# Patient Record
Sex: Male | Born: 1940 | Race: White | Hispanic: No | Marital: Married | Smoking: Former smoker
Health system: Southern US, Community
[De-identification: ages and names within clinical notes are randomized; demographics above are authoritative.]

## PROBLEM LIST (undated history)

## (undated) DIAGNOSIS — D649 Anemia, unspecified: Secondary | ICD-10-CM

## (undated) DIAGNOSIS — E109 Type 1 diabetes mellitus without complications: Secondary | ICD-10-CM

## (undated) DIAGNOSIS — E11359 Type 2 diabetes mellitus with proliferative diabetic retinopathy without macular edema: Secondary | ICD-10-CM

## (undated) DIAGNOSIS — I739 Peripheral vascular disease, unspecified: Secondary | ICD-10-CM

## (undated) DIAGNOSIS — E785 Hyperlipidemia, unspecified: Secondary | ICD-10-CM

## (undated) DIAGNOSIS — I2510 Atherosclerotic heart disease of native coronary artery without angina pectoris: Secondary | ICD-10-CM

## (undated) DIAGNOSIS — I10 Essential (primary) hypertension: Secondary | ICD-10-CM

## (undated) DIAGNOSIS — N259 Disorder resulting from impaired renal tubular function, unspecified: Secondary | ICD-10-CM

## (undated) HISTORY — DX: Hyperlipidemia, unspecified: E78.5

## (undated) HISTORY — PX: CARDIAC CATHETERIZATION: SHX172

## (undated) HISTORY — DX: Type 2 diabetes mellitus with proliferative diabetic retinopathy without macular edema: E11.359

## (undated) HISTORY — PX: CAROTID ENDARTERECTOMY: SUR193

## (undated) HISTORY — DX: Anemia, unspecified: D64.9

## (undated) HISTORY — DX: Peripheral vascular disease, unspecified: I73.9

## (undated) HISTORY — DX: Essential (primary) hypertension: I10

## (undated) HISTORY — DX: Atherosclerotic heart disease of native coronary artery without angina pectoris: I25.10

## (undated) HISTORY — DX: Type 1 diabetes mellitus without complications: E10.9

## (undated) HISTORY — PX: APPENDECTOMY: SHX54

## (undated) HISTORY — DX: Disorder resulting from impaired renal tubular function, unspecified: N25.9

---

## 1998-03-25 ENCOUNTER — Inpatient Hospital Stay (HOSPITAL_COMMUNITY): Admission: AD | Admit: 1998-03-25 | Discharge: 1998-04-02 | Payer: Self-pay | Admitting: Cardiology

## 1998-05-04 ENCOUNTER — Encounter (HOSPITAL_COMMUNITY): Admission: RE | Admit: 1998-05-04 | Discharge: 1998-08-02 | Payer: Self-pay | Admitting: Cardiovascular Disease

## 1998-05-19 ENCOUNTER — Emergency Department (HOSPITAL_COMMUNITY): Admission: EM | Admit: 1998-05-19 | Discharge: 1998-05-19 | Payer: Self-pay | Admitting: Emergency Medicine

## 1998-05-27 ENCOUNTER — Ambulatory Visit (HOSPITAL_COMMUNITY): Admission: RE | Admit: 1998-05-27 | Discharge: 1998-05-27 | Payer: Self-pay | Admitting: Endocrinology

## 1999-09-05 ENCOUNTER — Encounter: Admission: RE | Admit: 1999-09-05 | Discharge: 1999-12-04 | Payer: Self-pay | Admitting: Endocrinology

## 2000-02-03 ENCOUNTER — Ambulatory Visit (HOSPITAL_COMMUNITY): Admission: RE | Admit: 2000-02-03 | Discharge: 2000-02-03 | Payer: Self-pay | Admitting: Ophthalmology

## 2000-05-23 ENCOUNTER — Emergency Department (HOSPITAL_COMMUNITY): Admission: EM | Admit: 2000-05-23 | Discharge: 2000-05-23 | Payer: Self-pay | Admitting: Emergency Medicine

## 2000-06-01 ENCOUNTER — Encounter: Payer: Self-pay | Admitting: Endocrinology

## 2000-06-01 ENCOUNTER — Ambulatory Visit (HOSPITAL_COMMUNITY): Admission: RE | Admit: 2000-06-01 | Discharge: 2000-06-01 | Payer: Self-pay | Admitting: Endocrinology

## 2001-12-05 ENCOUNTER — Encounter: Payer: Self-pay | Admitting: Ophthalmology

## 2001-12-06 ENCOUNTER — Ambulatory Visit (HOSPITAL_COMMUNITY): Admission: RE | Admit: 2001-12-06 | Discharge: 2001-12-06 | Payer: Self-pay | Admitting: Ophthalmology

## 2003-01-07 ENCOUNTER — Encounter: Payer: Self-pay | Admitting: Nephrology

## 2003-01-07 ENCOUNTER — Inpatient Hospital Stay (HOSPITAL_COMMUNITY): Admission: EM | Admit: 2003-01-07 | Discharge: 2003-01-15 | Payer: Self-pay | Admitting: *Deleted

## 2003-01-08 ENCOUNTER — Encounter: Payer: Self-pay | Admitting: Nephrology

## 2003-01-14 ENCOUNTER — Encounter: Payer: Self-pay | Admitting: Nephrology

## 2003-01-14 ENCOUNTER — Encounter: Payer: Self-pay | Admitting: Endocrinology

## 2003-01-15 ENCOUNTER — Encounter: Payer: Self-pay | Admitting: Endocrinology

## 2004-10-26 ENCOUNTER — Ambulatory Visit: Payer: Self-pay | Admitting: Oncology

## 2004-12-06 ENCOUNTER — Ambulatory Visit: Payer: Self-pay | Admitting: Endocrinology

## 2004-12-13 ENCOUNTER — Ambulatory Visit: Payer: Self-pay | Admitting: Endocrinology

## 2004-12-28 ENCOUNTER — Ambulatory Visit: Payer: Self-pay | Admitting: Oncology

## 2005-02-18 ENCOUNTER — Emergency Department (HOSPITAL_COMMUNITY): Admission: EM | Admit: 2005-02-18 | Discharge: 2005-02-18 | Payer: Self-pay | Admitting: Emergency Medicine

## 2005-02-27 ENCOUNTER — Ambulatory Visit: Payer: Self-pay | Admitting: Oncology

## 2005-06-02 ENCOUNTER — Ambulatory Visit: Payer: Self-pay | Admitting: Oncology

## 2005-07-12 ENCOUNTER — Ambulatory Visit: Payer: Self-pay | Admitting: Endocrinology

## 2005-07-19 ENCOUNTER — Ambulatory Visit: Payer: Self-pay | Admitting: Endocrinology

## 2005-08-17 ENCOUNTER — Ambulatory Visit: Payer: Self-pay | Admitting: Endocrinology

## 2005-09-01 ENCOUNTER — Ambulatory Visit: Payer: Self-pay | Admitting: Oncology

## 2005-11-06 ENCOUNTER — Ambulatory Visit: Payer: Self-pay | Admitting: Endocrinology

## 2005-11-08 ENCOUNTER — Ambulatory Visit: Payer: Self-pay | Admitting: Endocrinology

## 2005-11-29 ENCOUNTER — Ambulatory Visit: Payer: Self-pay | Admitting: Endocrinology

## 2006-01-05 ENCOUNTER — Ambulatory Visit: Payer: Self-pay | Admitting: Endocrinology

## 2006-02-20 ENCOUNTER — Ambulatory Visit: Payer: Self-pay | Admitting: Endocrinology

## 2006-02-21 ENCOUNTER — Encounter (INDEPENDENT_AMBULATORY_CARE_PROVIDER_SITE_OTHER): Payer: Self-pay | Admitting: *Deleted

## 2006-02-21 ENCOUNTER — Ambulatory Visit: Payer: Self-pay | Admitting: Internal Medicine

## 2006-02-21 ENCOUNTER — Inpatient Hospital Stay (HOSPITAL_COMMUNITY): Admission: RE | Admit: 2006-02-21 | Discharge: 2006-02-22 | Payer: Self-pay | Admitting: Otolaryngology

## 2006-03-22 ENCOUNTER — Ambulatory Visit: Payer: Self-pay | Admitting: Endocrinology

## 2006-05-15 ENCOUNTER — Ambulatory Visit: Payer: Self-pay | Admitting: Endocrinology

## 2006-07-18 ENCOUNTER — Ambulatory Visit: Payer: Self-pay | Admitting: Endocrinology

## 2006-07-24 ENCOUNTER — Ambulatory Visit: Payer: Self-pay | Admitting: Endocrinology

## 2006-11-08 ENCOUNTER — Ambulatory Visit: Payer: Self-pay | Admitting: Endocrinology

## 2006-11-13 ENCOUNTER — Ambulatory Visit: Payer: Self-pay | Admitting: Endocrinology

## 2006-12-26 ENCOUNTER — Ambulatory Visit: Payer: Self-pay | Admitting: Endocrinology

## 2007-01-24 ENCOUNTER — Ambulatory Visit (HOSPITAL_COMMUNITY): Admission: RE | Admit: 2007-01-24 | Discharge: 2007-01-24 | Payer: Self-pay | Admitting: Internal Medicine

## 2007-01-30 ENCOUNTER — Ambulatory Visit: Payer: Self-pay | Admitting: Vascular Surgery

## 2007-02-05 ENCOUNTER — Ambulatory Visit: Payer: Self-pay | Admitting: Endocrinology

## 2007-04-08 ENCOUNTER — Ambulatory Visit: Payer: Self-pay | Admitting: Endocrinology

## 2007-07-10 ENCOUNTER — Encounter: Payer: Self-pay | Admitting: Endocrinology

## 2007-07-10 DIAGNOSIS — I10 Essential (primary) hypertension: Secondary | ICD-10-CM

## 2007-07-10 DIAGNOSIS — I2510 Atherosclerotic heart disease of native coronary artery without angina pectoris: Secondary | ICD-10-CM

## 2007-07-10 DIAGNOSIS — E109 Type 1 diabetes mellitus without complications: Secondary | ICD-10-CM

## 2007-07-10 DIAGNOSIS — E785 Hyperlipidemia, unspecified: Secondary | ICD-10-CM

## 2007-07-10 DIAGNOSIS — D649 Anemia, unspecified: Secondary | ICD-10-CM

## 2007-07-10 DIAGNOSIS — N259 Disorder resulting from impaired renal tubular function, unspecified: Secondary | ICD-10-CM

## 2007-07-10 HISTORY — DX: Atherosclerotic heart disease of native coronary artery without angina pectoris: I25.10

## 2007-07-10 HISTORY — DX: Hyperlipidemia, unspecified: E78.5

## 2007-07-10 HISTORY — DX: Anemia, unspecified: D64.9

## 2007-07-10 HISTORY — DX: Disorder resulting from impaired renal tubular function, unspecified: N25.9

## 2007-07-10 HISTORY — DX: Essential (primary) hypertension: I10

## 2007-07-10 HISTORY — DX: Type 1 diabetes mellitus without complications: E10.9

## 2007-08-08 ENCOUNTER — Ambulatory Visit: Payer: Self-pay | Admitting: Endocrinology

## 2007-08-08 LAB — CONVERTED CEMR LAB
ALT: 37 units/L (ref 0–53)
AST: 34 units/L (ref 0–37)
Albumin: 3.9 g/dL (ref 3.5–5.2)
Alkaline Phosphatase: 73 units/L (ref 39–117)
Bilirubin, Direct: 0.2 mg/dL (ref 0.0–0.3)
LDL Cholesterol: 53 mg/dL (ref 0–99)
Total Bilirubin: 1 mg/dL (ref 0.3–1.2)
Total Protein: 7.3 g/dL (ref 6.0–8.3)
Triglycerides: 60 mg/dL (ref 0–149)
VLDL: 12 mg/dL (ref 0–40)

## 2007-11-07 ENCOUNTER — Ambulatory Visit: Payer: Self-pay | Admitting: Endocrinology

## 2007-11-07 LAB — CONVERTED CEMR LAB
Basophils Relative: 0.8 % (ref 0.0–1.0)
CO2: 29 meq/L (ref 19–32)
Creatinine, Ser: 1.1 mg/dL (ref 0.4–1.5)
GFR calc non Af Amer: 71 mL/min
Glucose, Bld: 253 mg/dL — ABNORMAL HIGH (ref 70–99)
Hgb A1c MFr Bld: 9.3 % — ABNORMAL HIGH (ref 4.6–6.0)
Lymphocytes Relative: 30.7 % (ref 12.0–46.0)
MCHC: 34.9 g/dL (ref 30.0–36.0)
Monocytes Absolute: 0.6 10*3/uL (ref 0.2–0.7)
Monocytes Relative: 9.7 % (ref 3.0–11.0)
Neutro Abs: 3.6 10*3/uL (ref 1.4–7.7)
Platelets: 238 10*3/uL (ref 150–400)
RDW: 12.4 % (ref 11.5–14.6)
Sodium: 140 meq/L (ref 135–145)
Uric Acid, Serum: 5 mg/dL (ref 2.4–7.0)

## 2007-11-27 ENCOUNTER — Encounter: Payer: Self-pay | Admitting: Endocrinology

## 2008-01-15 ENCOUNTER — Encounter: Payer: Self-pay | Admitting: Endocrinology

## 2008-02-06 ENCOUNTER — Ambulatory Visit: Payer: Self-pay | Admitting: Endocrinology

## 2008-02-07 ENCOUNTER — Encounter: Payer: Self-pay | Admitting: Endocrinology

## 2008-02-12 ENCOUNTER — Encounter (INDEPENDENT_AMBULATORY_CARE_PROVIDER_SITE_OTHER): Payer: Self-pay | Admitting: Otolaryngology

## 2008-02-12 ENCOUNTER — Ambulatory Visit (HOSPITAL_COMMUNITY): Admission: RE | Admit: 2008-02-12 | Discharge: 2008-02-14 | Payer: Self-pay | Admitting: Otolaryngology

## 2008-02-18 ENCOUNTER — Encounter: Payer: Self-pay | Admitting: Endocrinology

## 2008-03-17 ENCOUNTER — Encounter: Payer: Self-pay | Admitting: Endocrinology

## 2008-04-28 ENCOUNTER — Encounter: Payer: Self-pay | Admitting: Endocrinology

## 2008-06-04 ENCOUNTER — Encounter: Payer: Self-pay | Admitting: Endocrinology

## 2008-06-09 ENCOUNTER — Encounter: Payer: Self-pay | Admitting: Endocrinology

## 2008-06-11 ENCOUNTER — Ambulatory Visit: Payer: Self-pay | Admitting: Endocrinology

## 2008-06-11 LAB — CONVERTED CEMR LAB: Hgb A1c MFr Bld: 9.5 % — ABNORMAL HIGH (ref 4.6–6.0)

## 2008-06-12 ENCOUNTER — Encounter: Payer: Self-pay | Admitting: Endocrinology

## 2008-06-23 ENCOUNTER — Encounter: Payer: Self-pay | Admitting: Endocrinology

## 2008-07-01 ENCOUNTER — Telehealth: Payer: Self-pay | Admitting: Endocrinology

## 2008-07-06 ENCOUNTER — Encounter: Payer: Self-pay | Admitting: Endocrinology

## 2008-07-17 ENCOUNTER — Encounter: Payer: Self-pay | Admitting: Endocrinology

## 2008-07-20 ENCOUNTER — Encounter: Payer: Self-pay | Admitting: Endocrinology

## 2008-07-22 ENCOUNTER — Encounter: Payer: Self-pay | Admitting: Internal Medicine

## 2008-07-27 ENCOUNTER — Encounter: Payer: Self-pay | Admitting: Endocrinology

## 2008-07-31 ENCOUNTER — Encounter: Payer: Self-pay | Admitting: Endocrinology

## 2008-09-09 ENCOUNTER — Ambulatory Visit: Payer: Self-pay | Admitting: Endocrinology

## 2008-09-16 ENCOUNTER — Encounter: Payer: Self-pay | Admitting: Endocrinology

## 2008-11-12 ENCOUNTER — Ambulatory Visit: Payer: Self-pay | Admitting: Endocrinology

## 2008-11-12 LAB — CONVERTED CEMR LAB
Hgb A1c MFr Bld: 8.6 % — ABNORMAL HIGH (ref 4.6–6.0)
TSH: 2.5 microintl units/mL (ref 0.35–5.50)

## 2008-12-02 ENCOUNTER — Encounter: Payer: Self-pay | Admitting: Endocrinology

## 2008-12-29 ENCOUNTER — Encounter: Payer: Self-pay | Admitting: Endocrinology

## 2009-01-04 ENCOUNTER — Encounter: Payer: Self-pay | Admitting: Endocrinology

## 2009-01-07 ENCOUNTER — Encounter: Payer: Self-pay | Admitting: Endocrinology

## 2009-01-07 ENCOUNTER — Telehealth: Payer: Self-pay | Admitting: Endocrinology

## 2009-02-04 ENCOUNTER — Encounter: Payer: Self-pay | Admitting: Endocrinology

## 2009-02-16 ENCOUNTER — Ambulatory Visit: Payer: Self-pay | Admitting: Endocrinology

## 2009-02-16 LAB — CONVERTED CEMR LAB: Hgb A1c MFr Bld: 9 % — ABNORMAL HIGH (ref 4.6–6.0)

## 2009-02-17 ENCOUNTER — Telehealth (INDEPENDENT_AMBULATORY_CARE_PROVIDER_SITE_OTHER): Payer: Self-pay | Admitting: *Deleted

## 2009-03-15 ENCOUNTER — Encounter: Payer: Self-pay | Admitting: Endocrinology

## 2009-03-17 ENCOUNTER — Telehealth (INDEPENDENT_AMBULATORY_CARE_PROVIDER_SITE_OTHER): Payer: Self-pay | Admitting: *Deleted

## 2009-04-07 ENCOUNTER — Encounter: Payer: Self-pay | Admitting: Endocrinology

## 2009-04-13 ENCOUNTER — Encounter: Payer: Self-pay | Admitting: Endocrinology

## 2009-04-14 ENCOUNTER — Encounter: Payer: Self-pay | Admitting: Endocrinology

## 2009-04-23 ENCOUNTER — Encounter: Payer: Self-pay | Admitting: Endocrinology

## 2009-04-23 ENCOUNTER — Telehealth (INDEPENDENT_AMBULATORY_CARE_PROVIDER_SITE_OTHER): Payer: Self-pay | Admitting: *Deleted

## 2009-06-02 ENCOUNTER — Ambulatory Visit: Payer: Self-pay | Admitting: Endocrinology

## 2009-08-04 ENCOUNTER — Encounter: Payer: Self-pay | Admitting: Endocrinology

## 2009-08-31 ENCOUNTER — Ambulatory Visit: Payer: Self-pay | Admitting: Endocrinology

## 2009-08-31 LAB — CONVERTED CEMR LAB
Cholesterol: 141 mg/dL (ref 0–200)
LDL Cholesterol: 96 mg/dL (ref 0–99)
Total CHOL/HDL Ratio: 4
Triglycerides: 59 mg/dL (ref 0.0–149.0)

## 2009-10-11 ENCOUNTER — Encounter: Payer: Self-pay | Admitting: Endocrinology

## 2009-11-08 ENCOUNTER — Encounter: Payer: Self-pay | Admitting: Endocrinology

## 2009-11-15 ENCOUNTER — Encounter: Payer: Self-pay | Admitting: Endocrinology

## 2009-11-24 ENCOUNTER — Telehealth (INDEPENDENT_AMBULATORY_CARE_PROVIDER_SITE_OTHER): Payer: Self-pay | Admitting: *Deleted

## 2009-11-30 ENCOUNTER — Ambulatory Visit: Payer: Self-pay | Admitting: Endocrinology

## 2009-11-30 LAB — CONVERTED CEMR LAB
AST: 40 units/L — ABNORMAL HIGH (ref 0–37)
Alkaline Phosphatase: 73 units/L (ref 39–117)
Basophils Absolute: 0.1 10*3/uL (ref 0.0–0.1)
Basophils Relative: 0.9 % (ref 0.0–3.0)
HDL: 40 mg/dL (ref >39.00)
Hemoglobin: 11.6 g/dL — ABNORMAL LOW (ref 13.0–17.0)
Hgb A1c MFr Bld: 8.7 % — ABNORMAL HIGH (ref 4.6–6.5)
Iron: 116 ug/dL (ref 42–165)
LDL Cholesterol: 91 mg/dL (ref 0–99)
Lymphs Abs: 1.9 10*3/uL (ref 0.7–4.0)
MCHC: 33.7 g/dL (ref 30.0–36.0)
Monocytes Relative: 10.1 % (ref 3.0–12.0)
Neutro Abs: 3.3 10*3/uL (ref 1.4–7.7)
Neutrophils Relative %: 54.7 % (ref 43.0–77.0)
RBC: 3.67 M/uL — ABNORMAL LOW (ref 4.22–5.81)
TSH: 2.75 microintl units/mL (ref 0.35–5.50)
Total Bilirubin: 0.9 mg/dL (ref 0.3–1.2)
Transferrin: 243.6 mg/dL (ref 212.0–360.0)
VLDL: 24.2 mg/dL (ref 0.0–40.0)

## 2010-01-03 ENCOUNTER — Encounter: Payer: Self-pay | Admitting: Endocrinology

## 2010-01-05 ENCOUNTER — Telehealth (INDEPENDENT_AMBULATORY_CARE_PROVIDER_SITE_OTHER): Payer: Self-pay | Admitting: *Deleted

## 2010-02-22 HISTORY — PX: CORONARY ARTERY BYPASS GRAFT: SHX141

## 2010-02-27 ENCOUNTER — Inpatient Hospital Stay (HOSPITAL_COMMUNITY): Admission: EM | Admit: 2010-02-27 | Discharge: 2010-03-02 | Payer: Self-pay | Admitting: Cardiovascular Disease

## 2010-02-27 ENCOUNTER — Encounter: Payer: Self-pay | Admitting: Emergency Medicine

## 2010-02-27 ENCOUNTER — Ambulatory Visit: Payer: Self-pay | Admitting: Diagnostic Radiology

## 2010-03-01 ENCOUNTER — Encounter (INDEPENDENT_AMBULATORY_CARE_PROVIDER_SITE_OTHER): Payer: Self-pay | Admitting: Cardiovascular Disease

## 2010-03-07 ENCOUNTER — Ambulatory Visit: Payer: Self-pay | Admitting: Endocrinology

## 2010-03-17 ENCOUNTER — Inpatient Hospital Stay (HOSPITAL_COMMUNITY): Admission: RE | Admit: 2010-03-17 | Discharge: 2010-03-18 | Payer: Self-pay | Admitting: Cardiovascular Disease

## 2010-03-30 ENCOUNTER — Encounter: Payer: Self-pay | Admitting: Endocrinology

## 2010-04-06 ENCOUNTER — Encounter (INDEPENDENT_AMBULATORY_CARE_PROVIDER_SITE_OTHER): Payer: Self-pay | Admitting: *Deleted

## 2010-04-06 LAB — CONVERTED CEMR LAB
BUN: 29 mg/dL
Calcium: 9 mg/dL
Creatinine, Ser: 1.13 mg/dL
GFR calc non Af Amer: >60 mL/min
HCT: 32.9 %
Hemoglobin: 10.8 g/dL
WBC: 5.6 10*3/uL

## 2010-04-11 ENCOUNTER — Encounter: Payer: Self-pay | Admitting: Endocrinology

## 2010-04-21 ENCOUNTER — Encounter (INDEPENDENT_AMBULATORY_CARE_PROVIDER_SITE_OTHER): Payer: Self-pay | Admitting: *Deleted

## 2010-05-05 ENCOUNTER — Ambulatory Visit: Payer: Self-pay | Admitting: Endocrinology

## 2010-05-05 DIAGNOSIS — E113599 Type 2 diabetes mellitus with proliferative diabetic retinopathy without macular edema, unspecified eye: Secondary | ICD-10-CM | POA: Insufficient documentation

## 2010-05-05 DIAGNOSIS — E11359 Type 2 diabetes mellitus with proliferative diabetic retinopathy without macular edema: Secondary | ICD-10-CM

## 2010-05-05 HISTORY — DX: Type 2 diabetes mellitus with proliferative diabetic retinopathy without macular edema: E11.359

## 2010-06-03 ENCOUNTER — Telehealth: Payer: Self-pay | Admitting: Endocrinology

## 2010-07-07 ENCOUNTER — Ambulatory Visit: Payer: Self-pay | Admitting: Endocrinology

## 2010-08-08 ENCOUNTER — Encounter: Payer: Self-pay | Admitting: Endocrinology

## 2010-08-31 ENCOUNTER — Encounter: Payer: Self-pay | Admitting: Endocrinology

## 2010-09-02 ENCOUNTER — Telehealth: Payer: Self-pay | Admitting: Endocrinology

## 2010-10-06 ENCOUNTER — Ambulatory Visit: Payer: Self-pay | Admitting: Endocrinology

## 2010-10-13 ENCOUNTER — Encounter: Payer: Self-pay | Admitting: Endocrinology

## 2010-10-28 ENCOUNTER — Encounter: Payer: Self-pay | Admitting: Endocrinology

## 2010-10-31 ENCOUNTER — Encounter: Payer: Self-pay | Admitting: Endocrinology

## 2010-11-28 ENCOUNTER — Encounter: Payer: Self-pay | Admitting: Endocrinology

## 2010-11-30 ENCOUNTER — Ambulatory Visit: Payer: Self-pay | Admitting: Endocrinology

## 2011-01-24 NOTE — Letter (Signed)
Summary: [facility]  [facility]   Imported By: Lennie Odor 11/03/2010 11:29:55  _____________________________________________________________________  External Attachment:    Type:   Image     Comment:   External Document

## 2011-01-24 NOTE — Letter (Signed)
Summary: [facility]  [facility]   Imported By: Sherian Rein 09/26/2010 14:46:14  _____________________________________________________________________  External Attachment:    Type:   Image     Comment:   External Document

## 2011-01-24 NOTE — Progress Notes (Signed)
Summary: Refill--Insulin  Phone Note Call from Patient Call back at Home Phone [phone]   Summary of Call: Patient left message on triage requesting a call back. I spoke with patient and he is out of insulin. I made patient aware that prescription was sent, but we will send again. Per patient this prescription should go to [facility]. Initial call taken by: Lucious Groves,  June 03, 2010 2:22 PM    Prescriptions: HUMALOG 100 UNIT/ML  SOLN (INSULIN LISPRO (HUMAN)) inject tid (qac) 7 units breakfast, 6 lunch, 12 dinner  #10 [place] x 11   Entered by:   Lucious Groves   Authorized by:   Minus Breeding MD   Signed by:   Lucious Groves on 06/03/2010   Method used:   Faxed to ...       [facility] [address] [phone]* (retail)       [address]       [place], [place]  [address]       Ph: [phone]       Fax: [phone]   RxID:   [id number]

## 2011-01-24 NOTE — Assessment & Plan Note (Signed)
Summary: 2 mth fu   stc   Vital Signs:  Patient profile:   70 year old male Height:      70 inches (177.80 cm) Weight:      162.38 pounds (73.81 kg) BMI:     23.38 O2 Sat:      98 % on Room air Temp:     97.9 degrees F (36.61 degrees C) oral Pulse rate:   59 / minute Pulse rhythm:   regular BP sitting:   128 / 68  (left arm) Cuff size:   regular  Vitals Entered By: Brenton Grills MA (July 07, 2010 8:14 AM)  O2 Flow:  Room air CC: 2 month f/u/Pt is no longer taking Pantoprazole Sodium/aj   CC:  2 month f/u/Pt is no longer taking Pantoprazole Sodium/aj.  History of Present Illness: pt states he feels well in general.  he brings a record of his cbg's which i have reviewed today.  almost all are in the 100's.  he has some cbg's below at all times of day, except for hs.  he takes only 6 units of humalog with breakfast if he is going to be active.    Current Medications (verified): 1)  Lantus 100 Unit/ml  Soln (Insulin Glargine) .... 21 Units At Night 2)  Folbee Plus   Tabs (B Complex-C-Folic Acid) .... Take 1 By Mouth Qd 3)  Prilosec 20 Mg  Cpdr (Omeprazole) .... Take 1 By Mouth Qd 4)  A1c 250.53 5)  Humalog 100 Unit/ml  Soln (Insulin Lispro (Human)) .... Inject Tid (Qac) 7 Units Breakfast, 6 Lunch, 12 Dinner 6)  Coreg 25 Mg Tabs (Carvedilol) .... Take 1 Two Times A Day 7)  Hydralazine Hcl 25 Mg Tabs (Hydralazine Hcl) .... Take 3 By Mouth Qd 8)  Clonidine Hcl 0.1 Mg Tabs (Clonidine Hcl) .... Take 4 By Mouth Two Times A Day 9)  Vitamin D 1000 Unit  Caps (Ergocalciferol) .... Take 2 Daily 10)  Isosorbide Mononitrate Cr 60 Mg Xr24h-Tab .Marland Kitchen.. 1 Tablet Daily 11)  Pantoprazole Sodium 40 Mg Tbec (Pantoprazole Sodium) .Marland Kitchen.. 1 Tab Daily 12)  Aspirin 81 Mg Tbec (Aspirin) .Marland Kitchen.. 1 Tab Daily  Allergies (verified): 1)  ! Penicillin 2)  ! Codeine 3)  ! Diovan 4)  ! Norvasc  Past History:  Past Medical History: Last updated: 07/10/2007 Anemia-NOS Coronary artery disease Diabetes  mellitus, type I Hyperlipidemia Hypertension Renal insufficiency DM Retinopathy  Review of Systems  The patient denies hypoglycemia.    Physical Exam  General:  normal appearance.   Skin:  insulin injection sites at anterior abdomen are normal    Impression & Recommendations:  Problem # 1:  DIABETES MELLITUS, TYPE I (ICD-250.01) considering duration of disease, age, and comorbidity, he is doing very well.  Medications Added to Medication List This Visit: 1)  Humalog 100 Unit/ml Soln (Insulin lispro (human)) .... Inject tid (qac) 7 units breakfast, 6 lunch, 13 dinner 2)  Aspirin 81 Mg Tbec (Aspirin) .Marland Kitchen.. 1 tab daily  Other Orders: TLB-A1C / Hgb A1C (Glycohemoglobin) (83036-A1C) Est. Patient Level III ([address])  Patient Instructions: 1)  blood tests are being ordered for you today.  please call [phone] to hear your test results. 2)  pending the test results, please continue lantus 21 units at night, and increase humalog (just before each meal) 06-29-12 3)  if exercise is anticipated, subtract 2 units humalog 4)  if 200-249, add 1 unit humalog.  if 250 or over, take 2 extra units.  5)  Please schedule a medicare "wellness" appointment in 1 month. 6)  check your blood sugar 6 times a day--before the 3 meals, at bedtime, and 2 other random times per day.  also check if you have symptoms of your blood sugar being too high or too low.  please keep a record of the readings and bring it to your next appointment here.  please call us sooner if you are having low blood sugar episodes. 7)  Please schedule a follow-up appointment in 3 months.  Appended Document: 2 mth fu   stc should read "below 100 at all times"

## 2011-01-24 NOTE — Assessment & Plan Note (Signed)
Summary: 3 MTH FU  STC   Vital Signs:  Patient profile:   70 year old male Height:      70 inches (177.80 cm) Weight:      158.50 pounds (72.05 kg) BMI:     22.82 O2 Sat:      99 % on Room air Temp:     98.0 degrees F (36.67 degrees C) oral Pulse rate:   58 / minute BP sitting:   118 / 56  (left arm) Cuff size:   regular  Vitals Entered By: Brenton Grills MA (October 06, 2010 8:07 AM)  O2 Flow:  Room air CC: 3 month F/U/pt is no longer taking Hydralazine/aj Is Patient Diabetic? Yes   CC:  3 month F/U/pt is no longer taking Hydralazine/aj.  History of Present Illness: pt states he feels well in general.  he brings an extensive record of his cbg's which i have reviewed today.  it varies from approx 80-240.  it is highest at hs, then supper, then am, then lowest before lunch.    Current Medications (verified): 1)  Lantus 100 Unit/ml  Soln (Insulin Glargine) .... 21 Units At Night 2)  Folbee Plus   Tabs (B Complex-C-Folic Acid) .... Take 1 By Mouth Qd 3)  Prilosec 20 Mg  Cpdr (Omeprazole) .... Take 1 By Mouth Qd 4)  A1c 250.53 5)  Humalog 100 Unit/ml  Soln (Insulin Lispro (Human)) .... Inject Tid (Qac) 7 Units Breakfast, 6 Lunch, 13 Dinner 6)  Coreg 25 Mg Tabs (Carvedilol) .... Take 1 Two Times A Day 7)  Hydralazine Hcl 25 Mg Tabs (Hydralazine Hcl) .... Take 3 By Mouth Qd 8)  Clonidine Hcl 0.1 Mg Tabs (Clonidine Hcl) .... Take 1 By Mouth Once Daily 9)  Vitamin D 2000 Unit Caps (Cholecalciferol) .... Take 2 By Mouth Daily 10)  Isosorbide Mononitrate Cr 60 Mg Xr24h-Tab .Marland Kitchen.. 1 Tablet Daily 11)  Aspirin 81 Mg Tbec (Aspirin) .Marland Kitchen.. 1 Tab Daily 12)  Hydrochlorothiazide 25 Mg Tabs (Hydrochlorothiazide) .Marland Kitchen.. 1 By Mouth Once Daily  Allergies (verified): 1)  ! Penicillin 2)  ! Codeine 3)  ! Diovan 4)  ! Norvasc  Past History:  Past Medical History: Last updated: 07/10/2007 Anemia-NOS Coronary artery disease Diabetes mellitus, type I Hyperlipidemia Hypertension Renal  insufficiency DM Retinopathy  Social History: Reviewed history from 05/05/2010 and no changes required. retired married non-smoker no alcohol no illicit drugs says his diet and exercise are very good  Review of Systems  The patient denies hypoglycemia.    Physical Exam  General:  normal appearance.   Pulses:  dorsalis pedis intact bilat. Extremities:  no deformity.  no ulcer on the feet.  feet are of normal color and temp.   trace right pedal edema and trace left pedal edema.  there is a healed vein harvest scar at the left leg and thigh  Neurologic:  cn 2-12 grossly intact.   readily moves all 4's.   sensation is intact to touch on the feet  Additional Exam:  Hemoglobin A1C       [H]  9.7 %    Impression & Recommendations:  Problem # 1:  DIABETES MELLITUS, TYPE I (ICD-250.01) needs increased rx  Medications Added to Medication List This Visit: 1)  Lantus 100 Unit/ml Soln (Insulin glargine) .... 20 units at night 2)  Humalog 100 Unit/ml Soln (Insulin lispro (human)) .... Inject three times a day (just before each meal), 7 units breakfast, 7 lunch, 14 dinner 3)  Clonidine Hcl  0.1 Mg Tabs (Clonidine hcl) .... Take 1 by mouth once daily 4)  Vitamin D 2000 Unit Caps (Cholecalciferol) .... Take 2 by mouth daily 5)  Hydrochlorothiazide 25 Mg Tabs (Hydrochlorothiazide) .Marland Kitchen.. 1 by mouth once daily  Other Orders: TLB-A1C / Hgb A1C (Glycohemoglobin) (83036-A1C) Est. Patient Level III (24401)  Patient Instructions: 1)  blood tests are being ordered for you today.  please call [phone] to hear your test results. 2)  pending the test results, please reduce lantus to 20 units at night, and increase humalog (just before each meal) 06-30-14 3)  if exercise is anticipated, subtract 2 units humalog 4)  if 200-249, add 1 unit humalog.  if 250 or over, take 2 extra units.   5)  Please schedule a medicare "wellness" appointment in 1 month. 6)  check your blood sugar 6 times a day--before  the 3 meals, at bedtime, and 2 other random times per day.  also check if you have symptoms of your blood sugar being too high or too low.  please keep a record of the readings and bring it to your next appointment here.  please call us sooner if you are having low blood sugar episodes. 7)  Please schedule a follow-up appointment in 3 months. 8)  (update: i left message on phone-tree:  rx as we discussed, except ret 6 weeks).

## 2011-01-24 NOTE — Progress Notes (Signed)
Summary: lantus  Phone Note Refill Request Message from:  Pharmacy on September 02, 2010 10:32 AM  Refills Requested: Medication #1:  LANTUS 100 UNIT/ML  SOLN 21 units at night   Dosage confirmed as above?Dosage Confirmed  Method Requested: Electronic Initial call taken by: Brenton Grills MA,  September 02, 2010 10:32 AM    Prescriptions: LANTUS 100 UNIT/ML  SOLN (INSULIN GLARGINE) 21 units at night  #1 month x 3   Entered by:   Brenton Grills MA   Authorized by:   Minus Breeding MD   Signed by:   Brenton Grills MA on 09/02/2010   Method used:   Electronically to        [facility] [phone]* (retail)       [address]       [place], [place]  [address]       Ph: [phone]       Fax: [phone]   RxID:   [id number]

## 2011-01-24 NOTE — Letter (Signed)
Summary: Ocular eval/[facility]  Ocular eval/[facility]   Imported By: Lester [place] 11/07/2010 09:17:04  _____________________________________________________________________  External Attachment:    Type:   Image     Comment:   External Document

## 2011-01-24 NOTE — Assessment & Plan Note (Signed)
Summary: YEARLY FU/MEDICARE/ LABS AFTER/NWS  #   Vital Signs:  Patient profile:   70 year old male Height:      70 inches (177.80 cm) Weight:      159.25 pounds (72.39 kg) O2 Sat:      97 % on Room air Temp:     97.5 degrees F (36.39 degrees C) oral Pulse rate:   59 / minute BP sitting:   144 / 94  (left arm) Cuff size:   regular  Vitals Entered By: Josph Macho RMA (May 05, 2010 8:02 AM)  O2 Flow:  Room air CC: Yearly follow up/ pt is no longer taking Prilosec/  Is Patient Diabetic? Yes  Vision Screening:Left eye w/o correction: 20 / 70 Right Eye w/o correction: 20 / 50 Left eye with correction: 20 / 30 Right eye with correction: 20 / 25        Vision Entered By: Josph Macho RMA (May 05, 2010 8:35 AM)   CC:  Yearly follow up/ pt is no longer taking Prilosec/ .  History of Present Illness: pt is here for medicare welllness visit.  he denies memory loss and depression (medicare depression screen is neg).  he says she is able to perform activities of daily living without assistance. he brings a record of his cbg's which i have reviewed today.  he has mild hypoglycemia before lunch.  he has hypoglycemia with exertion, even with subtracting 1 unit humalog.    Current Medications (verified): 1)  Lantus 100 Unit/ml  Soln (Insulin Glargine) .... 21 Units At Night 2)  Folbee Plus   Tabs (B Complex-C-Folic Acid) .... Take 1 By Mouth Qd 3)  Prilosec 20 Mg  Cpdr (Omeprazole) .... Take 1 By Mouth Qd 4)  A1c 250.53 5)  Humalog 100 Unit/ml  Soln (Insulin Lispro (Human)) .... Inject Tid (Qac) 8 Units Breakfast, 6 Lunch, 12 Dinner 6)  Coreg 25 Mg Tabs (Carvedilol) .... Take 1 Two Times A Day 7)  Hydralazine Hcl 25 Mg Tabs (Hydralazine Hcl) .... Take 3 By Mouth Qd 8)  Clonidine Hcl 0.1 Mg Tabs (Clonidine Hcl) .... Take 4 By Mouth Two Times A Day 9)  Vitamin D 1000 Unit  Caps (Ergocalciferol) .... Take 2 Daily 10)  Isosorbide Mononitrate Cr 60 Mg Xr24h-Tab .Marland Kitchen.. 1 Tablet  Daily 11)  Pantoprazole Sodium 40 Mg Tbec (Pantoprazole Sodium) .Marland Kitchen.. 1 Tab Daily 12)  Aspir-Trin 325 Mg Tbec (Aspirin) .Marland Kitchen.. 1 Tab Daily  Allergies (verified): 1)  ! Penicillin 2)  ! Codeine 3)  ! Diovan 4)  ! Norvasc  Past History:  Past Medical History: Last updated: 07/10/2007 Anemia-NOS Coronary artery disease Diabetes mellitus, type I Hyperlipidemia Hypertension Renal insufficiency DM Retinopathy  Family History: Reviewed history from 09/09/2008 and no changes required. no cancer  Social History: Reviewed history from 09/09/2008 and no changes required. retired married non-smoker no alcohol no illicit drugs says his diet and exercise are very good  Review of Systems  The patient denies fever, weight loss, weight gain, vision loss, decreased hearing, chest pain, syncope, dyspnea on exertion, prolonged cough, headaches, abdominal pain, melena, hematochezia, severe indigestion/heartburn, and suspicious skin lesions.    Physical Exam  Neck:  Supple without thyroid enlargement or tenderness. there is a healed right carotid endarterectomy scar Chest Wall:  healed median sternotomy scar. Lungs:  Clear to auscultation bilaterally. Normal respiratory effort.  Heart:  Regular rate and rhythm without murmurs or gallops noted. Normal S1,S2.   Abdomen:  abdomen is soft,  nontender.  no hepatosplenomegaly.   not distended.  no hernia  Rectal:  normal external and internal exam.  heme neg  Prostate:  Normal size prostate without masses or tenderness.  Msk:  muscle bulk and strength are grossly normal.  no obvious joint swelling.  gait is normal and steady. easily performs "get up and go" without unsteadiness.  Pulses:  dorsalis pedis intact bilat.  there is a left carotid bruit (pt says doppler was neg) Extremities:  no deformity.  no ulcer on the feet.  feet are of normal color and temp.   trace right pedal edema and trace left pedal edema.  there is a healed vein  harvest scar at the left leg and thigh  Neurologic:  cn 2-12 grossly intact.   readily moves all 4's.   sensation is intact to touch on the feet  Skin:  normal texture and temp.  no rash.  not diaphoretic  Cervical Nodes:  No significant adenopathy.  Psych:  oriented x 3 easily reads and writes sentences remembers 2/3 at 5 minutes.   Impression & Recommendations:  Problem # 1:  ROUTINE GENERAL MEDICAL EXAM@HEALTH  CARE FACL (ICD-V70.0)  Problem # 2:  DIABETES MELLITUS, TYPE I (ICD-250.01) needs less breakfast humalog  Medications Added to Medication List This Visit: 1)  Humalog 100 Unit/ml Soln (Insulin lispro (human)) .... Inject tid (qac) 7 units breakfast, 6 lunch, 12 dinner 2)  Hydralazine Hcl 25 Mg Tabs (Hydralazine hcl) .... Take 3 by mouth qd 3)  Isosorbide Mononitrate Cr 60 Mg Xr24h-tab  .Marland Kitchen.. 1 tablet daily 4)  Pantoprazole Sodium 40 Mg Tbec (Pantoprazole sodium) .Marland Kitchen.. 1 tab daily 5)  Aspir-trin 325 Mg Tbec (Aspirin) .Marland Kitchen.. 1 tab daily  Other Orders: Subsequent annual wellness visit with prevention plan (J8119)  Preventive Care Screening     colonoscopy refused 2011   Patient Instructions: 1)  please consider these measures for your health:  minimize alcohol.  do not use tobacco products.  have a colonoscopy at least every 10 years from age 10.  keep firearms safely stored.  always use seat belts.  have working smoke alarms in your home.  see the dentist regularly.  never drive under the influence of alcohol or drugs (including prescription drugs).  those with fair skin should take precautions against the sun. 2)  please let me know what your wishes would be, if artificial life support measures should become necessary.  it is critically important to prevent falling down (keep floor areas well-lit, dry, and free of loose objects). 3)  controlling your blood pressure and cholesterol drastically reduces the damage diabetes does to your body.  this also applies to quitting  smoking.  please discuss these with your doctor.  you should take an aspirin every day, unless you have been advised by a doctor not to. 4)  good diet and exercise habits significanly improve the control of your diabetes.  please let me know if you wish to be referred to a dietician.  high blood sugar is very risky to your health.  you should see an eye doctor every year. 5)  please consider insulin pump therapy and continuous glucose monitor 6)  continue lantus 21 units at night. 7)  reduce humalog to (just before each meal) 06-30-11 8)  if exercise is anticipated, subtract 2 units humalog 9)  if 200-249, add 1 unit humalog.  if 250 or over, take 2 extra units.   10)  Please schedule a medicare "wellness" appointment in 1  month. 11)  check your blood sugar 6 times a day--before the 3 meals, at bedtime, and 2 other random times per day.  also check if you have symptoms of your blood sugar being too high or too low.  please keep a record of the readings and bring it to your next appointment here.  please call us sooner if you are having low blood sugar episodes. 12)  Please schedule a follow-up appointment in 2 months.   Prevention & Chronic Care Immunizations   Influenza vaccine: Not documented    Tetanus booster: 06/09/2004: Td    Pneumococcal vaccine: Pneumovax  (06/09/2004)    H. zoster vaccine: Not documented  Colorectal Screening   Hemoccult: Not documented    Colonoscopy: Not documented  Other Screening   PSA: 0.74  (11/30/2009)   Smoking status: current  (07/10/2007)  Diabetes Mellitus   HgbA1C: 8.7  (11/30/2009)    Eye exam: Not documented    Foot exam: Not documented   High risk foot: Not documented   Foot care education: Not documented    Urine microalbumin/creatinine ratio: Not documented  Lipids   Total Cholesterol: 155  (11/30/2009)   LDL: 91  (11/30/2009)   LDL Direct: Not documented   HDL: 40.00  (11/30/2009)   Triglycerides: 121.0  (11/30/2009)    SGOT  (AST): 40  (11/30/2009)   SGPT (ALT): 49  (11/30/2009)   Alkaline phosphatase: 73  (11/30/2009)   Total bilirubin: 0.9  (11/30/2009)  Hypertension   Last Blood Pressure: 144 / 94  (05/05/2010)   Serum creatinine: 1.13  (04/06/2010)   Serum potassium 4.3  (11/07/2007)  Self-Management Support :    Diabetes self-management support: Not documented    Hypertension self-management support: Not documented    Lipid self-management support: Not documented     Preventive Care Screening     colonoscopy refused 2011

## 2011-01-24 NOTE — Letter (Signed)
Summary: [facility]   Imported By: Lester [place] 04/26/2010 09:51:46  _____________________________________________________________________  External Attachment:    Type:   Image     Comment:   External Document

## 2011-01-24 NOTE — Letter (Signed)
Summary: [facility]  [facility]   Imported By: Sherian Rein 09/27/2010 13:33:34  _____________________________________________________________________  External Attachment:    Type:   Image     Comment:   External Document

## 2011-01-24 NOTE — Assessment & Plan Note (Signed)
Summary: post hospital /f/u 1 wk per pt/#/cd   Vital Signs:  Patient profile:   69 year old male Height:      70 inches (177.80 cm) Weight:      154.25 pounds (70.11 kg) O2 Sat:      97 % on Room air Temp:     97.2 degrees F (36.22 degrees C) oral Pulse rate:   61 / minute BP sitting:   162 / 50  (left arm) Cuff size:   regular  Vitals Entered By: Josph Macho RMA (March 07, 2010 1:16 PM)  O2 Flow:  Room air CC: Post Hospital follow up/ pt states he is no longer taking HCTZ or Crestor/ CF Is Patient Diabetic? Yes   CC:  Post Hospital follow up/ pt states he is no longer taking HCTZ or Crestor/ CF.  History of Present Illness: pt will have ptca with stent next week. no cbg record, but states cbg's are highest in am (200).  it is higher than at hs, despite no hs-snack.    Current Medications (verified): 1)  Lantus 100 Unit/ml  Soln (Insulin Glargine) .Marland Kitchen.. 10 Units Bid 2)  Hydrochlorothiazide 25 Mg  Tabs (Hydrochlorothiazide) .... Take 1 By Mouth Qd 3)  Folbee Plus   Tabs (B Complex-C-Folic Acid) .... Take 1 By Mouth Qd 4)  Prilosec 20 Mg  Cpdr (Omeprazole) .... Take 1 By Mouth Qd 5)  A1c 250.53 6)  Humalog 100 Unit/ml  Soln (Insulin Lispro (Human)) .... Inject Tid (Qac) 8 Units Breakfast, 6 Lunch, 12 Dinner 7)  Coreg 25 Mg Tabs (Carvedilol) .... Take 1 Two Times A Day 8)  Hydralazine Hcl 25 Mg Tabs (Hydralazine Hcl) .... Take 2 By Mouth Qd 9)  Clonidine Hcl 0.1 Mg Tabs (Clonidine Hcl) .... Take 4 By Mouth Two Times A Day 10)  Vitamin D 1000 Unit  Caps (Ergocalciferol) .... Take 2 Daily 11)  Crestor 10 Mg Tabs (Rosuvastatin Calcium) .Marland Kitchen.. 1 Qd 12)  Isosorbide Mononitrate Cr 30 Mg Xr24h-Tab (Isosorbide Mononitrate) .Marland Kitchen.. 1 Tablet Daily  Allergies (verified): 1)  ! Penicillin 2)  ! Codeine 3)  ! Diovan 4)  ! Norvasc  Past History:  Past Medical History: Last updated: 07/10/2007 Anemia-NOS Coronary artery disease Diabetes mellitus, type  I Hyperlipidemia Hypertension Renal insufficiency DM Retinopathy  Review of Systems  The patient denies hypoglycemia.    Physical Exam  General:  normal appearance.   Skin:  insulin injection sites at anterior abdomen are normal  Additional Exam:  a1c in the hospital was 9.0   Impression & Recommendations:  Problem # 1:  DIABETES MELLITUS, TYPE I (ICD-250.01) i want to avoid hypoglycemia in view of his coronary artery disease, but he needs increased rx  Medications Added to Medication List This Visit: 1)  Lantus 100 Unit/ml Soln (Insulin glargine) .... 21 units at night 2)  Clonidine Hcl 0.1 Mg Tabs (Clonidine hcl) .... Take 4 by mouth two times a day 3)  Vitamin D 1000 Unit Caps (ergocalciferol)  .... Take 2 daily 4)  Isosorbide Mononitrate Cr 30 Mg Xr24h-tab (Isosorbide mononitrate) .Marland Kitchen.. 1 tablet daily  Other Orders: Est. Patient Level III (14782)  Patient Instructions: 1)  please consider insulin pump therapy and continuous glucose monitor 2)  change lantus to 21 units at night. 3)  continue humalog to (just before each meal) 07-31-11 4)  if exercise is anticipated, subtract 1 unit humalog 5)  if 200-249, add 1 unit humalog.  if 250 or over, take  2 extra units.   6)  Please schedule a medicare "wellness" appointment in 1 month. 7)  check your blood sugar 6 times a day--before the 3 meals, at bedtime, and 2 other random times per day.  also check if you have symptoms of your blood sugar being too high or too low.  please keep a record of the readings and bring it to your next appointment here.  please call us sooner if you are having low blood sugar episodes.

## 2011-01-24 NOTE — Progress Notes (Signed)
Summary: Medical Records from [facility]  Phone Note From Other Clinic   Summary of Call: Medical Records received from [facility]. Put on MD's desk. Initial call taken by: Josph Macho CMA,  January 05, 2010 11:07 AM

## 2011-01-24 NOTE — Letter (Signed)
Summary: [facility]    Imported By: Sherian Rein 10/19/2010 07:19:11  _____________________________________________________________________  External Attachment:    Type:   Image     Comment:   External Document

## 2011-01-25 ENCOUNTER — Encounter: Payer: Self-pay | Admitting: Endocrinology

## 2011-01-26 NOTE — Assessment & Plan Note (Signed)
Summary: PER PT 6 WK FU--STC   Vital Signs:  Patient profile:   70 year old male Height:      70 inches (177.80 cm) Weight:      160.13 pounds (72.79 kg) BMI:     23.06 O2 Sat:      97 % on Room air Temp:     97.9 degrees F (36.61 degrees C) oral Pulse rate:   63 / minute Pulse rhythm:   regular BP sitting:   106 / 68  (left arm) Cuff size:   regular  Vitals Entered By: Brenton Grills CMA Duncan Dull) (November 30, 2010 8:07 AM)  O2 Flow:  Room air CC: 6 week F/U/aj Is Patient Diabetic? Yes   CC:  6 week F/U/aj.  History of Present Illness: pt states he feels well in general.  he brings a record of his cbg's which i have reviewed today.  it varies from 90-170 at all times of day.  it is lower with activity.    Current Medications (verified): 1)  Lantus 100 Unit/ml  Soln (Insulin Glargine) .... 20 Units At Night 2)  Folbee Plus   Tabs (B Complex-C-Folic Acid) .... Take 1 By Mouth Qd 3)  Prilosec 20 Mg  Cpdr (Omeprazole) .... Take 1 By Mouth Qd 4)  A1c 250.53 5)  Humalog 100 Unit/ml  Soln (Insulin Lispro (Human)) .... Inject Three Times A Day (Just Before Each Meal), 7 Units Breakfast, 7 Lunch, 14 Dinner 6)  Coreg 25 Mg Tabs (Carvedilol) .... Take 1 Two Times A Day 7)  Clonidine Hcl 0.1 Mg Tabs (Clonidine Hcl) .... Take 1 By Mouth Once Daily 8)  Vitamin D 2000 Unit Caps (Cholecalciferol) .... Take 2 By Mouth Daily 9)  Isosorbide Mononitrate Cr 60 Mg Xr24h-Tab .Marland Kitchen.. 1 Tablet Daily 10)  Aspirin 81 Mg Tbec (Aspirin) .Marland Kitchen.. 1 Tab Daily 11)  Hydrochlorothiazide 25 Mg Tabs (Hydrochlorothiazide) .Marland Kitchen.. 1 By Mouth Once Daily  Allergies (verified): 1)  ! Penicillin 2)  ! Codeine 3)  ! Diovan 4)  ! Norvasc  Past History:  Past Medical History: Last updated: 07/10/2007 Anemia-NOS Coronary artery disease Diabetes mellitus, type I Hyperlipidemia Hypertension Renal insufficiency DM Retinopathy  Review of Systems  The patient denies syncope.    Physical Exam  General:  normal  appearance.   Psych:  Alert and cooperative; normal mood and affect; normal attention span and concentration.     Impression & Recommendations:  Problem # 1:  DIABETES MELLITUS, TYPE I (ICD-250.01)  Medications Added to Medication List This Visit: 1)  Humalog 100 Unit/ml Soln (Insulin lispro (human)) .... Inject three times a day (just before each meal), 8 units breakfast, 8 lunch, 15 dinner  Other Orders: Est. Patient Level III (04540)  Patient Instructions: 1)  please continue lantus to 20 units at night, and increase humalog (just before each meal) 08-02-15.   2)  if exercise is anticipated, subtract 4 units humalog.  if you cannot anticipate the exertion, eat a light snack with the exertion.   3)  if 200-249, add 1 unit humalog.  if 250 or over, take 2 extra units.   4)  Please schedule a medicare "wellness" appointment in 1 month. 5)  check your blood sugar 6 times a day--before the 3 meals, at bedtime, and 2 other random times per day.  also check if you have symptoms of your blood sugar being too high or too low.  please keep a record of the readings and bring it  to your next appointment here.  please call us sooner if you are having low blood sugar episodes.   6)  Please schedule a follow-up appointment in 2 mos. Prescriptions: HUMALOG 100 UNIT/ML  SOLN (INSULIN LISPRO (HUMAN)) inject three times a day (just before each meal), 8 units breakfast, 8 lunch, 15 dinner  #1 vial x 11   Entered and Authorized by:   Minus Breeding MD   Signed by:   Minus Breeding MD on 11/30/2010   Method used:   Print then Give to Patient   RxID:   [id number] HUMALOG 100 UNIT/ML  SOLN (INSULIN LISPRO (HUMAN)) inject three times a day (just before each meal), 8 units breakfast, 8 lunch, 15 dinner  #1 vial x 11   Entered and Authorized by:   Minus Breeding MD   Signed by:   Minus Breeding MD on 11/30/2010   Method used:   Print then Give to Patient   RxID:   [id number]    Orders Added: 1)   Est. Patient Level III [09381]

## 2011-01-26 NOTE — Letter (Signed)
Summary: [facility]  [facility]   Imported By: Lester Meadow Glade 12/13/2010 10:03:44  _____________________________________________________________________  External Attachment:    Type:   Image     Comment:   External Document

## 2011-01-27 NOTE — Letter (Signed)
Summary: [facility]  [facility]   Imported By: Lanelle Bal 04/08/2010 11:22:00  _____________________________________________________________________  External Attachment:    Type:   Image     Comment:   External Document

## 2011-01-27 NOTE — Letter (Signed)
Summary: [facility]   Imported By: Sherian Rein 01/19/2010 10:56:10  _____________________________________________________________________  External Attachment:    Type:   Image     Comment:   External Document

## 2011-02-01 ENCOUNTER — Ambulatory Visit (INDEPENDENT_AMBULATORY_CARE_PROVIDER_SITE_OTHER): Payer: Medicare Other | Admitting: Endocrinology

## 2011-02-01 ENCOUNTER — Other Ambulatory Visit: Payer: Self-pay | Admitting: Endocrinology

## 2011-02-01 ENCOUNTER — Other Ambulatory Visit: Payer: Medicare Other

## 2011-02-01 ENCOUNTER — Encounter (INDEPENDENT_AMBULATORY_CARE_PROVIDER_SITE_OTHER): Payer: Self-pay | Admitting: *Deleted

## 2011-02-01 ENCOUNTER — Encounter: Payer: Self-pay | Admitting: Endocrinology

## 2011-02-01 DIAGNOSIS — M24549 Contracture, unspecified hand: Secondary | ICD-10-CM

## 2011-02-01 DIAGNOSIS — E109 Type 1 diabetes mellitus without complications: Secondary | ICD-10-CM

## 2011-02-01 LAB — HEMOGLOBIN A1C: Hgb A1c MFr Bld: 9.6 % — ABNORMAL HIGH (ref 4.6–6.5)

## 2011-02-09 NOTE — Assessment & Plan Note (Signed)
Summary: 2 mth fu--stc   Vital Signs:  Patient profile:   70 year old male Height:      70 inches (177.80 cm) Weight:      159.75 pounds (72.61 kg) BMI:     23.00 O2 Sat:      97 % on Room air Temp:     97.9 degrees F (36.61 degrees C) oral Pulse rate:   65 / minute Pulse rhythm:   regular BP sitting:   128 / 74  (left arm) Cuff size:   regular  Vitals Entered By: Brenton Grills CMA (AAMA) (February 01, 2011 8:20 AM)  O2 Flow:  Room air CC: 2 month F/U/pt declined flu shot/aj Is Patient Diabetic? Yes Comments Pt has never had a colonoscopy   CC:  2 month F/U/pt declined flu shot/aj.  History of Present Illness: pt states he feels well in general.  he brings a record of his cbg's which i have reviewed today.  it varies from 90-170 at all times of day.  it is lower with activity, which he is seldom able to anticipate.  he says if hs-cbg is below 150, he has to eat an hs-snack to prevent hypoglycemia in the early am.     pt states 1 year of right hand "hanging up," and assoc pain.    Current Medications (verified): 1)  Lantus 100 Unit/ml  Soln (Insulin Glargine) .... 20 Units At Night 2)  Folbee Plus   Tabs (B Complex-C-Folic Acid) .... Take 1 By Mouth Qd 3)  Prilosec 20 Mg  Cpdr (Omeprazole) .... Take 1 By Mouth Qd 4)  A1c 250.53 5)  Humalog 100 Unit/ml  Soln (Insulin Lispro (Human)) .... Inject Three Times A Day (Just Before Each Meal), 8 Units Breakfast, 8 Lunch, 15 Dinner 6)  Coreg 25 Mg Tabs (Carvedilol) .... Take 1 Two Times A Day 7)  Clonidine Hcl 0.1 Mg Tabs (Clonidine Hcl) .... Take 1/2 Tablet By Mouth Two Times A Day 8)  Vitamin D 2000 Unit Caps (Cholecalciferol) .... Take 2 By Mouth Daily 9)  Isosorbide Mononitrate Cr 60 Mg Xr24h-Tab .Marland Kitchen.. 1 Tablet Daily 10)  Aspirin 81 Mg Tbec (Aspirin) .Marland Kitchen.. 1 Tab Daily 11)  Hydrochlorothiazide 25 Mg Tabs (Hydrochlorothiazide) .Marland Kitchen.. 1 By Mouth Once Daily  Allergies (verified): 1)  ! Penicillin 2)  ! Codeine 3)  ! Diovan 4)  !  Norvasc  Past History:  Past Medical History: Last updated: 07/10/2007 Anemia-NOS Coronary artery disease Diabetes mellitus, type I Hyperlipidemia Hypertension Renal insufficiency DM Retinopathy  Review of Systems  The patient denies weight loss and weight gain.    Physical Exam  Msk:  right hand:  multiple flexion contractures   Impression & Recommendations:  Problem # 1:  DIABETES MELLITUS, TYPE I (ICD-250.01) control is complicated by long duration of dm, and comorbidities.  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 19 units at night 2)  Clonidine Hcl 0.1 Mg Tabs (Clonidine hcl) .... Take 1/2 tablet by mouth two times a day  Other Orders: TLB-A1C / Hgb A1C (Glycohemoglobin) (83036-A1C) Orthopedic Surgeon Referral (Ortho Surgeon) Est. Patient Level IV (54098)  Patient Instructions: 1)  please reduce lantus to 19 units at night, and increase humalog (just before each meal) 08-02-15.   2)  the goal of this reducetion of lantus, is to remove the need for a bedtime snack. 3)  if you are able to anticipate exercise, subtract 4 units humalog.  if you cannot anticipate the exertion, eat a light snack with the exertion.   4)  if 200-249, add 1 unit humalog.  if 250 or over, take 2 extra units.   5)  Please schedule a medicare "wellness" appointment in 1 month. 6)  check your blood sugar 6 times a day--before the 3 meals, at bedtime, and 2 other random times per day.  also check if you have symptoms of your blood sugar being too high or too low.  please keep a record of the readings and bring it to your next appointment here.  please call us sooner if you are having low blood sugar episodes.   7)  Please schedule a "medicare wellness" appointment in 3 mos. 8)  you should consider a continuous blood sugar monitor.  call if you want to pursue this.     9)  refer to a hand specialist.  you will be called with a day and time for an appointment   Orders Added: 1)  TLB-A1C / Hgb A1C (Glycohemoglobin) [83036-A1C] 2)  Orthopedic Surgeon Referral [Ortho Surgeon] 3)  Est. Patient Level IV [13086]

## 2011-02-09 NOTE — Letter (Signed)
Summary: [facility]   [facility]   Imported By: Sherian Rein 02/01/2011 14:31:41  _____________________________________________________________________  External Attachment:    Type:   Image     Comment:   External Document

## 2011-02-27 ENCOUNTER — Telehealth: Payer: Self-pay | Admitting: Endocrinology

## 2011-03-07 NOTE — Progress Notes (Signed)
Summary: Call Report  Phone Note Other Incoming   Caller: [facility] Summary of Call: [facility] Triage Call Report Triage Record Num: [id number] Operator: Revonda Humphrey Patient Name: Henry [name] Call Date & Time: 02/25/2011 11:03:12AM Patient Phone: [phone] PCP: Patient Gender: Male PCP Fax : Patient DOB: 10/31/1941 Practice Name: [facility] Reason for Call: Patient calling about sneezing, head congestion started 3/1. Chest and throat congestion with cough started today. Cough is occassional, increases when up walking, decreases if sitting still. Discomfort with crackling sound in chest only with the cough. Up during the night coughing since cough worse lying down. Afebrile. Very slight phlegm whitish. Was exposed to interior paint on Thurs 3/1 before Sx started. Gave care information for cough Nurse Marcelino Duster at office referred patient to [facility], says will go if Sx worse Protocol(s) Used: Cough - Adult Recommended Outcome per Protocol: See Provider within 24 hours Reason for Outcome: Recurrent episodes of uncontrolled coughing interfering with ability to carry out usual activities or with normal sleep patterns Care Advice:  ~ Avoid any activity that produces symptoms until evaluated by provider. Increase fluids to 8-12 eight oz (1.6 to 2.4 liters) glasses per day, half of them to be water. Soups, popsicles, fruit juices, non-caffeinated sodas (unless restricting sodium intake), jello, broths, decaf teas, etc. are all okay. Warm fluids can be soothing.  ~  ~ Call provider immediately to report symptoms, if you have not been previously diagnosed for this problem. 03/ Initial call taken by: Margaret Pyle, CMA,  February 27, 2011 8:24 AM

## 2011-03-15 ENCOUNTER — Other Ambulatory Visit: Payer: Self-pay | Admitting: Endocrinology

## 2011-03-15 DIAGNOSIS — E109 Type 1 diabetes mellitus without complications: Secondary | ICD-10-CM

## 2011-03-20 LAB — MRSA PCR SCREENING: MRSA by PCR: NEGATIVE

## 2011-03-20 LAB — GLUCOSE, CAPILLARY
Glucose-Capillary: 248 mg/dL — ABNORMAL HIGH (ref 70–99)
Glucose-Capillary: 251 mg/dL — ABNORMAL HIGH (ref 70–99)
Glucose-Capillary: 257 mg/dL — ABNORMAL HIGH (ref 70–99)
Glucose-Capillary: 262 mg/dL — ABNORMAL HIGH (ref 70–99)
Glucose-Capillary: 263 mg/dL — ABNORMAL HIGH (ref 70–99)
Glucose-Capillary: 355 mg/dL — ABNORMAL HIGH (ref 70–99)
Glucose-Capillary: 360 mg/dL — ABNORMAL HIGH (ref 70–99)
Glucose-Capillary: 386 mg/dL — ABNORMAL HIGH (ref 70–99)
Glucose-Capillary: 439 mg/dL — ABNORMAL HIGH (ref 70–99)
Glucose-Capillary: 442 mg/dL — ABNORMAL HIGH (ref 70–99)
Glucose-Capillary: 541 mg/dL — ABNORMAL HIGH (ref 70–99)

## 2011-03-20 LAB — BASIC METABOLIC PANEL
BUN: 23 mg/dL (ref 6–23)
BUN: 20 mg/dL (ref 6–23)
BUN: 28 mg/dL — ABNORMAL HIGH (ref 6–23)
BUN: 35 mg/dL — ABNORMAL HIGH (ref 6–23)
BUN: 35 mg/dL — ABNORMAL HIGH (ref 6–23)
CO2: 20 mEq/L (ref 19–32)
CO2: 26 mEq/L (ref 19–32)
Calcium: 7.7 mg/dL — ABNORMAL LOW (ref 8.4–10.5)
Chloride: 105 mEq/L (ref 96–112)
Chloride: 108 mEq/L (ref 96–112)
Chloride: 109 mEq/L (ref 96–112)
Chloride: 98 mEq/L (ref 96–112)
Creatinine, Ser: 1.44 mg/dL (ref 0.4–1.5)
Creatinine, Ser: 1.63 mg/dL — ABNORMAL HIGH (ref 0.4–1.5)
GFR calc Af Amer: >60 mL/min (ref >60)
GFR calc Af Amer: 59 mL/min — ABNORMAL LOW (ref >60)
GFR calc Af Amer: >60 mL/min (ref >60)
GFR calc non Af Amer: >60 mL/min (ref >60)
Glucose, Bld: 277 mg/dL — ABNORMAL HIGH (ref 70–99)
Glucose, Bld: 461 mg/dL — ABNORMAL HIGH (ref 70–99)
Potassium: 3.9 mEq/L (ref 3.5–5.1)
Potassium: 4.2 mEq/L (ref 3.5–5.1)
Sodium: 142 mEq/L (ref 135–145)
Sodium: 133 mEq/L — ABNORMAL LOW (ref 135–145)
Sodium: 136 mEq/L (ref 135–145)

## 2011-03-20 LAB — HEMOGLOBIN A1C: Mean Plasma Glucose: 212 mg/dL

## 2011-03-20 LAB — CARDIAC PANEL(CRET KIN+CKTOT+MB+TROPI)
CK, MB: 2 ng/mL (ref 0.3–4.0)
Relative Index: 1.8 (ref 0.0–2.5)
Total CK: 113 U/L (ref 7–232)
Total CK: 130 U/L (ref 7–232)
Troponin I: 0.01 ng/mL (ref 0.00–0.06)
Troponin I: 0.01 ng/mL (ref 0.00–0.06)
Troponin I: 0.02 ng/mL (ref 0.00–0.06)

## 2011-03-20 LAB — LIPID PANEL
Cholesterol: 137 mg/dL (ref 0–200)
Total CHOL/HDL Ratio: 3.7 RATIO
Triglycerides: 66 mg/dL (ref <150)
VLDL: 13 mg/dL (ref 0–40)

## 2011-03-20 LAB — CBC
HCT: 37.3 % — ABNORMAL LOW (ref 39.0–52.0)
HCT: 28.6 % — ABNORMAL LOW (ref 39.0–52.0)
Hemoglobin: 10.6 g/dL — ABNORMAL LOW (ref 13.0–17.0)
Hemoglobin: 9.7 g/dL — ABNORMAL LOW (ref 13.0–17.0)
MCHC: 33.6 g/dL (ref 30.0–36.0)
MCHC: 33.7 g/dL (ref 30.0–36.0)
MCHC: 34 g/dL (ref 30.0–36.0)
MCV: 91.2 fL (ref 78.0–100.0)
MCV: 92.4 fL (ref 78.0–100.0)
MCV: 92.6 fL (ref 78.0–100.0)
MCV: 92.8 fL (ref 78.0–100.0)
Platelets: 232 10*3/uL (ref 150–400)
Platelets: 159 10*3/uL (ref 150–400)
Platelets: 167 10*3/uL (ref 150–400)
Platelets: 185 10*3/uL (ref 150–400)
Platelets: 194 10*3/uL (ref 150–400)
RBC: 4.09 MIL/uL — ABNORMAL LOW (ref 4.22–5.81)
RBC: 3.42 MIL/uL — ABNORMAL LOW (ref 4.22–5.81)
RDW: 13.7 % (ref 11.5–15.5)
WBC: 13.8 10*3/uL — ABNORMAL HIGH (ref 4.0–10.5)

## 2011-03-20 LAB — COMPREHENSIVE METABOLIC PANEL
ALT: 35 U/L (ref 0–53)
Alkaline Phosphatase: 56 U/L (ref 39–117)
BUN: 23 mg/dL (ref 6–23)
CO2: 23 mEq/L (ref 19–32)
Chloride: 106 mEq/L (ref 96–112)
Potassium: 3.8 mEq/L (ref 3.5–5.1)
Total Protein: 5.9 g/dL — ABNORMAL LOW (ref 6.0–8.3)

## 2011-03-20 LAB — DIFFERENTIAL
Basophils Relative: 1 % (ref 0–1)
Lymphs Abs: 2.8 10*3/uL (ref 0.7–4.0)
Neutro Abs: 4.3 10*3/uL (ref 1.7–7.7)
Neutrophils Relative %: 54 % (ref 43–77)

## 2011-03-20 LAB — PROTIME-INR
INR: 1.02 (ref 0.00–1.49)
INR: 1.16 (ref 0.00–1.49)
Prothrombin Time: 13.3 seconds (ref 11.6–15.2)
Prothrombin Time: 14.7 seconds (ref 11.6–15.2)

## 2011-03-20 LAB — APTT: aPTT: 30 seconds (ref 24–37)

## 2011-03-20 LAB — TSH: TSH: 2.97 u[IU]/mL (ref 0.350–4.500)

## 2011-03-20 LAB — POCT CARDIAC MARKERS
CKMB, poc: 2.5 ng/mL (ref 1.0–8.0)
Myoglobin, poc: 87.2 ng/mL (ref 12–200)

## 2011-05-09 NOTE — H&P (Signed)
Henry [name], SCHEIBEL NO.:  [id number]   MEDICAL RECORD NO.:  [id number]          PATIENT TYPE:  OIB   LOCATION:  5159                         FACILITY:  [facility]   PHYSICIAN:  Carolan Shiver, M.D.    DATE OF BIRTH:  December 10, 1941   DATE OF ADMISSION:  02/12/2008  DATE OF DISCHARGE:                              HISTORY & PHYSICAL   CHIEF COMPLAINT:  Decreased hearing, right ear.   HISTORY OF THE PRESENT ILLNESS:  The patient is a very pleasant 70-year-  old white male with a moderately severe mixed hearing loss, right ear,  due to otosclerosis.  The patient has had a 30+ year history of chronic  hearing loss, which has been progressive.  He underwent an uncomplicated  stapedectomy of his left ear by myself on February 21, 2006 and did  well.  He now has a speech reception threshold of 30 dB, left ear, with  100% discrimination.  The right ear has a moderate-to-moderately severe  mixed hearing loss with an speech reception threshold of 55 dB and 100%  discrimination.  Now that he has done well with the stapedectomy of his  left ear he was recommended for a staged stapedectomy of his right ear.  He is noted to be a Type 1 insulin-dependent diabetic and therefore the  procedure was scheduled at the [facility] main OR.   In preparation for the operation he was thoroughly evaluated by Dr. Gregary Signs  A. Everardo All of [facility].  This included a rest gated stress  myocardial perfusion and wall motion, and left ventricular ejection  fraction study performed on February 07, 2008.  This  study showed no  ischemia and no changes from previous studies.  He was therefore cleared  by cardiology for the procedure.   The risks and complications of the stapedectomy to the right ear were  explained to the patient.  Questions were invited and answered,  and  informed consent was signed and witnessed.   PAST MEDICAL HISTORY:  Serious illnesses include:  1. Coronary artery disease.  2.  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  3. Type 1 diabetes mellitus.  4. Hypertension.   PAST SURGICAL HISTORY:  1. Operations include five-vessel CABG; and,  2. Right carotid endarterectomy.   PROBLEM LIST:  The patient's problem list also includes:  1. Otosclerosis, both ears, with moderate mixed hearing loss.  2. Long-term use of insulin.  3. Coronary atherosclerosis of an unspecified type.  4. HISTORY OF PENICILLIN AND NARCOTIC ALLERGIES, AND ALLERGY TO      RADIOGRAPHIC DYE.  5. Hypertension.  6. Esophageal reflux.  7. Hyperlipidemia.   MEDICATIONS:  The patient's medications include:  1. __________  Plus 1 tablet by mouth daily.  2. Hydrochlorothiazide 25 mg by mouth daily.  3. Prilosec 20 mg by mouth daily.  4. Carvedilol 12.5 mg 1 by mouth twice a day.  5. Hydralazine 25 mg 1 by mouth three times a day.  6. Vytorin 10/80 mg 1 tablet by mouth daily.  7. Tylenol Arthritis 650 mg 1 by mouth twice  a day.  8. The patient is also on Humalog sliding scale 9/310 with 1 unit      added for every 50 units over 100 and Lantus 9 units every morning      and every evening.   ALLERGIES:  THE PATIENT IS ALLERGIC TO CODEINE, X-RAY DYES AND  PENICILLIN.   FAMILY HISTORY:  The family history is positive for diabetes mellitus.   SOCIAL HISTORY:  The patient is married.  He has a college degree and  works as a Holiday representative.  Does not use alcohol or tobacco products.   REVIEW OF SYSTEMS:  Review of systems is positive for cataracts,  hypertension, previous MI, status post CABG, insulin-dependent diabetes  mellitus for over 30 years, and significant peripheral vascular disease.   PHYSICAL EXAMINATION:  VITAL SIGNS:  The vital signs are stable.  GENERAL APPEARANCE:  In general he is awake, alert, coherent,  spontaneous, and logical.  HEENT:  The head, face and eye exams are stable.  He has no nystagmus.  Eyes; PERRLA.  Ears; external ears and canals are stable.   Both TMs are  intact, clear and mobile.  I can see the shadow of his left stapes  prosthesis through the posterosuperior quadrant, left ear.  He has a  negative 2D6 and 512 tuning fork, right ear and lateralized on Weber to  the right ear.  External and internal nasal exams are normal.  Oral  cavity, lips, tongue and palate are normal.  NECK:  The neck is negative with a well-healed right carotid  endarterectomy incision.  CHEST:  The chest is clear.  He has a stable midline sternotomy scar.  HEART:  The heart has a normal sinus rhythm.  ABDOMEN:  The abdomen is benign.  GENITALIA:  The genitalia is not done.  EXTREMITIES:  The extremities are within normal limits.  NEUROLOGIC:  The neurological exam is unremarkable.   LABORATORY DATA:  The laboratory tests show a hemoglobin of 12.0,  hematocrit of 34.4, white blood cell count of 6,400, and platelet count  of 238,000.  BUN 17, calcium 9.4, chloride 107, creatinine 1.1, CO2 29,  glucose 253, sodium 140, and potassium 4.3.  GFR 86 mL per minute for  African-Americans and GFR for non-African-American was 71 mL per minute.  Hemoglobin A-1-C was 9.3%.  Uric acid was 5.0.  Chest x-ray on February 06, 2008 showed no active disease.  EKG showed some sinus bradycardia.  Rest gated stress myocardial perfusion wall motion and left ventricular  ejection fraction study performed at [facility]  on February 07, 2008 showed an ejection fraction of 66%,  ventricular  function was preserved and global left ventricular systolic function was  normal.  Exercise capacity was 10 METS.  The EKG was negative for  ischemia.  There were no EKG changes and no changes in the study  compared to his previous study as read by Dr. Nanetta Batty.   Audiometric testing showed a speech reception threshold of 55 dB, right  ear, with 100% discrimination, with a moderate-to-moderately severe  mixed hearing loss particularly in the low frequencies.   He had a  moderate sensorineural hearing loss, left ear, with a  speech reception  threshold of 30 dB, 100% discrimination, mostly high frequency loss,  left ear, status post previous stapedectomy   IMPRESSION:  1. Moderately severe mixed hearing loss, right ear, secondary to      otosclerosis.  2. Type 1 insulin-dependent diabetes mellitus.  3. Status post five-vessel coronary artery bypass grafting and right      carotid endarterectomy.  4. Long-term use of insulin.  5. History of coronary atherosclerosis.  6. History of ALLERGIES TO PENICILLIN, CODEINE AND RADIOGRAPHIC DYE.  7. Hypertension.  8. Esophageal reflux.  9. Hyperlipidemia.   RECOMMENDATIONS:  The patient was recommended for a stapedectomy of the  right ear with 4.0 mm Robinson titanium prosthesis, large well, narrow  shaft and a vein graft.  The risks and complications of the stapedectomy  are well-known to the patient as he had a previous stapedectomy by  myself on the left ear on February 21, 2006.  The risks and  complications were explained to him.  Questions were invited and  answered; and, informed consent was signed and witnessed.  He was  scheduled for the operation at [facility], room #3, February 12, 2008  at 8:30 A.M.           ______________________________  Carolan Shiver, M.D.     EMK/MEDQ  D:  02/12/2008  T:  02/13/2008  Job:  [id number]   cc:   Gregary Signs A. Everardo All, MD

## 2011-05-09 NOTE — Discharge Summary (Signed)
Henry [name], MOM NO.:  [id number]   MEDICAL RECORD NO.:  [id number]          PATIENT TYPE:  OIB   LOCATION:  5159                         FACILITY:  [facility]   PHYSICIAN:  Carolan Shiver, M.D.    DATE OF BIRTH:  1941-11-27   DATE OF ADMISSION:  02/12/2008  DATE OF DISCHARGE:  02/14/2008                               DISCHARGE SUMMARY   ADMISSION DIAGNOSES:  1. 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  2. Type 1 insulin-dependent diabetes mellitus.  3. History of coronary artery disease.  4. History of right carotid artery disease, status post carotid      endarterectomy.  5. Five-vessel coronary artery bypass graft.  6. History of previous renal compromise.  7. History of hypertension.  8. History of esophageal reflux.  9. History of hyperlipidemia.   DISCHARGE DIAGNOSES:  1. 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  2. Type 1 insulin-dependent diabetes mellitus.  3. History of coronary artery disease.  4. History of right carotid artery disease, status post carotid      endarterectomy.  5. Five-vessel coronary artery bypass graft.  6. History of previous renal compromise.  7. History of hypertension.  8. History of esophageal reflux.  9. History of hyperlipidemia.   OPERATION:  Stapedectomy, right ear with 4.0-mm titanium Roxan Hockey, same  as Roxan Hockey stapedectomy prosthesis with a large well-nourished shaft,  plus a vein graft.   SURGEON:  Carolan Shiver, M.D.   ANESTHESIA:  General endotracheal, Dr. Arta Bruce.   COMPLICATIONS:  None.   DISCHARGE STATUS:  Stable.   SUMMARY OF HOSPITALIZATION:  Henry, [name]. is a 70 year old white  male, type 1 diabetic, on insulin the past 30 years, who was admitted to  [facility] on February 12, 2008, with a diagnosis of  otosclerosis, right ear, with a moderate mixed hearing loss.  Henry [name]  has bilateral otosclerosis and had undergone a on a  successful  stapedectomy, left ear, 1 year ago by myself.  He developed progressive  moderate mixed hearing loss in his right ear and elected to proceed with  a stapedectomy the right ear.  Risks and complications of the procedure  were explained to him.  Questions were invited and answered, and  informed consent was signed and witnessed.  Procedure was scheduled at  [facility] main OR, due to the fact the patient is a type 1 diabetic on  insulin for 30 years and has a known history of coronary artery disease.  In preparation for the operation, he was evaluated by Dr. Romero Belling,  his endocrinologist, and underwent a cardiac stress test which was  unchanged from his previous test.  He was cleared by cardiology for the  procedure.   On the morning of February 12, 2008, he was taken to [facility]  operating room #3 and underwent an uncomplicated stapedectomy, right  ear.  He was found to have a blue footplate and a fixed stapes.  He had  a mobile malleus incus.  A routine stapedectomy was performed.  The  posterior one-third of the plate was removed, and the oval window was  covered with a vein graft of 4.0-mm titanium Robinson prosthesis, with a  large well-nourished shaft was inserted.  His chorda tympani nerve was  dissected free and preserved.  His facial nerve was covered by bone in a  horizontal portion.  There were no other complications.   The patient was recovered in the PACU, required 5 units of NovoLog  insulin.  He was then transferred to room 5159.  During the late evening  of February 12, 2008, he refused his Lantus insulin and NovoLog  coverage.  By the nearly morning hours of February 13, 2008, his blood  glucose level was in the mid-400 range.  On the morning of February 13, 2008, he was changed to a more rigorous q.4 hour sliding scale.  He did  receive this morning Lantus, 9 units.  Throughout the day on February 13, 2008, his blood glucose stabilized.  In the early  morning hours of  February 13, 2008, his glucose was 461 at 5:00 a.m.  He received 5 units  of NovoLog at 5:30 a.m.  At 6:30 a.m., his blood glucose was 444.  He  received 12 units of NovoLog at 6:30 a.m.  At 7:35 a.m., his blood  glucose remained 423.  By the evening of 6:30 p.m., his blood glucose  was 193 on the tighter control of his sliding scale plus Lantus.   The patient complained in the early morning hours of February 13, 2008  of some sudden vertigo.  By the morning, the later morning of  February 13, 2008, the vertigo had improved, and however he still felt unsteady  by the evening of February 13, 2008 and was therefore was maintained in  the hospital.  He reported hearing from the right ear, had a positive  512 tuning fork AD.  He had  intact facial function and no nystagmus.  He was awake, alert and afebrile, otherwise.  He was given Valium 10 mg  p.o. in the evening of February 13, 2008, which helped with his  unsteadiness.   By the second postoperative day, February 14, 2008, he was awake, alert,  afebrile and had stable vital signs, blood sugar was 132.  Seventh nerve  was intact.  He had no nystagmus and continued to have a positive 512  tuning fork on the right side and reported hearing from the right ear.  It was felt that his blood sugar had improved.  He was under better  control.  He was more stable and was recommended for discharge on the  morning of February 14, 2008.  With his wife, he was instructed to  return to my office on February 19, 2008 at 1:50 p.m.  He was placed on  a 2,300-calorie ADA diet, was to avoid heavy lifting greater than 20  pounds x1 week.  He is to avoid water exposure right ear x3 weeks.  He  is to call [phone] for any postoperative problems directly related to  the procedures.  He was given both verbal and written instructions.   DISCHARGE MEDICATIONS:  Include the following:  New medications:  1. Avelox 400 mg p.o. daily x10 days  #10.  2. Darvocet-N 100, two p.o. q.4 h. p.r.n. pain #30.  3. Cipro HC 3 drops right ear t.i.d. x7 days, 10 mL.  4. Phenergan suppositories 12.5 mg, one PR q.6 h. p.r.n. nausea #2.  5. Valium  5 mg p.o. t.i.d. p.r.n. vertigo, unsteadiness, #30.   HOME MEDICATIONS:  He is to continue on his current home medications  which include the following:  1. Folbee Plus one p.o. daily.  2. Hydrochlorothiazide 25 mg p.o. daily.  3. Prilosec 20 mg one p.o. q.a.m.,  #4.  4. Carvedilol 12.5 mg one p.o. b.i.d.  5. Hydralazine 25 mg p.o. t.i.d.  6. Vytorin 10-80, one p.o. q.p.m.  7. Tylenol arthritis 650 mg, 1 to 2 p.o. q.6 h. p.r.n. pain.  8. Humalog sliding scale of 9 units, 9 free and 10 units prior to      breakfast, lunch, dinner meals and Lantus 9 units q.a.m. and q.p.m.   ALLERGIES:  Include penicillin, codeine, and contrast dye.   ADMISSION LABORATORY DATA:  Showed a hemoglobin of 12.8, hematocrit of  37.3, white blood cell count is 7,900, platelet count 248,000.  He had a  PT of 13.2, PTT 27, INR 1.0.  Electrolytes were within normal limits  with a sodium of 142, potassium of 4.0, chloride 108, CO2 27, glucose  81, BUN 22, creatinine 1.07.  Liver function tests were within normal  limits.  Hemoglobin A1c was 9.6.  At that time, his mean plasma glucose  was 264.  Urinalysis was essentially unremarkable, and urine glucose at  that time was negative.  Admission chest x-ray showed previous CABG, no  active disease.  Pre-op EKG showed some sinus bradycardia.  Pre-op  stress test showed ejection fraction of 66 and no ischemic disease.   At the time of hospitalization, the patient was in [facility] operating  room #3 in the PACU and room 5159.           ______________________________  Carolan Shiver, M.D.     EMK/MEDQ  D:  02/14/2008  T:  02/14/2008  Job:  [id number]   cc:   Carolan Shiver, M.D.  Sean A. Everardo All, MD  Endocrinology  Nanetta Batty, M.D.

## 2011-05-09 NOTE — Op Note (Signed)
[name], Henry [name] NO.:  [id number]   MEDICAL RECORD NO.:  [id number]          PATIENT TYPE:  OIB   LOCATION:  2550                         FACILITY:  [facility]   PHYSICIAN:  Carolan Shiver, M.D.    DATE OF BIRTH:  06-Dec-1941   DATE OF PROCEDURE:  02/12/2008  DATE OF DISCHARGE:                               OPERATIVE REPORT   JUSTIFICATION FOR PROCEDURE:  Henry [name] is a 70 year old white male,  type 1 insulin dependent diabetic, here today for a stapedectomy right  ear to treat moderately severe mixed hearing loss of his right ear  secondary to otosclerosis.  Henry [name] had had a long history of  progressive hearing loss and was first seen by me on May 05, 1992, at  age 54.  He had moderate sensorineural hearing losses with SRTs of 30 dB  AU and 100% discrimination AU.  At that time, he did not have air bone  gap. He was eventually fit with binaural BTE hearing aids and he was  followed.  Beginning May 24, 1999, he began opening in air bone gap but  because of significant health problems including insulin dependent  diabetes mellitus and coronary artery disease as well as right carotid  disease requiring a right carotid endarterectomy, the otologic treatment  was postponed.   On February 21, 2006, he underwent an uncomplicated stapedectomy of his  left ear by myself.  He enjoyed a good result and now that he has stable  hearing in the left ear, he was recommended for stapedectomy right ear.  Henry [name] has had insulin dependent diabetes for the past of 32 years.  Therefore, I had him evaluated by Dr. Everardo All preoperatively. He  underwent a complete workup and was found to be very stable from a  cardiovascular standpoint. He underwent a rest gaited stress myocardial  perfusion and wall motion left ventricular ejection fraction study on  February 07, 2008. He was found to have an ejection fraction of 66%,  exercise capacity of 10 mets.  EKG was negative for any  ischemia and he  had no EKG changes. Compared to previous study, he had no significant  changes, no ischemia was demonstrated, it was considered to be a low  risk scan.  This was read by Dr. Nanetta Batty.  He was, therefore,  cleared from a medical standpoint for general anesthetic and a  stapedectomy, right ear.   Preoperative audiometric testing on February 06, 2008, documented an SRT  of 55 dB right ear with 100% discrimination.  He had an SRT of 30 dB  left ear with 100% discrimination.   The risks and complications of stapedectomy were explained to Henry [name].  Questions were invited and answered.  Informed consent was signed and  witnessed.  He was well aware of the risks and complications as he had  undergone a previous stapedectomy of the left ear on February 21, 2006.   JUSTIFICATION FOR INPATIENT SETTING:  The patient's age, need for  general endotracheal anesthesia, and multiple medical problems including  a history of coronary artery disease,  right carotid disease status post  endarterectomy, five vessel CABG, type 1 insulin dependent diabetes  mellitus, and a history of previous renal compromise, history of  hypertension, esophageal reflux and hyperlipidemia.   JUSTIFICATION FOR OVERNIGHT STAY:  1. 23 hours of bedrest status post stapedectomy, right ear.  2. Type 1 insulin dependent diabetes mellitus with a history of      coronary artery disease needing be watched overnight.   PREOPERATIVE DIAGNOSIS:  Moderate mixed hearing loss, right ear,  secondary to otosclerosis.   POSTOPERATIVE DIAGNOSIS:  Moderate mixed hearing loss, right ear,  secondary to otosclerosis.   OPERATION:  Stapedectomy, right ear, with a 4 mm titanium Robinson  stapedectomy prosthesis with a large well narrowed shaft and a vein  graft.   SURGEON:  Carolan Shiver, M.D.   ANESTHESIA:  General endotracheal anesthesia.   ANESTHESIOLOGIST:  Kaylyn Layer. Michelle Piper, M.D.   COMPLICATIONS:  None.    SUMMARY OF REPORT:  After the patient was taken to the operating room,  he was placed in the supine position.  An IV had been begun in the  holding area. General IV induction was then performed under the guidance  of Dr. Arta Bruce and the patient was intubated without difficulty  using a glide scope as he was known to have an anterior larynx.  He was  intubated without difficulty with the glide scope.  He was properly  positioned and monitored.  Elbows and ankles were padded with foam  rubber.  A time out was performed.   A small amount of hair was clipped in the right postauricular area, his  hair was taped, a stockinette cap was applied.  His right ear and hemi-  face were prepped with alcohol as was his distal right forearm.  The  distal right forearm vein was also marked with a marking pen.  His right  postauricular area was infiltrated with 4.5 mL of 1% Xylocaine with  1:100,000 epinephrine.  A small amount of the same local anesthetic was  infiltrated over the distal right forearm vein in preparation for a vein  graft.  Several wire needle electrodes were then inserted into the right  orbicularis oris and oculi muscles and suprasternal notch and connected  to a facial nerve monitor pre amplifier.  The patient's right ear, hemi-  face, and right distal forearm were then prepped with Betadine and  draped in a standard fashion.   A 1 cm incision was made perpendicular to a right distal forearm vein  and a 1 cm segment of vein graft was harvested and placed in saline.  The vein was triply ligated with 3-0 silk ties.  The skin was closed  with running locking 5-0 Ethilon.  Bacitracin and a Band-Aid was  applied.  The patient's arm was padded and tucked by his right side.   Gowns and gloves were changed.  Further draping of the patient's right  ear was then performed.  Examination of the right ear canal revealed a  5.5 mm diameter canal.  The canal was cleaned and debrided and  irrigated  with saline.  Four quadrant ear canal blocks were performed with a 1.4  mL 2% Xylocaine with 1:50,000 epinephrine.  The vein graft was then  opened, pressed in a vein press, dried, and set aside for later use.  An  atticotomy flap was then elevated with Ivan Croft and [employer].  The  middle ear was entered without difficulty using a curved Turner needle.  The  chorda tympani nerve was dissected free and preserved.  Examination  of the middle ear revealed a blue footplate mobile malleus incus and a  fixed stapes.  The middle ear mucosa was healthy.  The facial nerve was  covered by bone in the horizontal portion and the anatomy appeared to be  normal with exception of the otosclerosis.   The posterior superior bony canal wall was then curetted with a stapes  curette to identify the facial canal.  The facial nerve was covered by  bone in a horizontal portion.  Enough bone was removed to see the  pyramidal eminence.  Small adhesions were lysed in the oval window.  A  measurement was taken from the footplate to the medial surface of the  incus, this was measured 4 mm, a standard measurement.  The mucosa  covering the facial canal as well as surrounding the oval window was  then etched with a straight Koss pick.  The incudostapedial joint was  then separated with tab and joint knives and the stapedial tendon was  cut with McGee scissors.   A 4 mm titanium Robinson stapedectomy prosthesis with a large long  narrow shaft was then opened and measured to confirm its 4 mm length.  This was then set aside.  The vein graft was dried and trimmed and set  aside.   A small control hole was then made in the center of the footplate with a  straight Koss pick.  This was done very gently and atraumatically.  The  stapes superstructure was then down fractured and removed and sent as a  separate specimen to pathology.   The posterior 1/3 of the footplate was then removed with a 1/3 mm 45   degrees Kos foot plate hook and inferior rasp.  The vein graft, which  had been previously dried and trimmed, was then placed in the middle ear  from the promontory side and then draped into the oval window and  superiorly over the facial canal.  A measuring rod was used to gently  position the vein graft in the oval window and I could see the outline  of the oval window through the graft.   The 4 mm titanium Robinson prosthesis with a large long narrow shaft was  then placed in the middle ear with the shaft resting in the oval window  on the vein graft and the well of the prosthesis adjacent to the  lenticular process.  Using a two handed technique, the incus was  retracted laterally with a 90 degrees incus hook and the well of the  prosthesis was guided on the lenticular process in one smooth motion  using a strut guide.  The wire keeper was then rotated for 100 degrees  onto the lenticular process of the incus.  Gentle palpation of the  malleus yielded normal mobility of the malleus, incus, and prosthesis  unit and there was a positive round window reflex.  Three 1/8 disks of  Gelfoam soaked in saline were then packed around the vein graft to hold  it in place.  All cotton balls were removed from the ear canal and the  cotton ball count was deemed to be correct.  The atticotomy flap was  then draped up the posterior bony canal wall in the anatomic position  and the medial ear canal was packed with Gelfoam disks soaked in Cipro  HC.  A Bacitracin cotton ball was placed.   The patient was then awakened, extubated, and  transferred to his  hospital bed.  He tolerate the general endotracheal anesthesia and the  procedures well and left the operating room in stable condition.   Total fluids 950 mL.  Total blood loss less than 5-10 mL.  Sponge,  needle and cotton ball counts were correct at the termination of the  procedure.  Right stapes superstructure was sent as a specimen to   pathology.  The patient received clindamycin 600 mg IV and Zofran 4 mg  IV at the beginning of the procedure and Decadron 10 mg IV.   Henry [name] will be admitted to a private room for overnight observation.  If stable overnight, he will be discharged on February 13, 2008, with  his wife.  He will be instructed to return to my office on February 19, 2008, at 1:50 p.m.   Discharge medications will include Avelox 400 mg, #10 tabs, 1 p.o. daily  x10 days, Cipro HC 3 drops AD t.i.d. x1 week, Darvocet N100, # 30  tablets, 1-2 p.o. q.4h. p.r.n. pain, and Phenergan suppositories 12.5  mg, #2, one PR q.6h. p.r.n. nausea.  He is to keep his head elevated.  Avoid aspirin or aspirin products. Avoid nose blowing or sneezing for  two weeks and avoid water exposure AD for two weeks.  He is to call [phone] for any postoperative problems directly related to the procedure.  He will be given both verbal and written instructions.   In summary , the patient was found to have classic otosclerosis, right  ear.  He had a fixed stapes with a blue footplate and a mobile malleus  and incus.  The posterior 1/3 of the footplate was removed.  A 4 mm  titanium Robinson stapedectomy prosthesis with a large long narrow shaft  was placed over a vein graft harvested from the  right distal forearm.  The chorda tympani nerve was dissected free and  preserved.  The facial nerve was covered by bone in the horizontal  portion.  There was a positive round window reflex.  There were no  perforations.  Cotton ball count was correct at the termination of the  procedure.  This was a routine stapedectomy and very atraumatic.           ______________________________  Carolan Shiver, M.D.     EMK/MEDQ  D:  02/12/2008  T:  02/12/2008  Job:  [id number]   cc:   Gregary Signs A. Everardo All, MD

## 2011-05-12 ENCOUNTER — Ambulatory Visit (INDEPENDENT_AMBULATORY_CARE_PROVIDER_SITE_OTHER): Payer: Medicare Other | Admitting: Endocrinology

## 2011-05-12 ENCOUNTER — Other Ambulatory Visit (INDEPENDENT_AMBULATORY_CARE_PROVIDER_SITE_OTHER): Payer: Medicare Other

## 2011-05-12 ENCOUNTER — Encounter: Payer: Self-pay | Admitting: Endocrinology

## 2011-05-12 DIAGNOSIS — E785 Hyperlipidemia, unspecified: Secondary | ICD-10-CM

## 2011-05-12 DIAGNOSIS — N259 Disorder resulting from impaired renal tubular function, unspecified: Secondary | ICD-10-CM

## 2011-05-12 DIAGNOSIS — I2510 Atherosclerotic heart disease of native coronary artery without angina pectoris: Secondary | ICD-10-CM

## 2011-05-12 DIAGNOSIS — D649 Anemia, unspecified: Secondary | ICD-10-CM

## 2011-05-12 DIAGNOSIS — E109 Type 1 diabetes mellitus without complications: Secondary | ICD-10-CM

## 2011-05-12 DIAGNOSIS — Z515 Encounter for palliative care: Secondary | ICD-10-CM | POA: Insufficient documentation

## 2011-05-12 DIAGNOSIS — Z79899 Other long term (current) drug therapy: Secondary | ICD-10-CM

## 2011-05-12 DIAGNOSIS — Z0000 Encounter for general adult medical examination without abnormal findings: Secondary | ICD-10-CM

## 2011-05-12 DIAGNOSIS — E7800 Pure hypercholesterolemia, unspecified: Secondary | ICD-10-CM | POA: Insufficient documentation

## 2011-05-12 DIAGNOSIS — Z125 Encounter for screening for malignant neoplasm of prostate: Secondary | ICD-10-CM

## 2011-05-12 DIAGNOSIS — I10 Essential (primary) hypertension: Secondary | ICD-10-CM

## 2011-05-12 LAB — URINALYSIS, ROUTINE W REFLEX MICROSCOPIC
Bilirubin Urine: NEGATIVE
Ketones, ur: NEGATIVE
Leukocytes, UA: NEGATIVE
Nitrite: NEGATIVE
Specific Gravity, Urine: 1.01 (ref 1.000–1.030)
Urobilinogen, UA: 0.2 (ref 0.0–1.0)
pH: 6 (ref 5.0–8.0)

## 2011-05-12 LAB — LIPID PANEL
Cholesterol: 173 mg/dL (ref 0–200)
LDL Cholesterol: 112 mg/dL — ABNORMAL HIGH (ref 0–99)
Total CHOL/HDL Ratio: 4
Triglycerides: 98 mg/dL (ref 0.0–149.0)

## 2011-05-12 LAB — TSH: TSH: 2.71 u[IU]/mL (ref 0.35–5.50)

## 2011-05-12 LAB — HEMOGLOBIN A1C: Hgb A1c MFr Bld: 10.3 % — ABNORMAL HIGH (ref 4.6–6.5)

## 2011-05-12 NOTE — Op Note (Signed)
NAMECLINE, DRAHEIM NO.:  [id number]   MEDICAL RECORD NO.:  [id number]          PATIENT TYPE:  INP   LOCATION:  2899                         FACILITY:  [facility]   PHYSICIAN:  Carolan Shiver, M.D.    DATE OF BIRTH:  Feb 03, 1941   DATE OF PROCEDURE:  02/21/2006  DATE OF DISCHARGE:                                 OPERATIVE REPORT   JUSTIFICATION FOR PROCEDURE:  Henry [name] is a 70 year old white male  here today for a stapedectomy asked to treat moderately severe mixed hearing  loss, A.S.  Henry [name] has had a long history of progressive hearing loss.  He was seen by me on May 05, 1992, at age 70.  He was noted to be an insulin-  dependent diabetic for 31 years, and his hearing had decreased over a 15 to  18 month period.  At that time, he had moderate sensory neural hearing  losses with an SRT of 30 dBAU and 100% discrimination.  He did not have an  air bone gap at that time.  He was eventually fit with binaural BTE hearing  aids.  I have followed him regularly since that time.  Since May 30th of  2000, he began opening up an air bone gap; however, he had some significant  health problems including the insulin-dependent diabetes mellitus, coronary  artery disease eventually leading to a CABG, and right carotid disease  leading to a right carotid endarterectomy.  Now that his other health  problems seem to have been stabled, he was cleared by Dr. Tresa Endo of  Cardiology for general anesthetic, by Dr. Everardo All of Endocrinology, and he  was recommended for an exploratory tympanotomy, A.S., for probable  stapedectomy, A.S.  He also has an air bone gap in the right ear; however,  his left ear was the poor hearing ear.  The risks and complications of  stapedectomy were explained to him, questions were invited and answered, and  informed consent was signed and witnessed.  Again, he was thoroughly  evaluated from a Cardiology standpoint and an Endocrinology standpoint prior  to undergoing the general anesthetic.   Preoperative audiometric testing on February 19, 2006, documented SRTs of 50  dBAD, 55 dBAS, with 96% discrimination A.D. and 100% discrimination A.S.   JUSTIFICATION FOR INPATIENT SETTING:  Patient's age, need for general  endotracheal anesthesia, and a very brittle insulin-dependent diabetes  mellitus on Humalog sliding scale insulin, 8-5-9 sliding scale, plus 1 unit  for every 50 reading over 100, and Lantus insulin 13 units q.a.m. and q.p.m.  It was felt that he needed to be done in an inpatient setting with overnight  stay and be followed by his endocrinologist because of his significant  diabetes mellitus.   JUSTIFICATION FOR OVERNIGHT STAY:  As above.   PREOPERATIVE DIAGNOSIS:  Moderately severe mixed hearing loss, A.S., rule  out otosclerosis, A.S.   POSTOPERATIVE DIAGNOSES:  Moderately severe mixed hearing loss, A.S., with  otosclerosis, A.S.   OPERATION:  Stapedectomy, A.S., with 4.0 mm titanium Robinson prosthesis  with a large well and narrow shaft  plus a vein graft.   SURGEON:  Carolan Shiver, MD.   ANESTHESIA:  General endotracheal, Dr. Bedelia Person.   COMPLICATIONS:  None.   SUMMARY OF REPORT:  After the patient was taken to the operating room, he  was placed in the supine position.  An IV had been begun in the holding  area, a time-out was performed, a general IV induction was then performed  under the guidance of Dr. Gypsy Balsam.  The patient was difficult to intubate and  required intubation using a glide scope.  This was done on the first attempt  with the glide scope.  He was then properly positioned and monitored, elbows  and ankles were padded with foam rubber.  Hair was clipped in the left  postauricular area, his hair was taped, and a stocking cap was applied.  His  left external ear and hemi-face and left distal forearm were prepped with  70% Isopropyl alcohol and then with Betadine.  His left postauricular area  was  infiltrated with 5 ml of 1% Xylocaine with 1:100,000 epinephrine, and a  small amount of the same local anesthesia was injected over the distal left  forearm vein.  Silver wire needle electrodes were then inserted into the  left orbicularis oris and oculi muscles, suprasternal notch, and left  shoulder and connected to a Nim facial nerve monitor pre-amplifier.   The patient's left distal forearm was then prepped with Betadine and draped  in a standard fashion.   VEIN GRAFT HARVESTING:  A 1-cm incision was made over the distal left  forearm vein and a 1-cm segment of vein graft was harvested and placed in  saline.  The vein was ligated with 4-0 silk stick ties.  The skin was closed  with a running locking 5-0 Ethilon, a Bacitracin band-aid was applied.  His  arm was then padded and tucked by his left side.   STAPEDECTOMY PROCEDURE:  Gowns and gloves were changed.  The patient's left  ear and hemi-face were then prepped with Betadine and draped in a standard  fashion for a stapedectomy.   Examination of the left ear canal revealed a 5.5 mm diameter canal, which  was dilated to a #6, the ear canal was cleaned of cerumen and debris.  Four-  quadrant ear canal blocks were performed with 0.8 ml of 1% Xylocaine with  1:100,000 epinephrine.  The vein graft, which had been previously harvested,  was opened, pressed in a vein press, dried, trimmed, and set aside for later  use.  An atticotomy flap was then elevated with McCabe and [employer], and  the middle ear was entered without difficulty, the chorda tympany nerve was  dissected free and preserved, the posterior-superior bony canal wall was  curetted with a stapes J curette.  Palpation of the ossicles revealed a  mobile malleus and incus and a very thick stapes, there was a blue  footplate.  The facial nerve was covered by bone in the horizontal portion.  A measurement was taken from the footplate to the medial surface of the incus and  this measured 4 mm.  The mucosa surrounding the oval window was  then etched with a straight Koss pick as was the mucosa covering the facial  canal.  The incudis pedial joint was then separated with a joint knife and  the stapedial tendon was cut.  A 4.0 mm titanium Robinson prosthesis with a  large well and narrow shaft was then opened to confirm the 4.0-mm  length.   A control hole was then made in the center of the footplate with a straight  Koss pick and a stapes super structure was down fractured, the anterior crus  was removed, the capitulum and posterior crus were then removed separately.  The control hole was enlarged with a one-third millimeter, 45 degree Kos  footplate hook.  The posterior one-half of the footplate was then removed  with a Ferrier rasp.  The anterior one-half of the footplate was gently  elevated, but I could not remove it totally.  It was pushed anteriorly out  of the way.  The vein graft, which had been previously harvested, was then  placed in the middle ear from the promontory side, used to drape over the  promontory down into the oval window and superiorly over the facial canal.  A small dimple was made in the vein graft in the oval window area to accept  the prosthesis.  A 4.0 mm titanium Robinson prosthesis with a large well and  narrow shaft was then placed in the middle ear with the well against the  lenticular process.  Using a two-handed technique, the incus was gently  retracted laterally with an incus hook, and the well of the prosthesis was  guided onto the lenticular process with a strut guide.  The wire keeper was  rotated for 110 degrees onto the long process of the incus.  Gentle  palpation of the malleus revealed the malleus, incus, and prosthesis to be  mobile, the was a positive round window reflex.  Eight disks of Gelfoam  soaked in saline were then packed around the oval window to pack the vein  graft into place.  The ear was suctioned, all  cotton balls were removed from  the ear canal, there were three cotton balls with 1:1000 adrenaline on them  used as the atticotomy flap was elevated, these cotton balls were removed,  the cotton ball count was deemed to be correct.  The atticotomy flap was  then returned to anatomic position by draping up the posterior bony canal  wall.  The medial ear canal was then packed with Gelfoam disks soaked in  Ciprodex drops and a Bacitracin cotton ball was placed in the external  auditory meatus.  The patient was given 50 mg of IV Lidocaine, he was then  awakened and extubated deeply so as not to cough, he was transferred to his  hospital bed in stable condition.  He tolerated both the general  endotracheal anesthesia and the procedures well and left the operating room  in stable condition.   TOTAL FLUIDS:  350 ml.   ESTIMATED BLOOD LOSS:  Less than 10 ml.   COUNTS:  Sponge, needle, and cotton ball counts were correct at the  termination of the  procedure.  MEDICATIONS GIVEN:  The patient received Clindamycin 600 mg IV as he is  ALLERGIC TO PENICILLIN, 4 mg of Zofran at the beginning, and 4 mg of Zofran  at the termination of the procedure.  The patient's blood glucose was  monitored closely during the procedure.  He fluctuated from a high of 350 to  a low of 58 and was placed on an intraoperative glucose pump.   DISPOSITION:  Henry [name] will be admitted to the inpatient hospital floor  5700 for overnight observation.  Dr. Everardo All of Endocrinology will follow  him from a diabetic standpoint, and he will be placed on a sliding scale of  insulin as well as his  regular home insulin.  If stable overnight, he will  be discharged on February 22, 2006, and he will be instructed to return to my  office on February 27, 2006, at 1:20 p.m. for followup.   DISCHARGE MEDICATIONS:  1.  Avelox 400 mg p.o. daily x10 days.  2.  Cipro-HC 3 drops A.S. t.i.d. x1 week.  3.  Darvocet N-100, #30, 1-2 p.o. q.4h p.r.n.  pain.  4.  Phenergan suppositories 25 mg, #2, one PR q.6h p.r.n. nausea.   He will continue on all of his home medications, which include:  1.  Humalog insulin sliding scale of 8-5-9 with 1 unit for every 50 reading      over 100.  2.  Lantus insulin 13 units q.a.m. and q.p.m.  3.  Folbee 1 p.o. daily.  4.  Torsemide 20 mg p.o. daily.  5.  Nexium 20 mg p.o. daily.  6.  Toprol-XL 10 mg p.o. daily.  7.  Cardia 120 mg p.o. q.h.s.  8.  Vytorin 10/80 1 p.o. q.h.s.   DISCHARGE INSTRUCTIONS:  Henry [name] will be instructed to keep his head  elevated, avoid aspirin or aspirin products, avoid water exposure A.S., and  call [phone] for any postoperative problems related to the procedure.  He  will be given both verbal and written instructions.           ______________________________  Carolan Shiver, M.D.     EMK/MEDQ  D:  02/21/2006  T:  02/21/2006  Job:  [id number]   cc:   Nicki Guadalajara, M.D.  Fax: [phone]   Sean A. Everardo All, M.D. [facility]  [address]  [place]  [place] [address]

## 2011-05-12 NOTE — Discharge Summary (Signed)
NAME:  Henry [name], Henry [name]                           ACCOUNT NO.:  [id number]   MEDICAL RECORD NO.:  [id number]                   PATIENT TYPE:  INP   LOCATION:  5531                                 FACILITY:  [facility]   PHYSICIAN:  Tera Mater. Evlyn Kanner, M.D.              DATE OF BIRTH:  19-Jul-1941   DATE OF ADMISSION:  01/07/2003  DATE OF DISCHARGE:  01/15/2003                                 DISCHARGE SUMMARY   DISCHARGE DIAGNOSES:  1. Extreme hyperglycemia with hyperosmolar state, resolved.  2. Known type 1 diabetes, improved.  3. Fever of unknown origin, source uncertain.  4. 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  5. History of retinopathy, stable.  6. Neuropathy, stable.  7. Coronary disease, stable without evidence of ischemia or congestive heart     failure.  8. Carotid vascular disease, stable.   CONSULTATIONS:  Renal, Dr. Garnetta Buddy, as well as infectious disease Dr.  Rockey Situ. Roxan Hockey.   PROCEDURE:  1. Ventilation perfusion lung scan.  2. Renal ultrasounds.  3. CT scan prior to admission.   HISTORY:  The patient is a 70 year old, white male with longstanding type 1  diabetes since 1962, as well as coronary disease, vascular disease, and  hypertension.  He was seen on 01/05/2003, with a history of chills, shakes,  and malaise and presented this time with a creatinine that risen up to 3.7  spiking fevers to over 102 and extreme hyperglycemia blood sugar in excess  of 600.  He did not have any evidence of acidosis or ketosis.  He was  treated initially with IV Rocephin, which was continued from 01/07/2003  through 01/12/2003.  Blood cultures were done and did not end up growing  anything.  His creatinine crawled into a high of 4.9 and with gentle  hydration and resolution of his hyperglycemia has come back down slowly day  by day to a low of 2.3.  There was one spurious reading of 1.7,  which does  not appear to be real.  He has had two different ultrasounds to rule out  hydronephrosis and none was seen.  There had been some perinephric stranding  on the CT of the abdomen suggesting possible recent infection.  His coronary  disease has been stable here.  We did have infectious disease see him and  really have found no clear and obvious reason for his fevers that were in  excess of 102.  Indeed he had complete evaluation and examination done on  several occasions and there is no evidence of lymphoma, no evidence of  myeloma, no evidence of occult abscess, no evidence of UTI, no evidence of  pneumonia, no evidence of a gastrointestinal infection, no evidence of an  overt viral infection, and this leaves [place] with a diagnosis of FUO with  possible, but unlikely  relationship to his Diovan, which was recently given.  Clearly his renal insufficiency appears to have been worsened by the  combination of the Diovan and Altace and both of those have been stopped and  will not be restarted.  His course has been difficult to manage in that his  hyperglycemia got better, but all the other parts of his care have been more  difficult.  The problem diagnoses as listed above are based on our best  clinical guess, but again evidence specifically of why all of this happened  is still lacking.  The patient clinically is doing much better.  He had a  minor hyperglycemic episode this morning.  He has recovered from this.  His  ventilation perfusion lung scan ruled out, he had a low probability for  pulmonary emboli.  There is no evidence of Cortisol dependency or other  unusual cause of fever.   LABORATORY DATA:  Blood cultures times four were all negative.  Today's  chemistries:  Sodium 136, potassium 3.9, chloride 110, CO2 17, BUN 43,  creatinine 2.3, white count 8700, hemoglobin 8.5, reticulocyte count 36,000  absolute.  Initial white count was 10,900, peak was 12,7000.  Hemoglobin  started  at 11, but after hydration has been 9.2 to 9.4 with a high of 9.7.  His sed rate was elevated at 100.  His initial sodium 130, potassium 4.8,  chloride 99, BUN 64, creatinine 4.9, glucose here 381, before that was 623.  Sugar did rise as high as 416 on the second hospital day and has now been in  the 100s most of the time.  His BUN and creatinine as noted peaked at 4.9  and 64 and came down to the 2.3 range.  An SPEP was only nonspecific.  Troponin was 0.02, TSH 2.268.  The morning Cortisol was 15.6, C3 nine points  low at 79 mg/dl, C4 was normal at 21 mg/dl.  A 24 hour urine revealed  protein of 420 and no calculated creatinine clearance unfortunately.  Urinalysis basically shows 3 to 6 WBCs and 1 time of trace hemoglobin,  otherwise completely normal.  Rheumatoid factor was less than 20.  ANA was  negative.  Chest x-ray:  Small pleural effusions, streaky basilar  atelectasis.  No infiltrates or edema, that was dated 01/14/2003.  Ultrasound of the kidney was unremarkable on 01/14/2003, as well as on  01/07/2003.  On 01/08/2003, as well his right kidney was 12.4 cm and left  was 12.5 cm.  Cortex seems to be well preserved without significant  echogenicity.   SUMMARY:  We have a 70 year old, white male with longstanding diabetes with  vascular disease presenting with extreme hyperglycemia and fever, as well as  markedly worsened renal insufficiency.  He has improved in most areas and  most of this has still been difficult to sort out.  His fever of unknown  origin has improved, but is not totally gone, but he has been off of  antibiotics without any problems.  He has not had significant fevers above  101 and he has no signs of clinical toxicity.  His blood sugars have come  under control and we are going to watch him as an outpatient and perhaps  further testing as indicated at that point.   He will have medications discharged to include Lantus 12 units at breakfast, 28 units at bedtime,  sliding scale Humulin, as well as Zocor, Nexium 20,  Toprol is now 100 mg, Norvasc is 5 mg and he  will stay off the Diovan,  Demadex, and Altace.  Nexium can be resumed at 20 mg daily.  We will see him  in one week.  Diet is as before.  No wound care is necessary.                                               Tera Mater. Evlyn Kanner, M.D.    SAS/MEDQ  D:  01/15/2003  T:  01/16/2003  Job:  [id number]

## 2011-05-12 NOTE — H&P (Signed)
NAMEKEENEN, ROESSNER NO.:  [id number]   MEDICAL RECORD NO.:  [id number]          PATIENT TYPE:  INP   LOCATION:  2899                         FACILITY:  [facility]   PHYSICIAN:  Carolan Shiver, M.D.    DATE OF BIRTH:  Jul 31, 1941   DATE OF ADMISSION:  02/21/2006  DATE OF DISCHARGE:                                HISTORY & PHYSICAL   CHIEF COMPLAINT:  Decreased hearing both ears.   HISTORY OF PRESENT ILLNESS:  Henry [name] is a 70 year old white male here  today with moderately severe mixed hearing losses A.U. thought to be due to  otosclerosis.  Mr. Eugene was first evaluated by me when he was age 23, on  April 05, 1992.  At that time he had a 15 to 70-month history of gradual  hearing loss.  Initially, his hearing loss was sensorineural and by 2000, he  was found to have air bone gaps and was felt to have probable bilateral  otosclerosis.  However, he had some significant health problems and  eventually underwent a CABG procedure for coronary artery disease and a  right carotid endarterectomy.  He is a known insulin-dependent diabetic for  many years.  When I first saw him, he had been a diabetic for at least 31  years.  He has been wearing binaural BTE hearing aids and has not been  hearing well with them.  Now that his other conditions seem to be relatively  stable, he was recommended for staged stapedectomies to begin with his left  ear and if successful, wait a year and then perform a stapedectomy on his  right ear.  Risks and complications of stapedectomy were explained to him.  Questions were invited and answered and informed consent was signed and  witnessed.   In preparation for the procedure, he was thoroughly evaluated by his  cardiologist, Dr. Daphene Jaeger.  He underwent a stress test which he passed,  and he was seen by Dr. Romero Belling of endocrinology in preparation for the  procedure.   PAST MEDICAL HISTORY:  Serious illnesses include:  1.  Coronary  artery disease.  2.  Right carotid artery disease.  Operations include:  1.  Five-vessel CABG.  2.  Right carotid endarterectomy.   MEDICATIONS:  1.  Humalog insulin sliding scale 859 plus one unit for every 50 reading      over 100.  2.  Lantus insulin 13 units q.a.m. and 13 units q.p.m.  3.  Cartia 120 mg p.o. q.p.m.  4.  Vytorin 10/80 mg, one p.o. q.p.m.  5.  Toprol XL 10 mg p.o. q.a.m.  6.  Nexium 20 mg p.o. q.a.m.  7.  Torsemide 20 mg p.o. q.a.m.  8.  ____________ one p.o. q.a.m.   MEDICATION ALLERGIES:  1.  PENICILLIN.  2.  CODEINE.  3.  IVP DYE.   FAMILY HISTORY:  Positive for diabetes mellitus.   SOCIAL HISTORY:  He is married, has a Geographical information systems officer, works as a Holiday representative,  does not use alcohol or tobacco products.   REVIEW OF SYSTEMS:  Positive for cataracts, hypertension, and MI, and the  insulin-dependent diabetes mellitus, as well as significant peripheral  vascular disease.   PHYSICAL EXAMINATION:  VITAL SIGNS:  Stable.  HEAD/FACE/EYES:  Stable.  He had no nystagmus.  Pupils PERRLA.  GENERAL:  He was awake, alert, coherent, spontaneous, and logical.  EARS:  External ears and canals were stable.  Both TMs were intact and  mobile.  He had negative 512 tuning forks bilaterally.  NOSE:  Negative.  ORAL CAVITY:  Lips, tongue, palate normal.  NECK:  Negative with a well healed right carotid endarterectomy incision.  CHEST:  Clear, had a stable midline sternotomy scar.  HEART:  Normal sinus rhythm.  ABDOMEN:  Benign.  GENITALIA:  Not done.  EXTREMITIES:  Within normal limits.  NEUROLOGIC:  Within normal limits.   LABORATORY TESTS:  Hemoglobin hematocrit were stable.  They are not on the  chart but were reviewed.  Coagulation profile was within normal limits.  Urinalysis was unremarkable.  Chest x-ray showed no active cardiopulmonary  disease.  There was evidence of the previous coronary artery bypass  grafting.  EKG showed sinus bradycardia, otherwise within  normal limits.   Exercise technetium-99 Cardiolite myocardial perfusion study, performed on  January 17, 2006, showed lung heart ratio was measured at 0.35.  The images  were reconstructed and the short axis vertical and horizontal long axes.  There was scintigraphic evidence of inferior scar versus diaphragmatic  attenuation.  Dynamic gating revealed normal wall motion and endocardial  thickening in all vascular territories with an EF calculated by QGS of 69%.  Overall impression was negative adequate Bruce protocol exercise stress test  with scintigraphic evidence of diaphragmatic attenuation with normal dynamic  gated images with an EF of 69%, calculated by QGS.  This represented a low  risk study.   Audiometric testing on February 19, 2006, documented bilateral moderately  severe mixed hearing losses with SRTs of 50 dB A.D. 55 dB A.S., 96% discrim  A.D. and 100% discrim A.S.  There was a masking dilemma.   IMPRESSION:  1.  Moderately severe mixed hearing losses, most likely due to otosclerosis.  2.  Insulin-dependent-diabetes mellitus.  3.  Status post five-vessel coronary artery bypass graft and right carotid      endarterectomy.   RECOMMENDATIONS:  The patient was recommended for a stapedectomy A.S. with a  4.0-mm Robinson titanium prosthesis and a vein graft technique.  Risks and  complications of stapedectomy were explained to him.  Questions were invited  and answered and informed consent was signed and witnessed.  He is scheduled  for the main OR room #3, February 21, 2006, at 8:30 a.m.           ______________________________  Carolan Shiver, M.D.     EMK/MEDQ  D:  02/21/2006  T:  02/21/2006  Job:  [id number]   cc:   Gregary Signs A. Everardo All, M.D. [facility]  [address]  [place]  [place] [address]   Nicki Guadalajara, M.D.  Fax: [phone]

## 2011-05-12 NOTE — H&P (Signed)
[place]. [facility]  Patient:    Henry [name], Henry [name] Visit Number: [id number] MRN: [id number]          Service Type: DSU Location: [facility] 2899 15 Attending Physician:  Ivor Messier Dictated by:   Guadelupe Sabin, M.D. Admit Date:  12/06/2001   CC:         Jeannett Senior A. [place], M.D. with labs, EKGs, chest x-ray   History and Physical  CHIEF COMPLAINT: This was a planned outpatient surgical admission of this 70 year old white male, admitted for cataract implant surgery of the right eye.  HISTORY OF PRESENT ILLNESS: This patient has a long history of insulin-dependent diabetes mellitus and secondary complications of diabetic retinopathy and progressive cataract formation in both eyes.  He has undergone previous laser photocoagulation surgery to both eyes, and was previously admitted on January 30, 2000 for cataract implant surgery of the left eye. The patient did well following this surgery and has noted deterioration of vision now in the right eye.  He signed an informed consent and arrangements were made for his outpatient admission at this time.  PAST MEDICAL HISTORY: The patient continues under the care of Dr. Evlyn Kanner, and is said to be in stable condition for the proposed surgery.  MEDICATIONS: The patient takes multiple medications under Dr. Lyndle Herrlich direction, including:  1. Insulin.  2. Vitamins.  3. Vioxx.  4. Prevacid.  5. Diovan.  6. Zocor.  7. Toprol.  8. Altace.  9. Hydrochlorothiazide. 10. Aspirin one q.d.  REVIEW OF SYSTEMS: No current cardiorespiratory complaints.  PHYSICAL EXAMINATION:  VITAL SIGNS: As recorded on admission, blood pressure 160/75, temperature 98.7 degrees, heart rate 71, respirations 16.  GENERAL APPEARANCE: The patient is an alert, well-nourished, well-developed white male in no acute distress.  HEENT: Eyes - visual acuity with best correction 20/100 right eye, 20/30 left eye.  External ocular and  slit-lamp examination - nuclear and posterior subcapsular cataract right eye, posterior chamber intraocular lens implant left eye.  Detailed fundus examination reveals clear vitreous, attached retina, with old laser photocoagulation for background diabetic retinopathy. Retinopathy at the present time appears to be stable.  No hemorrhage, exudation, or neovascularization seen.  CHEST: Lungs clear to percussion and auscultation.  HEART: Normal sinus rhythm.  No cardiomegaly.  No murmurs.  ABDOMEN: Negative.  EXTREMITIES: Negative.  ADMISSION DIAGNOSIS:  1. Nuclear and posterior subcapsular cataract, right eye.  2. Background diabetic retinopathy, right eye.  3. Status post laser photocoagulation, right eye.  4. Pseudophakia, left eye.  SURGICAL PLAN: Cataract implant surgery, right eye. Dictated by:   Guadelupe Sabin, M.D. Attending Physician:  Ivor Messier DD:  12/06/01 TD:  12/06/01 Job: [id number]

## 2011-05-12 NOTE — Patient Instructions (Addendum)
please consider these measures for your health:  minimize alcohol.  do not use tobacco products.  have a colonoscopy at least every 10 years from age 70.  keep firearms safely stored.  always use seat belts.  have working smoke alarms in your home.  see an eye doctor and dentist regularly.  never drive under the influence of alcohol or drugs (including prescription drugs).  those with fair skin should take precautions against the sun. please let me know what your wishes would be, if artificial life support measures should become necessary.  it is critically important to prevent falling down (keep floor areas well-lit, dry, and free of loose objects) good diet and exercise habits significanly improve the control of your diabetes.  please let me know if you wish to be referred to a dietician.  high blood sugar is very risky to your health.  you should see an eye doctor every year. controlling your blood pressure and cholesterol drastically reduces the damage diabetes does to your body.  this also applies to quitting smoking.  please discuss these with your doctor.  you should take an aspirin every day, unless you have been advised by a doctor not to. blood tests are being ordered for you today.  please call [phone] to hear your test results.  You will be prompted to enter the 9-digit "MRN" number that appears at the top left of this page, followed by #.  Then you will hear the message. You should have a vaccine against shingles (a painful rash which results from the  chickenpox infection which most people had many years ago).  This vaccine reduces, but does not totally eliminate the risk of shingles.  Because this is a medicare part d benefit, there are 3 ways you can get it:  You can go to a pharmacy and get the injection (I can give you a prescription), or I can give you a prescription to have filled at a pharmacy, and bring back here for [place] to give.  The other option is that you can pay up-front for it, and  we'll give you a form to make a claim for reimbursement from your medicare part d carrier. You should get a colonoscopy, as it reduces your chances of getting cancer. (update: i left message on phone-tree:  rx as we discussed.  Next time, we'll also check fructosamine.   You should consider resin for chol.)

## 2011-05-12 NOTE — Progress Notes (Signed)
Subjective:    Patient ID: Henry [name], male    DOB: 1941-03-24, 70 y.o.   MRN: [id number]  HPI Subjective:   Patient here for Medicare annual wellness visit and management of other chronic and acute problems.     Risk factors: advanced age    Roster of Physicians Providing Medical Care to Patient: Cardiol: kelly Opthal: grievens Nephrol: igwemizzi Ent: krause  Activities of Daily Living: In your present state of health, do you have any difficulty performing the following activities?:  Preparing food and eating?: No  Bathing yourself: No  Getting dressed: No  Using the toilet:No  Moving around from place to place: No  In the past year have you fallen or had a near fall? :No    Home Safety: Has smoke detector and wears seat belts. No firearms. No excess sun exposure.  Diet and Exercise  Current exercise habits:  Pt says good Dietary issues discussed: pt reports healthy diet   Depression Screen  Q1: Over the past two weeks, have you felt down, depressed or hopeless?no  Q2: Over the past two weeks, have you felt little interest or pleasure in doing things? no   The following portions of the patient's history were reviewed and updated as appropriate: allergies, current medications, past family history, past medical history, past social history, past surgical history and problem list.  Past Medical History  Diagnosis Date  . ANEMIA-NOS 07/10/2007  . CORONARY ARTERY DISEASE 07/10/2007  . DIABETES MELLITUS, TYPE I 07/10/2007  . HYPERLIPIDEMIA 07/10/2007  . HYPERTENSION 07/10/2007  . RENAL INSUFFICIENCY 07/10/2007  . 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 Coronary artery bypass graft   . Appendectomy   . Carotid endarterectomy     right    History   Social History  . Marital Status: Married    Spouse Name: N/A    Number of Children: N/A  . Years of Education: N/A   Occupational History  . Not on file.   Social  History Main Topics  . Smoking status: Never Smoker   . Smokeless tobacco: Not on file  . Alcohol Use: No  . Drug Use: No  . Sexually Active:    Other Topics Concern  . Not on file   Social History Narrative   Says his diet and exercise are very good    Current Outpatient Prescriptions on File Prior to Visit  Medication Sig Dispense Refill  . aspirin 81 MG tablet Take 81 mg by mouth daily.        . B Complex-C-Folic Acid (FOLBEE PLUS) TABS Take 1 tablet by mouth daily.        . carvedilol (COREG) 25 MG tablet Take 25 mg by mouth 2 (two) times daily with a meal.        . Cholecalciferol (VITAMIN D) 2000 UNITS CAPS Take 2 capsules by mouth daily.        . cloNIDine (CATAPRES) 0.1 MG tablet Take 1/2 tablet by mouth two times a day       . hydrochlorothiazide 25 MG tablet Take 25 mg by mouth daily.        . insulin glargine (LANTUS) 100 UNIT/ML injection Inject 19 units at night as directed  10 mL  5  . insulin lispro (HUMALOG) 100 UNIT/ML injection Inject into the skin 3 (three) times daily before meals. 8 units with breakfast, 8 units with lunch, 15 units with dinner       .  isosorbide mononitrate (IMDUR) 60 MG 24 hr tablet Take 60 mg by mouth daily.        Marland Kitchen omeprazole (PRILOSEC) 20 MG capsule Take 20 mg by mouth daily.          Allergies  Allergen Reactions  . Amlodipine Besylate   . Codeine   . Penicillins   . Valsartan     Family History  Problem Relation Age of Onset  . Cancer Neg Hx     BP 142/60  Pulse 60  Temp(Src) 97.9 F (36.6 C) (Oral)  Ht 5\' 10"  (1.778 m)  Wt 158 lb 1.9 oz (71.723 kg)  BMI 22.69 kg/m2  SpO2 99%   Review of Systems  Denies hearing loss, and visual loss Objective:   Vision:  Sees opthalmologist Hearing: grossly normal Body mass index:  See vs page Msk: pt easily and quickly performs "get-up-and-go" from a sitting position Cognitive Impairment Assessment: cognition, memory and judgment appear normal.  remembers 3/3 at 5 minutes.   excellent recall.  can easily read and write a sentence.  alert and oriented x 3   Assessment:   Medicare wellness utd on preventive parameters    Plan:   During the course of the visit the patient was educated and counseled about appropriate screening and preventive services including:       Fall prevention   Screening mammography  Bone densitometry screening  Diabetes screening  Nutrition counseling   Vaccines / LABS Zostavax / Pnemonccoal Vaccine  today  PSA  Patient Instructions (the written plan) was given to the patient.        Review of Systems     Objective:   Physical Exam          Assessment & Plan:  SEPARATE EVALUATION FOLLOWS--EACH PROBLEM HERE IS NEW, NOT RESPONDING TO TREATMENT, OR POSES SIGNIFICANT RISK TO THE PATIENT'S HEALTH: HISTORY OF THE PRESENT ILLNESS: He says statins caused myalgias. he brings a record of his cbg's which i have reviewed today.  It varies from 90-170, with no trend throughout the day.  Denies chest pain and sob and n/v PAST MEDICAL HISTORY reviewed and up to date today REVIEW OF SYSTEMS: denies hypoglycemia PHYSICAL EXAMINATION: Pulses: dorsalis pedis intact bilat.   Legs: there is a healed vein harvest site at the left leg Feet: no deformity.  no ulcer on the feet.  feet are of normal temp.  no edema.  There is spotty rust-color at the legs and feet. Neuro: sensation is intact to touch on the feet LAB/XRAY RESULTS: a1c and lipids are noted IMPRESSION: Dyslipidemia, therapy is limited by perceived drug intolerance Dm.  Therapy limited by variability of cbg's.  It is possible that fructosamine would show better control PLAN: See instruction page

## 2011-05-12 NOTE — Op Note (Signed)
[name]. [facility]  Patient:    Henry [name], Henry [name] Visit Number: [id number] MRN: [id number]          Service Type: DSU Location: [facility] 2899 15 Attending Physician:  Ivor Messier Dictated by:   Guadelupe Sabin, M.D. Proc. Date: 12/06/01 Admit Date:  12/06/2001   CC:         Jeannett Senior A. Evlyn Kanner, M.D.   Operative Report  PREOPERATIVE DIAGNOSIS: Posterior subcapsular cataract, right eye.  POSTOPERATIVE DIAGNOSIS: Posterior subcapsular cataract, right eye.  OPERATION/PROCEDURE: Planned extracapsular cataract extraction with phacoemulsification and primary insertion of posterior chamber intraocular lens implant.  SURGEON: Guadelupe Sabin, M.D.  ASSISTANT: Nurse.  ANESTHESIA: Local 4% xylocaine, 0.75% Marcaine.  Anesthesia standby required. Patient given sodium pentothal intravenously during the period of retrobulbar injection.  DESCRIPTION OF PROCEDURE: After the patient was prepped and draped a lid speculum was inserted in the right eye.  The eye was turned downward and a superior rectus traction suture placed.  A peritomy was performed adjacent to the limbus from the 11 oclock to one oclock position.  The corneoscleral junction was cleaned and a corneoscleral groove made with a 45 degree Superblade.  The anterior chamber was then entered with the 2.5 mm diamond keratome at the 12 oclock position and the 15 degree blade at the 2:30 oclock position.  Using a bent 26 gauge needle on a Healon syringe a circular capsulorrhexis was begun, and then completed with Grabow forceps. Hydrodissection and hydrodelineation were performed using 1% xylocaine.  A 30 degree phacoemulsification tip was then inserted, with slow controlled emulsification of the lens nucleus.  Total ultrasonic time, one minute 13 seconds.  Average power level 22%.  Total amount of fluid used, 80 cc. Following removal of the nucleus the residual cortex was aspirated with  the irrigation-aspiration tip.  The posterior capsule appeared intact with a brilliant red fundus reflex.  It was therefore elected to insert an Aller[employer] Optics model SI40MB three-piece silicone posterior chamber intraocular lens implant with UV absorber, diopter strength +16.00.  This was inserted with the McDonalds forceps into the anterior chamber and then centered into the capsular bag using the Lister lens rotator.  The lens appeared to be well centered.  The Healon which had been used during the procedure was then aspirated and replaced with balanced salt solution and Miochol ophthalmic solution.  The operative incisions appeared to be self-sealing and no sutures were required.  Maxitrol ointment was instilled in the conjunctival cul-de-sac and a light patch and protective shield applied. Duration of procedure and anesthesia administration, 45 minutes.  The patient tolerated the procedure well in general and left the operating room for the recovery room in good condition. Dictated by:   Guadelupe Sabin, M.D. Attending Physician:  Ivor Messier DD:  12/06/01 TD:  12/06/01 Job: [id number]

## 2011-05-12 NOTE — Assessment & Plan Note (Signed)
[facility]                                 ON-CALL NOTE   Henry [name], Henry [name]                   MRN:          [id number]  DATE:03/22/2008                            DOB:          Jul 05, 1941    Parks Ranger, the patient's wife called from [phone] at 10:43 p.m. on  March 22, 2008, stating that her husband has had nausea and vomiting  since 5:30 this evening.  He is a type 1 diabetic and sees Sean A.  Everardo All, M.D.  He has been unable to hold anything down, but all she  really has in the house is Gingerale for him and he is unable to eat  anything solid.  I recommended that she let the Gingerale sit out so the  carbonation is not so strong and to have him try to sip on the Gingerale  very slowly.  His blood sugars are not very high for him, although she  cannot give me a number, and he is now having diarrhea as well.  She  will try to give Gingerale after it has been sitting out a little bit  and let him sip on it and he will take a lukewarm bath or shower to try  to get the temperature down until he is able to hold something down and  take some Tylenol.  I did recommend that he be taken to the emergency  room if she is unable to get any liquids in him tonight at all and the  patient understood.  She said she would try the things that we  previously discussed and then take him to the emergency room if he  needed.  If he does okay overnight, she will call Dr. George Hugh office  in the morning, so he can be evaluated in the office.     Lelon Perla, DO  Electronically Signed    Shawnie Dapper  DD: 03/22/2008  DT: 03/22/2008  Job #: [id number]   cc:   Gregary Signs A. Everardo All, MD

## 2011-05-12 NOTE — H&P (Signed)
[place]. [facility]  Patient:    Henry [name], Henry [name]                        MRN: [id number] Adm. Date:  [id number] Disc. Date: [id number] Attending:  Ivor Messier CC:         Tera Mater. Evlyn Kanner, M.D.                         History and Physical  CHIEF COMPLAINT:  This was a planned outpatient surgical admission of this 70 year old white male, admitted for cataract implant surgery of the left eye.  HISTORY OF PRESENT ILLNESS:  This patient has been followed in my office since uly 20, 1981 when he was referred by Dr. Elder Love, his endocrinologist, for evaluation of diabetic retinopathy.  Initial examination at that time revealed  visual acuity of less than 20/400 in each eye without correction and 20/20 in each eye with correction.  Detailed fundus examination revealed moderate background diabetic retinopathy in both eyes.  Over the ensuing years the patient has been  followed and had been noted to have progression in his diabetic retinopathy. Sessions of focal laser photocoagulation were performed beginning in 1982 to both eyes.  Later additional applications were made in a panretinal laser scatter photocoagulation pattern.  The patient was able to maintain relatively good vision. Early cataract formation was noted in 1998, nuclear type, and this has progressed with additional posterior subcapsular changes.  By January 2001 vision had been  reduced to 20/25 right eye and 20/60 left eye, with best correction in the patient. The patient stated that he was having difficulty with smoky, blurred, or dim vision, difficulty with driving, road signs, difficulty with television, difficulty reading, difficulty seeing at night, and had noted color dimness and dullness. For these reasons and others he elected to proceed with cataract implant surgery at  this time, and signed an informed consent.  Arrangements were made for his outpatient  admission.  PAST MEDICAL HISTORY:  The patient is an insulin-dependent diabetic and is being followed by his regular endocrinologist, Dr. Evlyn Kanner.  Dr. Evlyn Kanner feels the patients general health is good with stable cardiorespiratory system.  The patient has had arteriosclerotic heart disease, coronary artery disease, and coronary artery bypass surgery.  CURRENT MEDICATIONS:  Include insulin, Altace, Toprol, hydrochlorothiazide, and  Bechol.  He is felt to an average risk for the stated operative procedure.  PHYSICAL EXAMINATION:  VITAL SIGNS:  As recorded on admission.  Blood pressure 174/83, heart rate 75, respirations 16, temperature 97.5 degrees.  GENERAL:  The patient is a pleasant, well-developed, well-nourished white male n no acute ocular distress.  HEENT:  Eyes:  Visual acuity is as noted above.  Slit lamp examination reveals he eyes to be white and clear with a clear cornea, deep and clear anterior chamber. The left eye reveals considerable nuclear and posterior subcapsular cataract change.  The right eye reveals nuclear cataract change.  Fundus examination reveals a clear vitreous, attached retina, with panretinal laser photocoagulation scarring.  There is no active diabetic proliferative retinopathy at the present time. There is no hemorrhage or exudate and the macular area appears clear.  CHEST:  Lungs clear to auscultation and percussion.  HEART:  Normal sinus rhythm.  No cardiomegaly.  No murmurs.  ABDOMEN:  Negative.  EXTREMITIES:  Negative.  ADMISSION DIAGNOSIS: 1.  Posterior subcapsular cataract, left eye. 2.  Insulin-dependent diabetes mellitus.  3.  Proliferative diabetic retinopathy, both eyes. 4.  Status post panretinal laser photocoagulation, both eyes.  SURGICAL PLAN:  Planned extracapsular cataract extract - phacoemulsification, primary insertion of posterior chamber intraocular lens implant. DD:  02/11/00 TD:  02/11/00 Job: [id number]

## 2011-05-12 NOTE — Op Note (Signed)
[place]. [facility]  Patient:    Henry [name], Henry [name]                        MRN: [id number] Proc. Date: 02/03/00 Adm. Date:  [id number] Disc. Date: [id number] Attending:  Ivor Messier CC:         Tera Mater. Evlyn Kanner, M.D.                           Operative Report  PREOPERATIVE DIAGNOSIS:  Posterior subcapsular nuclear cataract, left eye; proliferative diabetic retinopathy, left eye; status post panretinal laser photocoagulation, left eye.  POSTOPERATIVE DIAGNOSIS:  Posterior subcapsular nuclear cataract, left eye; proliferative diabetic retinopathy, left eye; status post panretinal laser photocoagulation, left eye.  OPERATION/PROCEDURE:  Planned extracapsular cataract extraction and phacoemulsification, primary insertion of posterior chamber intraocular lens implant.  SURGEON:  Guadelupe Sabin, M.D.  ASSISTANT:  Nurse.  ANESTHESIA:  Local 4% xylocaine, 0.75% Marcaine retrobulbar block, topical tetracaine, intraocular xylocaine.  Anesthesia standby required.  Patient given  sodium pentothal intravenously during the period of retrobulbar injection.  DESCRIPTION OF PROCEDURE:  After the patient was prepped and draped a speculum as inserted in the left eye.  The eye was turned downward and a superior rectus traction suture placed.  Schiotz tenometry was recorded at 7 to 8 scale units with a 5.5 gram weight.  A peritomy was performed adjacent to the limbus from the 11 to 1 oclock position.  The corneoscleral junction was cleaned and a corneoscleral groove made with a 45 degree Superblade.  The anterior chamber was then entered  with the diamond 2.5 mm keratome at the 12 oclock position and the 15 degree blade at the 2:30 oclock position.  Using a bent 26 gauge needle on a Healon syringe  circular capsulorrhexis was begun and then completed with the Grabo forceps. Hydrodissection and hydrodelineation were performed using 1% xylocaine.  A  30 degree phacoemulsification tip was inserted with slow controlled emulsification of the lens nucleus.  There was some excessive leakage of fluid from the side port  incision at the 2:30 oclock position and a single suture of 7-0 Vicryl was used to close the incision.  Following removal of the nucleus the residual cortex was aspirated.  The posterior capsule appeared intact with a brilliant red fundus reflex.  It was therefore elected to insert an Allergan Medical Optics SI40-MB silicone posterior chamber intraocular lens implant with UV absorber.  Diopter strength +18.50.  This was inserted with the [employer] forceps into the anterior chamber, folded, then opened.  The implant was then centered into the capsular ag using the Lister lens rotator.  The lens appeared to be well centered.  The Healon which had been used during the procedure was then aspirated and replaced with balanced salt solution and a small amount of Miochol ophthalmic solution to stimulate pupillary constriction.  The superior larger operative incision appeared to be sealed and did not require suturing.  Eserine ophthalmic ointment was instilled in the conjunctival cul-de-sac and a light patch and protective shield applied to the operated left eye.  Duration of procedure and anesthesia administration was 45 minutes.  The patient tolerated the procedure well in general and left the operating room for the recovery room in good condition.DD: 02/11/00 TD:  02/11/00 Job: [id number]

## 2011-05-12 NOTE — Discharge Summary (Signed)
NAMEGURSHAN, Henry [name] NO.:  [id number]   MEDICAL RECORD NO.:  [id number]          PATIENT TYPE:  INP   LOCATION:  5705                         FACILITY:  [facility]   PHYSICIAN:  Carolan Shiver, M.D.    DATE OF BIRTH:  05-Feb-1941   DATE OF ADMISSION:  02/21/2006  DATE OF DISCHARGE:  02/22/2006                                 DISCHARGE SUMMARY   ADMISSION DIAGNOSES:  1.  Moderately severe mixed hearing loss, left ear, secondary to      otosclerosis, left ear.  2.  Insulin-dependent diabetes mellitus.  3.  Status post 5 vessel coronary artery bypass grafting and right carotid      endarterectomy with history of renal failure.   DISCHARGE DIAGNOSES:  1.  Moderately severe mixed hearing loss, left ear, secondary to      otosclerosis, left ear.  2.  Insulin-dependent diabetes mellitus.  3.  Status post 5 vessel coronary artery bypass grafting and right carotid      endarterectomy with history of renal failure.   OPERATION:  Stapedectomy left ear with 4.0 mm titanium Robinson prosthesis  with a large well narrow shaft and a vein graft.   SURGEON:  Ermalinda Barrios, M.D.   ANESTHESIA:  General, endotracheal. Dr. Bedelia Person.   COMPLICATIONS:  None.   CONDITION ON DISCHARGE:  Stable.   HISTORY OF PRESENT ILLNESS:  Henry Less. is a 70 year old white  male, insulin-dependent diabetic, cared for by Dr. Daphene Jaeger of Cardiology  and Dr. Romero Belling of Endocrinology, who has developed a many year history  of progressive hearing loss. Henry [name] was first seen by me on April 05, 1992 with a history of hearing loss over a 15 to 18 month period. At that  time, he had been an insulin dependent diabetic for 31 years. Initially his  hearing loss was sensorineural and by the year 2000, he developed some air  bone gap and it was looking more like otosclerosis. 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  At that time, his  medical condition was not stable. He had coronary artery  disease and underwent a 5 vessel CABG and a right carotid endarterectomy. He  had some renal failure during one of the hospitalization, and his kidneys  have recovered. He is not on dialysis.   Over the last several years, his hearing has deteriorated. He had been  wearing  binaural BTE hearing aids and he was now recommended for an  exploratory tympanotomy left ear and probable stapedectomy, left ear. Risks  and complications of stapedectomy were explained to him. Questions were  invited and answered and informed consent was signed and witnessed. He was  informed that a titanium Robinson prosthesis plus a vein graft technique  would be used for the stapedectomy. He was scheduled for [facility]  main OR Room #3 on February 21, 2006 at 8:30 a.m.   HOSPITAL COURSE:  On February 21, 2006, Henry [name] was taken to [facility] main OR. His  glucose in the morning was over 350 and he was placed  on Glucomander. He underwent an uncomplicated endotracheal anesthesia. He  did have an anterior larynx and it was difficult to intubate. A glide scope  was used during the intubation by anesthesia.   Intra-operatively, he was found to have a blue foot plate and very thick  stapes with a mobile malleus and incus, a facial nerve covered by bone and  normal middle ear anatomy, otherwise. His chorda tympani  nerve was  dissected free and preserved. He underwent an uncomplicated stapedectomy  with the posterior one-half of his foot plate removed, vein graft placed,  and a 4.0 mm titanium Robinson prosthesis with a large round narrow shaft  was inserted without difficulty.   Postoperatively, he continued to have labile blood glucose levels. He did  require 1/2 an amp of D50 in the PACU, as his glucose had been 58. He  recovered in the PACU without difficulty and was transferred to 5700, Room  5, bed 2. His glucose was monitored and by  early evening, he was in the 125  range. He was eating, awake, and alert and reported that he was hearing from  his left ear.   By the first postoperative morning, February 22, 2006, he was awake, alert, and  stable. He had intact facial function without any nystagmus. Complained of  some dysequilibrium upon arising. He had no bleeding from his left ear and  reported again, hearing from his left ear. He was continuing to wear his  hearing aid in his right ear. He was recommended for discharge in the  morning of February 22, 2006.   DISCHARGE MEDICATIONS:  1.  Avelox 400 mg p.o. daily x10 days.  2.  Darvocet N-100 #30, 1 to 2 p.o. q.4 to 6 hours p.r.n. pain.  3.  Phenergan suppositories 25 mg 1 p.o. q.6 hours p.r.n. nausea.  4.  Ciprodex drops, 3 drops AST ID x1 week.  Home medications of:  1.  Toprol XL 10 mg p.o. each morning.  2.  Nexium 20 mg p.o. each morning.  3.  Torsemide 20 mg p.o. each morning.  4.  Folbee 1 p.o. each morning.  5.  Cardia 120 mg p.o. each evening.  6.  Vytorin 10/80 1 p.o. each evening.  7.  Home insulin regimen of Humalog 8-5-9 plus 1 unit for each 50 units      above 100 glucose and Lantus insulin 13 units SQ each morning and 13      units SQ each evening.   His blood glucose was 254 on the morning of February 22, 2006 and he was given 7  units of SQ insulin.   DIET:  Regular ADA home diet.   ACTIVITY:  Limited activity for the next week. He is not to lift anything  greater than 20 pounds. Avoid nose blowing or sneezing with his mouth  closed. Avoid water exposure, left ear. He is not to fly in an airplane for  3 weeks.   FOLLOW UP:  Return to the office, scheduled for February 27, 2006 at 1:20 p.m.   ADMISSION LABORATORY DATA:  Hemoglobin was 13.3 and hematocrit 39.9. White  blood cell count was 8,000. Platelet count of 272,000. PT was 12.9 seconds,  PTT 28 seconds and INR 1.0. His chem screen showed a sodium of 140, potassium 3.9, chloride 106, CO2 29, glucose  51. His SGOT was slightly  elevated at 45. SGPT was elevated at 47. The  remainder of his liver function  tests  were within normal limits. Calcium was 8.9. His hemoglobin A1C was  8.1, which is high with the normal range of 4.6 to 6.1. Urinalysis was  unremarkable.   Chest x-ray showed no active disease.   EKG showed a sinus bradycardia.   Preoperative audiometric testing on February 19, 2006 documented bilateral,  moderately severe, mixed hearing losses with SRTs of 50 DB right ear, 55 DB,  left ear, 96% discrimination right ear, 100% discrimination left ear.   STATUS:  At the time of hospitalization, he was in OR Room 3, PACU, in 5700,  Room 5705, Bed 2.   CONDITION ON DISCHARGE:  Stable.           ______________________________  Carolan Shiver, M.D.     EMK/MEDQ  D:  02/22/2006  T:  02/22/2006  Job:  [id number]   cc:   Nicki Guadalajara, M.D.  Fax: [phone]   Sean A. Everardo All, M.D. [facility]  [address]  [place]  [place] [address]

## 2011-05-19 ENCOUNTER — Encounter: Payer: Self-pay | Admitting: Endocrinology

## 2011-07-12 ENCOUNTER — Encounter: Payer: Self-pay | Admitting: Podiatry

## 2011-08-18 ENCOUNTER — Encounter: Payer: Self-pay | Admitting: Endocrinology

## 2011-08-18 ENCOUNTER — Ambulatory Visit (INDEPENDENT_AMBULATORY_CARE_PROVIDER_SITE_OTHER): Payer: Medicare Other | Admitting: Endocrinology

## 2011-08-18 ENCOUNTER — Other Ambulatory Visit (INDEPENDENT_AMBULATORY_CARE_PROVIDER_SITE_OTHER): Payer: Medicare Other

## 2011-08-18 VITALS — BP 130/82 | HR 58 | Temp 98.3°F | Ht 70.0 in | Wt 166.4 lb

## 2011-08-18 DIAGNOSIS — E109 Type 1 diabetes mellitus without complications: Secondary | ICD-10-CM

## 2011-08-18 LAB — HEMOGLOBIN A1C: Hgb A1c MFr Bld: 9.2 % — ABNORMAL HIGH (ref 4.6–6.5)

## 2011-08-18 NOTE — Progress Notes (Signed)
Subjective:    Patient ID: Henry [name], male    DOB: [birth year], 70 y.o.   MRN: [id number]  HPI pt states he feels well in general.  he brings a record of his cbg's which i have reviewed today.  It varies from 83-270, but most ar in the 100's.  There is no trend throughout the day.  He says he is having mild but frequent hypoglycemia after evening meal.  He then has to eat something, which causes cbg to be over 200 at hs.  He also has hypoglycemia with exertion.   Past Medical History  Diagnosis Date  . ANEMIA-NOS 07/10/2007  . CORONARY ARTERY DISEASE 07/10/2007  . DIABETES MELLITUS, TYPE I 07/10/2007  . HYPERLIPIDEMIA 07/10/2007  . HYPERTENSION 07/10/2007  . RENAL INSUFFICIENCY 07/10/2007  . 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 Coronary artery bypass graft   . Appendectomy   . Carotid endarterectomy     right    History   Social History  . Marital Status: Married    Spouse Name: N/A    Number of Children: N/A  . Years of Education: N/A   Occupational History  . Not on file.   Social History Main Topics  . Smoking status: Never Smoker   . Smokeless tobacco: Not on file  . Alcohol Use: No  . Drug Use: No  . Sexually Active:    Other Topics Concern  . Not on file   Social History Narrative   Says his diet and exercise are very good    Current Outpatient Prescriptions on File Prior to Visit  Medication Sig Dispense Refill  . aspirin 81 MG tablet Take 81 mg by mouth daily.        . B Complex-C-Folic Acid (FOLBEE PLUS) TABS Take 1 tablet by mouth daily.        . carvedilol (COREG) 25 MG tablet Take 25 mg by mouth 2 (two) times daily with a meal.        . Cholecalciferol (VITAMIN D) 2000 UNITS CAPS Take 1 capsule by mouth daily.       . cloNIDine (CATAPRES) 0.1 MG tablet Take 1/2 tablet by mouth two times a day       . hydrochlorothiazide 25 MG tablet Take 12.5 mg by mouth daily.       . insulin lispro (HUMALOG) 100  UNIT/ML injection Inject into the skin 3 (three) times daily before meals. 8 units with breakfast, 8 units with lunch, 14 units with dinner      . isosorbide mononitrate (IMDUR) 60 MG 24 hr tablet Take 60 mg by mouth daily.        Marland Kitchen omeprazole (PRILOSEC) 20 MG capsule Take 20 mg by mouth daily.          Allergies  Allergen Reactions  . Amlodipine Besylate   . Codeine   . Lipitor (Atorvastatin Calcium)     myalgias  . Penicillins   . Valsartan     Family History  Problem Relation Age of Onset  . Cancer Neg Hx     BP 130/82  Pulse 58  Temp(Src) 98.3 F (36.8 C) (Oral)  Ht 5\' 10"  (1.778 m)  Wt 166 lb 6.4 oz (75.479 kg)  BMI 23.88 kg/m2  SpO2 97%    Review of Systems Denies loc    Objective:   Physical Exam GENERAL: no distress SKIN: Insulin injection sites at the anterior abdomen are normal.  Lab Results  Component Value Date   HGBA1C 9.2* 08/18/2011  Fructosamine= 354 (converts to a1c of 7.8)    Assessment & Plan:  Type 1 dm.  Fructosamine implies better control than a1c does.  Due to advanced age and variable cbg's, he is not a candidate for a1c < 7

## 2011-08-18 NOTE — Patient Instructions (Addendum)
blood tests are being ordered for you today.  please call [phone] to hear your test results.  You will be prompted to enter the 9-digit "MRN" number that appears at the top left of this page, followed by #.  Then you will hear the message. pending the test results, please continue lantus 19 units at night, and reduce humalog to (just before each meal) 08-01-13.   if you are able to anticipate exercise over the next few hours, subtract 4 units humalog from that shot.  if you cannot anticipate the exertion, eat a light snack with the exertion.   if 200-249, add 1 unit humalog.  if 250 or over, take 2 extra units.   (update: i left message on phone-tree:  rx as we discussed)

## 2011-08-19 LAB — FRUCTOSAMINE: Fructosamine: 354 umol/L — ABNORMAL HIGH (ref <285)

## 2011-09-15 LAB — CBC
HCT: 37.3 — ABNORMAL LOW
MCHC: 34.4
MCV: 90.2
Platelets: 248
RDW: 12.7

## 2011-09-15 LAB — URINALYSIS, ROUTINE W REFLEX MICROSCOPIC
Bilirubin Urine: NEGATIVE
Ketones, ur: NEGATIVE
Nitrite: NEGATIVE
Specific Gravity, Urine: 1.018
Urobilinogen, UA: 1
pH: 7

## 2011-09-15 LAB — COMPREHENSIVE METABOLIC PANEL
AST: 35
Albumin: 4.1
BUN: 22
Calcium: 9.5
Chloride: 108
Creatinine, Ser: 1.07
GFR calc Af Amer: >60
Total Protein: 7.5

## 2011-09-15 LAB — DIFFERENTIAL
Basophils Absolute: 0
Eosinophils Relative: 3
Lymphocytes Relative: 27
Lymphs Abs: 2.2
Monocytes Absolute: 0.9
Neutro Abs: 4.7

## 2011-09-15 LAB — PROTIME-INR: INR: 1

## 2011-09-15 LAB — APTT: aPTT: 27

## 2011-11-10 ENCOUNTER — Other Ambulatory Visit (INDEPENDENT_AMBULATORY_CARE_PROVIDER_SITE_OTHER): Payer: Medicare Other

## 2011-11-10 ENCOUNTER — Ambulatory Visit (INDEPENDENT_AMBULATORY_CARE_PROVIDER_SITE_OTHER): Payer: Medicare Other | Admitting: Endocrinology

## 2011-11-10 ENCOUNTER — Encounter: Payer: Self-pay | Admitting: Endocrinology

## 2011-11-10 VITALS — BP 170/68 | HR 72 | Temp 98.6°F | Wt 166.1 lb

## 2011-11-10 DIAGNOSIS — E109 Type 1 diabetes mellitus without complications: Secondary | ICD-10-CM

## 2011-11-10 NOTE — Progress Notes (Signed)
Subjective:    Patient ID: Henry [name], male    DOB: January 19, 1941, 70 y.o.   MRN: [id number]  HPI Pt returns for f/u of type 1 dm (1961).  pt states he feels well in general.  he brings a record of his cbg's which i have reviewed today.  It varies from 80-250, but mot are in the 100's.  There is no trend throughout the day.   Past Medical History  Diagnosis Date  . ANEMIA-NOS 07/10/2007  . CORONARY ARTERY DISEASE 07/10/2007  . DIABETES MELLITUS, TYPE I 07/10/2007  . HYPERLIPIDEMIA 07/10/2007  . HYPERTENSION 07/10/2007  . RENAL INSUFFICIENCY 07/10/2007  . 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 Coronary artery bypass graft   . Appendectomy   . Carotid endarterectomy     right    History   Social History  . Marital Status: Married    Spouse Name: N/A    Number of Children: N/A  . Years of Education: N/A   Occupational History  . Not on file.   Social History Main Topics  . Smoking status: Never Smoker   . Smokeless tobacco: Not on file  . Alcohol Use: No  . Drug Use: No  . Sexually Active:    Other Topics Concern  . Not on file   Social History Narrative   Says his diet and exercise are very good    Current Outpatient Prescriptions on File Prior to Visit  Medication Sig Dispense Refill  . aspirin 81 MG tablet Take 81 mg by mouth daily.        . B Complex-C-Folic Acid (FOLBEE PLUS) TABS Take 1 tablet by mouth daily.        . carvedilol (COREG) 25 MG tablet Take 25 mg by mouth 2 (two) times daily with a meal.        . Cholecalciferol (VITAMIN D) 2000 UNITS CAPS Take 1 capsule by mouth daily.       . cloNIDine (CATAPRES) 0.1 MG tablet Take 1/2 tablet by mouth three times a day      . hydrochlorothiazide 25 MG tablet Take 25 mg by mouth daily.       . insulin glargine (LANTUS) 100 UNIT/ML injection Inject 19 units at bedtime       . insulin lispro (HUMALOG) 100 UNIT/ML injection Inject into the skin 3 (three) times daily  before meals. 8 units with breakfast, 8 units with lunch, 14 units with dinner      . isosorbide mononitrate (IMDUR) 60 MG 24 hr tablet Take 60 mg by mouth daily.        Marland Kitchen omeprazole (PRILOSEC) 20 MG capsule Take 20 mg by mouth daily.          Allergies  Allergen Reactions  . Amlodipine Besylate   . Codeine   . Lipitor (Atorvastatin Calcium)     myalgias  . Penicillins   . Valsartan     Family History  Problem Relation Age of Onset  . Cancer Neg Hx    BP 170/68  Pulse 72  Temp(Src) 98.6 F (37 C) (Oral)  Wt 166 lb 1.9 oz (75.352 kg)  SpO2 98%  Review of Systems denies hypoglycemia    Objective:   Physical Exam VITAL SIGNS:  See vs page GENERAL: no distress Pulses: dorsalis pedis intact bilat.   Feet: no deformity.  no ulcer on the feet.  feet are of normal color and temp.  Trace  bilat leg edema.  There is an old healed vein harvest scar at the left leg.   Neuro: sensation is intact to touch on the feet     Assessment & Plan:  Type 1 dm. Due to advanced age and variable cbg's, he is not a candidate for a1c < 7.  He may benefit from a continuous glucose monitor.

## 2011-11-10 NOTE — Patient Instructions (Addendum)
blood tests are being ordered for you today.  please call [phone] to hear your test results.  You will be prompted to enter the 9-digit "MRN" number that appears at the top left of this page, followed by #.  Then you will hear the message. pending the test results, please continue lantus 19 units at night, and reduce humalog to (just before each meal) 08-01-13.   if you are able to anticipate exercise over the next few hours, subtract 4 units humalog from that shot.  if you cannot anticipate the exertion, eat a light snack with the exertion.   if 200-249, add 1 unit humalog.  if 250 or over, take 2 extra units.   Please come back for a follow-up appointment in 3 months. You should consider having a continuous glucose monitor.  Let me know if you want to see a diabetes educator to learn more.   Please continue to work with drs kell and igwemezzi, on your blood pressure.

## 2011-11-20 ENCOUNTER — Encounter: Payer: Self-pay | Admitting: Endocrinology

## 2012-01-02 DIAGNOSIS — H8000 Otosclerosis involving oval window, nonobliterative, unspecified ear: Secondary | ICD-10-CM | POA: Diagnosis not present

## 2012-01-02 DIAGNOSIS — H903 Sensorineural hearing loss, bilateral: Secondary | ICD-10-CM | POA: Diagnosis not present

## 2012-01-06 ENCOUNTER — Other Ambulatory Visit: Payer: Self-pay | Admitting: Endocrinology

## 2012-02-07 DIAGNOSIS — B351 Tinea unguium: Secondary | ICD-10-CM | POA: Diagnosis not present

## 2012-02-07 DIAGNOSIS — M79609 Pain in unspecified limb: Secondary | ICD-10-CM | POA: Diagnosis not present

## 2012-02-16 ENCOUNTER — Other Ambulatory Visit (INDEPENDENT_AMBULATORY_CARE_PROVIDER_SITE_OTHER): Payer: Medicare Other

## 2012-02-16 ENCOUNTER — Ambulatory Visit (INDEPENDENT_AMBULATORY_CARE_PROVIDER_SITE_OTHER): Payer: Medicare Other | Admitting: Endocrinology

## 2012-02-16 ENCOUNTER — Encounter: Payer: Self-pay | Admitting: Endocrinology

## 2012-02-16 VITALS — BP 118/70 | HR 61 | Temp 98.3°F | Ht 70.0 in | Wt 166.4 lb

## 2012-02-16 DIAGNOSIS — N058 Unspecified nephritic syndrome with other morphologic changes: Secondary | ICD-10-CM

## 2012-02-16 DIAGNOSIS — E1065 Type 1 diabetes mellitus with hyperglycemia: Secondary | ICD-10-CM

## 2012-02-16 DIAGNOSIS — E1029 Type 1 diabetes mellitus with other diabetic kidney complication: Secondary | ICD-10-CM

## 2012-02-16 DIAGNOSIS — IMO0002 Reserved for concepts with insufficient information to code with codable children: Secondary | ICD-10-CM | POA: Insufficient documentation

## 2012-02-16 NOTE — Progress Notes (Signed)
Subjective:    Patient ID: Henry [name], male    DOB: 08/06/1941, 71 y.o.   MRN: [id number]  HPI Pt returns for f/u of type 1 dm (1961).  pt states he feels well in general.  He had to reduce supper humalog to 12 units, due to hypoglycemia at hs.  he brings a record of his cbg's which i have reviewed today.  It varies from 86-298, but most are in the 100's.  There is no trend throughout the day.   Past Medical History  Diagnosis Date  . ANEMIA-NOS 07/10/2007  . CORONARY ARTERY DISEASE 07/10/2007  . DIABETES MELLITUS, TYPE I 07/10/2007  . HYPERLIPIDEMIA 07/10/2007  . HYPERTENSION 07/10/2007  . RENAL INSUFFICIENCY 07/10/2007  . 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 Coronary artery bypass graft   . Appendectomy   . Carotid endarterectomy     right    History   Social History  . Marital Status: Married    Spouse Name: N/A    Number of Children: N/A  . Years of Education: N/A   Occupational History  . Not on file.   Social History Main Topics  . Smoking status: Never Smoker   . Smokeless tobacco: Not on file  . Alcohol Use: No  . Drug Use: No  . Sexually Active:    Other Topics Concern  . Not on file   Social History Narrative   Says his diet and exercise are very good    Current Outpatient Prescriptions on File Prior to Visit  Medication Sig Dispense Refill  . aspirin 81 MG tablet Take 81 mg by mouth daily.        . B Complex-C-Folic Acid (FOLBEE PLUS) TABS Take 1 tablet by mouth daily.        . carvedilol (COREG) 25 MG tablet Take 25 mg by mouth 2 (two) times daily with a meal.        . Cholecalciferol (VITAMIN D) 2000 UNITS CAPS Take 1 capsule by mouth daily.       . cloNIDine (CATAPRES) 0.1 MG tablet Take 1/2 tablet by mouth three times a day      . hydrochlorothiazide 25 MG tablet Take 25 mg by mouth daily.       . insulin glargine (LANTUS) 100 UNIT/ML injection Inject 19 units at bedtime       . isosorbide  mononitrate (IMDUR) 60 MG 24 hr tablet Take 60 mg by mouth daily.        Marland Kitchen LANTUS 100 UNIT/ML injection INJECT 19 UNITS EVERY NIGHT AT BEDTIME AS DIRECTED  10 mL  5  . omeprazole (PRILOSEC) 20 MG capsule Take 20 mg by mouth daily.          Allergies  Allergen Reactions  . Amlodipine Besylate   . Codeine   . Lipitor (Atorvastatin Calcium)     myalgias  . Penicillins   . Valsartan     Family History  Problem Relation Age of Onset  . Cancer Neg Hx     BP 118/70  Pulse 61  Temp(Src) 98.3 F (36.8 C) (Oral)  Ht 5\' 10"  (1.778 m)  Wt 166 lb 6 oz (75.467 kg)  BMI 23.87 kg/m2  SpO2 97%   Review of Systems Denies loc    Objective:   Physical Exam VITAL SIGNS:  See vs page GENERAL: no distress SKIN:  Insulin injection sites at the anterior abdomen are normal.  Lab Results  Component Value Date   HGBA1C 9.2* 02/16/2012      Assessment & Plan:  Type 1 DM.  Therapy limited by long duration of action of insulin (usually assoc with insulin antibodies)

## 2012-02-16 NOTE — Patient Instructions (Addendum)
blood tests are being ordered for you today.  please call [phone] to hear your test results.  You will be prompted to enter the 9-digit "MRN" number that appears at the top left of this page, followed by #.  Then you will hear the message. pending the test results, please continue lantus 19 units at night, and reduce humalog to (just before each meal) 08-02-11.   if you are able to anticipate exercise over the next few hours, subtract 4 units humalog from that shot.  if you cannot anticipate the exertion, eat a light snack with the exertion.   if 200-249, add 1 unit humalog.  if 250 or over, take 2 extra units.   Please come back for a "medicare wellness" appointment in 3 months. You should consider having a continuous glucose monitor.  Let me know if you want to see a diabetes educator to learn more.

## 2012-02-27 DIAGNOSIS — E119 Type 2 diabetes mellitus without complications: Secondary | ICD-10-CM | POA: Diagnosis not present

## 2012-02-27 DIAGNOSIS — I10 Essential (primary) hypertension: Secondary | ICD-10-CM | POA: Diagnosis not present

## 2012-03-04 DIAGNOSIS — N390 Urinary tract infection, site not specified: Secondary | ICD-10-CM | POA: Diagnosis not present

## 2012-03-04 DIAGNOSIS — I2510 Atherosclerotic heart disease of native coronary artery without angina pectoris: Secondary | ICD-10-CM | POA: Diagnosis not present

## 2012-03-04 DIAGNOSIS — I10 Essential (primary) hypertension: Secondary | ICD-10-CM | POA: Diagnosis not present

## 2012-03-06 ENCOUNTER — Encounter: Payer: Self-pay | Admitting: Endocrinology

## 2012-03-06 ENCOUNTER — Ambulatory Visit (INDEPENDENT_AMBULATORY_CARE_PROVIDER_SITE_OTHER): Payer: Medicare Other | Admitting: Endocrinology

## 2012-03-06 VITALS — BP 128/62 | HR 62 | Temp 98.1°F | Ht 70.0 in | Wt 163.8 lb

## 2012-03-06 DIAGNOSIS — J069 Acute upper respiratory infection, unspecified: Secondary | ICD-10-CM | POA: Diagnosis not present

## 2012-03-06 MED ORDER — AZITHROMYCIN 500 MG PO TABS: 500.0000 mg | ORAL_TABLET | Freq: Every day | ORAL | Status: AC

## 2012-03-06 NOTE — Progress Notes (Signed)
Subjective:    Patient ID: Henry [name], male    DOB: 01/20/1941, 71 y.o.   MRN: [id number]  HPI Pt state 10 days of slight pain at the throat, and assoc nasal congestion. Past Medical History  Diagnosis Date  . ANEMIA-NOS 07/10/2007  . CORONARY ARTERY DISEASE 07/10/2007  . DIABETES MELLITUS, TYPE I 07/10/2007  . HYPERLIPIDEMIA 07/10/2007  . HYPERTENSION 07/10/2007  . RENAL INSUFFICIENCY 07/10/2007  . 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 Coronary artery bypass graft   . Appendectomy   . Carotid endarterectomy     right    History   Social History  . Marital Status: Married    Spouse Name: N/A    Number of Children: N/A  . Years of Education: N/A   Occupational History  . Not on file.   Social History Main Topics  . Smoking status: Never Smoker   . Smokeless tobacco: Not on file  . Alcohol Use: No  . Drug Use: No  . Sexually Active:    Other Topics Concern  . Not on file   Social History Narrative   Says his diet and exercise are very good    Current Outpatient Prescriptions on File Prior to Visit  Medication Sig Dispense Refill  . aspirin 81 MG tablet Take 81 mg by mouth daily.        . B Complex-C-Folic Acid (FOLBEE PLUS) TABS Take 1 tablet by mouth daily.        . carvedilol (COREG) 25 MG tablet Take 25 mg by mouth 2 (two) times daily with a meal.        . Cholecalciferol (VITAMIN D) 2000 UNITS CAPS Take 1 capsule by mouth daily.       . cloNIDine (CATAPRES) 0.1 MG tablet Take 1/2 tablet by mouth three times a day      . hydrochlorothiazide 25 MG tablet Take 25 mg by mouth daily.       . insulin lispro (HUMALOG) 100 UNIT/ML injection Inject subcutaneously just before each meal 08-02-11 units. If BS is 200-249 add 1 additional unit. If BS 250 or above add 2 additional units.      . isosorbide mononitrate (IMDUR) 60 MG 24 hr tablet Take 60 mg by mouth daily.        Marland Kitchen LANTUS 100 UNIT/ML injection INJECT 19 UNITS  EVERY NIGHT AT BEDTIME AS DIRECTED  10 mL  5  . omeprazole (PRILOSEC) 20 MG capsule Take 20 mg by mouth daily.          Allergies  Allergen Reactions  . Amlodipine Besylate   . Codeine   . Lipitor (Atorvastatin Calcium)     myalgias  . Penicillins   . Valsartan     Family History  Problem Relation Age of Onset  . Cancer Neg Hx     BP 128/62  Pulse 62  Temp(Src) 98.1 F (36.7 C) (Oral)  Ht 5\' 10"  (1.778 m)  Wt 163 lb 12.8 oz (74.299 kg)  BMI 23.50 kg/m2  SpO2 97%   Review of Systems Fever is resolved.  No earache.  Little if any cough.      Objective:   Physical Exam VITAL SIGNS:  See vs page GENERAL: no distress head: no deformity eyes: no periorbital swelling, no proptosis external nose and ears are normal mouth: no lesion seen Both eac's and tm's are normal LUNGS:  Clear to auscultation     Assessment &  Plan:  URI, new

## 2012-03-06 NOTE — Patient Instructions (Signed)
i have sent a prescription to your pharmacy, for an antibiotic Loratadine-d (non-prescription) will help your congestion. I hope you feel better soon.  If you don't feel better by next week, please call back.   

## 2012-04-10 DIAGNOSIS — M79609 Pain in unspecified limb: Secondary | ICD-10-CM | POA: Diagnosis not present

## 2012-04-10 DIAGNOSIS — B351 Tinea unguium: Secondary | ICD-10-CM | POA: Diagnosis not present

## 2012-04-22 DIAGNOSIS — I10 Essential (primary) hypertension: Secondary | ICD-10-CM | POA: Diagnosis not present

## 2012-04-22 DIAGNOSIS — N390 Urinary tract infection, site not specified: Secondary | ICD-10-CM | POA: Diagnosis not present

## 2012-05-07 DIAGNOSIS — E119 Type 2 diabetes mellitus without complications: Secondary | ICD-10-CM | POA: Diagnosis not present

## 2012-05-07 DIAGNOSIS — E7800 Pure hypercholesterolemia, unspecified: Secondary | ICD-10-CM | POA: Diagnosis not present

## 2012-05-07 DIAGNOSIS — N390 Urinary tract infection, site not specified: Secondary | ICD-10-CM | POA: Diagnosis not present

## 2012-05-16 DIAGNOSIS — E1139 Type 2 diabetes mellitus with other diabetic ophthalmic complication: Secondary | ICD-10-CM | POA: Diagnosis not present

## 2012-05-16 DIAGNOSIS — H579 Unspecified disorder of eye and adnexa: Secondary | ICD-10-CM | POA: Diagnosis not present

## 2012-05-16 DIAGNOSIS — Z961 Presence of intraocular lens: Secondary | ICD-10-CM | POA: Diagnosis not present

## 2012-05-16 DIAGNOSIS — E11359 Type 2 diabetes mellitus with proliferative diabetic retinopathy without macular edema: Secondary | ICD-10-CM | POA: Diagnosis not present

## 2012-05-16 DIAGNOSIS — E11311 Type 2 diabetes mellitus with unspecified diabetic retinopathy with macular edema: Secondary | ICD-10-CM | POA: Diagnosis not present

## 2012-05-17 ENCOUNTER — Encounter: Payer: Self-pay | Admitting: Endocrinology

## 2012-05-17 ENCOUNTER — Ambulatory Visit (INDEPENDENT_AMBULATORY_CARE_PROVIDER_SITE_OTHER): Payer: Medicare Other | Admitting: Endocrinology

## 2012-05-17 ENCOUNTER — Other Ambulatory Visit (INDEPENDENT_AMBULATORY_CARE_PROVIDER_SITE_OTHER): Payer: Medicare Other

## 2012-05-17 VITALS — BP 120/68 | HR 62 | Temp 97.4°F | Ht 70.0 in | Wt 161.0 lb

## 2012-05-17 DIAGNOSIS — E1065 Type 1 diabetes mellitus with hyperglycemia: Secondary | ICD-10-CM | POA: Diagnosis not present

## 2012-05-17 DIAGNOSIS — E7800 Pure hypercholesterolemia, unspecified: Secondary | ICD-10-CM | POA: Diagnosis not present

## 2012-05-17 DIAGNOSIS — I10 Essential (primary) hypertension: Secondary | ICD-10-CM

## 2012-05-17 DIAGNOSIS — N320 Bladder-neck obstruction: Secondary | ICD-10-CM | POA: Diagnosis not present

## 2012-05-17 DIAGNOSIS — Z79899 Other long term (current) drug therapy: Secondary | ICD-10-CM

## 2012-05-17 DIAGNOSIS — Z0000 Encounter for general adult medical examination without abnormal findings: Secondary | ICD-10-CM

## 2012-05-17 DIAGNOSIS — N259 Disorder resulting from impaired renal tubular function, unspecified: Secondary | ICD-10-CM | POA: Diagnosis not present

## 2012-05-17 DIAGNOSIS — D649 Anemia, unspecified: Secondary | ICD-10-CM

## 2012-05-17 DIAGNOSIS — Z125 Encounter for screening for malignant neoplasm of prostate: Secondary | ICD-10-CM

## 2012-05-17 DIAGNOSIS — N058 Unspecified nephritic syndrome with other morphologic changes: Secondary | ICD-10-CM

## 2012-05-17 DIAGNOSIS — E1029 Type 1 diabetes mellitus with other diabetic kidney complication: Secondary | ICD-10-CM

## 2012-05-17 LAB — CBC WITH DIFFERENTIAL/PLATELET
Basophils Absolute: 0 10*3/uL (ref 0.0–0.1)
Eosinophils Absolute: 0.2 10*3/uL (ref 0.0–0.7)
HCT: 35.7 % — ABNORMAL LOW (ref 39.0–52.0)
Lymphs Abs: 1.9 10*3/uL (ref 0.7–4.0)
MCHC: 33.3 g/dL (ref 30.0–36.0)
MCV: 91.4 fl (ref 78.0–100.0)
Monocytes Absolute: 0.6 10*3/uL (ref 0.1–1.0)
Neutrophils Relative %: 56.7 % (ref 43.0–77.0)
Platelets: 236 10*3/uL (ref 150.0–400.0)
RDW: 14.2 % (ref 11.5–14.6)

## 2012-05-17 LAB — URINALYSIS, ROUTINE W REFLEX MICROSCOPIC
Bilirubin Urine: NEGATIVE
Hgb urine dipstick: NEGATIVE
Nitrite: NEGATIVE
Total Protein, Urine: NEGATIVE
Urobilinogen, UA: 0.2 (ref 0.0–1.0)

## 2012-05-17 LAB — LIPID PANEL
Cholesterol: 165 mg/dL (ref 0–200)
LDL Cholesterol: 109 mg/dL — ABNORMAL HIGH (ref 0–99)
Total CHOL/HDL Ratio: 4
VLDL: 16 mg/dL (ref 0.0–40.0)

## 2012-05-17 LAB — HEPATIC FUNCTION PANEL
AST: 34 U/L (ref 0–37)
Albumin: 3.8 g/dL (ref 3.5–5.2)
Alkaline Phosphatase: 77 U/L (ref 39–117)
Total Protein: 6.9 g/dL (ref 6.0–8.3)

## 2012-05-17 LAB — PSA: PSA: 1.02 ng/mL (ref 0.10–4.00)

## 2012-05-17 LAB — MICROALBUMIN / CREATININE URINE RATIO: Creatinine,U: 150.4 mg/dL

## 2012-05-17 LAB — IBC PANEL: Iron: 95 ug/dL (ref 42–165)

## 2012-05-17 NOTE — Progress Notes (Signed)
Subjective:    Patient ID: Henry [name], male    DOB: [birth year], 71 y.o.   MRN: [id number]  HPI The state of at least three ongoing medical problems is addressed today: Pt returns for f/u of type 1 dm (dx'ed 1961; complicated by renal insufficiency, CAD, and retinopathy).  no cbg record, but states cbg's are well-controlled.  denies hypoglycemia Anemia:  No cause has been found Dyslipidemia:  therapy has been limited by multiple perceived drug intolerances Past Medical History  Diagnosis Date  . ANEMIA-NOS 07/10/2007  . CORONARY ARTERY DISEASE 07/10/2007  . DIABETES MELLITUS, TYPE I 07/10/2007  . HYPERLIPIDEMIA 07/10/2007  . HYPERTENSION 07/10/2007  . RENAL INSUFFICIENCY 07/10/2007  . 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 Coronary artery bypass graft   . Appendectomy   . Carotid endarterectomy     right    History   Social History  . Marital Status: Married    Spouse Name: N/A    Number of Children: N/A  . Years of Education: N/A   Occupational History  . Not on file.   Social History Main Topics  . Smoking status: Never Smoker   . Smokeless tobacco: Not on file  . Alcohol Use: No  . Drug Use: No  . Sexually Active:    Other Topics Concern  . Not on file   Social History Narrative   Says his diet and exercise are very good    Current Outpatient Prescriptions on File Prior to Visit  Medication Sig Dispense Refill  . aspirin 81 MG tablet Take 81 mg by mouth daily.        . B Complex-C-Folic Acid (FOLBEE PLUS) TABS Take 1 tablet by mouth daily.        . Cholecalciferol (VITAMIN D) 2000 UNITS CAPS Take 1 capsule by mouth daily.       . cloNIDine (CATAPRES) 0.1 MG tablet Take 1/2 tablet by mouth three times a day      . hydrochlorothiazide 25 MG tablet Take 25 mg by mouth daily.       . insulin lispro (HUMALOG) 100 UNIT/ML injection Inject subcutaneously just before each meal 08-02-11 units. If BS is 200-249 add 1  additional unit. If BS 250 or above add 2 additional units.      . isosorbide mononitrate (IMDUR) 60 MG 24 hr tablet Take 60 mg by mouth daily.        Marland Kitchen labetalol (NORMODYNE) 200 MG tablet Take 200 mg by mouth 2 (two) times daily.      Marland Kitchen LANTUS 100 UNIT/ML injection INJECT 19 UNITS EVERY NIGHT AT BEDTIME AS DIRECTED  10 mL  5  . omeprazole (PRILOSEC) 20 MG capsule Take 20 mg by mouth daily.          Allergies  Allergen Reactions  . Amlodipine Besylate   . Codeine   . Lipitor (Atorvastatin Calcium)     myalgias  . Penicillins   . Valsartan     Family History  Problem Relation Age of Onset  . Cancer Neg Hx     BP 120/68  Pulse 62  Temp(Src) 97.4 F (36.3 C) (Oral)  Ht 5\' 10"  (1.778 m)  Wt 161 lb (73.029 kg)  BMI 23.10 kg/m2  SpO2 97%  Review of Systems  Constitutional: Negative for unexpected weight change.  Respiratory: Negative for shortness of breath.   Genitourinary: Negative for difficulty urinating.      Objective:  Physical Exam VITAL SIGNS: See vs page  GENERAL: no distress  Pulses: dorsalis pedis intact bilat.  Feet: no deformity. no ulcer on the feet. feet are of normal color and temp. Trace bilat leg edema.  There is bilateral onychomycosis.  There is an old healed vein harvest scar at the left leg.  Neuro: sensation is intact to touch on the feet Lab Results  Component Value Date   WBC 6.3 05/17/2012   HGB 11.9* 05/17/2012   HCT 35.7* 05/17/2012   PLT 236.0 05/17/2012   GLUCOSE 108 04/06/2010   CHOL 165 05/17/2012   TRIG 80.0 05/17/2012   HDL 39.90 05/17/2012   LDLCALC 109* 05/17/2012   ALT 25 05/17/2012   AST 34 05/17/2012   NA 128* 03/18/2010   K 3.7 03/18/2010   CL 98 03/18/2010   CREATININE 1.13 04/06/2010   BUN 29 04/06/2010   CO2 20 04/06/2010   TSH 3.00 05/17/2012   PSA 1.02 05/17/2012   INR 1.16 02/28/2010   HGBA1C 9.5* 05/17/2012   MICROALBUR 6.6* 05/17/2012      Assessment & Plan:  DM.  Therapy limited by variable cbg's.  i need cbg info in order  to improve control Anemia, chronic, prob due to renal insuff Dyslipidemia:  therapy is limited by multiple perceived drug intolerances.  Subjective:   Patient here for Medicare annual wellness visit and management of other chronic and acute problems.     Risk factors: advanced age    Roster of Physicians Providing Medical Care to Patient:  See "snapshot"   Activities of Daily Living: In your present state of health, do you have any difficulty performing the following activities?:  Preparing food and eating?: No  Bathing yourself: No  Getting dressed: No  Using the toilet:No  Moving around from place to place: No  In the past year have you fallen or had a near fall?: No    Home Safety: Has smoke detector and wears seat belts. No firearms. No excess sun exposure.  Diet and Exercise  Current exercise habits:  Pt says good Dietary issues discussed: pt reports a healthy diet   Depression Screen  Q1: Over the past two weeks, have you felt down, depressed or hopeless? no  Q2: Over the past two weeks, have you felt little interest or pleasure in doing things? no   The following portions of the patient's history were reviewed and updated as appropriate: allergies, current medications, past family history, past medical history, past social history, past surgical history and problem list.  Past Medical History  Diagnosis Date  . ANEMIA-NOS 07/10/2007  . CORONARY ARTERY DISEASE 07/10/2007  . DIABETES MELLITUS, TYPE I 07/10/2007  . HYPERLIPIDEMIA 07/10/2007  . HYPERTENSION 07/10/2007  . RENAL INSUFFICIENCY 07/10/2007  . 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 Coronary artery bypass graft   . Appendectomy   . Carotid endarterectomy     right    History   Social History  . Marital Status: Married    Spouse Name: N/A    Number of Children: N/A  . Years of Education: N/A   Occupational History  . Not on file.   Social History Main  Topics  . Smoking status: Never Smoker   . Smokeless tobacco: Not on file  . Alcohol Use: No  . Drug Use: No  . Sexually Active:    Other Topics Concern  . Not on file   Social History Narrative  Says his diet and exercise are very good    Current Outpatient Prescriptions on File Prior to Visit  Medication Sig Dispense Refill  . aspirin 81 MG tablet Take 81 mg by mouth daily.        . B Complex-C-Folic Acid (FOLBEE PLUS) TABS Take 1 tablet by mouth daily.        . Cholecalciferol (VITAMIN D) 2000 UNITS CAPS Take 1 capsule by mouth daily.       . cloNIDine (CATAPRES) 0.1 MG tablet Take 1/2 tablet by mouth three times a day      . hydrochlorothiazide 25 MG tablet Take 25 mg by mouth daily.       . insulin lispro (HUMALOG) 100 UNIT/ML injection Inject subcutaneously just before each meal 08-02-11 units. If BS is 200-249 add 1 additional unit. If BS 250 or above add 2 additional units.      . isosorbide mononitrate (IMDUR) 60 MG 24 hr tablet Take 60 mg by mouth daily.        Marland Kitchen labetalol (NORMODYNE) 200 MG tablet Take 200 mg by mouth 2 (two) times daily.      Marland Kitchen LANTUS 100 UNIT/ML injection INJECT 19 UNITS EVERY NIGHT AT BEDTIME AS DIRECTED  10 mL  5  . omeprazole (PRILOSEC) 20 MG capsule Take 20 mg by mouth daily.          Allergies  Allergen Reactions  . Amlodipine Besylate   . Codeine   . Lipitor (Atorvastatin Calcium)     myalgias  . Penicillins   . Valsartan     Family History  Problem Relation Age of Onset  . Cancer Neg Hx     BP 120/68  Pulse 62  Temp(Src) 97.4 F (36.3 C) (Oral)  Ht 5\' 10"  (1.778 m)  Wt 161 lb (73.029 kg)  BMI 23.10 kg/m2  SpO2 97%   Review of Systems  Denies hearing loss, and visual loss Objective:   Vision:  Sees opthalmologist Hearing: grossly normal Body mass index:  See vs page Msk: pt easily and quickly performs "get-up-and-go" from a sitting position Cognitive Impairment Assessment: cognition, memory and judgment appear normal.    remembers 3/3 at 5 minutes.  excellent recall.  can easily read and write a sentence.  alert and oriented x 3   Assessment:   Medicare wellness utd on preventive parameters    Plan:   During the course of the visit the patient was educated and counseled about appropriate screening and preventive services including:        Fall prevention   Diabetes screening  Nutrition counseling   Vaccines / LABS Zostavax / Pnemonccoal Vaccine  today  PSA  Patient Instructions (the written plan) was given to the patient.

## 2012-05-17 NOTE — Patient Instructions (Addendum)
blood tests are being requested for you today.  You will receive a letter with results. please consider these measures for your health:  minimize alcohol.  do not use tobacco products.  have a colonoscopy at least every 10 years from age 71.  keep firearms safely stored.  always use seat belts.  have working smoke alarms in your home.  see an eye doctor and dentist regularly.  never drive under the influence of alcohol or drugs (including prescription drugs).  those with fair skin should take precautions against the sun. Please come back for a follow-up appointment in 3 months.

## 2012-06-03 DIAGNOSIS — I6529 Occlusion and stenosis of unspecified carotid artery: Secondary | ICD-10-CM | POA: Diagnosis not present

## 2012-06-11 DIAGNOSIS — B351 Tinea unguium: Secondary | ICD-10-CM | POA: Diagnosis not present

## 2012-06-11 DIAGNOSIS — M79609 Pain in unspecified limb: Secondary | ICD-10-CM | POA: Diagnosis not present

## 2012-06-13 DIAGNOSIS — I10 Essential (primary) hypertension: Secondary | ICD-10-CM | POA: Diagnosis not present

## 2012-06-13 DIAGNOSIS — E119 Type 2 diabetes mellitus without complications: Secondary | ICD-10-CM | POA: Diagnosis not present

## 2012-06-18 DIAGNOSIS — N390 Urinary tract infection, site not specified: Secondary | ICD-10-CM | POA: Diagnosis not present

## 2012-06-18 DIAGNOSIS — I10 Essential (primary) hypertension: Secondary | ICD-10-CM | POA: Diagnosis not present

## 2012-06-18 DIAGNOSIS — I2510 Atherosclerotic heart disease of native coronary artery without angina pectoris: Secondary | ICD-10-CM | POA: Diagnosis not present

## 2012-07-04 DIAGNOSIS — E119 Type 2 diabetes mellitus without complications: Secondary | ICD-10-CM | POA: Diagnosis not present

## 2012-07-04 DIAGNOSIS — I10 Essential (primary) hypertension: Secondary | ICD-10-CM | POA: Diagnosis not present

## 2012-07-09 DIAGNOSIS — N390 Urinary tract infection, site not specified: Secondary | ICD-10-CM | POA: Diagnosis not present

## 2012-07-09 DIAGNOSIS — I2510 Atherosclerotic heart disease of native coronary artery without angina pectoris: Secondary | ICD-10-CM | POA: Diagnosis not present

## 2012-07-09 DIAGNOSIS — I10 Essential (primary) hypertension: Secondary | ICD-10-CM | POA: Diagnosis not present

## 2012-07-16 DIAGNOSIS — I2510 Atherosclerotic heart disease of native coronary artery without angina pectoris: Secondary | ICD-10-CM | POA: Diagnosis not present

## 2012-07-16 DIAGNOSIS — I119 Hypertensive heart disease without heart failure: Secondary | ICD-10-CM | POA: Diagnosis not present

## 2012-08-13 DIAGNOSIS — M79609 Pain in unspecified limb: Secondary | ICD-10-CM | POA: Diagnosis not present

## 2012-08-13 DIAGNOSIS — B351 Tinea unguium: Secondary | ICD-10-CM | POA: Diagnosis not present

## 2012-08-13 DIAGNOSIS — L84 Corns and callosities: Secondary | ICD-10-CM | POA: Diagnosis not present

## 2012-08-16 ENCOUNTER — Encounter: Payer: Self-pay | Admitting: Endocrinology

## 2012-08-16 ENCOUNTER — Other Ambulatory Visit (INDEPENDENT_AMBULATORY_CARE_PROVIDER_SITE_OTHER): Payer: Medicare Other

## 2012-08-16 ENCOUNTER — Ambulatory Visit (INDEPENDENT_AMBULATORY_CARE_PROVIDER_SITE_OTHER): Payer: Medicare Other | Admitting: Endocrinology

## 2012-08-16 VITALS — BP 122/68 | HR 60 | Temp 97.9°F | Ht 70.0 in | Wt 161.0 lb

## 2012-08-16 DIAGNOSIS — E1065 Type 1 diabetes mellitus with hyperglycemia: Secondary | ICD-10-CM

## 2012-08-16 DIAGNOSIS — E1029 Type 1 diabetes mellitus with other diabetic kidney complication: Secondary | ICD-10-CM

## 2012-08-16 DIAGNOSIS — N058 Unspecified nephritic syndrome with other morphologic changes: Secondary | ICD-10-CM

## 2012-08-16 LAB — HEMOGLOBIN A1C: Hgb A1c MFr Bld: 10.1 % — ABNORMAL HIGH (ref 4.6–6.5)

## 2012-08-16 NOTE — Patient Instructions (Addendum)
blood tests are being requested for you today.  You will receive a letter with results. please consider these measures for your health:  minimize alcohol.  do not use tobacco products.  have a colonoscopy at least every 10 years from age 71.  keep firearms safely stored.  always use seat belts.  have working smoke alarms in your home.  see an eye doctor and dentist regularly.  never drive under the influence of alcohol or drugs (including prescription drugs).  those with fair skin should take precautions against the sun. Please come back for a follow-up appointment in 3 months.   Increase humalog to 3x a day just before each meal 08-01-10 units. Increase lantus to 20 unit at bedtime.

## 2012-08-16 NOTE — Progress Notes (Signed)
Subjective:    Patient ID: Henry [name], male    DOB: [birth year], 71 y.o.   MRN: [id number]  HPI Pt returns for f/u of type 1 dm (dx'ed 1961; complicated by renal insufficiency, CAD, and retinopathy; he has declined pump and continuous glucose monitor).  he brings a record of his cbg's which i have reviewed today.  He is having hypoglycemia after the evening meal.  It is highest in am.   denies hypoglycemia.   Past Medical History  Diagnosis Date  . ANEMIA-NOS 07/10/2007  . CORONARY ARTERY DISEASE 07/10/2007  . DIABETES MELLITUS, TYPE I 07/10/2007  . HYPERLIPIDEMIA 07/10/2007  . HYPERTENSION 07/10/2007  . RENAL INSUFFICIENCY 07/10/2007  . 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 Coronary artery bypass graft   . Appendectomy   . Carotid endarterectomy     right    History   Social History  . Marital Status: Married    Spouse Name: N/A    Number of Children: N/A  . Years of Education: N/A   Occupational History  . Not on file.   Social History Main Topics  . Smoking status: Never Smoker   . Smokeless tobacco: Not on file  . Alcohol Use: No  . Drug Use: No  . Sexually Active:    Other Topics Concern  . Not on file   Social History Narrative   Says his diet and exercise are very good    Current Outpatient Prescriptions on File Prior to Visit  Medication Sig Dispense Refill  . aspirin 81 MG tablet Take 81 mg by mouth daily.        . B Complex-C-Folic Acid (FOLBEE PLUS) TABS Take 1 tablet by mouth daily.        . Cholecalciferol (VITAMIN D) 2000 UNITS CAPS Take 1 capsule by mouth daily.       . cloNIDine (CATAPRES) 0.1 MG tablet Take 1/2 tablet by mouth three times a day      . hydrochlorothiazide 25 MG tablet Take 25 mg by mouth daily.       . insulin glargine (LANTUS) 100 UNIT/ML injection       . insulin lispro (HUMALOG) 100 UNIT/ML injection Inject subcutaneously just before each meal 08-01-10 units. If BS is 200-249  add 1 additional unit. If BS 250 or above add 2 additional units.      . isosorbide mononitrate (IMDUR) 60 MG 24 hr tablet Take 60 mg by mouth daily.        Marland Kitchen labetalol (NORMODYNE) 200 MG tablet Take 200 mg by mouth 2 (two) times daily.      Marland Kitchen omeprazole (PRILOSEC) 20 MG capsule Take 20 mg by mouth daily.          Allergies  Allergen Reactions  . Amlodipine Besylate   . Codeine   . Lipitor (Atorvastatin Calcium)     myalgias  . Penicillins   . Valsartan     Family History  Problem Relation Age of Onset  . Cancer Neg Hx     BP 122/68  Pulse 60  Temp 97.9 F (36.6 C) (Oral)  Ht 5\' 10"  (1.778 m)  Wt 161 lb (73.029 kg)  BMI 23.10 kg/m2  SpO2 97%    Review of Systems Denies LOC    Objective:   Physical Exam Pulses: dorsalis pedis intact bilat.  Feet: no deformity. no ulcer on the feet. feet are of normal color and temp. Trace bilat  leg edema, and patchy hyperpigmentation.  There is bilateral onychomycosis.  There is an old healed vein harvest scar at the left leg.  Neuro: sensation is intact to touch on the feet.    Lab Results  Component Value Date   HGBA1C 10.1* 08/16/2012      Assessment & Plan:  DM: needs increased rx

## 2012-08-20 ENCOUNTER — Telehealth: Payer: Self-pay | Admitting: *Deleted

## 2012-08-20 NOTE — Telephone Encounter (Signed)
Called pt to inform of lab results, line busy/unable to leave message.

## 2012-08-21 NOTE — Telephone Encounter (Signed)
Called home and mobile #'s, both lines busy/unable to leave message.

## 2012-08-22 NOTE — Telephone Encounter (Signed)
Pt's spouse informed of results.

## 2012-09-05 DIAGNOSIS — E11311 Type 2 diabetes mellitus with unspecified diabetic retinopathy with macular edema: Secondary | ICD-10-CM | POA: Diagnosis not present

## 2012-09-05 DIAGNOSIS — E11359 Type 2 diabetes mellitus with proliferative diabetic retinopathy without macular edema: Secondary | ICD-10-CM | POA: Diagnosis not present

## 2012-09-05 DIAGNOSIS — H4920 Sixth [abducent] nerve palsy, unspecified eye: Secondary | ICD-10-CM | POA: Diagnosis not present

## 2012-09-05 DIAGNOSIS — Z961 Presence of intraocular lens: Secondary | ICD-10-CM | POA: Diagnosis not present

## 2012-09-05 DIAGNOSIS — E1139 Type 2 diabetes mellitus with other diabetic ophthalmic complication: Secondary | ICD-10-CM | POA: Diagnosis not present

## 2012-10-09 DIAGNOSIS — I10 Essential (primary) hypertension: Secondary | ICD-10-CM | POA: Diagnosis not present

## 2012-10-09 DIAGNOSIS — E119 Type 2 diabetes mellitus without complications: Secondary | ICD-10-CM | POA: Diagnosis not present

## 2012-10-16 DIAGNOSIS — B351 Tinea unguium: Secondary | ICD-10-CM | POA: Diagnosis not present

## 2012-10-16 DIAGNOSIS — L84 Corns and callosities: Secondary | ICD-10-CM | POA: Diagnosis not present

## 2012-10-23 ENCOUNTER — Other Ambulatory Visit: Payer: Self-pay | Admitting: Endocrinology

## 2012-10-29 DIAGNOSIS — E119 Type 2 diabetes mellitus without complications: Secondary | ICD-10-CM | POA: Diagnosis not present

## 2012-10-29 DIAGNOSIS — N390 Urinary tract infection, site not specified: Secondary | ICD-10-CM | POA: Diagnosis not present

## 2012-10-29 DIAGNOSIS — I10 Essential (primary) hypertension: Secondary | ICD-10-CM | POA: Diagnosis not present

## 2012-11-12 ENCOUNTER — Ambulatory Visit (INDEPENDENT_AMBULATORY_CARE_PROVIDER_SITE_OTHER): Payer: Medicare Other | Admitting: Endocrinology

## 2012-11-12 ENCOUNTER — Encounter: Payer: Self-pay | Admitting: Endocrinology

## 2012-11-12 VITALS — BP 118/68 | HR 77 | Temp 97.9°F | Wt 163.0 lb

## 2012-11-12 DIAGNOSIS — E1065 Type 1 diabetes mellitus with hyperglycemia: Secondary | ICD-10-CM | POA: Diagnosis not present

## 2012-11-12 DIAGNOSIS — E1029 Type 1 diabetes mellitus with other diabetic kidney complication: Secondary | ICD-10-CM

## 2012-11-12 LAB — HEMOGLOBIN A1C: Mean Plasma Glucose: 220 mg/dL — ABNORMAL HIGH (ref <117)

## 2012-11-12 NOTE — Progress Notes (Signed)
  Subjective:    Patient ID: Henry [name], male    DOB: September 25, 1941, 71 y.o.   MRN: [id number]  HPI Pt returns for f/u of type 1 dm (dx'ed 1961; complicated by renal insufficiency, CAD, and retinopathy; he has declined pump and continuous glucose monitor).  he brings a record of his cbg's which i have reviewed today.  He seldom has hypoglycemia.  It is highest at hs.  cbg's vary from 94-200's.  There is otherwise no trend throughout the day. Past Medical History  Diagnosis Date  . ANEMIA-NOS 07/10/2007  . CORONARY ARTERY DISEASE 07/10/2007  . DIABETES MELLITUS, TYPE I 07/10/2007  . HYPERLIPIDEMIA 07/10/2007  . HYPERTENSION 07/10/2007  . RENAL INSUFFICIENCY 07/10/2007  . Proliferative diabetic retinopathy(362.02) 05/05/2010    Past Surgical History  Procedure Date  . Coronary artery bypass graft   . Appendectomy   . Carotid endarterectomy     right    History   Social History  . Marital Status: Married    Spouse Name: N/A    Number of Children: N/A  . Years of Education: N/A   Occupational History  . Not on file.   Social History Main Topics  . Smoking status: Never Smoker   . Smokeless tobacco: Not on file  . Alcohol Use: No  . Drug Use: No  . Sexually Active:    Other Topics Concern  . Not on file   Social History Narrative   Says his diet and exercise are very good    Current Outpatient Prescriptions on File Prior to Visit  Medication Sig Dispense Refill  . aspirin 81 MG tablet Take 81 mg by mouth daily.        . B Complex-C-Folic Acid (FOLBEE PLUS) TABS Take 1 tablet by mouth daily.        . Cholecalciferol (VITAMIN D) 2000 UNITS CAPS Take 1 capsule by mouth daily.       . cloNIDine (CATAPRES) 0.1 MG tablet Take 1/2 tablet by mouth three times a day      . hydrochlorothiazide 25 MG tablet Take 25 mg by mouth daily.       . insulin lispro (HUMALOG) 100 UNIT/ML injection Inject subcutaneously just before each meal 08-02-11 units. If BS is 200 or more, take 1 extra  unit.      . isosorbide mononitrate (IMDUR) 60 MG 24 hr tablet Take 60 mg by mouth daily.        Marland Kitchen labetalol (NORMODYNE) 200 MG tablet Take 200 mg by mouth 2 (two) times daily.      Marland Kitchen omeprazole (PRILOSEC) 20 MG capsule Take 20 mg by mouth daily.          Allergies  Allergen Reactions  . Amlodipine Besylate   . Codeine   . Lipitor (Atorvastatin Calcium)     myalgias  . Penicillins   . Valsartan    Family History  Problem Relation Age of Onset  . Cancer Neg Hx    BP 118/68  Pulse 77  Temp 97.9 F (36.6 C) (Oral)  Wt 163 lb (73.936 kg)  SpO2 97%  Review of Systems Denies weight change    Objective:   Physical Exam VITAL SIGNS:  See vs page GENERAL: no distress SKIN:  Insulin injection sites at the anterior abdomen are normal.    Lab Results  Component Value Date   HGBA1C 9.3* 11/12/2012      Assessment & Plan:  DM, needs increased rx

## 2012-11-12 NOTE — Patient Instructions (Addendum)
blood tests are being requested for you today.  We'll contact you with results.   Please come back for a follow-up appointment in 3 months.   Increase humalog to 3x a day just before each meal 08-02-11 units.  Take 1 extra unit anytime it is over 200.   continue lantus, 20 unit at bedtime.

## 2012-11-14 DIAGNOSIS — Z961 Presence of intraocular lens: Secondary | ICD-10-CM | POA: Diagnosis not present

## 2012-11-14 DIAGNOSIS — H4920 Sixth [abducent] nerve palsy, unspecified eye: Secondary | ICD-10-CM | POA: Diagnosis not present

## 2012-11-14 DIAGNOSIS — E11311 Type 2 diabetes mellitus with unspecified diabetic retinopathy with macular edema: Secondary | ICD-10-CM | POA: Diagnosis not present

## 2012-11-14 DIAGNOSIS — E11359 Type 2 diabetes mellitus with proliferative diabetic retinopathy without macular edema: Secondary | ICD-10-CM | POA: Diagnosis not present

## 2012-12-05 ENCOUNTER — Other Ambulatory Visit: Payer: Self-pay | Admitting: Endocrinology

## 2012-12-20 DIAGNOSIS — M79609 Pain in unspecified limb: Secondary | ICD-10-CM | POA: Diagnosis not present

## 2012-12-20 DIAGNOSIS — B351 Tinea unguium: Secondary | ICD-10-CM | POA: Diagnosis not present

## 2012-12-20 DIAGNOSIS — L84 Corns and callosities: Secondary | ICD-10-CM | POA: Diagnosis not present

## 2012-12-30 DIAGNOSIS — N390 Urinary tract infection, site not specified: Secondary | ICD-10-CM | POA: Diagnosis not present

## 2012-12-30 DIAGNOSIS — I10 Essential (primary) hypertension: Secondary | ICD-10-CM | POA: Diagnosis not present

## 2013-01-07 DIAGNOSIS — I252 Old myocardial infarction: Secondary | ICD-10-CM | POA: Diagnosis not present

## 2013-01-07 DIAGNOSIS — I119 Hypertensive heart disease without heart failure: Secondary | ICD-10-CM | POA: Diagnosis not present

## 2013-01-07 DIAGNOSIS — E109 Type 1 diabetes mellitus without complications: Secondary | ICD-10-CM | POA: Diagnosis not present

## 2013-01-08 ENCOUNTER — Other Ambulatory Visit (HOSPITAL_COMMUNITY): Payer: Self-pay | Admitting: Cardiovascular Disease

## 2013-01-08 DIAGNOSIS — I119 Hypertensive heart disease without heart failure: Secondary | ICD-10-CM

## 2013-01-16 DIAGNOSIS — I10 Essential (primary) hypertension: Secondary | ICD-10-CM | POA: Diagnosis not present

## 2013-01-20 DIAGNOSIS — I2510 Atherosclerotic heart disease of native coronary artery without angina pectoris: Secondary | ICD-10-CM | POA: Diagnosis not present

## 2013-01-20 DIAGNOSIS — I10 Essential (primary) hypertension: Secondary | ICD-10-CM | POA: Diagnosis not present

## 2013-01-20 DIAGNOSIS — N390 Urinary tract infection, site not specified: Secondary | ICD-10-CM | POA: Diagnosis not present

## 2013-02-03 DIAGNOSIS — E119 Type 2 diabetes mellitus without complications: Secondary | ICD-10-CM | POA: Diagnosis not present

## 2013-02-03 DIAGNOSIS — I10 Essential (primary) hypertension: Secondary | ICD-10-CM | POA: Diagnosis not present

## 2013-02-03 DIAGNOSIS — N390 Urinary tract infection, site not specified: Secondary | ICD-10-CM | POA: Diagnosis not present

## 2013-02-05 ENCOUNTER — Ambulatory Visit (HOSPITAL_COMMUNITY)
Admission: RE | Admit: 2013-02-05 | Discharge: 2013-02-05 | Disposition: A | Discharge status: Home / Self Care | Payer: Medicare Other | Source: Ambulatory Visit | Attending: Cardiovascular Disease | Admitting: Cardiovascular Disease

## 2013-02-05 DIAGNOSIS — I369 Nonrheumatic tricuspid valve disorder, unspecified: Secondary | ICD-10-CM | POA: Diagnosis not present

## 2013-02-05 DIAGNOSIS — I119 Hypertensive heart disease without heart failure: Secondary | ICD-10-CM | POA: Diagnosis not present

## 2013-02-05 DIAGNOSIS — I252 Old myocardial infarction: Secondary | ICD-10-CM | POA: Diagnosis not present

## 2013-02-05 DIAGNOSIS — I059 Rheumatic mitral valve disease, unspecified: Secondary | ICD-10-CM | POA: Diagnosis not present

## 2013-02-05 NOTE — Progress Notes (Signed)
[facility]   2D echo completed 02/05/2013.   Cindy Evangelina Delancey, RDCS  

## 2013-02-13 ENCOUNTER — Ambulatory Visit: Payer: Medicare Other | Admitting: Endocrinology

## 2013-02-17 ENCOUNTER — Other Ambulatory Visit: Payer: Self-pay | Admitting: Endocrinology

## 2013-02-21 DIAGNOSIS — Q828 Other specified congenital malformations of skin: Secondary | ICD-10-CM | POA: Diagnosis not present

## 2013-02-21 DIAGNOSIS — B351 Tinea unguium: Secondary | ICD-10-CM | POA: Diagnosis not present

## 2013-02-21 DIAGNOSIS — M79609 Pain in unspecified limb: Secondary | ICD-10-CM | POA: Diagnosis not present

## 2013-03-06 DIAGNOSIS — I119 Hypertensive heart disease without heart failure: Secondary | ICD-10-CM | POA: Diagnosis not present

## 2013-03-06 DIAGNOSIS — I2510 Atherosclerotic heart disease of native coronary artery without angina pectoris: Secondary | ICD-10-CM | POA: Diagnosis not present

## 2013-03-06 DIAGNOSIS — E109 Type 1 diabetes mellitus without complications: Secondary | ICD-10-CM | POA: Diagnosis not present

## 2013-03-06 DIAGNOSIS — N289 Disorder of kidney and ureter, unspecified: Secondary | ICD-10-CM | POA: Diagnosis not present

## 2013-03-13 ENCOUNTER — Encounter: Payer: Self-pay | Admitting: Endocrinology

## 2013-03-13 ENCOUNTER — Ambulatory Visit
Admission: RE | Admit: 2013-03-13 | Discharge: 2013-03-13 | Disposition: A | Discharge status: Home / Self Care | Payer: Medicare Other | Source: Ambulatory Visit | Attending: Endocrinology | Admitting: Endocrinology

## 2013-03-13 ENCOUNTER — Ambulatory Visit (INDEPENDENT_AMBULATORY_CARE_PROVIDER_SITE_OTHER): Payer: Medicare Other | Admitting: Endocrinology

## 2013-03-13 ENCOUNTER — Telehealth: Payer: Self-pay | Admitting: *Deleted

## 2013-03-13 VITALS — BP 134/80 | HR 78 | Wt 155.0 lb

## 2013-03-13 DIAGNOSIS — E1065 Type 1 diabetes mellitus with hyperglycemia: Secondary | ICD-10-CM | POA: Diagnosis not present

## 2013-03-13 DIAGNOSIS — R05 Cough: Secondary | ICD-10-CM | POA: Diagnosis not present

## 2013-03-13 DIAGNOSIS — I10 Essential (primary) hypertension: Secondary | ICD-10-CM | POA: Diagnosis not present

## 2013-03-13 DIAGNOSIS — E1029 Type 1 diabetes mellitus with other diabetic kidney complication: Secondary | ICD-10-CM

## 2013-03-13 LAB — HEMOGLOBIN A1C: Hgb A1c MFr Bld: 10.6 % — ABNORMAL HIGH (ref 4.6–6.5)

## 2013-03-13 MED ORDER — TRAMADOL HCL 50 MG PO TABS: 50.0000 mg | ORAL_TABLET | Freq: Three times a day (TID) | ORAL | Status: DC | PRN

## 2013-03-13 MED ORDER — AZITHROMYCIN 500 MG PO TABS: 500.0000 mg | ORAL_TABLET | Freq: Every day | ORAL | Status: DC

## 2013-03-13 NOTE — Progress Notes (Signed)
Subjective:    Patient ID: Henry [name], male    DOB: Jul 02, 1941, 72 y.o.   MRN: [id number]  HPI Pt returns for f/u of type 1 dm (dx'ed 1961; complicated by renal insufficiency, CAD, and retinopathy; he has declined pump and continuous glucose monitor; last episode of severe hypoglycemia was approx 2008; he has never had DKA).  he brings a record of his cbg's which i have reviewed today.  He denies hypoglycemia.  cbg's vary from 82-200's.  It is ighst at hs and in am, but not necessarily so.  It is lower with activity Pt states 1 week of moderate prod-quality cough in the chest, and assoc nasal congestion.   Past Medical History  Diagnosis Date  . ANEMIA-NOS 07/10/2007  . CORONARY ARTERY DISEASE 07/10/2007  . DIABETES MELLITUS, TYPE I 07/10/2007  . HYPERLIPIDEMIA 07/10/2007  . HYPERTENSION 07/10/2007  . RENAL INSUFFICIENCY 07/10/2007  . 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 Coronary artery bypass graft    . Appendectomy    . Carotid endarterectomy      right    History   Social History  . Marital Status: Married    Spouse Name: N/A    Number of Children: N/A  . Years of Education: N/A   Occupational History  . Not on file.   Social History Main Topics  . Smoking status: Never Smoker   . Smokeless tobacco: Not on file  . Alcohol Use: No  . Drug Use: No  . Sexually Active:    Other Topics Concern  . Not on file   Social History Narrative   Says his diet and exercise are very good    Current Outpatient Prescriptions on File Prior to Visit  Medication Sig Dispense Refill  . aspirin 81 MG tablet Take 81 mg by mouth daily.        . B Complex-C-Folic Acid (FOLBEE PLUS) TABS Take 1 tablet by mouth daily.        . Cholecalciferol (VITAMIN D) 2000 UNITS CAPS Take 1 capsule by mouth daily.       . cloNIDine (CATAPRES) 0.1 MG tablet Take 1/2 tablet by mouth three times a day      . hydrochlorothiazide 25 MG  tablet Take 25 mg by mouth daily.       . insulin glargine (LANTUS) 100 UNIT/ML injection Inject 22 Units into the skin at bedtime.      . insulin lispro (HUMALOG) 100 UNIT/ML injection Inject subcutaneously just before each meal 08-02-11 units. If BS is 200 or more, take 1 extra unit.      . isosorbide mononitrate (IMDUR) 60 MG 24 hr tablet Take 60 mg by mouth daily.        Marland Kitchen labetalol (NORMODYNE) 200 MG tablet Take 200 mg by mouth 2 (two) times daily.      Marland Kitchen LANTUS 100 UNIT/ML injection INJECT 19 UNITS EVERY NIGHT AT BEDTIME AS DIRECTED  10 mL  3  . omeprazole (PRILOSEC) 20 MG capsule Take 20 mg by mouth daily.         No current facility-administered medications on file prior to visit.    Allergies  Allergen Reactions  . Amlodipine Besylate   . Codeine     anxiety  . Lipitor (Atorvastatin Calcium)     myalgias  . Penicillins   . Valsartan     Family History  Problem Relation Age of Onset  . Cancer  Neg Hx     BP 134/80  Pulse 78  Wt 155 lb (70.308 kg)  BMI 22.24 kg/m2  SpO2 98%   Review of Systems Denies fever and sob.      Objective:   Physical Exam VITAL SIGNS:  See vs page.  GENERAL: no distress. head: no deformity. eyes: no periorbital swelling, no proptosis. external nose and ears are normal.  mouth: no lesion seen. Both eac's and tm's are normal. LUNGS:  Clear to auscultation.     Assessment & Plan:  URI, new DM, with improved control

## 2013-03-13 NOTE — Telephone Encounter (Signed)
Called pt and LVM that his blood sugar is high. His HgbA1C was  10.6. Dr Everardo All asks that he please increase humalog to 3x a day just before each meal 09-02-12 units. However, Take 1 extra unit anytime it is over 200. If you are going to be active, subtract 2 units from that injection. Keep a diary of his blood glucose to be sure that he adds or subtracts the correct units. If he has any questions to please give [place] a call.

## 2013-03-13 NOTE — Patient Instructions (Addendum)
A diabetes blood test and x-ray are being requested for you today.  We'll contact you with results.   Please come back for a regular physical appointment in 3 months.   Please continue humalog, 3x a day just before each meal 08-02-11 units.  However, Take 1 extra unit anytime it is over 200.  If you are going to be active, subtract 2 units from that injection. continue lantus, 20 unit at bedtime.   i have sent a prescription to your pharmacy: antibiotic and cough medication. Loratadine-d (non-prescription) will help your congestion.

## 2013-03-17 DIAGNOSIS — I2510 Atherosclerotic heart disease of native coronary artery without angina pectoris: Secondary | ICD-10-CM | POA: Diagnosis not present

## 2013-03-17 DIAGNOSIS — I10 Essential (primary) hypertension: Secondary | ICD-10-CM | POA: Diagnosis not present

## 2013-03-17 DIAGNOSIS — N390 Urinary tract infection, site not specified: Secondary | ICD-10-CM | POA: Diagnosis not present

## 2013-04-10 ENCOUNTER — Other Ambulatory Visit: Payer: Self-pay | Admitting: Endocrinology

## 2013-04-14 ENCOUNTER — Other Ambulatory Visit: Payer: Self-pay | Admitting: *Deleted

## 2013-04-14 MED ORDER — INSULIN LISPRO 100 UNIT/ML ~~LOC~~ SOLN: SUBCUTANEOUS | Status: DC

## 2013-05-12 DIAGNOSIS — Q828 Other specified congenital malformations of skin: Secondary | ICD-10-CM | POA: Diagnosis not present

## 2013-05-12 DIAGNOSIS — B351 Tinea unguium: Secondary | ICD-10-CM | POA: Diagnosis not present

## 2013-05-12 DIAGNOSIS — M79609 Pain in unspecified limb: Secondary | ICD-10-CM | POA: Diagnosis not present

## 2013-05-15 DIAGNOSIS — E1139 Type 2 diabetes mellitus with other diabetic ophthalmic complication: Secondary | ICD-10-CM | POA: Diagnosis not present

## 2013-05-15 DIAGNOSIS — Z961 Presence of intraocular lens: Secondary | ICD-10-CM | POA: Diagnosis not present

## 2013-05-15 DIAGNOSIS — E11311 Type 2 diabetes mellitus with unspecified diabetic retinopathy with macular edema: Secondary | ICD-10-CM | POA: Diagnosis not present

## 2013-05-15 DIAGNOSIS — E11359 Type 2 diabetes mellitus with proliferative diabetic retinopathy without macular edema: Secondary | ICD-10-CM | POA: Diagnosis not present

## 2013-05-24 ENCOUNTER — Other Ambulatory Visit: Payer: Self-pay | Admitting: Endocrinology

## 2013-05-26 ENCOUNTER — Other Ambulatory Visit: Payer: Self-pay | Admitting: *Deleted

## 2013-05-26 MED ORDER — INSULIN GLARGINE 100 UNIT/ML ~~LOC~~ SOLN: 22.0000 [IU] | Freq: Every day | SUBCUTANEOUS | Status: DC

## 2013-06-13 ENCOUNTER — Telehealth: Payer: Self-pay | Admitting: *Deleted

## 2013-06-13 ENCOUNTER — Ambulatory Visit (INDEPENDENT_AMBULATORY_CARE_PROVIDER_SITE_OTHER): Payer: Medicare Other | Admitting: Endocrinology

## 2013-06-13 ENCOUNTER — Encounter: Payer: Self-pay | Admitting: Endocrinology

## 2013-06-13 VITALS — BP 122/70 | HR 78 | Ht 70.0 in | Wt 155.0 lb

## 2013-06-13 DIAGNOSIS — D649 Anemia, unspecified: Secondary | ICD-10-CM

## 2013-06-13 DIAGNOSIS — E1029 Type 1 diabetes mellitus with other diabetic kidney complication: Secondary | ICD-10-CM

## 2013-06-13 DIAGNOSIS — Z125 Encounter for screening for malignant neoplasm of prostate: Secondary | ICD-10-CM | POA: Diagnosis not present

## 2013-06-13 DIAGNOSIS — N259 Disorder resulting from impaired renal tubular function, unspecified: Secondary | ICD-10-CM

## 2013-06-13 DIAGNOSIS — Z79899 Other long term (current) drug therapy: Secondary | ICD-10-CM

## 2013-06-13 DIAGNOSIS — E119 Type 2 diabetes mellitus without complications: Secondary | ICD-10-CM | POA: Diagnosis not present

## 2013-06-13 DIAGNOSIS — E7800 Pure hypercholesterolemia, unspecified: Secondary | ICD-10-CM

## 2013-06-13 DIAGNOSIS — E785 Hyperlipidemia, unspecified: Secondary | ICD-10-CM | POA: Diagnosis not present

## 2013-06-13 DIAGNOSIS — Z0000 Encounter for general adult medical examination without abnormal findings: Secondary | ICD-10-CM | POA: Diagnosis not present

## 2013-06-13 DIAGNOSIS — E1065 Type 1 diabetes mellitus with hyperglycemia: Secondary | ICD-10-CM

## 2013-06-13 DIAGNOSIS — I10 Essential (primary) hypertension: Secondary | ICD-10-CM

## 2013-06-13 LAB — LIPID PANEL: Cholesterol: 166 mg/dL (ref 0–200)

## 2013-06-13 LAB — CBC WITH DIFFERENTIAL/PLATELET
Basophils Absolute: 0 10*3/uL (ref 0.0–0.1)
HCT: 34.9 % — ABNORMAL LOW (ref 39.0–52.0)
Lymphs Abs: 2.1 10*3/uL (ref 0.7–4.0)
MCV: 92.7 fl (ref 78.0–100.0)
Monocytes Absolute: 0.6 10*3/uL (ref 0.1–1.0)
Platelets: 219 10*3/uL (ref 150.0–400.0)
RDW: 13.7 % (ref 11.5–14.6)

## 2013-06-13 LAB — HEMOGLOBIN A1C: Hgb A1c MFr Bld: 10.5 % — ABNORMAL HIGH (ref 4.6–6.5)

## 2013-06-13 LAB — HEPATIC FUNCTION PANEL
ALT: 21 U/L (ref 0–53)
AST: 27 U/L (ref 0–37)
Bilirubin, Direct: 0.1 mg/dL (ref 0.0–0.3)
Total Bilirubin: 0.8 mg/dL (ref 0.3–1.2)

## 2013-06-13 NOTE — Progress Notes (Signed)
Subjective:    Patient ID: Henry [name], male    DOB: [birth year], 72 y.o.   MRN: [id number]  HPI Pt is here for regular wellness examination, and is feeling pretty well in general, and says chronic med probs are stable, except as noted below. Past Medical History  Diagnosis Date  . ANEMIA-NOS 07/10/2007  . CORONARY ARTERY DISEASE 07/10/2007  . DIABETES MELLITUS, TYPE I 07/10/2007  . HYPERLIPIDEMIA 07/10/2007  . HYPERTENSION 07/10/2007  . RENAL INSUFFICIENCY 07/10/2007  . 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 Coronary artery bypass graft    . Appendectomy    . Carotid endarterectomy      right    History   Social History  . Marital Status: Married    Spouse Name: N/A    Number of Children: N/A  . Years of Education: N/A   Occupational History  . Not on file.   Social History Main Topics  . Smoking status: Never Smoker   . Smokeless tobacco: Not on file  . Alcohol Use: No  . Drug Use: No  . Sexually Active:    Other Topics Concern  . Not on file   Social History Narrative   Says his diet and exercise are very good    Current Outpatient Prescriptions on File Prior to Visit  Medication Sig Dispense Refill  . aspirin 81 MG tablet Take 81 mg by mouth daily.        Marland Kitchen azithromycin (ZITHROMAX) 500 MG tablet Take 1 tablet (500 mg total) by mouth daily.  5 tablet  0  . B Complex-C-Folic Acid (FOLBEE PLUS) TABS Take 1 tablet by mouth daily.        . Cholecalciferol (VITAMIN D) 2000 UNITS CAPS Take 1 capsule by mouth daily.       . cloNIDine (CATAPRES) 0.1 MG tablet Take 1/2 tablet by mouth three times a day      . hydrochlorothiazide 25 MG tablet Take 25 mg by mouth daily.       . insulin lispro (HUMALOG) 100 UNIT/ML injection Inject into the skin 3 (three) times daily before meals. 3 times a day (just before each meal) 08-03-13 units      . isosorbide mononitrate (IMDUR) 60 MG 24 hr tablet Take 60 mg by  mouth daily.        Marland Kitchen labetalol (NORMODYNE) 200 MG tablet Take 200 mg by mouth 2 (two) times daily.      Marland Kitchen LANTUS 100 UNIT/ML injection INJECT 19 UNITS UNDER THE SKIN EVERY NIGHT AT BEDTIME AS DIRECTED  10 mL  0  . omeprazole (PRILOSEC) 20 MG capsule Take 20 mg by mouth daily.        . traMADol (ULTRAM) 50 MG tablet Take 1 tablet (50 mg total) by mouth every 8 (eight) hours as needed (cough).  30 tablet  0   No current facility-administered medications on file prior to visit.    Allergies  Allergen Reactions  . Amlodipine Besylate   . Codeine     anxiety  . Lipitor (Atorvastatin Calcium)     myalgias  . Penicillins   . Valsartan     Family History  Problem Relation Age of Onset  . Cancer Neg Hx     BP 122/70  Pulse 78  Ht 5\' 10"  (1.778 m)  Wt 155 lb (70.308 kg)  BMI 22.24 kg/m2  SpO2 98%  Review of Systems  Constitutional: Negative for  fever.  HENT: Negative for hearing loss.   Eyes: Negative for visual disturbance.  Respiratory: Negative for shortness of breath.   Cardiovascular: Negative for chest pain.  Gastrointestinal: Negative for anal bleeding.  Endocrine: Negative for cold intolerance.  Genitourinary: Negative for hematuria and difficulty urinating.  Musculoskeletal: Negative for back pain.  Skin: Negative for rash.  Allergic/Immunologic: Negative for environmental allergies.  Neurological: Negative for numbness.  Hematological: Does not bruise/bleed easily.  Psychiatric/Behavioral: Negative for dysphoric mood.   denies hypoglycemia and weight change.     Objective:   Physical Exam VS: see vs page GEN: no distress HEAD: head: no deformity eyes: no periorbital swelling, no proptosis external nose and ears are normal mouth: no lesion seen NECK: supple, thyroid is not enlarged.  Old healed surgical scar (right CEA) CHEST WALL: no deformity LUNGS: clear to auscultation BREASTS:  No gynecomastia CV: reg rate and rhythm, no murmur ABD: abdomen is soft,  nontender.  no hepatosplenomegaly.  not distended.  no hernia RECTAL: normal external and internal exam.  heme neg. PROSTATE:  Normal size.  No nodule MUSCULOSKELETAL: muscle bulk and strength are grossly normal.  no obvious joint swelling.  gait is normal and steady. PULSES: no carotid bruit NEURO:  cn 2-12 grossly intact.   readily moves all 4's.   SKIN:  Normal texture and temperature.  No rash or suspicious lesion is visible.   NODES:  None palpable at the neck PSYCH: alert, oriented x3.  Does not appear anxious nor depressed.        Assessment & Plan:  He refuses colonoscopy.  i advised him of risk.  we discussed code status.  pt requests full code, but would not want to be started or maintained on artificial life-support measures if there was not a reasonable chance of recovery.       SEPARATE EVALUATION FOLLOWS--EACH PROBLEM HERE IS NEW, NOT RESPONDING TO TREATMENT, OR POSES SIGNIFICANT RISK TO THE PATIENT'S HEALTH: HISTORY OF THE PRESENT ILLNESS: Pt returns for f/u of type 1 dm (dx'ed 1961; He has mild if any neuropathy of the lower extremities; he has associated complications of renal insufficiency, CAD, and retinopathy; he has declined pump and continuous glucose monitor; last episode of severe hypoglycemia was approx 2008; he has never had DKA).  he brings a record of his cbg's which i have reviewed today.  pt states he feels well in general.  cbg's vary from 83-200's.  It is lowest at lunch, and higher at all other times of day.  PAST MEDICAL HISTORY reviewed and up to date today REVIEW OF SYSTEMS: denies recent hypoglycemia and weight change PHYSICAL EXAMINATION: VITAL SIGNS:  See vs page GENERAL: no distress LAB/XRAY RESULTS: Lab Results  Component Value Date   WBC 6.2 06/13/2013   HGB 11.6* 06/13/2013   HCT 34.9* 06/13/2013   PLT 219.0 06/13/2013   GLUCOSE 108 04/06/2010   CHOL 166 06/13/2013   TRIG 87.0 06/13/2013   HDL 44.60 06/13/2013   LDLCALC 104* 06/13/2013   ALT  21 06/13/2013   AST 27 06/13/2013   NA 128* 03/18/2010   K 3.7 03/18/2010   CL 98 03/18/2010   CREATININE 1.13 04/06/2010   BUN 29 04/06/2010   CO2 20 04/06/2010   TSH 2.53 06/13/2013   PSA 1.02 06/13/2013   INR 1.16 02/28/2010   HGBA1C 10.5* 06/13/2013   MICROALBUR 6.6* 05/17/2012  IMPRESSION: DM: he needs increased rx.  This insulin regimen was chosen from multiple options, as it best  matches his insulin to his changing requirements throughout the day.  The benefits of glycemic control must be weighed against the risks of hypoglycemia.   Anemia, persistent Dyslipidemia, therapy is limited by multiple perceived drug intolerances PLAN: See instruction page

## 2013-06-13 NOTE — Patient Instructions (Addendum)
blood tests are being requested for you today.  We'll contact you with results. Please come back for a follow-up appointment in 3 months   Please increase humalog to 3x a day just before each meal 09-03-13 units.  However, Take 1 extra unit anytime it is over 200.  If you are going to be active, subtract 2 units from that injection. continue lantus, 21 units at bedtime.   please consider these measures for your health:  minimize alcohol.  do not use tobacco products.  have a colonoscopy at least every 10 years from age 88.  keep firearms safely stored.  always use seat belts.  have working smoke alarms in your home.  see an eye doctor and dentist regularly.  never drive under the influence of alcohol or drugs (including prescription drugs).  those with fair skin should take precautions against the sun. please let me know what your wishes would be, if artificial life support measures should become necessary.  it is critically important to prevent falling down (keep floor areas well-lit, dry, and free of loose objects.  If you have a cane, walker, or wheelchair, you should use it, even for short trips around the house.  Also, try not to rush).

## 2013-06-13 NOTE — Telephone Encounter (Signed)
Called pt and told him that his HgbA1C was 10.5, still high. He is advising you to please adjust insulin as he discussed with you. He will see you at your next follow up appt.

## 2013-06-16 DIAGNOSIS — I10 Essential (primary) hypertension: Secondary | ICD-10-CM | POA: Diagnosis not present

## 2013-06-18 DIAGNOSIS — I2510 Atherosclerotic heart disease of native coronary artery without angina pectoris: Secondary | ICD-10-CM | POA: Diagnosis not present

## 2013-06-18 DIAGNOSIS — I10 Essential (primary) hypertension: Secondary | ICD-10-CM | POA: Diagnosis not present

## 2013-06-18 DIAGNOSIS — N390 Urinary tract infection, site not specified: Secondary | ICD-10-CM | POA: Diagnosis not present

## 2013-07-10 ENCOUNTER — Ambulatory Visit (INDEPENDENT_AMBULATORY_CARE_PROVIDER_SITE_OTHER): Payer: Medicare Other | Admitting: Cardiovascular Disease

## 2013-07-10 ENCOUNTER — Encounter: Payer: Self-pay | Admitting: Cardiovascular Disease

## 2013-07-10 VITALS — BP 122/56 | HR 64 | Ht 70.0 in | Wt 152.4 lb

## 2013-07-10 DIAGNOSIS — R42 Dizziness and giddiness: Secondary | ICD-10-CM | POA: Insufficient documentation

## 2013-07-10 DIAGNOSIS — E1029 Type 1 diabetes mellitus with other diabetic kidney complication: Secondary | ICD-10-CM | POA: Diagnosis not present

## 2013-07-10 DIAGNOSIS — N259 Disorder resulting from impaired renal tubular function, unspecified: Secondary | ICD-10-CM

## 2013-07-10 DIAGNOSIS — E1065 Type 1 diabetes mellitus with hyperglycemia: Secondary | ICD-10-CM

## 2013-07-10 DIAGNOSIS — I10 Essential (primary) hypertension: Secondary | ICD-10-CM | POA: Diagnosis not present

## 2013-07-10 DIAGNOSIS — I2510 Atherosclerotic heart disease of native coronary artery without angina pectoris: Secondary | ICD-10-CM

## 2013-07-10 DIAGNOSIS — E119 Type 2 diabetes mellitus without complications: Secondary | ICD-10-CM

## 2013-07-10 NOTE — Progress Notes (Signed)
Patient ID: Henry [name], male   DOB: [birth year], 72 y.o.   MRN: [id number]     HPI: Henry [name], is a 72 y.o. male who presents to the office today for four-month cardiology evaluation.  Henry [name] is a long-standing history of type I brittle diabetes for over 50 years. He has established coronary artery disease in April 1999 submitted are wall myocardial infarction which initially was constipated by third-degree heart block in the setting of his MI. He underwent PTCA of his right artery artery. Due to the extensive, and CAD he ultimately underwent elective CABG surgery. He also has peripheral vascular disease and is status post right carotid endarterectomy. At catheterization March 2011 he was found to have 95% stenosis in the graft supplying his right eye artery and underwent successful stenting with a 3.0x26 mm integrity stent. Her mouth stent was used in light of his significant diabetic retinopathy with significant bleeding history to reduce his need for long-term dual antiplatelet therapy. Henry [name] has renal insufficiency and is followed by Dr. Woody Seller apparently, he seems to be tolerating a 3 times a day regimen of labetalol better than a twice a day regimen. Laboratory in 2013 showed a creatinine of 1.32.   Henry [name] has experienced several episodes of dizziness. He felt possibly to have an autonomic neuropathy. He denies frank syncope. Denies recurrent episodes of chest pain. He denies significant shortness of breath.  Past Medical History  Diagnosis Date  . ANEMIA-NOS 07/10/2007  . CORONARY ARTERY DISEASE 07/10/2007  . DIABETES MELLITUS, TYPE I 07/10/2007  . HYPERLIPIDEMIA 07/10/2007  . HYPERTENSION 07/10/2007  . RENAL INSUFFICIENCY 07/10/2007  . 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 Coronary artery bypass graft    . Appendectomy    . Carotid endarterectomy      right    Allergies  Allergen Reactions  .  Amlodipine Besylate   . Codeine     anxiety  . Lipitor (Atorvastatin Calcium)     myalgias  . Penicillins   . Valsartan     Current Outpatient Prescriptions  Medication Sig Dispense Refill  . aspirin 81 MG tablet Take 81 mg by mouth daily.        . B Complex-C-Folic Acid (FOLBEE PLUS) TABS Take 1 tablet by mouth daily.        . Cholecalciferol (VITAMIN D) 2000 UNITS CAPS Take 1 capsule by mouth daily.       . hydrochlorothiazide 25 MG tablet Take 12.5 mg by mouth daily.       . insulin glargine (LANTUS) 100 UNIT/ML injection Inject 21 Units into the skin at bedtime.      . insulin lispro (HUMALOG) 100 UNIT/ML injection Inject into the skin 3 (three) times daily before meals. 3 times a day (just before each meal) 08-03-13 units      . isosorbide mononitrate (IMDUR) 60 MG 24 hr tablet Take 60 mg by mouth daily.        Marland Kitchen labetalol (NORMODYNE) 200 MG tablet Take 200 mg by mouth 3 (three) times daily.       Marland Kitchen omeprazole (PRILOSEC) 20 MG capsule Take 20 mg by mouth daily.         No current facility-administered medications for this visit.    Socially he is married has 2 children and 2 grandchildren. No tobacco history or alcohol use. He is not routinely exercise.  ROS is negative for fevers, chills or  night sweats. He denies rash. He denies visual symptoms. He denies wheezing. He does note some mild dizziness if he stands abruptly. He denies frank syncope. He denies angina. He denies bleeding. He denies paresthesias. Denies claudication.  Other system review is negative.  PE BP 122/56  Pulse 64  Ht 5\' 10"  (1.778 m)  Wt 152 lb 6.4 oz (69.128 kg)  BMI 21.87 kg/m2   Repeat BP by me 106/70 supine and 116/74 standing. General: Alert, oriented, no distress.  Skin: normal turgor, no rashes HEENT: Normocephalic, atraumatic. Pupils round and reactive; sclera anicteric;no lid lag.  Nose without nasal septal hypertrophy Mouth/Parynx benign; Mallinpatti scale 2 Neck: No JVD; he is status post  right carotid endarterectomy. He does have  bilateral bruits left greater than right. Lungs: clear to ausculatation and percussion; no wheezing or rales Heart: RRR, s1 s2 normal 2/6 systolic murmur with a diastolic murmur. Abdomen: soft, nontender; no hepatosplenomehaly, BS+; abdominal aorta nontender and not dilated by palpation. No renal artery bruits. Pulses 2+ Extremities: no clubbing cyanosis or edema, Homan's sign negative  Neurologic: grossly nonfocal  ECG: Sinus rhythm at 64 beats per minute. QTc interval 400 ms. One isolated PAC. Small nondiagnostic Q waves inferiorly with preserved R waves.  LABS:  BMET    Component Value Date/Time   NA 128* 03/18/2010 0328   K 3.7 03/18/2010 0328   CL 98 03/18/2010 0328   CO2 20 04/06/2010   GLUCOSE 108 04/06/2010   BUN 29 04/06/2010   CREATININE 1.13 04/06/2010   CALCIUM 9.5 06/13/2013 0840   GFRNONAA >60 04/06/2010   GFRAA  Value: 59        The eGFR has been calculated using the MDRD equation. This calculation has not been validated in all clinical situations. eGFR's persistently <60 mL/min signify possible Chronic Kidney Disease.* 03/18/2010 0328     Hepatic Function Panel     Component Value Date/Time   PROT 6.8 06/13/2013 0840   ALBUMIN 3.8 06/13/2013 0840   AST 27 06/13/2013 0840   ALT 21 06/13/2013 0840   ALKPHOS 68 06/13/2013 0840   BILITOT 0.8 06/13/2013 0840   BILIDIR 0.1 06/13/2013 0840     CBC    Component Value Date/Time   WBC 6.2 06/13/2013 0840   RBC 3.77* 06/13/2013 0840   HGB 11.6* 06/13/2013 0840   HCT 34.9* 06/13/2013 0840   PLT 219.0 06/13/2013 0840   MCV 92.7 06/13/2013 0840   MCHC 33.2 06/13/2013 0840   RDW 13.7 06/13/2013 0840   LYMPHSABS 2.1 06/13/2013 0840   MONOABS 0.6 06/13/2013 0840   EOSABS 0.2 06/13/2013 0840   BASOSABS 0.0 06/13/2013 0840     BNP No results found for this basename: probnp    Lipid Panel     Component Value Date/Time   CHOL 166 06/13/2013 0840   TRIG 87.0 06/13/2013 0840   HDL 44.60  06/13/2013 0840   CHOLHDL 4 06/13/2013 0840   VLDL 17.4 06/13/2013 0840   LDLCALC 104* 06/13/2013 0840     RADIOLOGY: No results found.    ASSESSMENT AND PLAN: From a cardiac standpoint, Henry [name] continues to be fairly stable. He is now 15 years following his inferior wall myocardial infarction which was treated successfully with PTCA of his right coronary artery with resultant significant myocardial salvage. Due to concomitant CAD he underwent CABG surgery with a LIMA to his LAD, vein to diagonal, sequential vein to OM1 and distal circumflex, and SVG to his right coronary artery. He status  post stenting of the proximal portion of the vein graft to his right coronary artery in 2011. His last nuclear perfusion study was in November 2012 which remained low risk and did show minimal region of inferior scar. His last carotid studies were June 2013 which showed mild plaque without significant stenoses. Presently, he does not have any orthostatic hypotension. He states he does no more dizziness in the extreme heat. Only a rare PAC was noted on ECG. He seems to be tolerating his current medications and  is not having any shortness of breath or anginal symptomatology. He is not on current lipid-lowering therapy and ideally should be on this with a target LDL of less than 70 or less unless he has significant contraindications or intolerance. Apparently, he did develop myalgias in the past on Lipitor. She may be a tolerate low dose livalo with concomitant coenzyme Q10 now defer to Dr. Everardo All potential initiation of this treatment. I will see him back in 6 months for followup cardiology evaluation.    Lennette Bihari, MD, [facility]  07/10/2013 9:36 AM

## 2013-07-10 NOTE — Patient Instructions (Signed)
Your physician recommends that you schedule a follow-up appointment in: 6 MONTHS. No changes has been made in your therapy today. 

## 2013-07-14 DIAGNOSIS — B351 Tinea unguium: Secondary | ICD-10-CM | POA: Diagnosis not present

## 2013-07-14 DIAGNOSIS — M79609 Pain in unspecified limb: Secondary | ICD-10-CM | POA: Diagnosis not present

## 2013-07-14 DIAGNOSIS — L84 Corns and callosities: Secondary | ICD-10-CM | POA: Diagnosis not present

## 2013-07-16 ENCOUNTER — Ambulatory Visit (INDEPENDENT_AMBULATORY_CARE_PROVIDER_SITE_OTHER): Payer: Medicare Other | Admitting: Emergency Medicine

## 2013-07-16 VITALS — BP 120/58 | HR 68 | Temp 98.0°F | Resp 16 | Ht 69.0 in | Wt 149.0 lb

## 2013-07-16 DIAGNOSIS — T148 Other injury of unspecified body region: Secondary | ICD-10-CM | POA: Diagnosis not present

## 2013-07-16 DIAGNOSIS — W57XXXA Bitten or stung by nonvenomous insect and other nonvenomous arthropods, initial encounter: Secondary | ICD-10-CM | POA: Diagnosis not present

## 2013-07-16 NOTE — Progress Notes (Signed)
[facility] [address], [place] [address] [phone]  Date:  07/16/2013   Name:  Henry [name].   DOB:  [birth year]   MRN:  [id number]  PCP:  Romero Belling, MD    Chief Complaint: Insect Bite   History of Present Illness:  Henry [name]. is a 72 y.o. very pleasant male patient who presents with the following:  Working in the yard and felt a sting on his right hand and noted that he has an enlarging erythematous area on his finger at the PIP joint.  No pain or tenderness. No swelling.  No improvement with over the counter medications or other home remedies. Denies other complaint or health concern today.   Current TD   Patient Active Problem List   Diagnosis Date Noted  . Dizziness 07/10/2013  . Diabetes 06/13/2013  . Type I (juvenile type) diabetes mellitus with renal manifestations, uncontrolled 02/16/2012  . Screening for prostate cancer 05/12/2011  . Pure hypercholesterolemia 05/12/2011  . Encounter for long-term (current) use of other medications 05/12/2011  . 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 PROLIFERATIVE DIABETIC RETINOPATHY 05/05/2010  . HYPERLIPIDEMIA 07/10/2007  . ANEMIA-NOS 07/10/2007  . HYPERTENSION 07/10/2007  . CORONARY ARTERY DISEASE 07/10/2007  . RENAL INSUFFICIENCY 07/10/2007    Past Medical History  Diagnosis Date  . ANEMIA-NOS 07/10/2007  . CORONARY ARTERY DISEASE 07/10/2007  . DIABETES MELLITUS, TYPE I 07/10/2007  . HYPERLIPIDEMIA 07/10/2007  . HYPERTENSION 07/10/2007  . RENAL INSUFFICIENCY 07/10/2007  . 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 Coronary artery bypass graft    . Appendectomy    . Carotid endarterectomy      right    History  Substance Use Topics  . Smoking status: Never Smoker   . Smokeless tobacco: Not on file  . Alcohol Use: No    Family History  Problem Relation Age of Onset  . Cancer Neg Hx     Allergies   Allergen Reactions  . Amlodipine Besylate   . Codeine     anxiety  . Lipitor (Atorvastatin Calcium)     myalgias  . Penicillins   . Valsartan     Medication list has been reviewed and updated.  Current Outpatient Prescriptions on File Prior to Visit  Medication Sig Dispense Refill  . aspirin 81 MG tablet Take 81 mg by mouth daily.        . B Complex-C-Folic Acid (FOLBEE PLUS) TABS Take 1 tablet by mouth daily.        . Cholecalciferol (VITAMIN D) 2000 UNITS CAPS Take 1 capsule by mouth daily.       . hydrochlorothiazide 25 MG tablet Take 12.5 mg by mouth daily.       . insulin glargine (LANTUS) 100 UNIT/ML injection Inject 21 Units into the skin at bedtime.      . insulin lispro (HUMALOG) 100 UNIT/ML injection Inject into the skin 3 (three) times daily before meals. 3 times a day (just before each meal) 08-03-13 units      . isosorbide mononitrate (IMDUR) 60 MG 24 hr tablet Take 60 mg by mouth daily.        Marland Kitchen labetalol (NORMODYNE) 200 MG tablet Take 200 mg by mouth 3 (three) times daily.       Marland Kitchen omeprazole (PRILOSEC) 20 MG capsule Take 20 mg by mouth daily.         No current  facility-administered medications on file prior to visit.    Review of Systems:  As per HPI, otherwise negative.    Physical Examination: Filed Vitals:   07/16/13 2004  BP: 120/58  Pulse: 68  Temp: 98 F (36.7 C)  Resp: 16   Filed Vitals:   07/16/13 2004  Height: 5\' 9"  (1.753 m)  Weight: 149 lb (67.586 kg)   Body mass index is 21.99 kg/(m^2). Ideal Body Weight: Weight in (lb) to have BMI = 25: 168.9   GEN: WDWN, NAD, Non-toxic, Alert & Oriented x 3 HEENT: Atraumatic, Normocephalic.  Ears and Nose: No external deformity. EXTR: No clubbing/cyanosis/edema NEURO: Normal gait.  PSYCH: Normally interactive. Conversant. Not depressed or anxious appearing.  Calm demeanor.  RIGHT hand:  PIP fourth finger dorsally has an erythematous area.  NATI.  No FB  Assessment and Plan: Insect  bite Discussed options and lack of treatment for the patient-feared brown recluse.   Will follow up if not improved.   Signed,  Phillips Odor, MD

## 2013-07-16 NOTE — Patient Instructions (Signed)
Insect Bite  Mosquitoes, flies, fleas, bedbugs, and many other insects can bite. Insect bites are different from insect stings. A sting is when venom is injected into the skin. Some insect bites can transmit infectious diseases.  SYMPTOMS   Insect bites usually turn red, swell, and itch for 2 to 4 days. They often go away on their own.  TREATMENT   Your caregiver may prescribe antibiotic medicines if a bacterial infection develops in the bite.  HOME CARE INSTRUCTIONS   Do not scratch the bite area.   Keep the bite area clean and dry. Wash the bite area thoroughly with soap and water.   Put ice or cool compresses on the bite area.   Put ice in a plastic bag.   Place a towel between your skin and the bag.   Leave the ice on for 20 minutes, 4 times a day for the first 2 to 3 days, or as directed.   You may apply a baking soda paste, cortisone cream, or calamine lotion to the bite area as directed by your caregiver. This can help reduce itching and swelling.   Only take over-the-counter or prescription medicines as directed by your caregiver.   If you are given antibiotics, take them as directed. Finish them even if you start to feel better.  You may need a tetanus shot if:   You cannot remember when you had your last tetanus shot.   You have never had a tetanus shot.   The injury broke your skin.  If you get a tetanus shot, your arm may swell, get red, and feel warm to the touch. This is common and not a problem. If you need a tetanus shot and you choose not to have one, there is a rare chance of getting tetanus. Sickness from tetanus can be serious.  SEEK IMMEDIATE MEDICAL CARE IF:    You have increased pain, redness, or swelling in the bite area.   You see a red line on the skin coming from the bite.   You have a fever.   You have joint pain.   You have a headache or neck pain.   You have unusual weakness.   You have a rash.   You have chest pain or shortness of breath.    You have abdominal pain, nausea, or vomiting.   You feel unusually tired or sleepy.  MAKE SURE YOU:    Understand these instructions.   Will watch your condition.   Will get help right away if you are not doing well or get worse.  Document Released: 01/18/2005 Document Revised: 03/04/2012 Document Reviewed: 07/12/2011  [employer] Patient Information 2014 [employer]

## 2013-07-30 DIAGNOSIS — N390 Urinary tract infection, site not specified: Secondary | ICD-10-CM | POA: Diagnosis not present

## 2013-07-30 DIAGNOSIS — I10 Essential (primary) hypertension: Secondary | ICD-10-CM | POA: Diagnosis not present

## 2013-09-15 ENCOUNTER — Encounter: Payer: Self-pay | Admitting: Endocrinology

## 2013-09-15 ENCOUNTER — Ambulatory Visit (INDEPENDENT_AMBULATORY_CARE_PROVIDER_SITE_OTHER): Payer: Medicare Other | Admitting: Endocrinology

## 2013-09-15 ENCOUNTER — Ambulatory Visit: Payer: Medicare Other | Admitting: Endocrinology

## 2013-09-15 VITALS — BP 130/80 | HR 67 | Ht 70.0 in | Wt 152.0 lb

## 2013-09-15 DIAGNOSIS — E1029 Type 1 diabetes mellitus with other diabetic kidney complication: Secondary | ICD-10-CM | POA: Diagnosis not present

## 2013-09-15 DIAGNOSIS — L84 Corns and callosities: Secondary | ICD-10-CM | POA: Diagnosis not present

## 2013-09-15 DIAGNOSIS — B351 Tinea unguium: Secondary | ICD-10-CM | POA: Diagnosis not present

## 2013-09-15 NOTE — Progress Notes (Signed)
Subjective:    Patient ID: Henry Less., male    DOB: 10/01/1941, 72 y.o.   MRN: [id number]  HPI Pt returns for f/u of type 1 dm (dx'ed 1961; he has mild if any neuropathy of the lower extremities; he has associated complications of renal insufficiency, CAD, and retinopathy; he has declined pump and continuous glucose monitor; last episode of severe hypoglycemia was approx 2008; he has never had DKA).  He takes humalog only 3 times a day (just before each meal) 08-01-10 units.  he brings a record of his cbg's which i have reviewed today.  It varies from 70-300, but most are in the 100's.  It is in general lowest before lunch, and highest at hs, but not necessarily so.  pt states he feels well in general. Past Medical History  Diagnosis Date  . ANEMIA-NOS 07/10/2007  . CORONARY ARTERY DISEASE 07/10/2007  . DIABETES MELLITUS, TYPE I 07/10/2007  . HYPERLIPIDEMIA 07/10/2007  . HYPERTENSION 07/10/2007  . RENAL INSUFFICIENCY 07/10/2007  . 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 Coronary artery bypass graft    . Appendectomy    . Carotid endarterectomy      right    History   Social History  . Marital Status: Married    Spouse Name: N/A    Number of Children: N/A  . Years of Education: N/A   Occupational History  . Not on file.   Social History Main Topics  . Smoking status: Never Smoker   . Smokeless tobacco: Not on file  . Alcohol Use: No  . Drug Use: No  . Sexual Activity: Not on file   Other Topics Concern  . Not on file   Social History Narrative   Says his diet and exercise are very good    Current Outpatient Prescriptions on File Prior to Visit  Medication Sig Dispense Refill  . aspirin 81 MG tablet Take 81 mg by mouth daily.        . B Complex-C-Folic Acid (FOLBEE PLUS) TABS Take 1 tablet by mouth daily.        . Cholecalciferol (VITAMIN D) 2000 UNITS CAPS Take 1 capsule by mouth daily.       .  hydrochlorothiazide 25 MG tablet Take 12.5 mg by mouth daily.       . insulin glargine (LANTUS) 100 UNIT/ML injection Inject 21 Units into the skin at bedtime.      . insulin lispro (HUMALOG) 100 UNIT/ML injection Inject into the skin 3 (three) times daily before meals. 3 times a day (just before each meal) 07-13-11 units      . isosorbide mononitrate (IMDUR) 60 MG 24 hr tablet Take 60 mg by mouth daily.        Marland Kitchen labetalol (NORMODYNE) 200 MG tablet Take 200 mg by mouth 3 (three) times daily.       Marland Kitchen omeprazole (PRILOSEC) 20 MG capsule Take 20 mg by mouth daily.         No current facility-administered medications on file prior to visit.    Allergies  Allergen Reactions  . Amlodipine Besylate   . Codeine     anxiety  . Lipitor [Atorvastatin Calcium]     myalgias  . Penicillins   . Valsartan    Family History  Problem Relation Age of Onset  . Cancer Neg Hx    BP 130/80  Pulse 67  Ht 5\' 10"  (1.778 m)  Wt 152 lb (68.947 kg)  BMI 21.81 kg/m2  SpO2 98%  Review of Systems Denies LOC and weight change.      Objective:   Physical Exam VITAL SIGNS:  See vs page GENERAL: no distress  Lab Results  Component Value Date   HGBA1C 10.9* 09/15/2013      Assessment & Plan:  DM: he needs increased rx.  This insulin regimen was chosen from multiple options, as it best matches his insulin to his changing requirements throughout the day.  The benefits of glycemic control must be weighed against the risks of hypoglycemia.   CAD: in this setting, he should avoid hypoglycemia.   Renal insufficiency: this increases the duration of action of DM.

## 2013-09-15 NOTE — Patient Instructions (Addendum)
blood tests are being requested for you today.  We'll contact you with results. Please come back for a follow-up appointment in 4 months.   Please increase humalog to 3x a day just before each meal 07-03-11 units.  However, Take 1 extra unit anytime it is over 200.  If you are going to be active, subtract 2 units from that injection. If you are not able to anticipate the activity, eat a light snack.   continue lantus, 21 units at bedtime.   

## 2013-09-25 DIAGNOSIS — Z0279 Encounter for issue of other medical certificate: Secondary | ICD-10-CM

## 2013-11-17 ENCOUNTER — Ambulatory Visit (INDEPENDENT_AMBULATORY_CARE_PROVIDER_SITE_OTHER): Payer: Medicare Other | Admitting: Podiatry

## 2013-11-17 ENCOUNTER — Encounter: Payer: Self-pay | Admitting: Podiatry

## 2013-11-17 VITALS — BP 149/54 | HR 69 | Resp 20 | Ht 70.0 in | Wt 152.0 lb

## 2013-11-17 DIAGNOSIS — B351 Tinea unguium: Secondary | ICD-10-CM | POA: Diagnosis not present

## 2013-11-17 DIAGNOSIS — M79609 Pain in unspecified limb: Secondary | ICD-10-CM | POA: Diagnosis not present

## 2013-11-17 NOTE — Progress Notes (Signed)
Patient ID: Henry Benko., male   DOB: 06-12-1941, 72 y.o.   MRN: [id number] Subjective: This diabetic patient presents for ongoing debridement of symptomatic mycotic toenails an approximately 2 month intervals. He is complaining of some tenderness in the medial margin of the right hallux nail.  Objective: Incurvated deformed toenails with texture and color changes x10. The medial margin of the first right toenail has a callus nail groove.  Assessment: Symptomatic onychomycoses x10. Callus nail groove medial margin of the right hallux nail.  Plan: All 10 toenails are debrided back without any bleeding. The callus nail groove of the right hallux was debrided back without a bleeding. I packed this margin with antibiotic ointment to soften the margin and will recommend the patient continue to do so on a daily basis until the areas comfortable. Reappoint x61 days.

## 2013-11-17 NOTE — Patient Instructions (Signed)
Apply Vaseline to the edge of the left great toenail daily until the skin softens, and cover with Band-Aid.

## 2013-11-27 DIAGNOSIS — E11311 Type 2 diabetes mellitus with unspecified diabetic retinopathy with macular edema: Secondary | ICD-10-CM | POA: Diagnosis not present

## 2013-11-27 DIAGNOSIS — E1139 Type 2 diabetes mellitus with other diabetic ophthalmic complication: Secondary | ICD-10-CM | POA: Diagnosis not present

## 2013-11-27 DIAGNOSIS — Z961 Presence of intraocular lens: Secondary | ICD-10-CM | POA: Diagnosis not present

## 2013-11-27 DIAGNOSIS — E11359 Type 2 diabetes mellitus with proliferative diabetic retinopathy without macular edema: Secondary | ICD-10-CM | POA: Diagnosis not present

## 2013-12-11 ENCOUNTER — Telehealth: Payer: Self-pay

## 2013-12-11 MED ORDER — INSULIN GLARGINE 100 UNIT/ML ~~LOC~~ SOLN: 21.0000 [IU] | Freq: Every day | SUBCUTANEOUS | Status: DC

## 2013-12-11 MED ORDER — INSULIN LISPRO 100 UNIT/ML ~~LOC~~ SOLN: SUBCUTANEOUS | Status: DC

## 2013-12-11 NOTE — Telephone Encounter (Signed)
ok 

## 2013-12-11 NOTE — Telephone Encounter (Signed)
Patient called stating that he needed a hard copy script for humalog and Lantus. Patient states that he has enough right now but he may need some at the first of the year.  Patient wants the hard copies for his personal records.  Ok to do so?  Thanks!

## 2013-12-11 NOTE — Telephone Encounter (Signed)
Printed

## 2013-12-12 NOTE — Telephone Encounter (Signed)
Per patient request scripts were mailed.

## 2013-12-16 DIAGNOSIS — I10 Essential (primary) hypertension: Secondary | ICD-10-CM | POA: Diagnosis not present

## 2013-12-22 DIAGNOSIS — I10 Essential (primary) hypertension: Secondary | ICD-10-CM | POA: Diagnosis not present

## 2013-12-22 DIAGNOSIS — N390 Urinary tract infection, site not specified: Secondary | ICD-10-CM | POA: Diagnosis not present

## 2014-01-02 ENCOUNTER — Other Ambulatory Visit: Payer: Self-pay | Admitting: Cardiovascular Disease

## 2014-01-02 NOTE — Telephone Encounter (Signed)
Rx was sent to pharmacy electronically. 

## 2014-01-15 ENCOUNTER — Ambulatory Visit: Payer: Medicare Other | Admitting: Endocrinology

## 2014-01-21 ENCOUNTER — Other Ambulatory Visit: Payer: Self-pay | Admitting: Cardiovascular Disease

## 2014-01-21 NOTE — Telephone Encounter (Signed)
Rx was sent to pharmacy electronically. 

## 2014-01-26 ENCOUNTER — Encounter: Payer: Self-pay | Admitting: Endocrinology

## 2014-01-26 ENCOUNTER — Ambulatory Visit (INDEPENDENT_AMBULATORY_CARE_PROVIDER_SITE_OTHER): Payer: Medicare Other | Admitting: Endocrinology

## 2014-01-26 VITALS — BP 128/64 | HR 67 | Temp 98.7°F | Ht 70.0 in | Wt 156.0 lb

## 2014-01-26 DIAGNOSIS — E1029 Type 1 diabetes mellitus with other diabetic kidney complication: Secondary | ICD-10-CM

## 2014-01-26 DIAGNOSIS — E1065 Type 1 diabetes mellitus with hyperglycemia: Secondary | ICD-10-CM | POA: Diagnosis not present

## 2014-01-26 LAB — HEMOGLOBIN A1C: Hgb A1c MFr Bld: 11.4 % — ABNORMAL HIGH (ref 4.6–6.5)

## 2014-01-26 NOTE — Progress Notes (Signed)
Subjective:    Patient ID: Henry LessGlenn T Bang Jr., male    DOB: Jun 03, 1941, 73 y.o.   MRN: [id number]  HPI Pt returns for f/u of type 1 dm (dx'ed 1961; he has mild if any neuropathy of the lower extremities; he has associated complications of renal insufficiency, CAD, and retinopathy; he has declined pump and continuous glucose monitor; last episode of severe hypoglycemia was approx 2008; he has never had DKA; he takes multiple daily injections).  he brings a record of his cbg's which i have reviewed today.  It varies from 75-308, but most are in the 100's.  There is no trend throughout the day.   Past Medical History  Diagnosis Date  . ANEMIA-NOS 07/10/2007  . CORONARY ARTERY DISEASE 07/10/2007  . DIABETES MELLITUS, TYPE I 07/10/2007  . HYPERLIPIDEMIA 07/10/2007  . HYPERTENSION 07/10/2007  . RENAL INSUFFICIENCY 07/10/2007  . 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 Coronary artery bypass graft    . Appendectomy    . Carotid endarterectomy      right    History   Social History  . Marital Status: Married    Spouse Name: N/A    Number of Children: N/A  . Years of Education: N/A   Occupational History  . Not on file.   Social History Main Topics  . Smoking status: Former Games developermoker  . Smokeless tobacco: Never Used  . Alcohol Use: No  . Drug Use: No  . Sexual Activity: Not on file   Other Topics Concern  . Not on file   Social History Narrative   Says his diet and exercise are very good    Current Outpatient Prescriptions on File Prior to Visit  Medication Sig Dispense Refill  . aspirin 81 MG tablet Take 81 mg by mouth daily.        . B Complex-C-Folic Acid (FOLBEE PLUS) TABS Take 1 tablet by mouth daily.        . Cholecalciferol (VITAMIN D) 2000 UNITS CAPS Take 1 capsule by mouth daily.       . hydrochlorothiazide 25 MG tablet Take 12.5 mg by mouth daily.       . insulin glargine (LANTUS) 100 UNIT/ML  injection Inject 0.21 mLs (21 Units total) into the skin at bedtime.  10 mL  5  . insulin lispro (HUMALOG) 100 UNIT/ML injection 3 times a day (just before each meal) 07-13-11 units  10 mL  5  . isosorbide mononitrate (IMDUR) 60 MG 24 hr tablet TAKE 1 TABLET BY MOUTH ONCE DAILY  30 tablet  7  . omeprazole (PRILOSEC) 20 MG capsule Take 20 mg by mouth daily.         No current facility-administered medications on file prior to visit.    Allergies  Allergen Reactions  . Amlodipine Besylate   . Codeine     anxiety  . Lipitor [Atorvastatin Calcium]     myalgias  . Penicillins   . Valsartan     Family History  Problem Relation Age of Onset  . Cancer Neg Hx   . Heart disease Mother    BP 128/64  Pulse 67  Temp(Src) 98.7 F (37.1 C) (Oral)  Ht 5\' 10"  (1.778 m)  Wt 156 lb (70.761 kg)  BMI 22.38 kg/m2  SpO2 96%  Review of Systems Denies LOC and weight change.    Objective:   Physical Exam VITAL SIGNS:  See vs page GENERAL: no  distress SKIN:  Insulin injection sites at the anterior abdomen are normal.    Lab Results  Component Value Date   HGBA1C 11.4* 01/26/2014      Assessment & Plan:  DM: he needs increased rx.  This insulin regimen was chosen from multiple options, as it best matches his insulin to his changing requirements throughout the day.  However, control is poor.  The best option is pump and continuous glucose monitor.  However, he has declined these.  The next best option is a simpler regimen.  The benefits of glycemic control must be weighed against the risks of hypoglycemia.   CAD: in this setting, he should avoid hypoglycemia.   Renal insufficiency: this increases the duration of action of insulin.  This is considered in his insulin dosage.

## 2014-01-26 NOTE — Patient Instructions (Addendum)
blood tests are being requested for you today.  We'll contact you with results. Please come back for a follow-up appointment in 4 months.   Please increase humalog to 3x a day just before each meal 07-03-11 units.  However, Take 1 extra unit anytime it is over 200.  If you are going to be active, subtract 2 units from that injection. If you are not able to anticipate the activity, eat a light snack.   continue lantus, 21 units at bedtime.

## 2014-01-29 DIAGNOSIS — I10 Essential (primary) hypertension: Secondary | ICD-10-CM | POA: Diagnosis not present

## 2014-01-29 DIAGNOSIS — N390 Urinary tract infection, site not specified: Secondary | ICD-10-CM | POA: Diagnosis not present

## 2014-01-29 DIAGNOSIS — N183 Chronic kidney disease, stage 3 unspecified: Secondary | ICD-10-CM | POA: Diagnosis not present

## 2014-02-02 ENCOUNTER — Ambulatory Visit: Payer: Medicare Other | Admitting: Podiatry

## 2014-02-04 ENCOUNTER — Ambulatory Visit (INDEPENDENT_AMBULATORY_CARE_PROVIDER_SITE_OTHER): Payer: Medicare Other | Admitting: Podiatry

## 2014-02-04 ENCOUNTER — Ambulatory Visit (INDEPENDENT_AMBULATORY_CARE_PROVIDER_SITE_OTHER): Payer: Medicare Other | Admitting: Cardiovascular Disease

## 2014-02-04 ENCOUNTER — Encounter: Payer: Self-pay | Admitting: Cardiovascular Disease

## 2014-02-04 ENCOUNTER — Encounter: Payer: Self-pay | Admitting: Podiatry

## 2014-02-04 VITALS — BP 156/70 | HR 68 | Ht 70.0 in | Wt 153.4 lb

## 2014-02-04 VITALS — BP 103/55 | HR 55 | Resp 16 | Ht 70.0 in | Wt 154.0 lb

## 2014-02-04 DIAGNOSIS — N259 Disorder resulting from impaired renal tubular function, unspecified: Secondary | ICD-10-CM

## 2014-02-04 DIAGNOSIS — E1142 Type 2 diabetes mellitus with diabetic polyneuropathy: Secondary | ICD-10-CM | POA: Insufficient documentation

## 2014-02-04 DIAGNOSIS — M79609 Pain in unspecified limb: Secondary | ICD-10-CM | POA: Diagnosis not present

## 2014-02-04 DIAGNOSIS — R002 Palpitations: Secondary | ICD-10-CM | POA: Diagnosis not present

## 2014-02-04 DIAGNOSIS — E1029 Type 1 diabetes mellitus with other diabetic kidney complication: Secondary | ICD-10-CM

## 2014-02-04 DIAGNOSIS — I2510 Atherosclerotic heart disease of native coronary artery without angina pectoris: Secondary | ICD-10-CM | POA: Diagnosis not present

## 2014-02-04 DIAGNOSIS — E1065 Type 1 diabetes mellitus with hyperglycemia: Secondary | ICD-10-CM | POA: Diagnosis not present

## 2014-02-04 DIAGNOSIS — E11359 Type 2 diabetes mellitus with proliferative diabetic retinopathy without macular edema: Secondary | ICD-10-CM

## 2014-02-04 DIAGNOSIS — B351 Tinea unguium: Secondary | ICD-10-CM | POA: Diagnosis not present

## 2014-02-04 DIAGNOSIS — E782 Mixed hyperlipidemia: Secondary | ICD-10-CM

## 2014-02-04 DIAGNOSIS — E785 Hyperlipidemia, unspecified: Secondary | ICD-10-CM

## 2014-02-04 DIAGNOSIS — E1149 Type 2 diabetes mellitus with other diabetic neurological complication: Secondary | ICD-10-CM

## 2014-02-04 DIAGNOSIS — I10 Essential (primary) hypertension: Secondary | ICD-10-CM

## 2014-02-04 MED ORDER — LABETALOL HCL 200 MG PO TABS: ORAL_TABLET | ORAL | Status: DC

## 2014-02-04 NOTE — Patient Instructions (Addendum)
Your physician recommends that you return for lab work fasting.  Your physician has recommended that you wear an event monitor. Event monitors are medical devices that record the heart's electrical activity. Doctors most often us these monitors to diagnose arrhythmias. Arrhythmias are problems with the speed or rhythm of the heartbeat. The monitor is a small, portable device. You can wear one while you do your normal daily activities. This is usually used to diagnose what is causing palpitations/syncope (passing out). This will be scheduled in two weeks. It will be worn for 4 weeks.  Your physician has recommended you make the following change in your medication: increase the labetalol to 300 mg twice daily. This has already been sent to the pharmacy.  Your physician recommends that you schedule a follow-up appointment in: 2 months.

## 2014-02-04 NOTE — Progress Notes (Signed)
Pt presents for debridement and evaluation of diabetic feet. He is complaining today of uncomfortable toenails when walking and wearing shoes.  Objective: Orientated x3 diabetic 73 year old white male presents for ongoing debridement of mycotic toenails. The nails x10 are incurvated, discolored and hypertrophic.  Assessment: Symptomatic onychomycoses x10  Plan: Nails x10 are debrided back without any bleeding. Reappoint at 61 day intervals at patient's request.

## 2014-02-04 NOTE — Progress Notes (Signed)
Patient ID: Henry [name]., male   DOB: [birth year], 73 y.o.   MRN: [id number]     HPI: Henry [name]., is a 73 y.o. male who presents to the office today for sisx-month cardiology evaluation.  Mr. Henry [name] is a 59 year history of brittle type I diabetes. He has established coronary artery disease and in April 1999 suffered an inferior wall myocardial infarction which initially was complicated by third-degree heart block in the setting of his MI. He underwent PTCA of his RCA. Due to the extensive CAD he ultimately underwent elective CABG surgery. He also has peripheral vascular disease and is status post right carotid endarterectomy. At catheterization March 2011 he was found to have 95% stenosis in the graft supplying his RCA and underwent successful stenting with a 3.0x26 mm integrity stent. Her bare metal stent was used in light of his significant diabetic retinopathy with significant bleeding history to reduce his need for long-term dual antiplatelet therapy. Mr. Henry [name] has renal insufficiency and is followed by Dr. Rayna Sexton. Laboratory in 2013 showed a creatinine of 1.32.   Mr. Henry [name] has experienced several episodes of dizziness. He felt possibly to have an autonomic neuropathy. He denies frank syncope. Denies recurrent episodes of chest pain. He denies significant shortness of breath.  He tells me recently, his sugars have been very difficult to control. Recent hemoglobin A1c checked by Dr. Everardo All was elevated at 11.4. Dr. Everardo All is contemplating switching him to some of the older regimens of 70/30 Humulin insulin with NPH and this will be starting in several weeks.  Mr. Henry [name] seems to develop the beginning of right foot paresthesias but most likely her peripheral neuropathy. He denies recent episodes of chest pain. He is unaware of palpitations in the past has been documented to have ectopy which has been asymptomatic. He'll be going out of town for 10 days later this week.   Past Medical  History  Diagnosis Date  . ANEMIA-NOS 07/10/2007  . CORONARY ARTERY DISEASE 07/10/2007  . DIABETES MELLITUS, TYPE I 07/10/2007  . HYPERLIPIDEMIA 07/10/2007  . HYPERTENSION 07/10/2007  . RENAL INSUFFICIENCY 07/10/2007  . 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 Coronary artery bypass graft    . Appendectomy    . Carotid endarterectomy      right    Allergies  Allergen Reactions  . Amlodipine Besylate   . Codeine     anxiety  . Lipitor [Atorvastatin Calcium]     myalgias  . Penicillins   . Valsartan     Current Outpatient Prescriptions  Medication Sig Dispense Refill  . aspirin 81 MG tablet Take 81 mg by mouth daily.        . B Complex-C-Folic Acid (FOLBEE PLUS) TABS Take 1 tablet by mouth daily.        . Cholecalciferol (VITAMIN D) 2000 UNITS CAPS Take 1 capsule by mouth daily.       . hydrochlorothiazide 25 MG tablet Take 12.5 mg by mouth daily.       . insulin glargine (LANTUS) 100 UNIT/ML injection Inject 0.21 mLs (21 Units total) into the skin at bedtime.  10 mL  5  . insulin lispro (HUMALOG) 100 UNIT/ML injection 3 times a day (just before each meal) 07-13-11 units  10 mL  5  . isosorbide mononitrate (IMDUR) 60 MG 24 hr tablet TAKE 1 TABLET BY MOUTH ONCE DAILY  30 tablet  7  . labetalol (NORMODYNE) 200  MG tablet Take 100 mg by mouth 3 (three) times daily. Take 300 units in the am and 200 units in the pm.      . omeprazole (PRILOSEC) 20 MG capsule Take 20 mg by mouth daily.         No current facility-administered medications for this visit.    Socially he is married has 2 children and 2 grandchildren. No tobacco history or alcohol use. He is not routinely exercise.  ROS is negative for fevers, chills or night sweats. He denies rash. He denies recent visual symptoms. He has undergone multiple laser procedures for diabetic retinopathy.  He denies wheezing. He denies recent dizziness. He denies frank syncope.  He denies angina. He denies bleeding. He denies constipation, nausea vomiting or diarrhea. He is unaware of blood in stool or urine. Denies claudication.  He denies myalgias. He recently has noticed some right foot paresthesias. His diabetes has been brittle. There is no thyroid issues. He does have mild renal insufficiency. Other comprehensive 14 system review is negative.  PE BP 156/70  Pulse 68  Ht $R'5\' 10"'Fp$  (1.778 m)  Wt 153 lb 6.4 oz (69.582 kg)  BMI 22.01 kg/m2   Repeat BP by me 147/70 supine and 128/74 standing. General: Alert, oriented, no distress.  Skin: normal turgor, no rashes HEENT: Normocephalic, atraumatic. Pupils round and reactive; sclera anicteric;no lid lag.  Nose without nasal septal hypertrophy no xanthelasmas Mouth/Parynx benign; Mallinpatti scale 2 Neck: No JVD; he is status post right carotid endarterectomy. He does have  bilateral bruits left greater than right. Lungs: clear to ausculatation and percussion; no wheezing or rales Chest wall: Nontender to palpation Heart: Frequent ectopy on auscultation., s1 s2 normal 2/6 systolic murmur with a diastolic murmur. Abdomen: soft, nontender; no hepatosplenomehaly, BS+; abdominal aorta nontender and not dilated by palpation. No renal artery bruits. Pulses 2+ Extremities: no clubbing cyanosis or edema, Homan's sign negative  Neurologic: grossly nonfocal Psychological: Normal affect and mood   ECG  (and apparently read by me): Sinus rhythm at 68 beats per minute with frequent PVCs unifocal.  Prior ECG of July 2014: Sinus rhythm at 64 beats per minute. QTc interval 400 ms. One isolated PAC. Small nondiagnostic Q waves inferiorly with preserved R waves.  LABS:  BMET    Component Value Date/Time   NA 128* 03/18/2010 0328   K 3.7 03/18/2010 0328   CL 98 03/18/2010 0328   CO2 20 04/06/2010   GLUCOSE 108 04/06/2010   BUN 29 04/06/2010   CREATININE 1.13 04/06/2010   CALCIUM 9.5 06/13/2013 0840   GFRNONAA >60 04/06/2010   GFRAA   Value: 59        The eGFR has been calculated using the MDRD equation. This calculation has not been validated in all clinical situations. eGFR's persistently <60 mL/min signify possible Chronic Kidney Disease.* 03/18/2010 0328     Hepatic Function Panel     Component Value Date/Time   PROT 6.8 06/13/2013 0840   ALBUMIN 3.8 06/13/2013 0840   AST 27 06/13/2013 0840   ALT 21 06/13/2013 0840   ALKPHOS 68 06/13/2013 0840   BILITOT 0.8 06/13/2013 0840   BILIDIR 0.1 06/13/2013 0840     CBC    Component Value Date/Time   WBC 6.2 06/13/2013 0840   RBC 3.77* 06/13/2013 0840   HGB 11.6* 06/13/2013 0840   HCT 34.9* 06/13/2013 0840   PLT 219.0 06/13/2013 0840   MCV 92.7 06/13/2013 0840   MCHC 33.2 06/13/2013 0840  RDW 13.7 06/13/2013 0840   LYMPHSABS 2.1 06/13/2013 0840   MONOABS 0.6 06/13/2013 0840   EOSABS 0.2 06/13/2013 0840   BASOSABS 0.0 06/13/2013 0840     BNP No results found for this basename: probnp    Lipid Panel     Component Value Date/Time   CHOL 166 06/13/2013 0840   TRIG 87.0 06/13/2013 0840   HDL 44.60 06/13/2013 0840   CHOLHDL 4 06/13/2013 0840   VLDL 17.4 06/13/2013 0840   LDLCALC 104* 06/13/2013 0840     RADIOLOGY: No results found.    ASSESSMENT AND PLAN: Mr. Enke  He is now 16 years following his inferior wall myocardial infarction which was treated successfully with PTCA of his right coronary artery with resultant significant myocardial salvage. Due to concomitant CAD he underwent CABG surgery with a LIMA to his LAD, vein to diagonal, sequential vein to OM1 and distal circumflex, and SVG to his right coronary artery. He status post stenting of the proximal portion of the vein graft to his right coronary artery in 2011. His last nuclear perfusion study was in November 2012 remained low risk and history minimal region of inferior scar. His last carotid studies were June 2013 which showed mild plaque without significant stenoses. Presently, he does not have any orthostatic  hypotension. His last echo Doppler study was done in February 2014 which showed an ejection fraction of 50-55%. Mitral annular calcification with mild MR. His right ventricle was dilated. He did have mild TR and mild MR. 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 Mr. Radler continues to have fairly asymptomatic ectopy. I am recommending an attempt at slight additional titration of his beta blocker therapy which is labetalol which she currently is taking 300 mg in the morning and 2 mg at night and I will try to titrate this to 300 mg twice a day. Following history of I am recommending he return for a CardioNet monitor so we can monitor his heart rhythm for 2-4 weeks. We will check laboratory in a fasting state. Target LDL is less than 70. Currently he's not on current lipid-lowering therapy. In the past, he did develop intolerance to Lipitor. I will see him in 2 months for cardiology reevaluation or sooner if problems develop.   Troy Sine, MD, [facility]  02/04/2014 9:45 AM

## 2014-02-06 LAB — COMPREHENSIVE METABOLIC PANEL
ALK PHOS: 78 U/L (ref 39–117)
ALT: 17 U/L (ref 0–53)
AST: 20 U/L (ref 0–37)
Albumin: 4.1 g/dL (ref 3.5–5.2)
BILIRUBIN TOTAL: 0.4 mg/dL (ref 0.2–1.2)
BUN: 33 mg/dL — AB (ref 6–23)
CO2: 27 mEq/L (ref 19–32)
Calcium: 9.9 mg/dL (ref 8.4–10.5)
Chloride: 103 mEq/L (ref 96–112)
Creat: 1.35 mg/dL (ref 0.50–1.35)
GLUCOSE: 313 mg/dL — AB (ref 70–99)
POTASSIUM: 4.6 meq/L (ref 3.5–5.3)
SODIUM: 137 meq/L (ref 135–145)
TOTAL PROTEIN: 6.9 g/dL (ref 6.0–8.3)

## 2014-02-06 LAB — LIPID PANEL
CHOL/HDL RATIO: 3.5 ratio
Cholesterol: 156 mg/dL (ref 0–200)
HDL: 44 mg/dL (ref >39)
LDL CALC: 92 mg/dL (ref 0–99)
Triglycerides: 99 mg/dL (ref <150)
VLDL: 20 mg/dL (ref 0–40)

## 2014-02-06 LAB — CBC
HCT: 37.5 % — ABNORMAL LOW (ref 39.0–52.0)
HEMOGLOBIN: 12.4 g/dL — AB (ref 13.0–17.0)
MCH: 30.2 pg (ref 26.0–34.0)
MCHC: 33.1 g/dL (ref 30.0–36.0)
MCV: 91.2 fL (ref 78.0–100.0)
Platelets: 288 10*3/uL (ref 150–400)
RBC: 4.11 MIL/uL — AB (ref 4.22–5.81)
RDW: 13.6 % (ref 11.5–15.5)
WBC: 6.8 10*3/uL (ref 4.0–10.5)

## 2014-02-06 LAB — TSH: TSH: 2.726 u[IU]/mL (ref 0.350–4.500)

## 2014-02-18 ENCOUNTER — Telehealth: Payer: Self-pay | Admitting: *Deleted

## 2014-02-18 ENCOUNTER — Ambulatory Visit (INDEPENDENT_AMBULATORY_CARE_PROVIDER_SITE_OTHER): Payer: Medicare Other | Admitting: *Deleted

## 2014-02-18 DIAGNOSIS — R002 Palpitations: Secondary | ICD-10-CM

## 2014-02-18 DIAGNOSIS — Z79899 Other long term (current) drug therapy: Secondary | ICD-10-CM

## 2014-02-18 NOTE — Patient Instructions (Signed)
Your physician has recommended that you wear an event monitor. Event monitors are medical devices that record the heart's electrical activity. Doctors most often us these monitors to diagnose arrhythmias. Arrhythmias are problems with the speed or rhythm of the heartbeat. The monitor is a small, portable device. You can wear one while you do your normal daily activities. This is usually used to diagnose what is causing palpitations/syncope (passing out).  You will wear this for 30 days.   After you finish the supplies in the first package, call the 800 number on the back of the info booklet to request more electrodes (stickers) and batteries.   Once you finish the 30 days, package everything in the silver box and take to a [employer] store or call them and they will pick it up at your home.

## 2014-03-12 DIAGNOSIS — I10 Essential (primary) hypertension: Secondary | ICD-10-CM | POA: Diagnosis not present

## 2014-03-12 DIAGNOSIS — N183 Chronic kidney disease, stage 3 unspecified: Secondary | ICD-10-CM | POA: Diagnosis not present

## 2014-03-12 DIAGNOSIS — E119 Type 2 diabetes mellitus without complications: Secondary | ICD-10-CM | POA: Diagnosis not present

## 2014-03-12 DIAGNOSIS — N390 Urinary tract infection, site not specified: Secondary | ICD-10-CM | POA: Diagnosis not present

## 2014-03-18 DIAGNOSIS — R002 Palpitations: Secondary | ICD-10-CM | POA: Diagnosis not present

## 2014-04-01 ENCOUNTER — Encounter: Payer: Self-pay | Admitting: *Deleted

## 2014-04-06 ENCOUNTER — Encounter: Payer: Self-pay | Admitting: Cardiovascular Disease

## 2014-04-06 ENCOUNTER — Other Ambulatory Visit: Payer: Self-pay | Admitting: *Deleted

## 2014-04-06 ENCOUNTER — Ambulatory Visit (INDEPENDENT_AMBULATORY_CARE_PROVIDER_SITE_OTHER): Payer: Medicare Other | Admitting: Cardiovascular Disease

## 2014-04-06 VITALS — BP 142/60 | HR 59 | Ht 70.0 in | Wt 154.0 lb

## 2014-04-06 DIAGNOSIS — R002 Palpitations: Secondary | ICD-10-CM

## 2014-04-06 DIAGNOSIS — E11359 Type 2 diabetes mellitus with proliferative diabetic retinopathy without macular edema: Secondary | ICD-10-CM

## 2014-04-06 DIAGNOSIS — I4729 Other ventricular tachycardia: Secondary | ICD-10-CM

## 2014-04-06 DIAGNOSIS — I2510 Atherosclerotic heart disease of native coronary artery without angina pectoris: Secondary | ICD-10-CM

## 2014-04-06 DIAGNOSIS — D649 Anemia, unspecified: Secondary | ICD-10-CM | POA: Diagnosis not present

## 2014-04-06 DIAGNOSIS — E785 Hyperlipidemia, unspecified: Secondary | ICD-10-CM

## 2014-04-06 DIAGNOSIS — I4949 Other premature depolarization: Secondary | ICD-10-CM

## 2014-04-06 DIAGNOSIS — N259 Disorder resulting from impaired renal tubular function, unspecified: Secondary | ICD-10-CM

## 2014-04-06 DIAGNOSIS — E1142 Type 2 diabetes mellitus with diabetic polyneuropathy: Secondary | ICD-10-CM

## 2014-04-06 DIAGNOSIS — E1065 Type 1 diabetes mellitus with hyperglycemia: Secondary | ICD-10-CM

## 2014-04-06 DIAGNOSIS — I493 Ventricular premature depolarization: Secondary | ICD-10-CM

## 2014-04-06 DIAGNOSIS — I472 Ventricular tachycardia: Secondary | ICD-10-CM

## 2014-04-06 DIAGNOSIS — E1149 Type 2 diabetes mellitus with other diabetic neurological complication: Secondary | ICD-10-CM

## 2014-04-06 DIAGNOSIS — E1029 Type 1 diabetes mellitus with other diabetic kidney complication: Secondary | ICD-10-CM

## 2014-04-06 DIAGNOSIS — I10 Essential (primary) hypertension: Secondary | ICD-10-CM | POA: Diagnosis not present

## 2014-04-06 MED ORDER — EZETIMIBE 10 MG PO TABS: 10.0000 mg | ORAL_TABLET | Freq: Every day | ORAL | Status: DC

## 2014-04-06 MED ORDER — EZETIMIBE 10 MG PO TABS
10.0000 mg | ORAL_TABLET | Freq: Every day | ORAL | Status: DC
Start: 2014-04-06 — End: 2014-09-24

## 2014-04-06 NOTE — Progress Notes (Signed)
Patient ID: Henry [name]., male   DOB: 03/31/1941, 73 y.o.   MRN: [id number]      HPI: Henry [name]. is a 73 y.o. male who presents to the office today for 1-month cardiology evaluation.  Mr. Heard is a 52 year WM with a history of brittle type I diabetes. He has established coronary artery disease and in April 1999 suffered an inferior wall myocardial infarction which initially was complicated by third-degree heart block in the setting of his MI. He underwent PTCA of his RCA. Due to the extensive CAD he ultimately underwent elective CABG surgery. He also has peripheral vascular disease and is status post right carotid endarterectomy. At catheterization March 2011 he was found to have 95% stenosis in the graft supplying his RCA and underwent successful stenting with a 3.0x26 mm integrity stent. A bare metal stent was used in light of his significant diabetic retinopathy with significant bleeding history to reduce his need for long-term dual antiplatelet therapy. Mr. Melucci has renal insufficiency and is followed by Dr. Dimas Aguas. Laboratory in 2013 showed a creatinine of 1.32.   Mr. Kerce has experienced several episodes of dizziness. He felt possibly to have an autonomic neuropathy. He denies frank syncope. Denies recurrent episodes of chest pain. He denies significant shortness of breath.  He tells me recently, his sugars have been very difficult to control. Recent hemoglobin A1c checked by Dr. Loanne Drilling was elevated at 11.4.Mr. 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  I last saw Mr. Baumgardner, I recommended a very slight increase in his labetalol dose from 300 mg in the morning and 200 mg at night to 300 mg twice a day.  Apparently, he then saw his renal physician, Dr.Igwemezie in [facility] , who recommended that he reduce his dose back to the previous dose.  Mr. Gildersleeve did wear a CardioNet monitor which was supposed to be done on  the higher dose, but apparently was done on the prior reduce dose from 02/18/2014 through 03/19/2014.  His lowest heart rate was 50 beats per minute and his highest heart rate was 120 beats per minute.  His average heart rate was 66 beats per minute.  He did have occasional PVCs, predominately unifocal.  However, he did have several episodes of 4 beats of ventricular tachycardia at a rate of 114 and then another episode of 7 beats of nonsustained ventricular tachycardia.  He denies recent episodes of chest pain. He is unaware of palpitations in the past has been documented to have ectopy which has been asymptomatic.  He did bring with him today.  A blood pressure log over the past month.  His blood pressure for the most part has been in the 125-140 range, but at times.  He has had episodes where his blood pressure has risen into the 160s 170s and even 923 systolically.  He presents now for evaluation.  Past Medical History  Diagnosis Date  . ANEMIA-NOS 07/10/2007  . CORONARY ARTERY DISEASE 07/10/2007  . DIABETES MELLITUS, TYPE I 07/10/2007  . HYPERLIPIDEMIA 07/10/2007  . HYPERTENSION 07/10/2007  . RENAL INSUFFICIENCY 07/10/2007  . 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   Marland Kitchen Appendectomy    . Carotid endarterectomy Right     right  . Cardiac catheterization  stenting in the vein graft to the right coronary artery and had_DES 3x 26 mm Integrity stent inserted, post dilated to 3.4 mm    Allergies  Allergen Reactions  . Amlodipine Besylate   . Codeine     anxiety  . Lipitor [Atorvastatin Calcium]     myalgias  . Penicillins   . Valsartan     Current Outpatient Prescriptions  Medication Sig Dispense Refill  . aspirin 81 MG tablet Take 81 mg by mouth daily.        . B Complex-C-Folic Acid (FOLBEE PLUS) TABS Take 1 tablet by mouth daily.        .  Cholecalciferol (VITAMIN D) 2000 UNITS CAPS Take 1 capsule by mouth daily.       . hydrochlorothiazide 25 MG tablet Take 12.5 mg by mouth daily.       . insulin glargine (LANTUS) 100 UNIT/ML injection Inject 0.21 mLs (21 Units total) into the skin at bedtime.  10 mL  5  . insulin lispro (HUMALOG) 100 UNIT/ML injection 3 times a day (just before each meal) 07-03-11 units      . isosorbide mononitrate (IMDUR) 60 MG 24 hr tablet TAKE 1 TABLET BY MOUTH ONCE DAILY  30 tablet  7  . labetalol (NORMODYNE) 200 MG tablet Take 1 & 1/2 tablet in the AM and 1 in the PM      . omeprazole (PRILOSEC) 20 MG capsule Take 20 mg by mouth daily.        Marland Kitchen ezetimibe (ZETIA) 10 MG tablet Take 1 tablet (10 mg total) by mouth daily.  42 tablet  0   No current facility-administered medications for this visit.    Socially he is married has 2 children and 2 grandchildren. No tobacco history or alcohol use. He is not routinely exercise.  ROS is negative for fevers, chills or night sweats. He denies rash. He denies recent visual symptoms. He has undergone multiple laser procedures for diabetic retinopathy.  He denies wheezing. He denies recent dizziness. He denies frank syncope.  He is unaware of palpitations.  He denies angina. He denies bleeding. He denies constipation, nausea vomiting or diarrhea. He is unaware of blood in stool or urine. Denies claudication.  He denies myalgias. He recently has noticed some right foot paresthesias. His diabetes has been brittle. There is no thyroid issues. He does have mild renal insufficiency. Other comprehensive 14 system review is negative.  PE BP 142/60  Pulse 59  Ht $R'5\' 10"'Zf$  (1.778 m)  Wt 154 lb (69.854 kg)  BMI 22.10 kg/m2   Repeat BP by me was 158/70. General: Alert, oriented, no distress.  Skin: normal turgor, no rashes HEENT: Normocephalic, atraumatic. Pupils round and reactive; sclera anicteric;no lid lag.  Nose without nasal septal hypertrophy no xanthelasmas Mouth/Parynx  benign; Mallinpatti scale 2 Neck: No JVD; he is status post right carotid endarterectomy. He does have  bilateral bruits left greater than right. Lungs: clear to ausculatation and percussion; no wheezing or rales Chest wall: Nontender to palpation Heart: Frequent ectopy on auscultation., s1 s2 normal 2/6 systolic murmur with a diastolic murmur. Abdomen: soft, nontender; no hepatosplenomehaly, BS+; abdominal aorta nontender and not dilated by palpation. No renal artery bruits. Pulses 2+ Extremities: no clubbing cyanosis or edema, Homan's sign negative  Neurologic: grossly nonfocal Psychological: Normal affect and mood  ECG (independently read by me): Sinus rhythm at 59 beats per minute.  Nonspecific ST changes.   Prior 02/04/2014 ECG  (independently read by me): Sinus  rhythm at 68 beats per minute with frequent PVCs unifocal.  Prior ECG of July 2014: Sinus rhythm at 64 beats per minute. QTc interval 400 ms. One isolated PAC. Small nondiagnostic Q waves inferiorly with preserved R waves.  LABS:  BMET    Component Value Date/Time   NA 137 02/04/2014 0808   K 4.6 02/04/2014 0808   CL 103 02/04/2014 0808   CO2 27 02/04/2014 0808   GLUCOSE 313* 02/04/2014 0808   BUN 33* 02/04/2014 0808   CREATININE 1.35 02/04/2014 0808   CREATININE 1.13 04/06/2010   CALCIUM 9.9 02/04/2014 0808   GFRNONAA >60 04/06/2010   GFRAA  Value: 59        The eGFR has been calculated using the MDRD equation. This calculation has not been validated in all clinical situations. eGFR's persistently <60 mL/min signify possible Chronic Kidney Disease.* 03/18/2010 0328     Hepatic Function Panel     Component Value Date/Time   PROT 6.9 02/04/2014 0808   ALBUMIN 4.1 02/04/2014 0808   AST 20 02/04/2014 0808   ALT 17 02/04/2014 0808   ALKPHOS 78 02/04/2014 0808   BILITOT 0.4 02/04/2014 0808   BILIDIR 0.1 06/13/2013 0840     CBC    Component Value Date/Time   WBC 6.8 02/04/2014 0808   RBC 4.11* 02/04/2014 0808   HGB 12.4*  02/04/2014 0808   HCT 37.5* 02/04/2014 0808   PLT 288 02/04/2014 0808   MCV 91.2 02/04/2014 0808   MCH 30.2 02/04/2014 0808   MCHC 33.1 02/04/2014 0808   RDW 13.6 02/04/2014 0808   LYMPHSABS 2.1 06/13/2013 0840   MONOABS 0.6 06/13/2013 0840   EOSABS 0.2 06/13/2013 0840   BASOSABS 0.0 06/13/2013 0840     BNP No results found for this basename: probnp    Lipid Panel     Component Value Date/Time   CHOL 156 02/04/2014 0808   TRIG 99 02/04/2014 0808   HDL 44 02/04/2014 0808   CHOLHDL 3.5 02/04/2014 0808   VLDL 20 02/04/2014 0808   LDLCALC 92 02/04/2014 0808     RADIOLOGY: No results found.    ASSESSMENT AND PLAN: Mr. Mena  Is 16 years following his inferior wall myocardial infarction which was treated successfully with PTCA of his right coronary artery with resultant significant myocardial salvage. Due to concomitant CAD he underwent CABG surgery with a LIMA to his LAD, vein to diagonal, sequential vein to OM1 and distal circumflex, and SVG to his right coronary artery. He status post stenting of the proximal portion of the vein graft to his right coronary artery in 2011. His last nuclear perfusion study was in November 2012 remained low risk and history minimal region of inferior scar. His last carotid studies were June 2013 which showed mild plaque without significant stenoses.  His last echo Doppler study was done in February 2014 which showed an ejection fraction of 50-55%. Mitral annular calcification with mild MR. His right ventricle was dilated. He did have mild TR and mild MR. 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 Mr. Mcnealy continues to have fairly asymptomatic ectopy.  When I last saw him, I recommended very slight titration of his labetalol from 300 mg in the morning and 2 mg at night to 300 mg twice a day.  Apparently, he had taken this for 3 days and then when he saw his nephrologist.  He resumed the pre-prior dose.  His CardioNet monitor does show  occasional to frequent  PVCs, predominantly unifocal.  However, he has had several short episodes of nonsustained VT.  He also recently has had some blood pressure lability with systolic blood pressures in the 161 7180 range.  An attempt to compromise with his nephrologist, I recommended he increase his evening dose of labetalol from 200-250 mg.  I'm also recommending the addition of Zetia 10 mg for his hyperlipidemia.  He is diabetic, is status post prior MI and CABG surgery with plans for target LDL less than 70.  In the past, he had side effects with statin therapy.  He will be seeing his nephrologist, fairly soon I would recommend repeat laboratory be obtained at that time, and forwarded to me for my review.  I will see him in 4 months for followup evaluation.   Troy Sine, MD, [facility]  04/06/2014 7:40 PM

## 2014-04-06 NOTE — Patient Instructions (Addendum)
Your physician has recommended you make the following change in your medication: change your LABETALOL to 300 mg in the AM and 250 mg in the PM.  Start new prescription for zetia. This has already been sent to the pharmacy.   Your physician recommends that you schedule a follow-up appointment in: 4 months.   Zetia samples 6 packs LOT [id number] EXP 05/2016

## 2014-04-08 ENCOUNTER — Ambulatory Visit (INDEPENDENT_AMBULATORY_CARE_PROVIDER_SITE_OTHER): Payer: Medicare Other | Admitting: Podiatry

## 2014-04-08 ENCOUNTER — Encounter: Payer: Self-pay | Admitting: Podiatry

## 2014-04-08 VITALS — BP 149/62 | HR 67 | Resp 12

## 2014-04-08 DIAGNOSIS — R0989 Other specified symptoms and signs involving the circulatory and respiratory systems: Secondary | ICD-10-CM

## 2014-04-08 DIAGNOSIS — Q828 Other specified congenital malformations of skin: Secondary | ICD-10-CM | POA: Diagnosis not present

## 2014-04-08 DIAGNOSIS — I2510 Atherosclerotic heart disease of native coronary artery without angina pectoris: Secondary | ICD-10-CM

## 2014-04-08 DIAGNOSIS — B351 Tinea unguium: Secondary | ICD-10-CM

## 2014-04-08 DIAGNOSIS — M79609 Pain in unspecified limb: Secondary | ICD-10-CM

## 2014-04-08 NOTE — Patient Instructions (Signed)
The vascular lab will contact you to schedule a lower extremity arterial Doppler  Diabetes and Foot Care Diabetes may cause you to have problems because of poor blood supply (circulation) to your feet and legs. This may cause the skin on your feet to become thinner, break easier, and heal more slowly. Your skin may become dry, and the skin may peel and crack. You may also have nerve damage in your legs and feet causing decreased feeling in them. You may not notice minor injuries to your feet that could lead to infections or more serious problems. Taking care of your feet is one of the most important things you can do for yourself.  HOME CARE INSTRUCTIONS  Wear shoes at all times, even in the house. Do not go barefoot. Bare feet are easily injured.  Check your feet daily for blisters, cuts, and redness. If you cannot see the bottom of your feet, use a mirror or ask someone for help.  Wash your feet with warm water (do not use hot water) and mild soap. Then pat your feet and the areas between your toes until they are completely dry. Do not soak your feet as this can dry your skin.  Apply a moisturizing lotion or petroleum jelly (that does not contain alcohol and is unscented) to the skin on your feet and to dry, brittle toenails. Do not apply lotion between your toes.  Trim your toenails straight across. Do not dig under them or around the cuticle. File the edges of your nails with an emery board or nail file.  Do not cut corns or calluses or try to remove them with medicine.  Wear clean socks or stockings every day. Make sure they are not too tight. Do not wear knee-high stockings since they may decrease blood flow to your legs.  Wear shoes that fit properly and have enough cushioning. To break in new shoes, wear them for just a few hours a day. This prevents you from injuring your feet. Always look in your shoes before you put them on to be sure there are no objects inside.  Do not cross your  legs. This may decrease the blood flow to your feet.  If you find a minor scrape, cut, or break in the skin on your feet, keep it and the skin around it clean and dry. These areas may be cleansed with mild soap and water. Do not cleanse the area with peroxide, alcohol, or iodine.  When you remove an adhesive bandage, be sure not to damage the skin around it.  If you have a wound, look at it several times a day to make sure it is healing.  Do not use heating pads or hot water bottles. They may burn your skin. If you have lost feeling in your feet or legs, you may not know it is happening until it is too late.  Make sure your health care provider performs a complete foot exam at least annually or more often if you have foot problems. Report any cuts, sores, or bruises to your health care provider immediately. SEEK MEDICAL CARE IF:   You have an injury that is not healing.  You have cuts or breaks in the skin.  You have an ingrown nail.  You notice redness on your legs or feet.  You feel burning or tingling in your legs or feet.  You have pain or cramps in your legs and feet.  Your legs or feet are numb.  Your feet   always feel cold. SEEK IMMEDIATE MEDICAL CARE IF:   There is increasing redness, swelling, or pain in or around a wound.  There is a red line that goes up your leg.  Pus is coming from a wound.  You develop a fever or as directed by your health care provider.  You notice a bad smell coming from an ulcer or wound. Document Released: 12/08/2000 Document Revised: 08/13/2013 Document Reviewed: 05/20/2013 Exit[employer] Patient Information 2014 [employer].  

## 2014-04-09 ENCOUNTER — Encounter: Payer: Self-pay | Admitting: Podiatry

## 2014-04-09 NOTE — Progress Notes (Signed)
Patient ID: Henry LessGlenn T Shearman Jr., male   DOB: 26-Feb-1941, 73 y.o.   MRN: [id number] Subjective: This patient presents for ongoing debridement of painful mycotic toenails and keratoses. It is a type I diabetic.  Objective:  Vascular: DP and PT pulses 1/4 bilaterally  Neurological: Sensation detained in monofilament wire intact 5/5 bilaterally. Vibratory sensation intact bilaterally. Ankle reflexes reactive bilaterally.   Dermatological: Elongated, discolored, incurvated toenails x10. Nucleated plantar keratoses fifth right MPJ noted  Musculoskeletal: Bilateral Taylor's bunion is noted  Assessment: Decrease pedal pulses/rule out peripheral tear disease Neurological status intact Symptomatic onychomycoses x10 Porokeratoses x1  Plan: Nails x10 keratoses x1 debrided without any bleeding Patient referred to vascular lab for lower extremity arterial Doppler for the indication of decrease pedal pulses and diabetes. Notify patient on receipt of vascular lab.  Reappoint at 61 days intervals for skin and nail debridement

## 2014-04-29 ENCOUNTER — Ambulatory Visit (HOSPITAL_COMMUNITY)
Admission: RE | Admit: 2014-04-29 | Discharge: 2014-04-29 | Disposition: A | Discharge status: Home / Self Care | Payer: Medicare Other | Source: Ambulatory Visit | Attending: Cardiovascular Disease | Admitting: Cardiovascular Disease

## 2014-04-29 DIAGNOSIS — R0989 Other specified symptoms and signs involving the circulatory and respiratory systems: Secondary | ICD-10-CM | POA: Insufficient documentation

## 2014-04-29 NOTE — Progress Notes (Signed)
Lower Extremity Arterial Duplex Completed. °Brianna L Mazza,RVT °

## 2014-05-13 ENCOUNTER — Telehealth: Payer: Self-pay | Admitting: *Deleted

## 2014-05-13 DIAGNOSIS — I739 Peripheral vascular disease, unspecified: Secondary | ICD-10-CM

## 2014-05-13 NOTE — Telephone Encounter (Signed)
I called and informed the patient that his doppler study was abnormal per Dr. Leeanne Deeduchman.  He wants to refer you to Dr. Allyson SabalBerry.  Patient stated that he is already a patient of Dr. Nicholaus BloomKelley and would like to continue seeing him.  I told him that's fine we'll send the referral to Dr. Nicholaus BloomKelley.  He stated his appointment isn't until August.  I told him they may get him in before then.

## 2014-05-13 NOTE — Telephone Encounter (Signed)
Message copied by Enedina FinnerMEADOWS, Docie Abramovich J on Wed May 13, 2014  9:13 AM ------      Message from: Carrington ClampUCHMAN, RICHARD C      Created: Thu May 07, 2014 12:16 PM       Results of lower extremity arterial Doppler dated 04/29/2014            Summary: This is an abnormal lower extremity arterial duplex Doppler            Contact patient advise followup is indicated with Dr. Allyson SabalBerry      Schedule evaluation with Dr. Allyson SabalBerry ------

## 2014-05-27 ENCOUNTER — Telehealth: Payer: Self-pay | Admitting: *Deleted

## 2014-05-27 ENCOUNTER — Encounter: Payer: Self-pay | Admitting: Endocrinology

## 2014-05-27 ENCOUNTER — Ambulatory Visit (INDEPENDENT_AMBULATORY_CARE_PROVIDER_SITE_OTHER): Payer: Medicare Other | Admitting: Endocrinology

## 2014-05-27 VITALS — BP 126/64 | HR 62 | Temp 97.7°F | Resp 12 | Wt 155.0 lb

## 2014-05-27 DIAGNOSIS — E1029 Type 1 diabetes mellitus with other diabetic kidney complication: Secondary | ICD-10-CM

## 2014-05-27 DIAGNOSIS — E1065 Type 1 diabetes mellitus with hyperglycemia: Secondary | ICD-10-CM | POA: Diagnosis not present

## 2014-05-27 DIAGNOSIS — I2510 Atherosclerotic heart disease of native coronary artery without angina pectoris: Secondary | ICD-10-CM | POA: Diagnosis not present

## 2014-05-27 MED ORDER — INSULIN NPH ISOPHANE & REGULAR (70-30) 100 UNIT/ML ~~LOC~~ SUSP: SUBCUTANEOUS | Status: DC

## 2014-05-27 NOTE — Telephone Encounter (Signed)
Left voicemail informing that pt does not need to fast for labs in September.

## 2014-05-27 NOTE — Telephone Encounter (Signed)
DO HE NEED TO FAST FOR HIS PHYSICAL IN September IF SO CAN YOU CALL HIM

## 2014-05-27 NOTE — Progress Notes (Signed)
Subjective:    Patient ID: Henry Less., male    DOB: Nov 13, 1941, 73 y.o.   MRN: [id number]  HPI Pt returns for f/u of type 1 dm (dx'ed 1961; he has mild if any neuropathy of the lower extremities; he has associated complications of renal insufficiency, CAD, PAD, and retinopathy; he has declined pump and continuous glucose monitor; last episode of severe hypoglycemia was approx 2008; he has never had DKA; he takes multiple daily injections).  He continues to be discouraged by ongoing widely-varying cbg's.  It varies from 82-350.  There is no trend throughout the day.   Past Medical History  Diagnosis Date  . ANEMIA-NOS 07/10/2007  . CORONARY ARTERY DISEASE 07/10/2007  . DIABETES MELLITUS, TYPE I 07/10/2007  . HYPERLIPIDEMIA 07/10/2007  . HYPERTENSION 07/10/2007  . RENAL INSUFFICIENCY 07/10/2007  . 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   Marland Kitchen Appendectomy    . Carotid endarterectomy Right     right  . Cardiac catheterization      stenting in the vein graft to the right coronary artery and had_DES 3x 26 mm Integrity stent inserted, post dilated to 3.4 mm    History   Social History  . Marital Status: Married    Spouse Name: N/A    Number of Children: N/A  . Years of Education: N/A   Occupational History  . Not on file.   Social History Main Topics  . Smoking status: Former Games developer  . Smokeless tobacco: Never Used  . Alcohol Use: No  . Drug Use: No  . Sexual Activity: Not on file   Other Topics Concern  . Not on file   Social History Narrative   Says his diet and exercise are very good    Current Outpatient Prescriptions on File Prior to Visit  Medication Sig Dispense Refill  . aspirin 81 MG tablet Take 81 mg by mouth daily.        . B Complex-C-Folic Acid (FOLBEE PLUS) TABS Take 1 tablet by mouth daily.        .  Cholecalciferol (VITAMIN D) 2000 UNITS CAPS Take 1 capsule by mouth daily.       Marland Kitchen ezetimibe (ZETIA) 10 MG tablet Take 1 tablet (10 mg total) by mouth daily.  42 tablet  0  . hydrochlorothiazide 25 MG tablet Take 12.5 mg by mouth daily.       . isosorbide mononitrate (IMDUR) 60 MG 24 hr tablet TAKE 1 TABLET BY MOUTH ONCE DAILY  30 tablet  7  . labetalol (NORMODYNE) 200 MG tablet Take 1 & 1/2 tablet in the AM and 1 in the PM      . omeprazole (PRILOSEC) 20 MG capsule Take 20 mg by mouth daily.         No current facility-administered medications on file prior to visit.    Allergies  Allergen Reactions  . Amlodipine Besylate   . Codeine     anxiety  . Lipitor [Atorvastatin Calcium]     myalgias  . Penicillins   . Valsartan     Family History  Problem Relation Age of Onset  . Cancer Neg Hx   . Heart disease Mother     BP 126/64  Pulse 62  Temp(Src) 97.7 F (36.5 C) (Oral)  Resp 12  Wt 155 lb (70.308 kg)  SpO2  97%    Review of Systems Denies LOC and weight change.      Objective:   Physical Exam VITAL SIGNS:  See vs page GENERAL: no distress Pulses: dorsalis pedis intact bilat.  Feet: no deformity. no ulcer on the feet. feet are of normal color and temp. Trace bilat leg edema, and patchy hyperpigmentation. There is bilateral onychomycosis. There is an old healed vein harvest scar at the left leg.  Neuro: sensation is intact to touch on the feet.    Lab Results  Component Value Date   HGBA1C 11.4* 01/26/2014      Assessment & Plan:  DM: severe exacerbation. He may do better with a simpler regimen. CAD: in this setting, he should avoid hypoglycemia.  This impairs the ability to achieve glycemic control.  I'll work around this as best I can Renal insufficiency: this increases the duration of action of insulin.  This is considered in his insulin dosage.    Patient is advised the following: Patient Instructions  Please come back for a regular physical appointment in  3 months.   Please change both current insulins to "70/30," 27 units with breakfast (25 if you are going to be active), and 20 units with the evening meal.   Please call in a few days to report progress.   check your blood sugar 4 times a day: before the 3 meals, and at bedtime.  also check if you have symptoms of your blood sugar being too high or too low.  please keep a record of the readings and bring it to your next appointment here.  You can write it on any piece of paper.  please call us sooner if your blood sugar goes below 70, or if you have a lot of readings over 200.

## 2014-05-27 NOTE — Patient Instructions (Addendum)
Please come back for a regular physical appointment in 3 months.   Please change both current insulins to "70/30," 27 units with breakfast (25 if you are going to be active), and 20 units with the evening meal.   Please call in a few days to report progress.   check your blood sugar 4 times a day: before the 3 meals, and at bedtime.  also check if you have symptoms of your blood sugar being too high or too low.  please keep a record of the readings and bring it to your next appointment here.  You can write it on any piece of paper.  please call us sooner if your blood sugar goes below 70, or if you have a lot of readings over 200.

## 2014-06-01 ENCOUNTER — Telehealth: Payer: Self-pay | Admitting: Endocrinology

## 2014-06-01 NOTE — Telephone Encounter (Signed)
Patient calling in reading  Sat Morn 160 7am Sat Morn 263 10:30am Sat  131 Lunch  Sat 80 3:30pm  Sat  254 Dinner [id number]  294 Bed  Sun 147 7:30am Sun 256 10:30am Sun 259 Lunch  Sun 260 Dinner Sun 285 Bed  Mon 222 8:00am [place] 249 11:10am   If Dr. Everardo All needs to leave a message he can on patients voicemail   Thank you :)

## 2014-06-01 NOTE — Telephone Encounter (Signed)
We can't increase this insulin.  It would help to change to "50/50," but that is more expensive.  Please let me know

## 2014-06-01 NOTE — Telephone Encounter (Signed)
Noted, pt states that he is going to stay on the 70/30 for another week. And let us know at the beginning of next week if he wants to proceed with the 70/30 or switch to the 50/50.

## 2014-06-01 NOTE — Telephone Encounter (Signed)
Requested call back to discuss.  

## 2014-06-01 NOTE — Telephone Encounter (Signed)
See below. Pt is taking Novolin  70/30 27 units with breakfast and 20 units with evening meal.  Please advise, Thanks!

## 2014-06-01 NOTE — Telephone Encounter (Signed)
Pt returning call will be at home for a little while

## 2014-06-09 ENCOUNTER — Telehealth (HOSPITAL_COMMUNITY): Payer: Self-pay | Admitting: *Deleted

## 2014-06-10 ENCOUNTER — Other Ambulatory Visit (HOSPITAL_COMMUNITY): Payer: Self-pay | Admitting: Cardiovascular Disease

## 2014-06-10 DIAGNOSIS — I25810 Atherosclerosis of coronary artery bypass graft(s) without angina pectoris: Secondary | ICD-10-CM

## 2014-06-11 DIAGNOSIS — E11359 Type 2 diabetes mellitus with proliferative diabetic retinopathy without macular edema: Secondary | ICD-10-CM | POA: Diagnosis not present

## 2014-06-11 DIAGNOSIS — Z961 Presence of intraocular lens: Secondary | ICD-10-CM | POA: Diagnosis not present

## 2014-06-11 DIAGNOSIS — E11311 Type 2 diabetes mellitus with unspecified diabetic retinopathy with macular edema: Secondary | ICD-10-CM | POA: Diagnosis not present

## 2014-06-15 ENCOUNTER — Encounter: Payer: Self-pay | Admitting: Podiatry

## 2014-06-15 ENCOUNTER — Ambulatory Visit (INDEPENDENT_AMBULATORY_CARE_PROVIDER_SITE_OTHER): Payer: Medicare Other | Admitting: Podiatry

## 2014-06-15 VITALS — BP 170/66 | HR 62 | Resp 15 | Ht 70.0 in | Wt 155.0 lb

## 2014-06-15 DIAGNOSIS — M79609 Pain in unspecified limb: Secondary | ICD-10-CM

## 2014-06-15 DIAGNOSIS — M79673 Pain in unspecified foot: Secondary | ICD-10-CM

## 2014-06-15 DIAGNOSIS — B351 Tinea unguium: Secondary | ICD-10-CM

## 2014-06-15 NOTE — Patient Instructions (Signed)
Followup with cardiologist for results of lower extremity arterial Doppler dated 04/29/2014

## 2014-06-16 NOTE — Progress Notes (Signed)
Patient ID: Henry LessGlenn T Baby Jr., male   DOB: [birth year], 73 y.o.   MRN: [id number]  Subjective: This diabetic patient presents for ongoing debridement of mycotic toenails He said a recent arterial Doppler examination which demonstrated abnormal findings. I referred him to Dr. York RamJonathan Barry for further evaluation. Stretch this cardiologist is Dr.Kelley in the same practice with Dr. Allyson SabalBerry and patient wanted to see Dr. Nicholaus BloomKelley for followup on the abnormal Doppler.  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 Nicholaus BloomKelley  Plan: Toenails 10 are debrided today without a bleeding  I advised patient to keep scheduled appointment with Dr. Nicholaus BloomKelley and allow him to evaluate the abnormal lower extremity arterial Doppler.  Patient given copy of arterial Doppler today  Reappoint at three-month intervals or sooner if patient has concern

## 2014-06-18 ENCOUNTER — Ambulatory Visit (HOSPITAL_COMMUNITY)
Admission: RE | Admit: 2014-06-18 | Discharge: 2014-06-18 | Disposition: A | Discharge status: Home / Self Care | Payer: Medicare Other | Source: Ambulatory Visit | Attending: Cardiology | Admitting: Cardiology

## 2014-06-18 DIAGNOSIS — I25810 Atherosclerosis of coronary artery bypass graft(s) without angina pectoris: Secondary | ICD-10-CM

## 2014-06-18 DIAGNOSIS — Z951 Presence of aortocoronary bypass graft: Secondary | ICD-10-CM | POA: Insufficient documentation

## 2014-06-18 DIAGNOSIS — I63239 Cerebral infarction due to unspecified occlusion or stenosis of unspecified carotid arteries: Secondary | ICD-10-CM | POA: Insufficient documentation

## 2014-06-18 DIAGNOSIS — I6529 Occlusion and stenosis of unspecified carotid artery: Secondary | ICD-10-CM | POA: Diagnosis not present

## 2014-06-18 DIAGNOSIS — I2510 Atherosclerotic heart disease of native coronary artery without angina pectoris: Secondary | ICD-10-CM | POA: Insufficient documentation

## 2014-06-18 NOTE — Progress Notes (Signed)
Carotid Duplex Completed. Rita Sturdivant, BS, RDMS, RVT  

## 2014-06-30 ENCOUNTER — Encounter: Payer: Self-pay | Admitting: *Deleted

## 2014-07-02 DIAGNOSIS — I10 Essential (primary) hypertension: Secondary | ICD-10-CM | POA: Diagnosis not present

## 2014-07-02 DIAGNOSIS — E119 Type 2 diabetes mellitus without complications: Secondary | ICD-10-CM | POA: Diagnosis not present

## 2014-07-02 DIAGNOSIS — N183 Chronic kidney disease, stage 3 unspecified: Secondary | ICD-10-CM | POA: Diagnosis not present

## 2014-07-02 DIAGNOSIS — N390 Urinary tract infection, site not specified: Secondary | ICD-10-CM | POA: Diagnosis not present

## 2014-08-17 ENCOUNTER — Ambulatory Visit: Payer: Medicare Other | Admitting: Podiatry

## 2014-08-19 ENCOUNTER — Ambulatory Visit (INDEPENDENT_AMBULATORY_CARE_PROVIDER_SITE_OTHER): Payer: Medicare Other | Admitting: Podiatry

## 2014-08-19 DIAGNOSIS — B351 Tinea unguium: Secondary | ICD-10-CM

## 2014-08-19 DIAGNOSIS — M79609 Pain in unspecified limb: Secondary | ICD-10-CM | POA: Diagnosis not present

## 2014-08-19 DIAGNOSIS — M79676 Pain in unspecified toe(s): Secondary | ICD-10-CM

## 2014-08-19 NOTE — Progress Notes (Signed)
Patient ID: Henry [name]., male   DOB: February 16, 1941, 73 y.o.   MRN: [id number]  Subjective: This diabetic H. presents for ongoing debridement mycotic toenails. He has had a lower extremity arterial Doppler that was abnormal and has a pending further evaluation by his cardiologist Dr. Tresa Endo in October 2015.  Objective: Elongated, incurvated, discolored toenails 6-10  Assessment: Symptomatic onychomycoses 6-10 Peripheral arterial disease with further evaluation pending  Plan: Nails x10 are debrided without any bleeding   Reappoint x3 months

## 2014-08-27 ENCOUNTER — Ambulatory Visit (INDEPENDENT_AMBULATORY_CARE_PROVIDER_SITE_OTHER): Payer: Medicare Other | Admitting: Endocrinology

## 2014-08-27 ENCOUNTER — Encounter: Payer: Self-pay | Admitting: Endocrinology

## 2014-08-27 VITALS — BP 140/70 | HR 66 | Temp 98.1°F | Ht 70.0 in | Wt 153.0 lb

## 2014-08-27 DIAGNOSIS — D649 Anemia, unspecified: Secondary | ICD-10-CM | POA: Diagnosis not present

## 2014-08-27 DIAGNOSIS — Z23 Encounter for immunization: Secondary | ICD-10-CM | POA: Diagnosis not present

## 2014-08-27 DIAGNOSIS — E1029 Type 1 diabetes mellitus with other diabetic kidney complication: Secondary | ICD-10-CM | POA: Diagnosis not present

## 2014-08-27 DIAGNOSIS — I2510 Atherosclerotic heart disease of native coronary artery without angina pectoris: Secondary | ICD-10-CM | POA: Diagnosis not present

## 2014-08-27 DIAGNOSIS — E1149 Type 2 diabetes mellitus with other diabetic neurological complication: Secondary | ICD-10-CM | POA: Diagnosis not present

## 2014-08-27 DIAGNOSIS — Z125 Encounter for screening for malignant neoplasm of prostate: Secondary | ICD-10-CM

## 2014-08-27 DIAGNOSIS — E1065 Type 1 diabetes mellitus with hyperglycemia: Principal | ICD-10-CM

## 2014-08-27 DIAGNOSIS — E1142 Type 2 diabetes mellitus with diabetic polyneuropathy: Secondary | ICD-10-CM

## 2014-08-27 LAB — CBC WITH DIFFERENTIAL/PLATELET
BASOS ABS: 0 10*3/uL (ref 0.0–0.1)
Basophils Relative: 0.6 % (ref 0.0–3.0)
EOS ABS: 0.3 10*3/uL (ref 0.0–0.7)
Eosinophils Relative: 3.9 % (ref 0.0–5.0)
HEMATOCRIT: 34.6 % — AB (ref 39.0–52.0)
HEMOGLOBIN: 11.4 g/dL — AB (ref 13.0–17.0)
LYMPHS PCT: 31.8 % (ref 12.0–46.0)
Lymphs Abs: 2.1 10*3/uL (ref 0.7–4.0)
MCHC: 32.9 g/dL (ref 30.0–36.0)
MCV: 91.4 fl (ref 78.0–100.0)
MONOS PCT: 8.9 % (ref 3.0–12.0)
Monocytes Absolute: 0.6 10*3/uL (ref 0.1–1.0)
NEUTROS PCT: 54.8 % (ref 43.0–77.0)
Neutro Abs: 3.6 10*3/uL (ref 1.4–7.7)
Platelets: 248 10*3/uL (ref 150.0–400.0)
RBC: 3.78 Mil/uL — AB (ref 4.22–5.81)
RDW: 14.6 % (ref 11.5–15.5)
WBC: 6.6 10*3/uL (ref 4.0–10.5)

## 2014-08-27 LAB — URINALYSIS, ROUTINE W REFLEX MICROSCOPIC
Bilirubin Urine: NEGATIVE
Hgb urine dipstick: NEGATIVE
Ketones, ur: NEGATIVE
Leukocytes, UA: NEGATIVE
NITRITE: NEGATIVE
PH: 6.5 (ref 5.0–8.0)
RBC / HPF: NONE SEEN (ref 0–?)
Specific Gravity, Urine: 1.01 (ref 1.000–1.030)
Total Protein, Urine: NEGATIVE
Urine Glucose: >=1000 — AB
Urobilinogen, UA: 0.2 (ref 0.0–1.0)
WBC, UA: NONE SEEN (ref 0–?)

## 2014-08-27 LAB — PSA, MEDICARE: PSA: 0.75 ng/ml (ref 0.10–4.00)

## 2014-08-27 LAB — IBC PANEL
IRON: 79 ug/dL (ref 42–165)
SATURATION RATIOS: 24.1 % (ref 20.0–50.0)
TRANSFERRIN: 234 mg/dL (ref 212.0–360.0)

## 2014-08-27 LAB — MICROALBUMIN / CREATININE URINE RATIO
Creatinine,U: 59.6 mg/dL
MICROALB/CREAT RATIO: 19.8 mg/g (ref 0.0–30.0)
Microalb, Ur: 11.8 mg/dL — ABNORMAL HIGH (ref 0.0–1.9)

## 2014-08-27 LAB — HEMOGLOBIN A1C: Hgb A1c MFr Bld: 10 % — ABNORMAL HIGH (ref 4.6–6.5)

## 2014-08-27 NOTE — Patient Instructions (Signed)
please consider these measures for your health:  minimize alcohol.  do not use tobacco products.  have a colonoscopy at least every 10 years from age 73.  keep firearms safely stored.  always use seat belts.  have working smoke alarms in your home.  see an eye doctor and dentist regularly.  never drive under the influence of alcohol or drugs (including prescription drugs).  those with fair skin should take precautions against the sun. it is critically important to prevent falling down (keep floor areas well-lit, dry, and free of loose objects.  If you have a cane, walker, or wheelchair, you should use it, even for short trips around the house.  Also, try not to rush) blood tests are being requested for you today.  We'll contact you with results. good diet and exercise habits significanly improve the control of your diabetes.  please let me know if you wish to be referred to a dietician.  high blood sugar is very risky to your health.  you should see an eye doctor and dentist every year.  You are at higher than average risk for pneumonia and hepatitis-B.  You should be vaccinated against both.   Please come back for a follow-up appointment in 3 months

## 2014-08-27 NOTE — Progress Notes (Signed)
Subjective:    Patient ID: Henry Less., male    DOB: 1941-08-28, 73 y.o.   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  he likes the new bid insulin schedule.  he brings a record of his cbg's which i have reviewed today.  It varies from 90-200's.  It is in general higher as the day goes on.   Dyslipidemia: he denies chest pain Anemia: he denies BRBPR.  Past Medical History  Diagnosis Date  . ANEMIA-NOS 07/10/2007  . CORONARY ARTERY DISEASE 07/10/2007  . DIABETES MELLITUS, TYPE I 07/10/2007  . HYPERLIPIDEMIA 07/10/2007  . HYPERTENSION 07/10/2007  . RENAL INSUFFICIENCY 07/10/2007  . 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   Marland Kitchen Appendectomy    . Carotid endarterectomy Right     right  . Cardiac catheterization      stenting in the vein graft to the right coronary artery and had_DES 3x 26 mm Integrity stent inserted, post dilated to 3.4 mm    History   Social History  . Marital Status: Married    Spouse Name: N/A    Number of Children: N/A  . Years of Education: N/A   Occupational History  . Not on file.   Social History Main Topics  . Smoking status: Former Games developer  . Smokeless tobacco: Never Used  . Alcohol Use: No  . Drug Use: No  . Sexual Activity: Not on file   Other Topics Concern  . Not on file   Social History Narrative   Says his diet and exercise are very good    Current Outpatient Prescriptions on File Prior to Visit    Medication Sig Dispense Refill  . aspirin 81 MG tablet Take 81 mg by mouth daily.        . B Complex-C-Folic Acid (FOLBEE PLUS) TABS Take 1 tablet by mouth daily.        . Cholecalciferol (VITAMIN D) 2000 UNITS CAPS Take 1 capsule by mouth daily.       . hydrochlorothiazide 25 MG tablet Take 12.5 mg by mouth. Every other day      . insulin NPH-regular Human (NOVOLIN 70/30) (70-30) 100 UNIT/ML injection 27 units with breakfast, and 20 units with the evening meal.  20 mL  11  . isosorbide mononitrate (IMDUR) 60 MG 24 hr tablet TAKE 1 TABLET BY MOUTH ONCE DAILY  30 tablet  7  . labetalol (NORMODYNE) 200 MG tablet 1/2 in the morning 1/2 in the evening      . omeprazole (PRILOSEC) 20 MG capsule Take 20 mg by mouth daily.        Marland Kitchen ezetimibe (ZETIA) 10 MG tablet Take 1 tablet (10 mg total) by mouth daily.  42 tablet  0   No current facility-administered medications on file prior to visit.    Allergies  Allergen Reactions  . Amlodipine Besylate   . Codeine     anxiety  . Lipitor [Atorvastatin Calcium]  myalgias  . Penicillins   . Valsartan     Family History  Problem Relation Age of Onset  . Cancer Neg Hx   . Heart disease Mother     BP 140/70  Pulse 66  Temp(Src) 98.1 F (36.7 C) (Oral)  Ht  (1.778 m)  Wt 153 lb (69.4 kg)  BMI 21.95 kg/m2  SpO2 97%    Review of Systems He denies LOC and weight change    Objective:   Physical Exam VITAL SIGNS:  See vs page GENERAL: no distress Pulses: dorsalis pedis intact bilat.  Feet: no deformity. no ulcer on the feet. feet are of normal color and temp. Trace bilat leg edema, and patchy hyperpigmentation. There is bilateral onychomycosis. There is an old healed vein harvest scar at the left leg.  Neuro: sensation is intact to touch on the feet.     Lab Results  Component Value Date   WBC 6.6 08/27/2014   HGB 11.4* 08/27/2014   HCT 34.6* 08/27/2014   PLT 248.0 08/27/2014   GLUCOSE 313* 02/04/2014   CHOL 156 02/04/2014   TRIG  99 02/04/2014   HDL 44 02/04/2014   LDLCALC 92 02/04/2014   ALT 17 02/04/2014   AST 20 02/04/2014   NA 137 02/04/2014   K 4.6 02/04/2014   CL 103 02/04/2014   CREATININE 1.35 02/04/2014   BUN 33* 02/04/2014   CO2 27 02/04/2014   TSH 2.726 02/04/2014   PSA 0.75 08/27/2014   INR 1.16 02/28/2010   HGBA1C 10.0* 08/27/2014   MICROALBUR 11.8* 08/27/2014      Assessment & Plan:  DM: severe exacerbation: despite this, he should continue the same insulin, as he did not do well on multiple daily injections Anemia, slightly worse Dyslipidemia: ok control    Subjective:   Patient here for Medicare annual wellness visit and management of other chronic and acute problems.     Risk factors: advanced age    Roster of Physicians Providing Medical Care to Patient:  See "snapshot"   Activities of Daily Living: In your present state of health, do you have any difficulty performing the following activities?:  Preparing food and eating?: No  Bathing yourself: No  Getting dressed: No  Using the toilet:No  Moving around from place to place: No  In the past year have you fallen or had a near fall?: No    Home Safety: Has smoke detector and wears seat belts. No firearms. No excess sun exposure.  Diet and Exercise  Current exercise habits: pt says good Dietary issues discussed: pt reports healthy diet   Depression Screen  Q1: Over the past two weeks, have you felt down, depressed or hopeless? no  Q2: Over the past two weeks, have you felt little interest or pleasure in doing things? no   The following portions of the patient's history were reviewed and updated as appropriate: allergies, current medications, past family history, past medical history, past social history, past surgical history and problem list.  Past Medical History  Diagnosis Date  . ANEMIA-NOS 07/10/2007  . CORONARY ARTERY DISEASE 07/10/2007  . DIABETES MELLITUS, TYPE I 07/10/2007  . HYPERLIPIDEMIA 07/10/2007  . HYPERTENSION 07/10/2007  .  RENAL INSUFFICIENCY 07/10/2007  . 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   Marland Kitchen Appendectomy    . Carotid endarterectomy Right  right  . Cardiac catheterization      stenting in the vein graft to the right coronary artery and had_DES 3x 26 mm Integrity stent inserted, post dilated to 3.4 mm    History   Social History  . Marital Status: Married    Spouse Name: N/A    Number of Children: N/A  . Years of Education: N/A   Occupational History  . Not on file.   Social History Main Topics  . Smoking status: Former Games developer  . Smokeless tobacco: Never Used  . Alcohol Use: No  . Drug Use: No  . Sexual Activity: Not on file   Other Topics Concern  . Not on file   Social History Narrative   Says his diet and exercise are very good    Current Outpatient Prescriptions on File Prior to Visit  Medication Sig Dispense Refill  . aspirin 81 MG tablet Take 81 mg by mouth daily.        . B Complex-C-Folic Acid (FOLBEE PLUS) TABS Take 1 tablet by mouth daily.        . Cholecalciferol (VITAMIN D) 2000 UNITS CAPS Take 1 capsule by mouth daily.       . hydrochlorothiazide 25 MG tablet Take 12.5 mg by mouth. Every other day      . insulin NPH-regular Human (NOVOLIN 70/30) (70-30) 100 UNIT/ML injection 27 units with breakfast, and 20 units with the evening meal.  20 mL  11  . isosorbide mononitrate (IMDUR) 60 MG 24 hr tablet TAKE 1 TABLET BY MOUTH ONCE DAILY  30 tablet  7  . labetalol (NORMODYNE) 200 MG tablet 1/2 in the morning 1/2 in the evening      . omeprazole (PRILOSEC) 20 MG capsule Take 20 mg by mouth daily.        Marland Kitchen ezetimibe (ZETIA) 10 MG tablet Take 1 tablet (10 mg total) by mouth daily.  42 tablet  0   No current facility-administered medications on file prior to visit.    Allergies  Allergen Reactions  . Amlodipine Besylate   .  Codeine     anxiety  . Lipitor [Atorvastatin Calcium]     myalgias  . Penicillins   . Valsartan     Family History  Problem Relation Age of Onset  . Cancer Neg Hx   . Heart disease Mother     BP 140/70  Pulse 66  Temp(Src) 98.1 F (36.7 C) (Oral)  Ht  (1.778 m)  Wt 153 lb (69.4 kg)  BMI 21.95 kg/m2  SpO2 97%   Review of Systems  Denies hearing loss, and visual loss Objective:   Vision:  Sees opthalmologist Hearing: grossly normal Body mass index:  See vs page Msk: pt easily and quickly performs "get-up-and-go" from a sitting position Cognitive Impairment Assessment: cognition, memory and judgment appear normal.  remembers 3/3 at 5 minutes.  excellent recall.  can easily read and write a sentence.  alert and oriented x 3.     Assessment:   Medicare wellness utd on preventive parameters    Plan:   During the course of the visit the patient was educated and counseled about appropriate screening and preventive services including:        Fall prevention    Diabetes screening  Nutrition counseling   Vaccines / LABS Zostavax / Pneumococcal Vaccine  today  PSA  Patient Instructions (the written plan) was given to the patient.   we discussed code status.  pt requests  full code, but would not want to be started or maintained on artificial life-support measures if there was not a reasonable chance of recovery  Patient is advised the following: Patient Instructions  please consider these measures for your health:  minimize alcohol.  do not use tobacco products.  have a colonoscopy at least every 10 years from age 31.  keep firearms safely stored.  always use seat belts.  have working smoke alarms in your home.  see an eye doctor and dentist regularly.  never drive under the influence of alcohol or drugs (including prescription drugs).  those with fair skin should take precautions against the sun. it is critically important to prevent falling down (keep floor areas  well-lit, dry, and free of loose objects.  If you have a cane, walker, or wheelchair, you should use it, even for short trips around the house.  Also, try not to rush) blood tests are being requested for you today.  We'll contact you with results. good diet and exercise habits significanly improve the control of your diabetes.  please let me know if you wish to be referred to a dietician.  high blood sugar is very risky to your health.  you should see an eye doctor and dentist every year.  You are at higher than average risk for pneumonia and hepatitis-B.  You should be vaccinated against both.   Please come back for a follow-up appointment in 3 months

## 2014-09-18 ENCOUNTER — Other Ambulatory Visit: Payer: Self-pay | Admitting: Cardiovascular Disease

## 2014-09-18 NOTE — Telephone Encounter (Signed)
Rx was sent to pharmacy electronically. 

## 2014-09-24 ENCOUNTER — Ambulatory Visit (INDEPENDENT_AMBULATORY_CARE_PROVIDER_SITE_OTHER): Payer: Medicare Other | Admitting: Cardiovascular Disease

## 2014-09-24 ENCOUNTER — Encounter: Payer: Self-pay | Admitting: Cardiovascular Disease

## 2014-09-24 VITALS — BP 152/80 | HR 63 | Ht 70.0 in | Wt 152.5 lb

## 2014-09-24 DIAGNOSIS — I25119 Atherosclerotic heart disease of native coronary artery with unspecified angina pectoris: Secondary | ICD-10-CM

## 2014-09-24 DIAGNOSIS — E1342 Other specified diabetes mellitus with diabetic polyneuropathy: Secondary | ICD-10-CM

## 2014-09-24 DIAGNOSIS — I209 Angina pectoris, unspecified: Secondary | ICD-10-CM

## 2014-09-24 DIAGNOSIS — I739 Peripheral vascular disease, unspecified: Secondary | ICD-10-CM

## 2014-09-24 DIAGNOSIS — I2510 Atherosclerotic heart disease of native coronary artery without angina pectoris: Secondary | ICD-10-CM | POA: Diagnosis not present

## 2014-09-24 DIAGNOSIS — I10 Essential (primary) hypertension: Secondary | ICD-10-CM

## 2014-09-24 DIAGNOSIS — E1065 Type 1 diabetes mellitus with hyperglycemia: Secondary | ICD-10-CM

## 2014-09-24 DIAGNOSIS — IMO0002 Reserved for concepts with insufficient information to code with codable children: Secondary | ICD-10-CM

## 2014-09-24 DIAGNOSIS — E1029 Type 1 diabetes mellitus with other diabetic kidney complication: Secondary | ICD-10-CM | POA: Diagnosis not present

## 2014-09-24 DIAGNOSIS — E1142 Type 2 diabetes mellitus with diabetic polyneuropathy: Secondary | ICD-10-CM

## 2014-09-24 DIAGNOSIS — G629 Polyneuropathy, unspecified: Secondary | ICD-10-CM

## 2014-09-24 MED ORDER — HYDRALAZINE HCL 25 MG PO TABS: ORAL_TABLET | ORAL | Status: DC

## 2014-09-24 NOTE — Progress Notes (Signed)
Patient ID: Henry [name]., male   DOB: [birth year], 73 y.o.   MRN: [id number]      HPI: Henry [name]. is a 73 y.o. male who presents to the office today for 26-monthcardiology evaluation.  [name] a 89year WM with a history of brittle type I diabetes. He has established coronary artery disease and in April 1999 suffered an inferior wall myocardial infarction which initially was complicated by third-degree heart block in the setting of his MI. He underwent PTCA of his RCA. Due to the extensive CAD he ultimately underwent elective CABG surgery. He also has peripheral vascular disease and is status post right carotid endarterectomy. At catheterization March 2011 he was [name] to have 95% stenosis in the graft supplying his RCA and underwent successful stenting with a 3.0x26 mm integrity stent. A bare metal stent was used in light of his significant diabetic retinopathy with significant bleeding history to reduce his need for long-term dual antiplatelet therapy. [name] renal insufficiency and is followed by [name] in 2013 showed a creatinine of 1.32.   [name] experienced several episodes of dizziness. He felt possibly to have an autonomic neuropathy. He denies frank syncope. Denies recurrent episodes of chest pain. He denies significant shortness of breath.  He tells me recently, his sugars have been very difficult to control. Recent hemoglobin [name] checked by Dr. ELoanne Drillingwas elevated at 11.4.[name] to develop the beginning of right foot paresthesias but most likely her peripheral neuropathy.  When I last saw [name] I recommended a titration of his labetolol for suboptimal blood pressure control, as well as ectopy.  He did have occasional PVCs, predominately unifocal.  However, he did have several episodes of 4 beats of ventricular tachycardia at a rate of 114 and then another episode of 7 beats of nonsustained ventricular tachycardia.   Since I  last saw him, apparently, had developed some dizziness and Dr. MDub Amishas significantly reduced.  His labetalol to just 100 mg twice a day.  He also takes hydrochlorothiazide 12.5 mg on days that he is not exercising outside.  He also is on isosorbide mononitrate 60 mg.  Remotely, he had developed significant leg edema with amlodipine.  He was unable to tolerate ACE inhibition or ARB therapy.  There also was a possibility of hydralazine-induced leg swelling back in 2011.  He tells me Dr. ELoanne Drillingrecently changed his insulin to Novolin 70/31.  He takes 27 units with breakfast and 26 units in the evening meal.  He feels improved with this new regimen.  Henry [name] was rechecked and this did improve, but is still elevated at 10.0.   [name]  Blood pressure recordings at home.  These typically seem to run in the 125-146 range.  He presents for evaluation.  Past Medical History  Diagnosis Date  . ANEMIA-NOS 07/10/2007  . CORONARY ARTERY DISEASE 07/10/2007  . DIABETES MELLITUS, TYPE I 07/10/2007  . HYPERLIPIDEMIA 07/10/2007  . HYPERTENSION 07/10/2007  . RENAL INSUFFICIENCY 07/10/2007  . 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   .Marland KitchenAppendectomy    . Carotid endarterectomy Right     right  . Cardiac catheterization      stenting in the vein graft to the right coronary artery and had_DES 3x 26 mm Integrity  stent inserted, post dilated to 3.4 mm    Allergies  Allergen Reactions  . Amlodipine Besylate   . Codeine     anxiety  . Lipitor [Atorvastatin Calcium]     myalgias  . Penicillins   . Valsartan     Current Outpatient Prescriptions  Medication Sig Dispense Refill  . aspirin 81 MG tablet Take 81 mg by mouth daily.        . B Complex-C-Folic Acid (FOLBEE PLUS) TABS Take 1 tablet by mouth daily.        . Cholecalciferol (VITAMIN D)  2000 UNITS CAPS Take 1 capsule by mouth daily.       . hydrochlorothiazide 25 MG tablet Take 12.5 mg by mouth. Every other day      . insulin NPH-regular Human (NOVOLIN 70/30) (70-30) 100 UNIT/ML injection 27 units with breakfast, and 20 units with the evening meal.  20 mL  11  . isosorbide mononitrate (IMDUR) 60 MG 24 hr tablet TAKE 1 TABLET BY MOUTH EVERY DAY  30 tablet  7  . labetalol (NORMODYNE) 200 MG tablet 1/2 in the morning 1/2 in the evening      . omeprazole (PRILOSEC) 20 MG capsule Take 20 mg by mouth daily.        . hydrALAZINE (APRESOLINE) 25 MG tablet Take 1/2 tablet daily for 1 week then take 1/2 tablet twice daily  30 tablet  3   No current facility-administered medications for this visit.    Socially he is married has 2 children and 2 grandchildren. No tobacco history or alcohol use. He is not routinely exercise.   ROS General: Negative; No fevers, chills, or night sweats;  HEENT: He is status post multiple laser procedures for diabetic retinopathy., sinus congestion, difficulty swallowing Pulmonary: Negative; No cough, wheezing, shortness of breath, hemoptysis Cardiovascular: Negative; No chest pain, presyncope, syncope, palpitations Positive for claudication symptoms around his ankles GI: Negative; No nausea, vomiting, diarrhea, or abdominal pain GU: Positive for mild renal insufficiency; No dysuria, hematuria, or difficulty voiding Musculoskeletal: Negative; no myalgias, joint pain, or weakness Hematologic/Oncology: Negative; no easy bruising, bleeding Endocrine: Positive for brittle diabetes no heat/cold intolerance;  Neuro: Positive for neuropathy with paresthesias; no changes in balance, headaches Skin: Negative; No rashes or skin lesions Psychiatric: Negative; No behavioral problems, depression Sleep: Negative; No snoring, daytime sleepiness, hypersomnolence, bruxism, restless legs, hypnogognic hallucinations, no cataplexy Other comprehensive 14 point system  review is negative.   PE BP 152/80  Pulse 63  Ht _0  (1.778 m)  Wt 152 lb 8 oz (69.174 kg)  BMI 21.88 kg/m2   Repeat BP by me was 180/80 supine, and 180/78 standing General: Alert, oriented, no distress.  Skin: normal turgor, no rashes HEENT: Normocephalic, atraumatic. Pupils round and reactive; sclera anicteric;no lid lag.  Nose without nasal septal hypertrophy no xanthelasmas Mouth/Parynx benign; Mallinpatti scale 2 Neck: No JVD; he is status post right carotid endarterectomy. He does have  bilateral bruits left greater than right. Lungs: clear to ausculatation and percussion; no wheezing or rales Chest wall: Nontender to palpation Heart: Frequent ectopy on auscultation., s1 s2 normal 2/6 systolic murmur with a diastolic murmur. Abdomen: soft, nontender; no hepatosplenomehaly, BS+; abdominal aorta nontender and not dilated by palpation. No renal artery bruits. Pulses 2+ Extremities: no clubbing cyanosis or edema, Homan's sign negative  Neurologic: grossly nonfocal Psychological: Normal affect and mood  ECG (independently read by me): Normal sinus rhythm at 63 beats per minute.  Nonspecific ST changes.  Prior  ECG (independently read by me): Sinus rhythm at 59 beats per minute.  Nonspecific ST changes.  Prior 02/04/2014 ECG  (independently read by me): Sinus rhythm at 68 beats per minute with frequent PVCs unifocal.  Prior ECG of July 2014: Sinus rhythm at 64 beats per minute. QTc interval 400 ms. One isolated PAC. Small nondiagnostic Q waves inferiorly with preserved R waves.  LABS:  BMET    Component Value Date/Time   NA 137 02/04/2014 0808   K 4.6 02/04/2014 0808   CL 103 02/04/2014 0808   CO2 27 02/04/2014 0808   GLUCOSE 313* 02/04/2014 0808   BUN 33* 02/04/2014 0808   CREATININE 1.35 02/04/2014 0808   CREATININE 1.13 04/06/2010   CALCIUM 9.9 02/04/2014 0808   GFRNONAA >60 04/06/2010   GFRAA  Value: 59        The eGFR has been calculated using the MDRD equation. This  calculation has not been validated in all clinical situations. eGFR's persistently <60 mL/min signify possible Chronic Kidney Disease.* 03/18/2010 0328     Hepatic Function Panel     Component Value Date/Time   PROT 6.9 02/04/2014 0808   ALBUMIN 4.1 02/04/2014 0808   AST 20 02/04/2014 0808   ALT 17 02/04/2014 0808   ALKPHOS 78 02/04/2014 0808   BILITOT 0.4 02/04/2014 0808   BILIDIR 0.1 06/13/2013 0840     CBC    Component Value Date/Time   WBC 6.6 08/27/2014 0822   RBC 3.78* 08/27/2014 0822   HGB 11.4* 08/27/2014 0822   HCT 34.6* 08/27/2014 0822   PLT 248.0 08/27/2014 0822   MCV 91.4 08/27/2014 0822   MCH 30.2 02/04/2014 0808   MCHC 32.9 08/27/2014 0822   RDW 14.6 08/27/2014 0822   LYMPHSABS 2.1 08/27/2014 0822   MONOABS 0.6 08/27/2014 0822   EOSABS 0.3 08/27/2014 0822   BASOSABS 0.0 08/27/2014 0822     BNP No results [name] for this basename: probnp    Lipid Panel     Component Value Date/Time   CHOL 156 02/04/2014 0808   TRIG 99 02/04/2014 0808   HDL 44 02/04/2014 0808   CHOLHDL 3.5 02/04/2014 0808   VLDL 20 02/04/2014 0808   LDLCALC 92 02/04/2014 0808     RADIOLOGY: No results [name].    ASSESSMENT AND PLAN: Mr. Tellefsen  Is 16 years following his inferior wall myocardial infarction treated successfully with PTCA of his right coronary artery with resultant significant myocardial salvage. Due to concomitant CAD he underwent CABG surgery with a LIMA to his LAD, vein to diagonal, sequential vein to OM1 and distal circumflex, and SVG to his right coronary artery. He status post stenting of the proximal portion of the vein graft to his right coronary artery in 2011. His last nuclear perfusion study was in November 2012 remained low risk and history minimal region of inferior scar. His last carotid studies were June 2013 which showed mild plaque without significant stenoses.  His last echo Doppler study in February 2014 showed an ejection fraction of 50-55%; mitral annular calcification with mild MR. His  right ventricle was dilated. He did have mild TR and mild MR. 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 when I last saw Mr. Folkert continues to have fairly asymptomatic ectopy and recommended further titration of his labetalol to 300 mg in the morning and 250 mg in the evening.  Apparently this has been significantly reduced to 100 mg twice a day by his nephrologist.  His  blood pressure today continues to be labile on this regimen of 100 mg twice a day.  However, he is in sinus rhythm with rate of 63 beats per minute.  He has been taking HCTZ 12.5 mg on days that he is not active.  On attempting to retry hydralazine, and we'll start him at 12.5 mg to make sure he has not developed significant swelling initially, a once a day, and after one week to increase this to twice a day.  He will be seeing Dr. Dimas [name] in 2-3 weeks at which time followup blood work will be obtained. I also reviewed with him at a lower she may Doppler study, which had been scheduled by Dr. Amalia [name].  This demonstrated moderate to severe arterial insufficiency.  There was evidence for 50-69% diameter reduction in the left mid SFA.  The posterior tibial arteries appear to demonstrate occlusive disease bilaterally.  I will arrange an ED evaluation with Henry [name].  I will see Mr. Sesay in 4 months for reevaluation or sooner if problems arise. Troy Sine, MD, [facility]  09/24/2014 6:32 PM

## 2014-09-24 NOTE — Patient Instructions (Signed)
You [facility].  Your physician has recommended you make the following change in your medication: start new prescription for Hydralazine. Take as directed on the bottle. This has already been sent to the pharmacy.  Your physician recommends that you schedule a follow-up appointment in: 4 months with Dr. Tresa EndoKelly.

## 2014-09-28 NOTE — Addendum Note (Signed)
Addended byGaynelle Cage: WADDELL, WANDA M. on: 09/28/2014 09:55 AM   Modules accepted: Orders

## 2014-09-29 ENCOUNTER — Ambulatory Visit: Payer: Medicare Other | Admitting: Cardiovascular Disease

## 2014-10-02 DIAGNOSIS — I10 Essential (primary) hypertension: Secondary | ICD-10-CM | POA: Diagnosis not present

## 2014-10-02 DIAGNOSIS — N183 Chronic kidney disease, stage 3 (moderate): Secondary | ICD-10-CM | POA: Diagnosis not present

## 2014-10-12 DIAGNOSIS — N390 Urinary tract infection, site not specified: Secondary | ICD-10-CM | POA: Diagnosis not present

## 2014-10-12 DIAGNOSIS — I10 Essential (primary) hypertension: Secondary | ICD-10-CM | POA: Diagnosis not present

## 2014-10-12 DIAGNOSIS — N183 Chronic kidney disease, stage 3 (moderate): Secondary | ICD-10-CM | POA: Diagnosis not present

## 2014-10-21 ENCOUNTER — Ambulatory Visit (INDEPENDENT_AMBULATORY_CARE_PROVIDER_SITE_OTHER): Payer: Medicare Other | Admitting: Podiatry

## 2014-10-21 ENCOUNTER — Encounter: Payer: Self-pay | Admitting: Podiatry

## 2014-10-21 VITALS — BP 164/78 | HR 64 | Resp 12

## 2014-10-21 DIAGNOSIS — B351 Tinea unguium: Secondary | ICD-10-CM | POA: Diagnosis not present

## 2014-10-21 DIAGNOSIS — M79676 Pain in unspecified toe(s): Secondary | ICD-10-CM

## 2014-10-21 NOTE — Progress Notes (Signed)
Patient ID: Henry LessGlenn T Doublin Jr., male   DOB: 03-05-1941, 73 y.o.   MRN: [id number]  Subjective: This patient presents for ongoing debridement of painful mycotic toenails. He has a pending follow-up vascular consultation with Dr. Nanetta BattyJonathan Berry in November 2015.  Objective: The toenails are elongated, incurvated, discolored 6-10  Assessment symptomatic onychomycosis 6-10 Peripheral arterial disease with pending evaluation  Plan: Nails 10 are debrided without any bleeding  Reappoint x 3 months

## 2014-11-04 ENCOUNTER — Encounter: Payer: Self-pay | Admitting: Cardiovascular Disease

## 2014-11-04 ENCOUNTER — Ambulatory Visit (INDEPENDENT_AMBULATORY_CARE_PROVIDER_SITE_OTHER): Payer: Medicare Other | Admitting: Cardiovascular Disease

## 2014-11-04 VITALS — BP 188/70 | HR 68 | Ht 70.0 in | Wt 157.4 lb

## 2014-11-04 DIAGNOSIS — I739 Peripheral vascular disease, unspecified: Secondary | ICD-10-CM | POA: Diagnosis not present

## 2014-11-04 DIAGNOSIS — I2510 Atherosclerotic heart disease of native coronary artery without angina pectoris: Secondary | ICD-10-CM | POA: Diagnosis not present

## 2014-11-04 NOTE — Progress Notes (Signed)
11/04/2014 Henry LessGlenn T Sunga Jr.   March 26, 1941  [id number]  Primary Physician Henry BellingELLISON, SEAN, MD Primary Cardiologist: Henry GessJonathan J. Wanette Robison MD Henry [name]   HPI:  Mr. Henry [name] is a delightful 73 year old thin-appearing Caucasian male patient of Dr. Devona Konigom [name]'s and Dr. Theotis [name]'s. He has a history of hypertension, hyperlipidemia and diabetes. He has coronary artery disease status post remote myocardial infarction in 1999 with stenting of his RCA and subsequent coronary artery bypass grafting. He is likewise had right carotid endarterectomy in the past. He was referred because of poorly palpable pedal pulses though he really denies significant claudication and does not have any evidence of open wounds. Lower extremity arterial Doppler studies performed at our office 04/29/14 revealed tibial vessel disease on the right with only a patent peroneal artery and moderate SFA disease on the left with an occluded posterior tibial.   Current Outpatient Prescriptions  Medication Sig Dispense Refill  . aspirin 81 MG tablet Take 81 mg by mouth daily.      . B Complex-C-Folic Acid (FOLBEE PLUS) TABS Take 1 tablet by mouth daily.      . Cholecalciferol (VITAMIN D) 2000 UNITS CAPS Take 1 capsule by mouth daily.     . hydrochlorothiazide 25 MG tablet Take 25 mg by mouth every other day.     . insulin NPH-regular Human (NOVOLIN 70/30) (70-30) 100 UNIT/ML injection 27 units with breakfast, and 20 units with the evening meal. 20 mL 11  . isosorbide mononitrate (IMDUR) 60 MG 24 hr tablet TAKE 1 TABLET BY MOUTH EVERY DAY 30 tablet 7  . labetalol (NORMODYNE) 200 MG tablet Take 100 mg by mouth 3 (three) times daily.     Marland Kitchen. omeprazole (PRILOSEC) 20 MG capsule Take 20 mg by mouth daily.       No current facility-administered medications for this visit.    Allergies  Allergen Reactions  . Amlodipine Besylate   . Codeine     anxiety  . Lipitor [Atorvastatin Calcium]     myalgias  . Penicillins   . Valsartan       History   Social History  . Marital Status: Married    Spouse Name: N/A    Number of Children: N/A  . Years of Education: N/A   Occupational History  . Not on file.   Social History Main Topics  . Smoking status: Former Games developermoker  . Smokeless tobacco: Never Used  . Alcohol Use: No  . Drug Use: No  . Sexual Activity: Not on file   Other Topics Concern  . Not on file   Social History Narrative   Says his diet and exercise are very good     Review of Systems: General: negative for chills, fever, night sweats or weight changes.  Cardiovascular: negative for chest pain, dyspnea on exertion, edema, orthopnea, palpitations, paroxysmal nocturnal dyspnea or shortness of breath Dermatological: negative for rash Respiratory: negative for cough or wheezing Urologic: negative for hematuria Abdominal: negative for nausea, vomiting, diarrhea, bright red blood per rectum, melena, or hematemesis Neurologic: negative for visual changes, syncope, or dizziness All other systems reviewed and are otherwise negative except as noted above.    Blood pressure 188/70, pulse 68, height 5\' 10"  (1.778 m), weight 157 lb 6.4 oz (71.396 kg).  General appearance: alert and no distress Neck: no adenopathy, no JVD, supple, symmetrical, trachea midline, thyroid not enlarged, symmetric, no tenderness/mass/nodules and soft bilateral carotid bruits Lungs: clear to auscultation bilaterally Heart: regular rate and rhythm, S1,  S2 normal, no murmur, click, rub or gallop Extremities: extremities normal, atraumatic, no cyanosis or edema and absent right pedal pulses, 1+ left dorsalis pedis  EKG not performed today  ASSESSMENT AND PLAN:   Peripheral arterial disease The patient was referred by Dr. Tresa EndoKelly and Dr. Leeanne Deeduchman for peripheral vascular evaluation. He has a long history of coronary artery disease as well as multiple risk factors including treated diabetes, hypertension and hyperlipidemia. Dr. Leeanne Deeduchman  apparently was unable to feel pulses on exam and arterial Doppler studies performed in our office 04/29/14 revealed occluded anterior tibial and posterior tibial arteries on the right, moderately high-grade left SFA stenosis with occluded left posterior tibial artery. He has mild claudication which is not lifestyle limiting. There are no open wounds suggestive of critical limb ischemia. At this point, I do not feel any further evaluation with regard to intervention is warranted. We'll continue to follow him noninvasively on an annual basis. I will see him back when necessary.      Henry GessJonathan J. Daviyon Widmayer MD FACP,FACC,FAHA, [facility] 11/04/2014 12:51 PM

## 2014-11-04 NOTE — Patient Instructions (Signed)
Your physician has requested that you have a lower arterial duplex. This test is an ultrasound of the arteries in the legs. It looks at arterial blood flow in the legs. Allow one hour for Lower Arterial scans. There are no restrictions or special instructions.  Dr Allyson SabalBerry recommends that you follow-up with him on an as needed basis.

## 2014-11-04 NOTE — Assessment & Plan Note (Signed)
The patient was referred by Dr. Tresa EndoKelly and Dr. Leeanne Deeduchman for peripheral vascular evaluation. He has a long history of coronary artery disease as well as multiple risk factors including treated diabetes, hypertension and hyperlipidemia. Dr. Leeanne Deeduchman apparently was unable to feel pulses on exam and arterial Doppler studies performed in our office 04/29/14 revealed occluded anterior tibial and posterior tibial arteries on the right, moderately high-grade left SFA stenosis with occluded left posterior tibial artery. He has mild claudication which is not lifestyle limiting. There are no open wounds suggestive of critical limb ischemia. At this point, I do not feel any further evaluation with regard to intervention is warranted. We'll continue to follow him noninvasively on an annual basis. I will see him back when necessary.

## 2014-11-09 DIAGNOSIS — N390 Urinary tract infection, site not specified: Secondary | ICD-10-CM | POA: Diagnosis not present

## 2014-11-09 DIAGNOSIS — N183 Chronic kidney disease, stage 3 (moderate): Secondary | ICD-10-CM | POA: Diagnosis not present

## 2014-11-09 DIAGNOSIS — I10 Essential (primary) hypertension: Secondary | ICD-10-CM | POA: Diagnosis not present

## 2014-11-26 ENCOUNTER — Ambulatory Visit (INDEPENDENT_AMBULATORY_CARE_PROVIDER_SITE_OTHER): Payer: Medicare Other | Admitting: Endocrinology

## 2014-11-26 ENCOUNTER — Encounter: Payer: Self-pay | Admitting: Endocrinology

## 2014-11-26 VITALS — BP 148/52 | HR 76 | Wt 155.0 lb

## 2014-11-26 DIAGNOSIS — E1065 Type 1 diabetes mellitus with hyperglycemia: Principal | ICD-10-CM

## 2014-11-26 DIAGNOSIS — E1029 Type 1 diabetes mellitus with other diabetic kidney complication: Secondary | ICD-10-CM

## 2014-11-26 DIAGNOSIS — I2510 Atherosclerotic heart disease of native coronary artery without angina pectoris: Secondary | ICD-10-CM | POA: Diagnosis not present

## 2014-11-26 DIAGNOSIS — IMO0002 Reserved for concepts with insufficient information to code with codable children: Secondary | ICD-10-CM

## 2014-11-26 LAB — HEMOGLOBIN A1C: HEMOGLOBIN A1C: 10.4 % — AB (ref 4.6–6.5)

## 2014-11-26 MED ORDER — INSULIN NPH ISOPHANE & REGULAR (70-30) 100 UNIT/ML ~~LOC~~ SUSP: SUBCUTANEOUS | Status: DC

## 2014-11-26 NOTE — Patient Instructions (Addendum)
The best way to keep your blood sugar is a good range is to use an insulin pump.  Please let me know if you want to go ahead with that.   blood tests are being requested for you today.  We'll contact you with results.   Please come back for a follow-up appointment in 3 months.   check your blood sugar 4 times a day: before the 3 meals, and at bedtime.  also check if you have symptoms of your blood sugar being too high or too low.  please keep a record of the readings and bring it to your next appointment here.  You can write it on any piece of paper.  please call us sooner if your blood sugar goes below 70, or if you have a lot of readings over 200.   Please increase the insulin to 30 units with breakfast (22 if you are going to be active), and 22 units with the evening meal.

## 2014-11-26 NOTE — Progress Notes (Signed)
Subjective:    Patient ID: Henry LessGlenn T Sagar Jr., male    DOB: May 18, 1941, 73 y.o.   MRN: [id number]  HPI  Pt returns for f/u of diabetes mellitus: DM type: 1 Dx'ed: 1961 Complications: renal insufficiency, CAD, PAD, and retinopathy Therapy: insulin since dx DKA: never Severe hypoglycemia: last episode was approx 2008  Pancreatitis: never Other: after poor results with multiple daily injections, in mid-2015, he was changed to 70/30; he has declined pump and continuous glucose monitor; pt says he cannot afford insulin analogs Interval history: he brings a record of his cbg's which i have reviewed today.  It varies from 98-300's.  It is in general higher as the day goes on, but not necessarily so.  Pt is unable to say why cbg varies so much, except for exertion.  pt states he feels well in general. Past Medical History  Diagnosis Date  . ANEMIA-NOS 07/10/2007  . CORONARY ARTERY DISEASE 07/10/2007  . DIABETES MELLITUS, TYPE I 07/10/2007  . HYPERLIPIDEMIA 07/10/2007  . HYPERTENSION 07/10/2007  . RENAL INSUFFICIENCY 07/10/2007  . 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 Peripheral arterial disease     Past Surgical History  Procedure Laterality Date  . 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   Marland Kitchen. Appendectomy    . Carotid endarterectomy Right     right  . Cardiac catheterization      stenting in the vein graft to the right coronary artery and had_DES 3x 26 mm Integrity stent inserted, post dilated to 3.4 mm    History   Social History  . Marital Status: Married    Spouse Name: N/A    Number of Children: N/A  . Years of Education: N/A   Occupational History  . Not on file.   Social History Main Topics  . Smoking status: Former Games developermoker  . Smokeless tobacco: Never Used  . Alcohol Use: No  . Drug Use: No  . Sexual Activity: Not on file   Other Topics Concern  . Not on file   Social History Narrative   Says his diet and  exercise are very good    Current Outpatient Prescriptions on File Prior to Visit  Medication Sig Dispense Refill  . aspirin 81 MG tablet Take 81 mg by mouth daily.      . B Complex-C-Folic Acid (FOLBEE PLUS) TABS Take 1 tablet by mouth daily.      . Cholecalciferol (VITAMIN D) 2000 UNITS CAPS Take 1 capsule by mouth daily.     . hydrochlorothiazide 25 MG tablet Take 25 mg by mouth every other day.     . isosorbide mononitrate (IMDUR) 60 MG 24 hr tablet TAKE 1 TABLET BY MOUTH EVERY DAY 30 tablet 7  . labetalol (NORMODYNE) 200 MG tablet Take 100 mg by mouth 3 (three) times daily.     Marland Kitchen. omeprazole (PRILOSEC) 20 MG capsule Take 20 mg by mouth daily.       No current facility-administered medications on file prior to visit.    Allergies  Allergen Reactions  . Amlodipine Besylate   . Codeine     anxiety  . Lipitor [Atorvastatin Calcium]     myalgias  . Penicillins   . Valsartan     Family History  Problem Relation Age of Onset  . Cancer Neg Hx   . Heart disease Mother     BP 148/52 mmHg  Pulse 76  Wt 155 lb (70.308 kg)  Review of Systems He denies hypoglycemia and weight change.      Objective:   Physical Exam VITAL SIGNS:  See vs page.  GENERAL: no distress. Pulses: dorsalis pedis intact bilat.  Skin: no ulcer on the feet. feet are of normal temp.  There is patchy hyperpigmentation There is an old healed vein harvest scar at the left leg.  Feet: no deformity.  Trace bilat leg edema, and . There is bilateral onychomycosis.  Neuro: sensation is intact to touch on the feet, but decreased from normal.     Lab Results  Component Value Date   HGBA1C 10.4* 11/26/2014      Assessment & Plan:  DM: severe exacerbation.  therapy limited by variable cbg's CAD: in the setting, we need to increase insulin cautiously. HTN: mild exacerbation.  He recently saw cardiol, so we'll continue the same medications for now.      Patient is advised the following: Patient  Instructions  The best way to keep your blood sugar is a good range is to use an insulin pump.  Please let me know if you want to go ahead with that.   blood tests are being requested for you today.  We'll contact you with results.   Please come back for a follow-up appointment in 3 months.   check your blood sugar 4 times a day: before the 3 meals, and at bedtime.  also check if you have symptoms of your blood sugar being too high or too low.  please keep a record of the readings and bring it to your next appointment here.  You can write it on any piece of paper.  please call us sooner if your blood sugar goes below 70, or if you have a lot of readings over 200.   Please increase the insulin to 30 units with breakfast (22 if you are going to be active), and 22 units with the evening meal.

## 2014-12-02 DIAGNOSIS — N183 Chronic kidney disease, stage 3 (moderate): Secondary | ICD-10-CM | POA: Diagnosis not present

## 2014-12-02 DIAGNOSIS — I10 Essential (primary) hypertension: Secondary | ICD-10-CM | POA: Diagnosis not present

## 2014-12-07 DIAGNOSIS — I10 Essential (primary) hypertension: Secondary | ICD-10-CM | POA: Diagnosis not present

## 2014-12-07 DIAGNOSIS — N390 Urinary tract infection, site not specified: Secondary | ICD-10-CM | POA: Diagnosis not present

## 2014-12-07 DIAGNOSIS — N183 Chronic kidney disease, stage 3 (moderate): Secondary | ICD-10-CM | POA: Diagnosis not present

## 2014-12-16 DIAGNOSIS — R809 Proteinuria, unspecified: Secondary | ICD-10-CM | POA: Diagnosis not present

## 2014-12-21 ENCOUNTER — Encounter: Payer: Self-pay | Admitting: Podiatry

## 2014-12-21 ENCOUNTER — Ambulatory Visit (INDEPENDENT_AMBULATORY_CARE_PROVIDER_SITE_OTHER): Payer: Medicare Other | Admitting: Podiatry

## 2014-12-21 DIAGNOSIS — B351 Tinea unguium: Secondary | ICD-10-CM

## 2014-12-21 DIAGNOSIS — M79676 Pain in unspecified toe(s): Secondary | ICD-10-CM

## 2014-12-21 NOTE — Patient Instructions (Signed)
Pain certification for diabetic shoes from Dr. Everardo AllEllison  Diabetes and Foot Care Diabetes may cause you to have problems because of poor blood supply (circulation) to your feet and legs. This may cause the skin on your feet to become thinner, break easier, and heal more slowly. Your skin may become dry, and the skin may peel and crack. You may also have nerve damage in your legs and feet causing decreased feeling in them. You may not notice minor injuries to your feet that could lead to infections or more serious problems. Taking care of your feet is one of the most important things you can do for yourself.  HOME CARE INSTRUCTIONS  Wear shoes at all times, even in the house. Do not go barefoot. Bare feet are easily injured.  Check your feet daily for blisters, cuts, and redness. If you cannot see the bottom of your feet, use a mirror or ask someone for help.  Wash your feet with warm water (do not use hot water) and mild soap. Then pat your feet and the areas between your toes until they are completely dry. Do not soak your feet as this can dry your skin.  Apply a moisturizing lotion or petroleum jelly (that does not contain alcohol and is unscented) to the skin on your feet and to dry, brittle toenails. Do not apply lotion between your toes.  Trim your toenails straight across. Do not dig under them or around the cuticle. File the edges of your nails with an emery board or nail file.  Do not cut corns or calluses or try to remove them with medicine.  Wear clean socks or stockings every day. Make sure they are not too tight. Do not wear knee-high stockings since they may decrease blood flow to your legs.  Wear shoes that fit properly and have enough cushioning. To break in new shoes, wear them for just a few hours a day. This prevents you from injuring your feet. Always look in your shoes before you put them on to be sure there are no objects inside.  Do not cross your legs. This may decrease the  blood flow to your feet.  If you find a minor scrape, cut, or break in the skin on your feet, keep it and the skin around it clean and dry. These areas may be cleansed with mild soap and water. Do not cleanse the area with peroxide, alcohol, or iodine.  When you remove an adhesive bandage, be sure not to damage the skin around it.  If you have a wound, look at it several times a day to make sure it is healing.  Do not use heating pads or hot water bottles. They may burn your skin. If you have lost feeling in your feet or legs, you may not know it is happening until it is too late.  Make sure your health care provider performs a complete foot exam at least annually or more often if you have foot problems. Report any cuts, sores, or bruises to your health care provider immediately. SEEK MEDICAL CARE IF:   You have an injury that is not healing.  You have cuts or breaks in the skin.  You have an ingrown nail.  You notice redness on your legs or feet.  You feel burning or tingling in your legs or feet.  You have pain or cramps in your legs and feet.  Your legs or feet are numb.  Your feet always feel cold. SEEK IMMEDIATE  MEDICAL CARE IF:   There is increasing redness, swelling, or pain in or around a wound.  There is a red line that goes up your leg.  Pus is coming from a wound.  You develop a fever or as directed by your health care provider.  You notice a bad smell coming from an ulcer or wound. Document Released: 12/08/2000 Document Revised: 08/13/2013 Document Reviewed: 05/20/2013 [facility] Patient Information 2015 [place], [place]. This information is not intended to replace advice given to you by your health care provider. Make sure you discuss any questions you have with your health care provider.

## 2014-12-22 NOTE — Progress Notes (Signed)
Patient ID: Henry LessGlenn T Seelig Jr., male   DOB: 12/05/1941, 73 y.o.   MRN: [id number]  Subjective: This diabetic patient with a history of peripheral arterial disease presents for debridement of nails and keratoses. He has been evaluated for peripheral arterial disease by Dr. Allyson SabalBerry and no active treatment was recommended.  Objective: The toenails are incurvated, discolored, elongated and tender to palpation Tailor's bunion deformity bilaterally Plantar keratoses fifth MPJ bilaterally  Assessment: Symptomatic onychomycoses 6-10  Diabetic peripheral arterial disease  Plan: Debrided toenails 10 without a bleeding Recommend diabetic shoes to prevent breakdown of possible keratoses in the fifth MPJ obtain medical clearance  Indications include Peripheral arterial disease Tailor's bunion deformity Plantar callus  Reappoint 2 months

## 2014-12-31 DIAGNOSIS — Z961 Presence of intraocular lens: Secondary | ICD-10-CM | POA: Diagnosis not present

## 2014-12-31 DIAGNOSIS — E11311 Type 2 diabetes mellitus with unspecified diabetic retinopathy with macular edema: Secondary | ICD-10-CM | POA: Diagnosis not present

## 2014-12-31 DIAGNOSIS — E10351 Type 1 diabetes mellitus with proliferative diabetic retinopathy with macular edema: Secondary | ICD-10-CM | POA: Diagnosis not present

## 2015-01-04 DIAGNOSIS — N390 Urinary tract infection, site not specified: Secondary | ICD-10-CM | POA: Diagnosis not present

## 2015-01-04 DIAGNOSIS — I10 Essential (primary) hypertension: Secondary | ICD-10-CM | POA: Diagnosis not present

## 2015-01-04 DIAGNOSIS — N183 Chronic kidney disease, stage 3 (moderate): Secondary | ICD-10-CM | POA: Diagnosis not present

## 2015-01-13 DIAGNOSIS — I10 Essential (primary) hypertension: Secondary | ICD-10-CM | POA: Diagnosis not present

## 2015-01-25 DIAGNOSIS — J90 Pleural effusion, not elsewhere classified: Secondary | ICD-10-CM | POA: Diagnosis not present

## 2015-01-25 DIAGNOSIS — R9431 Abnormal electrocardiogram [ECG] [EKG]: Secondary | ICD-10-CM | POA: Diagnosis not present

## 2015-01-25 DIAGNOSIS — N183 Chronic kidney disease, stage 3 (moderate): Secondary | ICD-10-CM | POA: Diagnosis not present

## 2015-01-25 DIAGNOSIS — I10 Essential (primary) hypertension: Secondary | ICD-10-CM | POA: Diagnosis not present

## 2015-01-25 DIAGNOSIS — I110 Hypertensive heart disease with heart failure: Secondary | ICD-10-CM | POA: Diagnosis not present

## 2015-01-25 DIAGNOSIS — J9811 Atelectasis: Secondary | ICD-10-CM | POA: Diagnosis not present

## 2015-02-01 DIAGNOSIS — N390 Urinary tract infection, site not specified: Secondary | ICD-10-CM | POA: Diagnosis not present

## 2015-02-01 DIAGNOSIS — E559 Vitamin D deficiency, unspecified: Secondary | ICD-10-CM | POA: Diagnosis not present

## 2015-02-01 DIAGNOSIS — I10 Essential (primary) hypertension: Secondary | ICD-10-CM | POA: Diagnosis not present

## 2015-02-01 DIAGNOSIS — N183 Chronic kidney disease, stage 3 (moderate): Secondary | ICD-10-CM | POA: Diagnosis not present

## 2015-02-22 ENCOUNTER — Ambulatory Visit (INDEPENDENT_AMBULATORY_CARE_PROVIDER_SITE_OTHER): Payer: Medicare Other | Admitting: Podiatry

## 2015-02-22 DIAGNOSIS — M79676 Pain in unspecified toe(s): Secondary | ICD-10-CM

## 2015-02-22 DIAGNOSIS — B351 Tinea unguium: Secondary | ICD-10-CM | POA: Diagnosis not present

## 2015-02-22 NOTE — Patient Instructions (Signed)
Diabetes and Foot Care Diabetes may cause you to have problems because of poor blood supply (circulation) to your feet and legs. This may cause the skin on your feet to become thinner, break easier, and heal more slowly. Your skin may become dry, and the skin may peel and crack. You may also have nerve damage in your legs and feet causing decreased feeling in them. You may not notice minor injuries to your feet that could lead to infections or more serious problems. Taking care of your feet is one of the most important things you can do for yourself.  HOME CARE INSTRUCTIONS  Wear shoes at all times, even in the house. Do not go barefoot. Bare feet are easily injured.  Check your feet daily for blisters, cuts, and redness. If you cannot see the bottom of your feet, use a mirror or ask someone for help.  Wash your feet with warm water (do not use hot water) and mild soap. Then pat your feet and the areas between your toes until they are completely dry. Do not soak your feet as this can dry your skin.  Apply a moisturizing lotion or petroleum jelly (that does not contain alcohol and is unscented) to the skin on your feet and to dry, brittle toenails. Do not apply lotion between your toes.  Trim your toenails straight across. Do not dig under them or around the cuticle. File the edges of your nails with an emery board or nail file.  Do not cut corns or calluses or try to remove them with medicine.  Wear clean socks or stockings every day. Make sure they are not too tight. Do not wear knee-high stockings since they may decrease blood flow to your legs.  Wear shoes that fit properly and have enough cushioning. To break in new shoes, wear them for just a few hours a day. This prevents you from injuring your feet. Always look in your shoes before you put them on to be sure there are no objects inside.  Do not cross your legs. This may decrease the blood flow to your feet.  If you find a minor scrape,  cut, or break in the skin on your feet, keep it and the skin around it clean and dry. These areas may be cleansed with mild soap and water. Do not cleanse the area with peroxide, alcohol, or iodine.  When you remove an adhesive bandage, be sure not to damage the skin around it.  If you have a wound, look at it several times a day to make sure it is healing.  Do not use heating pads or hot water bottles. They may burn your skin. If you have lost feeling in your feet or legs, you may not know it is happening until it is too late.  Make sure your health care provider performs a complete foot exam at least annually or more often if you have foot problems. Report any cuts, sores, or bruises to your health care provider immediately. SEEK MEDICAL CARE IF:   You have an injury that is not healing.  You have cuts or breaks in the skin.  You have an ingrown nail.  You notice redness on your legs or feet.  You feel burning or tingling in your legs or feet.  You have pain or cramps in your legs and feet.  Your legs or feet are numb.  Your feet always feel cold. SEEK IMMEDIATE MEDICAL CARE IF:   There is increasing redness,   swelling, or pain in or around a wound.  There is a red line that goes up your leg.  Pus is coming from a wound.  You develop a fever or as directed by your health care provider.  You notice a bad smell coming from an ulcer or wound. Document Released: 12/08/2000 Document Revised: 08/13/2013 Document Reviewed: 05/20/2013 [employer] Patient Information 2015 [employer]. This information is not intended to replace advice given to you by your health care provider. Make sure you discuss any questions you have with your health care provider.  

## 2015-02-22 NOTE — Progress Notes (Signed)
Patient ID: Henry LessGlenn T Budreau Jr., male   DOB: [birth year], 74 y.o.   MRN: [id number]  Subjective: Patient presents again complaining of painful toenails and requests debridement. The peripheral arterial disease of is under evaluation by Dr. Nanetta BattyJonathan [name] he describes scaling associated with reaction from diuretic which is proving since he DC'd to diuretic. We've attempted to get certification for diabetic shoes from Dr. Daivd [name], however, Dr. Daivd [name] will not complete the certification form as of the state.  Objective: The toenails are elongated, incurvated, discolored 6-10. Mild scaling medial right ankle Small keratoses fifth right MPJ  Assessment: History of peripheral arterial disease Symptomatic onychomycoses 6-10 Plantar callus Mild scaling right ankle  Plan: Debridement toenails 10 without a bleeding Advised patient use all-purpose skin lotion daily for dry skin/scaling  I will check to see if Dr. Daivd [name] will complete certification form if not will refer patient to [facility] for diabetic shoes at next visit.

## 2015-02-24 DIAGNOSIS — N183 Chronic kidney disease, stage 3 (moderate): Secondary | ICD-10-CM | POA: Diagnosis not present

## 2015-02-24 DIAGNOSIS — I10 Essential (primary) hypertension: Secondary | ICD-10-CM | POA: Diagnosis not present

## 2015-03-01 DIAGNOSIS — I10 Essential (primary) hypertension: Secondary | ICD-10-CM | POA: Diagnosis not present

## 2015-03-01 DIAGNOSIS — N183 Chronic kidney disease, stage 3 (moderate): Secondary | ICD-10-CM | POA: Diagnosis not present

## 2015-03-01 DIAGNOSIS — E559 Vitamin D deficiency, unspecified: Secondary | ICD-10-CM | POA: Diagnosis not present

## 2015-03-01 DIAGNOSIS — N390 Urinary tract infection, site not specified: Secondary | ICD-10-CM | POA: Diagnosis not present

## 2015-03-03 ENCOUNTER — Encounter: Payer: Self-pay | Admitting: Endocrinology

## 2015-03-03 ENCOUNTER — Ambulatory Visit (INDEPENDENT_AMBULATORY_CARE_PROVIDER_SITE_OTHER): Payer: Medicare Other | Admitting: Endocrinology

## 2015-03-03 VITALS — BP 120/62 | HR 74 | Wt 153.8 lb

## 2015-03-03 DIAGNOSIS — E1029 Type 1 diabetes mellitus with other diabetic kidney complication: Secondary | ICD-10-CM

## 2015-03-03 DIAGNOSIS — IMO0002 Reserved for concepts with insufficient information to code with codable children: Secondary | ICD-10-CM

## 2015-03-03 DIAGNOSIS — E1065 Type 1 diabetes mellitus with hyperglycemia: Principal | ICD-10-CM

## 2015-03-03 LAB — HEMOGLOBIN A1C: HEMOGLOBIN A1C: 9.8 % — AB (ref 4.6–6.5)

## 2015-03-03 MED ORDER — INSULIN NPH ISOPHANE & REGULAR (70-30) 100 UNIT/ML ~~LOC~~ SUSP: SUBCUTANEOUS | Status: DC

## 2015-03-03 NOTE — Progress Notes (Signed)
Subjective:    Patient ID: Henry LessGlenn T Croom Jr., male    DOB: May 17, 1941, 74 y.o.   MRN: [id number]  HPI Pt returns for f/u of diabetes mellitus: DM type: 1 Dx'ed: 1961 Complications: renal insufficiency, CAD, PAD, and retinopathy. Therapy: insulin since dx DKA: never Severe hypoglycemia: last episode was approx 2008  Pancreatitis: never Other: after poor results with multiple daily injections, in mid-2015, he was changed to 70/30; he has declined pump and continuous glucose monitor; pt says he cannot afford insulin analogs.   Interval history: he brings a record of his cbg's which i have reviewed today.  It varies from 76-280.  There is no trend throughout the day.  Pt is again unable to say why cbg varies so much.  pt states he feels well in general.   Past Medical History  Diagnosis Date  . ANEMIA-NOS 07/10/2007  . CORONARY ARTERY DISEASE 07/10/2007  . DIABETES MELLITUS, TYPE I 07/10/2007  . HYPERLIPIDEMIA 07/10/2007  . HYPERTENSION 07/10/2007  . RENAL INSUFFICIENCY 07/10/2007  . 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 Peripheral arterial disease     Past Surgical History  Procedure Laterality Date  . 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   Marland Kitchen. Appendectomy    . Carotid endarterectomy Right     right  . Cardiac catheterization      stenting in the vein graft to the right coronary artery and had_DES 3x 26 mm Integrity stent inserted, post dilated to 3.4 mm    History   Social History  . Marital Status: Married    Spouse Name: N/A  . Number of Children: N/A  . Years of Education: N/A   Occupational History  . Not on file.   Social History Main Topics  . Smoking status: Former Games developermoker  . Smokeless tobacco: Never Used  . Alcohol Use: No  . Drug Use: No  . Sexual Activity: Not on file   Other Topics Concern  . Not on file   Social History Narrative   Says his diet and exercise are very good    Current Outpatient  Prescriptions on File Prior to Visit  Medication Sig Dispense Refill  . aspirin 81 MG tablet Take 81 mg by mouth daily.      . B Complex-C-Folic Acid (FOLBEE PLUS) TABS Take 1 tablet by mouth daily.      . Cholecalciferol (VITAMIN D) 2000 UNITS CAPS Take 1 capsule by mouth daily.     . hydrALAZINE (APRESOLINE) 25 MG tablet Take 12.5 mg by mouth daily.     . hydrochlorothiazide 25 MG tablet Take 25 mg by mouth every other day.     . isosorbide mononitrate (IMDUR) 60 MG 24 hr tablet TAKE 1 TABLET BY MOUTH EVERY DAY 30 tablet 7  . labetalol (NORMODYNE) 200 MG tablet Take 200 mg by mouth 2 (two) times daily.     Marland Kitchen. omeprazole (PRILOSEC) 20 MG capsule Take 20 mg by mouth daily.       No current facility-administered medications on file prior to visit.    Allergies  Allergen Reactions  . Amlodipine Besylate   . Codeine     anxiety  . Lipitor [Atorvastatin Calcium]     myalgias  . Penicillins   . Valsartan     Family History  Problem Relation Age of Onset  . Cancer Neg Hx   . Heart disease Mother     BP 120/62 mmHg  Pulse 74  Wt 153 lb 12.8 oz (69.763 kg)  SpO2 97%  Review of Systems Denies LOC and weight change.      Objective:   Physical Exam VITAL SIGNS:  See vs page. GENERAL: no distress.  Pulses: dorsalis pedis intact bilat.  Skin: no ulcer on the feet. feet are of normal temp. There is patchy hyperpigmentation There is an old healed vein harvest scar at the left leg.  Feet: no deformity. . no leg edema, and . There is bilateral onychomycosis.  Neuro: sensation is intact to touch on the feet, but decreased from normal.    Lab Results  Component Value Date   HGBA1C 9.8* 03/03/2015      Assessment & Plan:  DM: he needs increased rx   Patient is advised the following: Patient Instructions  blood tests are being requested for you today.  We'll contact you with results.   If it is high, we'll increase the breakfast insulin a little more. Please come back for a  follow-up appointment in 3 months.   check your blood sugar 4 times a day: before the 3 meals, and at bedtime.  also check if you have symptoms of your blood sugar being too high or too low.  please keep a record of the readings and bring it to your next appointment here.  You can write it on any piece of paper.  please call us sooner if your blood sugar goes below 70, or if you have a lot of readings over 200.   Please increase the insulin to 32 units with breakfast (22 if you are going to be active), and 22 units with the evening meal.    addendum: increase am insulin to 35 units

## 2015-03-03 NOTE — Patient Instructions (Addendum)
blood tests are being requested for you today.  We'll contact you with results.   If it is high, we'll increase the breakfast insulin a little more. Please come back for a follow-up appointment in 3 months.   check your blood sugar 4 times a day: before the 3 meals, and at bedtime.  also check if you have symptoms of your blood sugar being too high or too low.  please keep a record of the readings and bring it to your next appointment here.  You can write it on any piece of paper.  please call us sooner if your blood sugar goes below 70, or if you have a lot of readings over 200.   Please increase the insulin to 32 units with breakfast (22 if you are going to be active), and 22 units with the evening meal.

## 2015-03-08 ENCOUNTER — Ambulatory Visit: Payer: Medicare Other | Admitting: Endocrinology

## 2015-03-25 DIAGNOSIS — I10 Essential (primary) hypertension: Secondary | ICD-10-CM | POA: Diagnosis not present

## 2015-03-25 DIAGNOSIS — N183 Chronic kidney disease, stage 3 (moderate): Secondary | ICD-10-CM | POA: Diagnosis not present

## 2015-03-25 DIAGNOSIS — N390 Urinary tract infection, site not specified: Secondary | ICD-10-CM | POA: Diagnosis not present

## 2015-03-25 DIAGNOSIS — E559 Vitamin D deficiency, unspecified: Secondary | ICD-10-CM | POA: Diagnosis not present

## 2015-04-07 DIAGNOSIS — I10 Essential (primary) hypertension: Secondary | ICD-10-CM | POA: Diagnosis not present

## 2015-04-07 DIAGNOSIS — N183 Chronic kidney disease, stage 3 (moderate): Secondary | ICD-10-CM | POA: Diagnosis not present

## 2015-04-22 DIAGNOSIS — N390 Urinary tract infection, site not specified: Secondary | ICD-10-CM | POA: Diagnosis not present

## 2015-04-22 DIAGNOSIS — E559 Vitamin D deficiency, unspecified: Secondary | ICD-10-CM | POA: Diagnosis not present

## 2015-04-22 DIAGNOSIS — I10 Essential (primary) hypertension: Secondary | ICD-10-CM | POA: Diagnosis not present

## 2015-04-22 DIAGNOSIS — N183 Chronic kidney disease, stage 3 (moderate): Secondary | ICD-10-CM | POA: Diagnosis not present

## 2015-04-26 ENCOUNTER — Ambulatory Visit (INDEPENDENT_AMBULATORY_CARE_PROVIDER_SITE_OTHER): Payer: Medicare Other | Admitting: Podiatry

## 2015-04-26 ENCOUNTER — Encounter: Payer: Self-pay | Admitting: Podiatry

## 2015-04-26 DIAGNOSIS — B351 Tinea unguium: Secondary | ICD-10-CM | POA: Diagnosis not present

## 2015-04-26 DIAGNOSIS — M79676 Pain in unspecified toe(s): Secondary | ICD-10-CM

## 2015-04-26 NOTE — Patient Instructions (Signed)
We are referring you to [employer] for diabetic shoes for custom insoles Contact them directly to schedule an appointment  Diabetes and Foot Care Diabetes may cause you to have problems because of poor blood supply (circulation) to your feet and legs. This may cause the skin on your feet to become thinner, break easier, and heal more slowly. Your skin may become dry, and the skin may peel and crack. You may also have nerve damage in your legs and feet causing decreased feeling in them. You may not notice minor injuries to your feet that could lead to infections or more serious problems. Taking care of your feet is one of the most important things you can do for yourself.  HOME CARE INSTRUCTIONS  Wear shoes at all times, even in the house. Do not go barefoot. Bare feet are easily injured.  Check your feet daily for blisters, cuts, and redness. If you cannot see the bottom of your feet, use a mirror or ask someone for help.  Wash your feet with warm water (do not use hot water) and mild soap. Then pat your feet and the areas between your toes until they are completely dry. Do not soak your feet as this can dry your skin.  Apply a moisturizing lotion or petroleum jelly (that does not contain alcohol and is unscented) to the skin on your feet and to dry, brittle toenails. Do not apply lotion between your toes.  Trim your toenails straight across. Do not dig under them or around the cuticle. File the edges of your nails with an emery board or nail file.  Do not cut corns or calluses or try to remove them with medicine.  Wear clean socks or stockings every day. Make sure they are not too tight. Do not wear knee-high stockings since they may decrease blood flow to your legs.  Wear shoes that fit properly and have enough cushioning. To break in new shoes, wear them for just a few hours a day. This prevents you from injuring your feet. Always look in your shoes before you put them on to be sure there are no  objects inside.  Do not cross your legs. This may decrease the blood flow to your feet.  If you find a minor scrape, cut, or break in the skin on your feet, keep it and the skin around it clean and dry. These areas may be cleansed with mild soap and water. Do not cleanse the area with peroxide, alcohol, or iodine.  When you remove an adhesive bandage, be sure not to damage the skin around it.  If you have a wound, look at it several times a day to make sure it is healing.  Do not use heating pads or hot water bottles. They may burn your skin. If you have lost feeling in your feet or legs, you may not know it is happening until it is too late.  Make sure your health care provider performs a complete foot exam at least annually or more often if you have foot problems. Report any cuts, sores, or bruises to your health care provider immediately. SEEK MEDICAL CARE IF:   You have an injury that is not healing.  You have cuts or breaks in the skin.  You have an ingrown nail.  You notice redness on your legs or feet.  You feel burning or tingling in your legs or feet.  You have pain or cramps in your legs and feet.  Your legs or  feet are numb.  Your feet always feel cold. SEEK IMMEDIATE MEDICAL CARE IF:   There is increasing redness, swelling, or pain in or around a wound.  There is a red line that goes up your leg.  Pus is coming from a wound.  You develop a fever or as directed by your health care provider.  You notice a bad smell coming from an ulcer or wound. Document Released: 12/08/2000 Document Revised: 08/13/2013 Document Reviewed: 05/20/2013 [facility] Patient Information 2015 [place], [place]. This information is not intended to replace advice given to you by your health care provider. Make sure you discuss any questions you have with your health care provider.

## 2015-04-26 NOTE — Progress Notes (Signed)
Patient ID: Henry LessGlenn T Owusu Jr., male   DOB: 1941-02-09, 74 y.o.   MRN: [id number]  Subjective: This patient presents for ongoing debridement of mycotic toenails. He has started gabapentin 100 mg 3 times a day since his previous visit to treat symptoms of diabetic peripheral neuropathy.  Dr. Everardo AllEllison will not sign certification for diabetic shoes  Objective: The toenails elongated, incurvated, discolored and tender to palpation 6-10 Plantar keratoses fifth MPJ bilaterally  Assessment: Diabetic with a history of peripheral arterial disease Diabetic with peripheral neuropathy Mycotic toenails 6-10 Plantar keratoses bilaterally  Plan: Debridement toenails 10 and keratoses 2 without any bleeding  Patient is referred to [facility] for Rx :diabetic shoes for the indication of Diabetic with peripheral arterial disease and peripheral neuropathy Plantar callus  Reappoint 3 months

## 2015-05-20 DIAGNOSIS — E559 Vitamin D deficiency, unspecified: Secondary | ICD-10-CM | POA: Diagnosis not present

## 2015-05-20 DIAGNOSIS — N390 Urinary tract infection, site not specified: Secondary | ICD-10-CM | POA: Diagnosis not present

## 2015-05-20 DIAGNOSIS — I10 Essential (primary) hypertension: Secondary | ICD-10-CM | POA: Diagnosis not present

## 2015-05-20 DIAGNOSIS — N183 Chronic kidney disease, stage 3 (moderate): Secondary | ICD-10-CM | POA: Diagnosis not present

## 2015-06-03 ENCOUNTER — Ambulatory Visit: Payer: PRIVATE HEALTH INSURANCE | Admitting: Endocrinology

## 2015-06-03 DIAGNOSIS — Z961 Presence of intraocular lens: Secondary | ICD-10-CM | POA: Diagnosis not present

## 2015-06-03 DIAGNOSIS — E10351 Type 1 diabetes mellitus with proliferative diabetic retinopathy with macular edema: Secondary | ICD-10-CM | POA: Diagnosis not present

## 2015-06-03 DIAGNOSIS — E11311 Type 2 diabetes mellitus with unspecified diabetic retinopathy with macular edema: Secondary | ICD-10-CM | POA: Diagnosis not present

## 2015-06-05 ENCOUNTER — Other Ambulatory Visit: Payer: Self-pay | Admitting: Cardiovascular Disease

## 2015-06-23 ENCOUNTER — Ambulatory Visit (INDEPENDENT_AMBULATORY_CARE_PROVIDER_SITE_OTHER): Payer: Medicare Other | Admitting: Endocrinology

## 2015-06-23 ENCOUNTER — Encounter: Payer: Self-pay | Admitting: Endocrinology

## 2015-06-23 VITALS — BP 132/54 | HR 73 | Temp 97.9°F | Ht 70.0 in | Wt 153.0 lb

## 2015-06-23 DIAGNOSIS — E1029 Type 1 diabetes mellitus with other diabetic kidney complication: Secondary | ICD-10-CM

## 2015-06-23 DIAGNOSIS — E1065 Type 1 diabetes mellitus with hyperglycemia: Principal | ICD-10-CM

## 2015-06-23 DIAGNOSIS — IMO0002 Reserved for concepts with insufficient information to code with codable children: Secondary | ICD-10-CM

## 2015-06-23 LAB — HEMOGLOBIN A1C: Hgb A1c MFr Bld: 10.5 % — ABNORMAL HIGH (ref 4.6–6.5)

## 2015-06-23 MED ORDER — INSULIN NPH ISOPHANE & REGULAR (70-30) 100 UNIT/ML ~~LOC~~ SUSP: SUBCUTANEOUS | Status: DC

## 2015-06-23 NOTE — Patient Instructions (Addendum)
blood tests are being requested for you today.  We'll contact you with results.   Please come back for a "medicare wellness" appointment in 3 months.   check your blood sugar 4 times a day: before the 3 meals, and at bedtime.  also check if you have symptoms of your blood sugar being too high or too low.  please keep a record of the readings and bring it to your next appointment here.  You can write it on any piece of paper.  please call us sooner if your blood sugar goes below 70, or if you have a lot of readings over 200.   Please increase the insulin to 38 units with breakfast (30 if you are going to be active), and 22 units with the evening meal.

## 2015-06-23 NOTE — Progress Notes (Signed)
Subjective:    Patient ID: Henry [name]., male    DOB: 10/22/1941, 74 y.o.   MRN: [id number]  HPI Pt returns for f/u of diabetes mellitus: DM type: 1 Dx'ed: 1961 Complications: renal insufficiency, CAD, PAD, and retinopathy. Therapy: insulin since dx.  DKA: never.   Severe hypoglycemia: last episode was approx 2008.   Pancreatitis: never.  Other: after poor results with multiple daily injections, in mid-2015, he was changed to 70/30; he has declined pump and continuous glucose monitor; pt says he cannot afford insulin analogs.   Interval history: he brings a record of his cbg's which i have reviewed today.  It varies from 108-300.  It is in general higher as the day goes on, but not necessarily so.  He is unable to cite a reason for the variable cbg's.    Past Medical History  Diagnosis Date  . ANEMIA-NOS 07/10/2007  . CORONARY ARTERY DISEASE 07/10/2007  . DIABETES MELLITUS, TYPE I 07/10/2007  . HYPERLIPIDEMIA 07/10/2007  . HYPERTENSION 07/10/2007  . RENAL INSUFFICIENCY 07/10/2007  . 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 Peripheral arterial disease     Past Surgical History  Procedure Laterality Date  . 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   Marland Kitchen Appendectomy    . Carotid endarterectomy Right     right  . Cardiac catheterization      stenting in the vein graft to the right coronary artery and had_DES 3x 26 mm Integrity stent inserted, post dilated to 3.4 mm    History   Social History  . Marital Status: Married    Spouse Name: N/A  . Number of Children: N/A  . Years of Education: N/A   Occupational History  . Not on file.   Social History Main Topics  . Smoking status: Former Games developer  . Smokeless tobacco: Never Used  . Alcohol Use: No  . Drug Use: No  . Sexual Activity: Not on file   Other Topics Concern  . Not on file   Social History Narrative   Says his diet and exercise are very good    Current  Outpatient Prescriptions on File Prior to Visit  Medication Sig Dispense Refill  . aspirin 81 MG tablet Take 81 mg by mouth daily.      . B Complex-C-Folic Acid (FOLBEE PLUS) TABS Take 1 tablet by mouth daily.      . Cholecalciferol (VITAMIN D) 2000 UNITS CAPS Take 1 capsule by mouth daily.     Marland Kitchen gabapentin (NEURONTIN) 100 MG capsule Take 100 mg by mouth 3 (three) times daily.    . hydrALAZINE (APRESOLINE) 25 MG tablet Take 12.5 mg by mouth daily.     . hydrochlorothiazide (MICROZIDE) 12.5 MG capsule Take 12.5 mg by mouth daily.    . hydrochlorothiazide 25 MG tablet Take 25 mg by mouth every other day.     . isosorbide mononitrate (IMDUR) 60 MG 24 hr tablet TAKE 1 TABLET BY MOUTH EVERY DAY 30 tablet 5  . labetalol (NORMODYNE) 200 MG tablet Take 200 mg by mouth 2 (two) times daily.     Marland Kitchen omeprazole (PRILOSEC) 20 MG capsule Take 20 mg by mouth daily.       No current facility-administered medications on file prior to visit.    Allergies  Allergen Reactions  . Amlodipine Besylate   . Codeine     anxiety  . Lipitor [Atorvastatin Calcium]     myalgias  .  Penicillins   . Valsartan     Family History  Problem Relation Age of Onset  . Cancer Neg Hx   . Heart disease Mother     BP 132/54 mmHg  Pulse 73  Temp(Src) 97.9 F (36.6 C) (Oral)  Ht 5\' 10"  (1.778 m)  Wt 153 lb (69.4 kg)  BMI 21.95 kg/m2  SpO2 99%  Review of Systems He denies hypoglycemia and weight change.     Objective:   Physical Exam VITAL SIGNS:  See vs page GENERAL: no distress Pulses: dorsalis pedis intact bilat.  Skin: no ulcer on the feet. feet are of normal temp. There is patchy hyperpigmentation There is an old healed vein harvest scar at the left leg.  Feet: no deformity. . no leg edema, and . There is bilateral onychomycosis.  Neuro: sensation is intact to touch on the feet, but decreased from normal.    Lab Results  Component Value Date   HGBA1C 10.5* 06/23/2015      Assessment & Plan:  DM:  glycemic control is worse.     Patient is advised the following: Patient Instructions  blood tests are being requested for you today.  We'll contact you with results.   Please come back for a "medicare wellness" appointment in 3 months.   check your blood sugar 4 times a day: before the 3 meals, and at bedtime.  also check if you have symptoms of your blood sugar being too high or too low.  please keep a record of the readings and bring it to your next appointment here.  You can write it on any piece of paper.  please call us sooner if your blood sugar goes below 70, or if you have a lot of readings over 200.   Please increase the insulin to 38 units with breakfast (30 if you are going to be active), and 22 units with the evening meal.

## 2015-06-25 ENCOUNTER — Telehealth: Payer: Self-pay | Admitting: Endocrinology

## 2015-06-25 NOTE — Telephone Encounter (Signed)
°  [employer] # [id number] name is Henry CornfieldStephanie  Needs more info with the notes to approve the shoes

## 2015-06-29 NOTE — Telephone Encounter (Signed)
Pt's h/o Peripheral arterial disease is documented in the record.  Do you have records of this from us?

## 2015-06-29 NOTE — Telephone Encounter (Signed)
Stephanie from [employer] called about diabetic shoe form. [employer] received the paper work and the foot exam form via epic. She stated the foot exam sheet they provide has to be completed as well to qualify the pt of his diabetic shoes. She stated the paper work and documentation they received does not qualify the pt.  Please advise, Thanks!

## 2015-06-30 ENCOUNTER — Ambulatory Visit (INDEPENDENT_AMBULATORY_CARE_PROVIDER_SITE_OTHER): Payer: Medicare Other | Admitting: Podiatry

## 2015-06-30 DIAGNOSIS — B351 Tinea unguium: Secondary | ICD-10-CM

## 2015-06-30 DIAGNOSIS — M79676 Pain in unspecified toe(s): Secondary | ICD-10-CM

## 2015-06-30 NOTE — Patient Instructions (Signed)
Leave bandage on third right toe 1-2 days. Remove and apply topical anabiotic ointment daily until a scab forms  Diabetes and Foot Care Diabetes may cause you to have problems because of poor blood supply (circulation) to your feet and legs. This may cause the skin on your feet to become thinner, break easier, and heal more slowly. Your skin may become dry, and the skin may peel and crack. You may also have nerve damage in your legs and feet causing decreased feeling in them. You may not notice minor injuries to your feet that could lead to infections or more serious problems. Taking care of your feet is one of the most important things you can do for yourself.  HOME CARE INSTRUCTIONS  Wear shoes at all times, even in the house. Do not go barefoot. Bare feet are easily injured.  Check your feet daily for blisters, cuts, and redness. If you cannot see the bottom of your feet, use a mirror or ask someone for help.  Wash your feet with warm water (do not use hot water) and mild soap. Then pat your feet and the areas between your toes until they are completely dry. Do not soak your feet as this can dry your skin.  Apply a moisturizing lotion or petroleum jelly (that does not contain alcohol and is unscented) to the skin on your feet and to dry, brittle toenails. Do not apply lotion between your toes.  Trim your toenails straight across. Do not dig under them or around the cuticle. File the edges of your nails with an emery board or nail file.  Do not cut corns or calluses or try to remove them with medicine.  Wear clean socks or stockings every day. Make sure they are not too tight. Do not wear knee-high stockings since they may decrease blood flow to your legs.  Wear shoes that fit properly and have enough cushioning. To break in new shoes, wear them for just a few hours a day. This prevents you from injuring your feet. Always look in your shoes before you put them on to be sure there are no objects  inside.  Do not cross your legs. This may decrease the blood flow to your feet.  If you find a minor scrape, cut, or break in the skin on your feet, keep it and the skin around it clean and dry. These areas may be cleansed with mild soap and water. Do not cleanse the area with peroxide, alcohol, or iodine.  When you remove an adhesive bandage, be sure not to damage the skin around it.  If you have a wound, look at it several times a day to make sure it is healing.  Do not use heating pads or hot water bottles. They may burn your skin. If you have lost feeling in your feet or legs, you may not know it is happening until it is too late.  Make sure your health care provider performs a complete foot exam at least annually or more often if you have foot problems. Report any cuts, sores, or bruises to your health care provider immediately. SEEK MEDICAL CARE IF:   You have an injury that is not healing.  You have cuts or breaks in the skin.  You have an ingrown nail.  You notice redness on your legs or feet.  You feel burning or tingling in your legs or feet.  You have pain or cramps in your legs and feet.  Your legs or  feet are numb.  Your feet always feel cold. SEEK IMMEDIATE MEDICAL CARE IF:   There is increasing redness, swelling, or pain in or around a wound.  There is a red line that goes up your leg.  Pus is coming from a wound.  You develop a fever or as directed by your health care provider.  You notice a bad smell coming from an ulcer or wound. Document Released: 12/08/2000 Document Revised: 08/13/2013 Document Reviewed: 05/20/2013 [facility] Patient Information 2015 [place], [place]. This information is not intended to replace advice given to you by your health care provider. Make sure you discuss any questions you have with your health care provider.

## 2015-06-30 NOTE — Progress Notes (Signed)
Patient ID: Henry LessGlenn T Stracener Jr., male   DOB: 09-28-1941, 74 y.o.   MRN: [id number]  Subjective: Patient presents again for scheduled visit complaining of painful toenails and questions nail debridement  Objective: The toenails are elongated, brittle, incurvated, discolored and tender direct palpation 6-10 Minimal keratoses overlying lateral fifth MPJ bilaterally  Assessment: Diabetic with a history of peripheral arterial disease and neuropathy Symptomatic onychomycoses 6-10 Keratoses 2  Plan: Debridement toenails mechanically and electrically. Pinpoint be bleeding distal third right toe treated with anabiotic ointment and Band-Aid. Patient made aware of this and advised to leave bandages on 1-2 days and then remove and apply topical anabiotic ointment daily until a scab forms  Electrical debridement of keratoses fifth MPJ bilaterally without a bleeding  Reappoint patient 61 days

## 2015-06-30 NOTE — Telephone Encounter (Signed)
i can put "poor circulation."  This is the best I can do

## 2015-06-30 NOTE — Telephone Encounter (Signed)
I contacted Therapist, [employer]. They advised PAD is not a sufficient dx. The form from [employer] has to be completed and has to say pt needs diabetic shoes because ulcer, callus, amputation ect. On the foot has occurred due to the pt's diabetes. Please advise, Thanks!

## 2015-07-01 NOTE — Telephone Encounter (Signed)
I contacted [employer]. They are requesting the foot examination section of the note to be completed with poor circulation stated in that section.

## 2015-07-01 NOTE — Telephone Encounter (Signed)
Paper work resubmitted to Therapist, [employer].

## 2015-07-01 NOTE — Telephone Encounter (Signed)
Please refer to my note.

## 2015-07-16 ENCOUNTER — Encounter (HOSPITAL_COMMUNITY): Payer: Self-pay

## 2015-07-16 ENCOUNTER — Emergency Department (HOSPITAL_COMMUNITY)
Admission: EM | Admit: 2015-07-16 | Discharge: 2015-07-16 | Disposition: A | Discharge status: Home / Self Care | Payer: Medicare Other | Attending: Emergency Medicine | Admitting: Emergency Medicine

## 2015-07-16 ENCOUNTER — Emergency Department (HOSPITAL_COMMUNITY): Payer: Medicare Other

## 2015-07-16 DIAGNOSIS — Z880 Allergy status to penicillin: Secondary | ICD-10-CM | POA: Insufficient documentation

## 2015-07-16 DIAGNOSIS — Z79899 Other long term (current) drug therapy: Secondary | ICD-10-CM | POA: Diagnosis not present

## 2015-07-16 DIAGNOSIS — R42 Dizziness and giddiness: Secondary | ICD-10-CM | POA: Diagnosis not present

## 2015-07-16 DIAGNOSIS — Z7982 Long term (current) use of aspirin: Secondary | ICD-10-CM | POA: Insufficient documentation

## 2015-07-16 DIAGNOSIS — I2510 Atherosclerotic heart disease of native coronary artery without angina pectoris: Secondary | ICD-10-CM | POA: Insufficient documentation

## 2015-07-16 DIAGNOSIS — Z87891 Personal history of nicotine dependence: Secondary | ICD-10-CM | POA: Diagnosis not present

## 2015-07-16 DIAGNOSIS — E10649 Type 1 diabetes mellitus with hypoglycemia without coma: Secondary | ICD-10-CM | POA: Diagnosis not present

## 2015-07-16 DIAGNOSIS — R51 Headache: Secondary | ICD-10-CM | POA: Insufficient documentation

## 2015-07-16 DIAGNOSIS — Z87448 Personal history of other diseases of urinary system: Secondary | ICD-10-CM | POA: Insufficient documentation

## 2015-07-16 DIAGNOSIS — Z794 Long term (current) use of insulin: Secondary | ICD-10-CM | POA: Insufficient documentation

## 2015-07-16 DIAGNOSIS — Z862 Personal history of diseases of the blood and blood-forming organs and certain disorders involving the immune mechanism: Secondary | ICD-10-CM | POA: Insufficient documentation

## 2015-07-16 DIAGNOSIS — R410 Disorientation, unspecified: Secondary | ICD-10-CM | POA: Diagnosis not present

## 2015-07-16 DIAGNOSIS — E162 Hypoglycemia, unspecified: Secondary | ICD-10-CM

## 2015-07-16 DIAGNOSIS — I10 Essential (primary) hypertension: Secondary | ICD-10-CM | POA: Insufficient documentation

## 2015-07-16 DIAGNOSIS — E161 Other hypoglycemia: Secondary | ICD-10-CM | POA: Diagnosis not present

## 2015-07-16 DIAGNOSIS — R404 Transient alteration of awareness: Secondary | ICD-10-CM | POA: Diagnosis not present

## 2015-07-16 DIAGNOSIS — H538 Other visual disturbances: Secondary | ICD-10-CM | POA: Diagnosis not present

## 2015-07-16 LAB — CBC WITH DIFFERENTIAL/PLATELET
BASOS PCT: 1 % (ref 0–1)
Basophils Absolute: 0 10*3/uL (ref 0.0–0.1)
EOS ABS: 0.3 10*3/uL (ref 0.0–0.7)
Eosinophils Relative: 3 % (ref 0–5)
HEMATOCRIT: 34.4 % — AB (ref 39.0–52.0)
HEMOGLOBIN: 11.6 g/dL — AB (ref 13.0–17.0)
LYMPHS ABS: 2.4 10*3/uL (ref 0.7–4.0)
Lymphocytes Relative: 27 % (ref 12–46)
MCH: 30.5 pg (ref 26.0–34.0)
MCHC: 33.7 g/dL (ref 30.0–36.0)
MCV: 90.5 fL (ref 78.0–100.0)
MONO ABS: 0.5 10*3/uL (ref 0.1–1.0)
MONOS PCT: 6 % (ref 3–12)
NEUTROS ABS: 5.4 10*3/uL (ref 1.7–7.7)
Neutrophils Relative %: 63 % (ref 43–77)
PLATELETS: 226 10*3/uL (ref 150–400)
RBC: 3.8 MIL/uL — ABNORMAL LOW (ref 4.22–5.81)
RDW: 14.1 % (ref 11.5–15.5)
WBC: 8.6 10*3/uL (ref 4.0–10.5)

## 2015-07-16 LAB — BASIC METABOLIC PANEL
Anion gap: 5 (ref 5–15)
BUN: 35 mg/dL — ABNORMAL HIGH (ref 6–20)
CHLORIDE: 114 mmol/L — AB (ref 101–111)
CO2: 22 mmol/L (ref 22–32)
CREATININE: 1.6 mg/dL — AB (ref 0.61–1.24)
Calcium: 9.6 mg/dL (ref 8.9–10.3)
GFR calc Af Amer: 48 mL/min — ABNORMAL LOW (ref >60)
GFR, EST NON AFRICAN AMERICAN: 41 mL/min — AB (ref >60)
GLUCOSE: 96 mg/dL (ref 65–99)
Potassium: 3.4 mmol/L — ABNORMAL LOW (ref 3.5–5.1)
Sodium: 141 mmol/L (ref 135–145)

## 2015-07-16 LAB — CBG MONITORING, ED
Glucose-Capillary: 100 mg/dL — ABNORMAL HIGH (ref 65–99)
Glucose-Capillary: 133 mg/dL — ABNORMAL HIGH (ref 65–99)
Glucose-Capillary: 140 mg/dL — ABNORMAL HIGH (ref 65–99)

## 2015-07-16 MED ORDER — SODIUM CHLORIDE 0.9 % IV BOLUS (SEPSIS)
1000.0000 mL | Freq: Once | INTRAVENOUS | Status: AC
Start: 2015-07-16 — End: 2015-07-16
  Administered 2015-07-16: 1000 mL via INTRAVENOUS

## 2015-07-16 NOTE — ED Notes (Signed)
The pt has nss added to the saline ;lok

## 2015-07-16 NOTE — Discharge Instructions (Signed)
Blood Glucose Monitoring °Monitoring your blood glucose (also know as blood sugar) helps you to manage your diabetes. It also helps you and your health care provider monitor your diabetes and determine how well your treatment plan is working. °WHY SHOULD YOU MONITOR YOUR BLOOD GLUCOSE? °· It can help you understand how food, exercise, and medicine affect your blood glucose. °· It allows you to know what your blood glucose is at any given moment. You can quickly tell if you are having low blood glucose (hypoglycemia) or high blood glucose (hyperglycemia). °· It can help you and your health care provider know how to adjust your medicines. °· It can help you understand how to manage an illness or adjust medicine for exercise. °WHEN SHOULD YOU TEST? °Your health care provider will help you decide how often you should check your blood glucose. This may depend on the type of diabetes you have, your diabetes control, or the types of medicines you are taking. Be sure to write down all of your blood glucose readings so that this information can be reviewed with your health care provider. See below for examples of testing times that your health care provider may suggest. °Type 1 Diabetes °· Test 4 times a day if you are in good control, using an insulin pump, or perform multiple daily injections. °· If your diabetes is not well controlled or if you are sick, you may need to monitor more often. °· It is a good idea to also monitor: °· Before and after exercise. °· Between meals and 2 hours after a meal. °· Occasionally between 2:00 a.m. and 3:00 a.m. °Type 2 Diabetes °· It can vary with each person, but generally, if you are on insulin, test 4 times a day. °· If you take medicines by mouth (orally), test 2 times a day. °· If you are on a controlled diet, test once a day. °· If your diabetes is not well controlled or if you are sick, you may need to monitor more often. °HOW TO MONITOR YOUR BLOOD GLUCOSE °Supplies  Needed °· Blood glucose meter. °· Test strips for your meter. Each meter has its own strips. You must use the strips that go with your own meter. °· A pricking needle (lancet). °· A device that holds the lancet (lancing device). °· A journal or log book to write down your results. °Procedure °· Wash your hands with soap and water. Alcohol is not preferred. °· Prick the side of your finger (not the tip) with the lancet. °· Gently milk the finger until a small drop of blood appears. °· Follow the instructions that come with your meter for inserting the test strip, applying blood to the strip, and using your blood glucose meter. °Other Areas to Get Blood for Testing °Some meters allow you to use other areas of your body (other than your finger) to test your blood. These areas are called alternative sites. The most common alternative sites are: °· The forearm. °· The thigh. °· The back area of the lower leg. °· The palm of the hand. °The blood flow in these areas is slower. Therefore, the blood glucose values you get may be delayed, and the numbers are different from what you would get from your fingers. Do not use alternative sites if you think you are having hypoglycemia. Your reading will not be accurate. Always use a finger if you are having hypoglycemia. Also, if you cannot feel your lows (hypoglycemia unawareness), always use your fingers for your   blood glucose checks. °ADDITIONAL TIPS FOR GLUCOSE MONITORING °· Do not reuse lancets. °· Always carry your supplies with you. °· All blood glucose meters have a 24-hour "hotline" number to call if you have questions or need help. °· Adjust (calibrate) your blood glucose meter with a control solution after finishing a few boxes of strips. °BLOOD GLUCOSE RECORD KEEPING °It is a good idea to keep a daily record or log of your blood glucose readings. Most glucose meters, if not all, keep your glucose records stored in the meter. Some meters come with the ability to download  your records to your home computer. Keeping a record of your blood glucose readings is especially helpful if you are wanting to look for patterns. Make notes to go along with the blood glucose readings because you might forget what happened at that exact time. Keeping good records helps you and your health care provider to work together to achieve good diabetes management.  °Document Released: 12/14/2003 Document Revised: 04/27/2014 Document Reviewed: 05/05/2013 °[employer]® Patient Information ©2015 [employer]. This information is not intended to replace advice given to you by your health care provider. Make sure you discuss any questions you have with your health care provider. ° °Hypoglycemia °Hypoglycemia occurs when the glucose in your blood is too low. Glucose is a type of sugar that is your body's main energy source. Hormones, such as insulin and glucagon, control the level of glucose in the blood. Insulin lowers blood glucose and glucagon increases blood glucose. Having too much insulin in your blood stream, or not eating enough food containing sugar, can result in hypoglycemia. Hypoglycemia can happen to people with or without diabetes. It can develop quickly and can be a medical emergency.  °CAUSES  °· Missing or delaying meals. °· Not eating enough carbohydrates at meals. °· Taking too much diabetes medicine. °· Not timing your oral diabetes medicine or insulin doses with meals, snacks, and exercise. °· Nausea and vomiting. °· Certain medicines. °· Severe illnesses, such as hepatitis, kidney disorders, and certain eating disorders. °· Increased activity or exercise without eating something extra or adjusting medicines. °· Drinking too much alcohol. °· A nerve disorder that affects body functions like your heart rate, blood pressure, and digestion (autonomic neuropathy). °· A condition where the stomach muscles do not function properly (gastroparesis). Therefore, medicines and food may not absorb  properly. °· Rarely, a tumor of the pancreas can produce too much insulin. °SYMPTOMS  °· Hunger. °· Sweating (diaphoresis). °· Change in body temperature. °· Shakiness. °· Headache. °· Anxiety. °· Lightheadedness. °· Irritability. °· Difficulty concentrating. °· Dry mouth. °· Tingling or numbness in the hands or feet. °· Restless sleep or sleep disturbances. °· Altered speech and coordination. °· Change in mental status. °· Seizures or prolonged convulsions. °· Combativeness. °· Drowsiness (lethargic). °· Weakness. °· Increased heart rate or palpitations. °· Confusion. °· Pale, gray skin color. °· Blurred or double vision. °· Fainting. °DIAGNOSIS  °A physical exam and medical history will be performed. Your caregiver may make a diagnosis based on your symptoms. Blood tests and other lab tests may be performed to confirm a diagnosis. Once the diagnosis is made, your caregiver will see if your signs and symptoms go away once your blood glucose is raised.  °TREATMENT  °Usually, you can easily treat your hypoglycemia when you notice symptoms. °· Check your blood glucose. If it is less than 70 mg/dl, take one of the following:   °¨ 3-4 glucose tablets.   °¨ ½ cup   juice.   °¨ ½ cup regular soda.   °¨ 1 cup skim milk.   °¨ ½-1 tube of glucose gel.   °¨ 5-6 hard candies.   °· Avoid high-fat drinks or food that may delay a rise in blood glucose levels. °· Do not take more than the recommended amount of sugary foods, drinks, gel, or tablets. Doing so will cause your blood glucose to go too high.   °· Wait 10-15 minutes and recheck your blood glucose. If it is still less than 70 mg/dl or below your target range, repeat treatment.   °· Eat a snack if it is more than 1 hour until your next meal.   °There may be a time when your blood glucose may go so low that you are unable to treat yourself at home when you start to notice symptoms. You may need someone to help you. You may even faint or be unable to swallow. If you cannot  treat yourself, someone will need to bring you to the hospital.  °HOME CARE INSTRUCTIONS °· If you have diabetes, follow your diabetes management plan by: °¨ Taking your medicines as directed. °¨ Following your exercise plan. °¨ Following your meal plan. Do not skip meals. Eat on time. °¨ Testing your blood glucose regularly. Check your blood glucose before and after exercise. If you exercise longer or different than usual, be sure to check blood glucose more frequently. °¨ Wearing your medical alert jewelry that says you have diabetes. °· Identify the cause of your hypoglycemia. Then, develop ways to prevent the recurrence of hypoglycemia. °· Do not take a hot bath or shower right after an insulin shot. °· Always carry treatment with you. Glucose tablets are the easiest to carry. °· If you are going to drink alcohol, drink it only with meals. °· Tell friends or family members ways to keep you safe during a seizure. This may include removing hard or sharp objects from the area or turning you on your side. °· Maintain a healthy weight. °SEEK MEDICAL CARE IF:  °· You are having problems keeping your blood glucose in your target range. °· You are having frequent episodes of hypoglycemia. °· You feel you might be having side effects from your medicines. °· You are not sure why your blood glucose is dropping so low. °· You notice a change in vision or a new problem with your vision. °SEEK IMMEDIATE MEDICAL CARE IF:  °· Confusion develops. °· A change in mental status occurs. °· The inability to swallow develops. °· Fainting occurs. °Document Released: 12/11/2005 Document Revised: 12/16/2013 Document Reviewed: 04/08/2012 °ExitCare® Patient Information ©2015 [employer]. This information is not intended to replace advice given to you by your health care provider. Make sure you discuss any questions you have with your health care provider. ° °

## 2015-07-16 NOTE — ED Notes (Signed)
The pt was offered  A meal before he leaves  He is going to eat on the way home

## 2015-07-16 NOTE — ED Notes (Signed)
The pt returned from c-t  Alert oriented skin warm and dry.  cbg elevated  Meal offered until  His regular tray ordered from the kitchen arrived.  He wants to wait

## 2015-07-16 NOTE — ED Provider Notes (Signed)
CSN: [id number]     Arrival date & time 07/16/15  1439 History   First MD Initiated Contact with Patient 07/16/15 1456     No chief complaint on file.    (Consider location/radiation/quality/duration/timing/severity/associated sxs/prior Treatment) HPI 74 year old male who is a type 1 diabetic presents with hypoglycemia occurring around 12 pm today. Patient checked his sugar this AM and was 166. Typically he runs 150s, but has been running higher recently. 1 month ago patient's PCP increased his morning insulin from 36 to 38 units. Still doing 22 units at night (70/30). Took morning insulin like normal. Ate breakfast, but did not eat lunch (would normally eat around time he had hypoglycemia). He was feeling tired all morning and diffusely weak. Laid down and passed out, wife found him and found him to be hypoglycemic (29 for EMS). EMS gave him oral glucose and [place] sandwich. Glucose here 100. Wife notes patient has complained of an intermittent left headache for past 1 week. Increasing in frequency. Not present now. Also has had intermittent left eye double vision, not at same time as headache. No other symptoms. No recent illness. Vision and headache do not occur together. When he has the double vision, he can close his left eye and his right vision is normal. Feels well currently.   Past Medical History  Diagnosis Date  . ANEMIA-NOS 07/10/2007  . CORONARY ARTERY DISEASE 07/10/2007  . DIABETES MELLITUS, TYPE I 07/10/2007  . HYPERLIPIDEMIA 07/10/2007  . HYPERTENSION 07/10/2007  . RENAL INSUFFICIENCY 07/10/2007  . 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 Peripheral arterial disease    Past Surgical History  Procedure Laterality Date  . 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   Marland Kitchen Appendectomy    . Carotid endarterectomy Right     right  . Cardiac catheterization      stenting in the vein graft to the right coronary artery and had_DES 3x 26 mm  Integrity stent inserted, post dilated to 3.4 mm   Family History  Problem Relation Age of Onset  . Cancer Neg Hx   . Heart disease Mother    History  Substance Use Topics  . Smoking status: Former Games developer  . Smokeless tobacco: Never Used  . Alcohol Use: No    Review of Systems  Constitutional: Positive for fatigue. Negative for fever.  Eyes: Positive for visual disturbance.  Respiratory: Negative for shortness of breath.   Cardiovascular: Negative for chest pain.  Gastrointestinal: Negative for vomiting, abdominal pain and diarrhea.  Neurological: Positive for weakness and headaches.  All other systems reviewed and are negative.     Allergies  Amlodipine besylate; Codeine; Lipitor; Penicillins; and Valsartan  Home Medications   Prior to Admission medications   Medication Sig Start Date End Date Taking? Authorizing Provider  aspirin 81 MG tablet Take 81 mg by mouth daily.      Historical Provider, MD  B Complex-C-Folic Acid (FOLBEE PLUS) TABS Take 1 tablet by mouth daily.      Historical Provider, MD  Cholecalciferol (VITAMIN D) 2000 UNITS CAPS Take 1 capsule by mouth daily.     Historical Provider, MD  gabapentin (NEURONTIN) 100 MG capsule Take 100 mg by mouth 3 (three) times daily.    Historical Provider, MD  hydrALAZINE (APRESOLINE) 25 MG tablet Take 12.5 mg by mouth daily.     Historical Provider, MD  hydrochlorothiazide (MICROZIDE) 12.5 MG capsule Take 12.5 mg by mouth daily.  Historical Provider, MD  hydrochlorothiazide 25 MG tablet Take 25 mg by mouth every other day.     Historical Provider, MD  insulin NPH-regular Human (NOVOLIN 70/30) (70-30) 100 UNIT/ML injection 38 units with breakfast, and 22 units with the evening meal 06/23/15   Romero Belling, MD  isosorbide mononitrate (IMDUR) 60 MG 24 hr tablet TAKE 1 TABLET BY MOUTH EVERY DAY 06/07/15   Lennette Bihari, MD  labetalol (NORMODYNE) 200 MG tablet Take 200 mg by mouth 2 (two) times daily.  02/04/14   Lennette Bihari,  MD  omeprazole (PRILOSEC) 20 MG capsule Take 20 mg by mouth daily.      Historical Provider, MD   BP 179/51 mmHg  Pulse 58  Resp 18  Ht  (1.778 m)  Wt 155 lb (70.308 kg)  BMI 22.24 kg/m2  SpO2 100% Physical Exam  Constitutional: He is oriented to person, place, and time. He appears well-developed and well-nourished.  HENT:  Head: Normocephalic and atraumatic.  Right Ear: External ear normal.  Left Ear: External ear normal.  Nose: Nose normal.  Eyes: EOM are normal. Pupils are equal, round, and reactive to light. Right eye exhibits no discharge. Left eye exhibits no discharge.  Neck: Neck supple.  Cardiovascular: Normal rate, regular rhythm, normal heart sounds and intact distal pulses.   Pulmonary/Chest: Effort normal and breath sounds normal.  Abdominal: Soft. There is no tenderness.  Musculoskeletal: He exhibits no edema.  Neurological: He is alert and oriented to person, place, and time.  CN 2-12 grossly intact. 5/5 strength in all 4 extremities. Normal gross sensation  Skin: Skin is warm and dry.  Nursing note and vitals reviewed.   ED Course  Procedures (including critical care time) Labs Review Labs Reviewed  BASIC METABOLIC PANEL - Abnormal; Notable for the following:    Potassium 3.4 (*)    Chloride 114 (*)    BUN 35 (*)    Creatinine, Ser 1.60 (*)    GFR calc non Af Amer 41 (*)    GFR calc Af Amer 48 (*)    All other components within normal limits  CBC WITH DIFFERENTIAL/PLATELET - Abnormal; Notable for the following:    RBC 3.80 (*)    Hemoglobin 11.6 (*)    HCT 34.4 (*)    All other components within normal limits  CBG MONITORING, ED - Abnormal; Notable for the following:    Glucose-Capillary 100 (*)    All other components within normal limits  CBG MONITORING, ED - Abnormal; Notable for the following:    Glucose-Capillary 133 (*)    All other components within normal limits  CBG MONITORING, ED - Abnormal; Notable for the following:     Glucose-Capillary 140 (*)    All other components within normal limits  CBG MONITORING, ED    Imaging Review Ct Head Wo Contrast  07/16/2015   CLINICAL DATA:  74 year old who presented with serum glucose level of 29, now with headache, confusion and blurred vision in the left eye.  EXAM: CT HEAD WITHOUT CONTRAST  TECHNIQUE: Contiguous axial images were obtained from the base of the skull through the vertex without intravenous contrast.  COMPARISON:  None.  FINDINGS: Ventricular system normal in size and appearance for age. Enlarged cisterna magna, a normal anatomic variant. No mass lesion. No midline shift. No acute hemorrhage or hematoma. No extra-axial fluid collections. No evidence of acute infarction. No focal parenchymal abnormality.  No skull fracture or other focal osseous abnormality involving the  skull. Mucosal thickening involving the base of the visualize right maxillary sinus. Remaining visualized paranasal sinuses, bilateral mastoid air cells and bilateral middle ear cavities well-aerated. Severe bilateral carotid siphon and vertebral artery atherosclerosis.  IMPRESSION: 1. No acute intracranial abnormality. 2. Mild chronic right maxillary sinus disease.   Electronically Signed   By: Hulan Saas M.D.   On: 07/16/2015 15:34     EKG Interpretation None      MDM   Final diagnoses:  Hypoglycemia    Patient has had no recurrence of hypoglycemia. He is otherwise well appearing. His creatinine is very mildly elevated to 1.6, typical is around 1.4. I doubt this is causing him to hold onto insulin longer. His monocular double vision has been ongoing for months, mildly increasing. Discussed with ophthalmology on call at [facility], his ophthalmologist is Dr. Gwendalyn Ege. At this point they recommend him calling the office as soon as possible for a close follow-up, they will need to do specific retinal exams and they state that no further ED workup would be indicated. Advised patient to hold  nightly insulin and resume like normal tomorrow.    Pricilla Loveless, MD 07/16/15 1730

## 2015-07-16 NOTE — ED Notes (Signed)
The pt meal from the kitchen just came up he is eating before he leaves.

## 2015-07-16 NOTE — ED Notes (Signed)
Pt presents with report of CBG of 29 at home.  EMS called to scene, pt awakened with verbal stimuli, able to sip juice and eat [place] sandwich prior to arrival.  Pt reports episode of BLE weakness at home but denies at present.

## 2015-08-12 DIAGNOSIS — I10 Essential (primary) hypertension: Secondary | ICD-10-CM | POA: Diagnosis not present

## 2015-08-12 DIAGNOSIS — N183 Chronic kidney disease, stage 3 (moderate): Secondary | ICD-10-CM | POA: Diagnosis not present

## 2015-08-19 DIAGNOSIS — I10 Essential (primary) hypertension: Secondary | ICD-10-CM | POA: Diagnosis not present

## 2015-08-19 DIAGNOSIS — E559 Vitamin D deficiency, unspecified: Secondary | ICD-10-CM | POA: Diagnosis not present

## 2015-08-19 DIAGNOSIS — N183 Chronic kidney disease, stage 3 (moderate): Secondary | ICD-10-CM | POA: Diagnosis not present

## 2015-08-19 DIAGNOSIS — N390 Urinary tract infection, site not specified: Secondary | ICD-10-CM | POA: Diagnosis not present

## 2015-08-31 ENCOUNTER — Encounter: Payer: Self-pay | Admitting: Podiatry

## 2015-08-31 ENCOUNTER — Ambulatory Visit (INDEPENDENT_AMBULATORY_CARE_PROVIDER_SITE_OTHER): Payer: Medicare Other | Admitting: Podiatry

## 2015-08-31 DIAGNOSIS — L84 Corns and callosities: Secondary | ICD-10-CM | POA: Diagnosis not present

## 2015-08-31 DIAGNOSIS — B351 Tinea unguium: Secondary | ICD-10-CM

## 2015-08-31 DIAGNOSIS — E1151 Type 2 diabetes mellitus with diabetic peripheral angiopathy without gangrene: Secondary | ICD-10-CM

## 2015-08-31 DIAGNOSIS — B353 Tinea pedis: Secondary | ICD-10-CM

## 2015-08-31 DIAGNOSIS — M79676 Pain in unspecified toe(s): Secondary | ICD-10-CM

## 2015-08-31 NOTE — Progress Notes (Signed)
Patient ID: Henry Dec., male   DOB: 04/13/1941, 74 y.o.   MRN: [id number] Subjective: This patient presents for scheduled visit complaining of painful toenails especially the medial margin of the left hallux toenail which is uncomfortable for the past month. He describes discomfort with direct shoe pressure or or blankets at night. Denies any any drainage His wife also noticed some spots on the bottom is right foot in the past several days like to have these evaluated  Objective: The toenails are elongated, incurvated, discolored and tender direct palpation 6-10 The medial margin left hallux toenail is incurvated with a callused nail groove and low-grade edema without any erythema, drainage. There is tender direct palpation Keratoses fifth MPJ bilaterally Vesicular eruption plantar right foot  Assessment: Symptomatic onychomycoses 6-10 Low-grade onychia medial margin left hallux nail Tinea pedis plantar right foot Keratoses 2  Plan: Debrided toenails 10 mechanical and electrical without any bleeding. The medial margin left hallux toenail was packed with antibiotic ointment. Patient noticed relief after the margin was debrided. Advised him to continue applying topical Vaseline to the medial margin right hallux nail cover with a Band-Aid until healed  Keratoses 2 debrided without any bleeding Rx over-the-counter 1% clotrimazole cream applied twice a day to the plantar skin 4 weeks  Reappoint 3 months or at patient's request

## 2015-08-31 NOTE — Patient Instructions (Signed)
You have the signs of athlete's foot on the bottom of the right foot (tinea pedis) Purchase over-the-counter clotrimazole 1% cream and apply twice a day to the bottom of foot 4 weeks Apply Vaseline to the inside margin of the right big toenail daily and cover with a Band-Aid until comfortable  Diabetes and Foot Care Diabetes may cause you to have problems because of poor blood supply (circulation) to your feet and legs. This may cause the skin on your feet to become thinner, break easier, and heal more slowly. Your skin may become dry, and the skin may peel and crack. You may also have nerve damage in your legs and feet causing decreased feeling in them. You may not notice minor injuries to your feet that could lead to infections or more serious problems. Taking care of your feet is one of the most important things you can do for yourself.  HOME CARE INSTRUCTIONS  Wear shoes at all times, even in the house. Do not go barefoot. Bare feet are easily injured.  Check your feet daily for blisters, cuts, and redness. If you cannot see the bottom of your feet, use a mirror or ask someone for help.  Wash your feet with warm water (do not use hot water) and mild soap. Then pat your feet and the areas between your toes until they are completely dry. Do not soak your feet as this can dry your skin.  Apply a moisturizing lotion or petroleum jelly (that does not contain alcohol and is unscented) to the skin on your feet and to dry, brittle toenails. Do not apply lotion between your toes.  Trim your toenails straight across. Do not dig under them or around the cuticle. File the edges of your nails with an emery board or nail file.  Do not cut corns or calluses or try to remove them with medicine.  Wear clean socks or stockings every day. Make sure they are not too tight. Do not wear knee-high stockings since they may decrease blood flow to your legs.  Wear shoes that fit properly and have enough  cushioning. To break in new shoes, wear them for just a few hours a day. This prevents you from injuring your feet. Always look in your shoes before you put them on to be sure there are no objects inside.  Do not cross your legs. This may decrease the blood flow to your feet.  If you find a minor scrape, cut, or break in the skin on your feet, keep it and the skin around it clean and dry. These areas may be cleansed with mild soap and water. Do not cleanse the area with peroxide, alcohol, or iodine.  When you remove an adhesive bandage, be sure not to damage the skin around it.  If you have a wound, look at it several times a day to make sure it is healing.  Do not use heating pads or hot water bottles. They may burn your skin. If you have lost feeling in your feet or legs, you may not know it is happening until it is too late.  Make sure your health care provider performs a complete foot exam at least annually or more often if you have foot problems. Report any cuts, sores, or bruises to your health care provider immediately. SEEK MEDICAL CARE IF:   You have an injury that is not healing.  You have cuts or breaks in the skin.  You have an ingrown nail.  You  notice redness on your legs or feet.  You feel burning or tingling in your legs or feet.  You have pain or cramps in your legs and feet.  Your legs or feet are numb.  Your feet always feel cold. SEEK IMMEDIATE MEDICAL CARE IF:   There is increasing redness, swelling, or pain in or around a wound.  There is a red line that goes up your leg.  Pus is coming from a wound.  You develop a fever or as directed by your health care provider.  You notice a bad smell coming from an ulcer or wound. Document Released: 12/08/2000 Document Revised: 08/13/2013 Document Reviewed: 05/20/2013 [facility] Patient Information 2015 [place], [place]. This information is not intended to replace advice given to you by your health care provider. Make  sure you discuss any questions you have with your health care provider.

## 2015-09-23 ENCOUNTER — Encounter: Payer: Self-pay | Admitting: Endocrinology

## 2015-09-23 ENCOUNTER — Ambulatory Visit (INDEPENDENT_AMBULATORY_CARE_PROVIDER_SITE_OTHER): Payer: Medicare Other | Admitting: Endocrinology

## 2015-09-23 VITALS — BP 138/74 | HR 61 | Temp 98.0°F | Ht 70.0 in | Wt 156.0 lb

## 2015-09-23 DIAGNOSIS — Z125 Encounter for screening for malignant neoplasm of prostate: Secondary | ICD-10-CM

## 2015-09-23 DIAGNOSIS — E1022 Type 1 diabetes mellitus with diabetic chronic kidney disease: Secondary | ICD-10-CM

## 2015-09-23 DIAGNOSIS — N259 Disorder resulting from impaired renal tubular function, unspecified: Secondary | ICD-10-CM

## 2015-09-23 DIAGNOSIS — Z0000 Encounter for general adult medical examination without abnormal findings: Secondary | ICD-10-CM | POA: Diagnosis not present

## 2015-09-23 DIAGNOSIS — N189 Chronic kidney disease, unspecified: Secondary | ICD-10-CM

## 2015-09-23 DIAGNOSIS — E785 Hyperlipidemia, unspecified: Secondary | ICD-10-CM | POA: Diagnosis not present

## 2015-09-23 DIAGNOSIS — D649 Anemia, unspecified: Secondary | ICD-10-CM | POA: Diagnosis not present

## 2015-09-23 LAB — CBC WITH DIFFERENTIAL/PLATELET
Basophils Absolute: 0 10*3/uL (ref 0.0–0.1)
Basophils Relative: 0.6 % (ref 0.0–3.0)
Eosinophils Absolute: 0.3 10*3/uL (ref 0.0–0.7)
Eosinophils Relative: 4.4 % (ref 0.0–5.0)
HCT: 34.4 % — ABNORMAL LOW (ref 39.0–52.0)
HEMOGLOBIN: 11.5 g/dL — AB (ref 13.0–17.0)
Lymphocytes Relative: 30.6 % (ref 12.0–46.0)
Lymphs Abs: 2.1 10*3/uL (ref 0.7–4.0)
MCHC: 33.4 g/dL (ref 30.0–36.0)
MCV: 91.3 fl (ref 78.0–100.0)
MONO ABS: 0.6 10*3/uL (ref 0.1–1.0)
Monocytes Relative: 8.4 % (ref 3.0–12.0)
Neutro Abs: 3.9 10*3/uL (ref 1.4–7.7)
Neutrophils Relative %: 56 % (ref 43.0–77.0)
Platelets: 245 10*3/uL (ref 150.0–400.0)
RBC: 3.77 Mil/uL — AB (ref 4.22–5.81)
RDW: 13.1 % (ref 11.5–15.5)
WBC: 6.9 10*3/uL (ref 4.0–10.5)

## 2015-09-23 LAB — TSH: TSH: 2.56 u[IU]/mL (ref 0.35–4.50)

## 2015-09-23 LAB — LIPID PANEL
CHOL/HDL RATIO: 4
Cholesterol: 158 mg/dL (ref 0–200)
HDL: 45 mg/dL (ref >39.00)
LDL CALC: 99 mg/dL (ref 0–99)
NONHDL: 112.76
Triglycerides: 71 mg/dL (ref 0.0–149.0)
VLDL: 14.2 mg/dL (ref 0.0–40.0)

## 2015-09-23 LAB — BASIC METABOLIC PANEL
BUN: 32 mg/dL — AB (ref 6–23)
CALCIUM: 9.5 mg/dL (ref 8.4–10.5)
CO2: 26 mEq/L (ref 19–32)
CREATININE: 1.47 mg/dL (ref 0.40–1.50)
Chloride: 107 mEq/L (ref 96–112)
GFR: 49.79 mL/min — AB (ref >60.00)
Glucose, Bld: 218 mg/dL — ABNORMAL HIGH (ref 70–99)
Potassium: 4.2 mEq/L (ref 3.5–5.1)
Sodium: 140 mEq/L (ref 135–145)

## 2015-09-23 LAB — HEPATIC FUNCTION PANEL
ALBUMIN: 3.9 g/dL (ref 3.5–5.2)
ALK PHOS: 66 U/L (ref 39–117)
ALT: 24 U/L (ref 0–53)
AST: 25 U/L (ref 0–37)
Bilirubin, Direct: 0.1 mg/dL (ref 0.0–0.3)
Total Bilirubin: 0.7 mg/dL (ref 0.2–1.2)
Total Protein: 6.8 g/dL (ref 6.0–8.3)

## 2015-09-23 LAB — URINALYSIS, ROUTINE W REFLEX MICROSCOPIC
Bilirubin Urine: NEGATIVE
Hgb urine dipstick: NEGATIVE
KETONES UR: NEGATIVE
Leukocytes, UA: NEGATIVE
NITRITE: NEGATIVE
PH: 6 (ref 5.0–8.0)
RBC / HPF: NONE SEEN (ref 0–?)
SPECIFIC GRAVITY, URINE: 1.01 (ref 1.000–1.030)
URINE GLUCOSE: 100 — AB
UROBILINOGEN UA: 0.2 (ref 0.0–1.0)
WBC, UA: NONE SEEN (ref 0–?)

## 2015-09-23 LAB — MICROALBUMIN / CREATININE URINE RATIO
Creatinine,U: 56.9 mg/dL
MICROALB UR: 14.1 mg/dL — AB (ref 0.0–1.9)
Microalb Creat Ratio: 24.8 mg/g (ref 0.0–30.0)

## 2015-09-23 LAB — POCT GLYCOSYLATED HEMOGLOBIN (HGB A1C): HEMOGLOBIN A1C: 9.7

## 2015-09-23 LAB — IBC PANEL
IRON: 137 ug/dL (ref 42–165)
SATURATION RATIOS: 42.5 % (ref 20.0–50.0)
Transferrin: 230 mg/dL (ref 212.0–360.0)

## 2015-09-23 LAB — PSA, MEDICARE: PSA: 1.19 ng/mL (ref 0.10–4.00)

## 2015-09-23 NOTE — Progress Notes (Signed)
Subjective:    Patient ID: Henry [name]., male    DOB: Apr 28, 1941, 74 y.o.   MRN: [id number]  HPI Pt returns for f/u of diabetes mellitus: DM type: 1 Dx'ed: 1961 Complications: renal insufficiency, CAD, PAD, and retinopathy. Therapy: insulin since dx.  DKA: never.   Severe hypoglycemia: last episode was approx 2008.   Pancreatitis: never.  Other: after poor results with multiple daily injections, in mid-2015, he was changed to 70/30; he has declined pump and continuous glucose monitor; pt says he cannot afford insulin analogs.   Interval history: he brings a record of his cbg's which i have reviewed today.  It varies from 83-307.  There is no trend throughout the day.   Dyslipidemia: he denies chest pain Renal insufficiency: he denies sob. Past Medical History  Diagnosis Date  . ANEMIA-NOS 07/10/2007  . CORONARY ARTERY DISEASE 07/10/2007  . DIABETES MELLITUS, TYPE I 07/10/2007  . HYPERLIPIDEMIA 07/10/2007  . HYPERTENSION 07/10/2007  . RENAL INSUFFICIENCY 07/10/2007  . 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 Peripheral arterial disease     Past Surgical History  Procedure Laterality Date  . 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   Marland Kitchen Appendectomy    . Carotid endarterectomy Right     right  . Cardiac catheterization      stenting in the vein graft to the right coronary artery and had_DES 3x 26 mm Integrity stent inserted, post dilated to 3.4 mm    Social History   Social History  . Marital Status: Married    Spouse Name: N/A  . Number of Children: N/A  . Years of Education: N/A   Occupational History  . Not on file.   Social History Main Topics  . Smoking status: Former Games developer  . Smokeless tobacco: Never Used  . Alcohol Use: No  . Drug Use: No  . Sexual Activity: Not on file   Other Topics Concern  . Not on file   Social History Narrative   Says his diet and exercise are very good    Current Outpatient  Prescriptions on File Prior to Visit  Medication Sig Dispense Refill  . acetaminophen (TYLENOL) 500 MG tablet Take 500 mg by mouth daily as needed (pain).    Marland Kitchen aspirin EC 81 MG tablet Take 81 mg by mouth daily.    . B Complex-C-Folic Acid (FOLBEE PLUS) TABS Take 1 tablet by mouth daily.      . Cholecalciferol (VITAMIN D) 2000 UNITS CAPS Take 2,000 Units by mouth daily.     Marland Kitchen gabapentin (NEURONTIN) 100 MG capsule Take 100 mg by mouth 2 (two) times daily.     . hydrochlorothiazide (MICROZIDE) 12.5 MG capsule Take 12.5 mg by mouth daily.    . insulin NPH-regular Human (NOVOLIN 70/30) (70-30) 100 UNIT/ML injection 38 units with breakfast, and 22 units with the evening meal (Patient taking differently: 2 (two) times daily before a meal. Inject 38 units before breakfast and 22 units before supper) 20 mL 11  . isosorbide mononitrate (IMDUR) 60 MG 24 hr tablet TAKE 1 TABLET BY MOUTH EVERY DAY 30 tablet 5  . labetalol (NORMODYNE) 200 MG tablet Take 100-200 mg by mouth 2 (two) times daily. Take 1/2 tablet (100 mg) every morning and 1 tablet (200 mg) every night    . omeprazole (PRILOSEC OTC) 20 MG tablet Take 20 mg by mouth daily.     No current facility-administered medications on file  prior to visit.    Allergies  Allergen Reactions  . Penicillins Other (See Comments)    Per allergy test.  . Amlodipine Besylate Swelling  . Codeine Other (See Comments)    anxiety  . Hydralazine Swelling    Severe foot swelling  . Lipitor [Atorvastatin Calcium] Other (See Comments)    myalgia  . Valsartan Other (See Comments)    Caused kidney damage    Family History  Problem Relation Age of Onset  . Cancer Neg Hx   . Heart disease Mother     BP 138/74 mmHg  Pulse 61  Temp(Src) 98 F (36.7 C) (Oral)  Ht  (1.778 m)  Wt 156 lb (70.761 kg)  BMI 22.38 kg/m2  SpO2 95%  Review of Systems He denies hypoglycemia and weight change    Objective:   Physical Exam VITAL SIGNS:  See vs  page. GENERAL: no distress. Pulses: dorsalis pedis intact bilat.  Skin: no ulcer on the feet. feet are of normal temp. There is patchy hyperpigmentation There is an old healed vein harvest scar at the left leg.  Feet: no deformity.  no leg edema, and . There is bilateral onychomycosis.  Neuro: sensation is intact to touch on the feet, but decreased from normal.    Lab Results  Component Value Date   WBC 6.9 09/23/2015   HGB 11.5* 09/23/2015   HCT 34.4* 09/23/2015   PLT 245.0 09/23/2015   GLUCOSE 218* 09/23/2015   CHOL 158 09/23/2015   TRIG 71.0 09/23/2015   HDL 45.00 09/23/2015   LDLCALC 99 09/23/2015   ALT 24 09/23/2015   AST 25 09/23/2015   NA 140 09/23/2015   K 4.2 09/23/2015   CL 107 09/23/2015   CREATININE 1.47 09/23/2015   BUN 32* 09/23/2015   CO2 26 09/23/2015   TSH 2.56 09/23/2015   PSA 1.19 09/23/2015   INR 1.16 02/28/2010   HGBA1C 9.7 09/23/2015   MICROALBUR 14.1* 09/23/2015      Assessment & Plan:  DM: this is the best control this pt should aim for, given his variable cbg's, CAD, and advanced age.  Same insulin for now.  Dyslipidemia: well-controlled.  Same rx Renal insuff, stable.  We'll follow   Subjective:   Patient here for Medicare annual wellness visit and management of other chronic and acute problems.     Risk factors: advanced age    Roster of Physicians Providing Medical Care to Patient:  See "snapshot"   Activities of Daily Living: In your present state of health, do you have any difficulty performing the following activities?:  Preparing food and eating?: No  Bathing yourself: No  Getting dressed: No  Using the toilet:No  Moving around from place to place: No  In the past year have you fallen or had a near fall?:No    Home Safety: Has smoke detector and wears seat belts. No firearms. No excess sun exposure.  Diet and Exercise  Current exercise habits: pt says good Dietary issues discussed: pt reports a healthy diet   Depression  Screen  Q1: Over the past two weeks, have you felt down, depressed or hopeless? no  Q2: Over the past two weeks, have you felt little interest or pleasure in doing things? no   The following portions of the patient's history were reviewed and updated as appropriate: allergies, current medications, past family history, past medical history, past social history, past surgical history and problem list.   Review of Systems  Denies hearing  loss, and visual loss Objective:   Vision:  Sees opthalmologist Hearing: grossly normal Body mass index:  See vs page Msk: pt easily and quickly performs "get-up-and-go" from a sitting position Cognitive Impairment Assessment: cognition, memory and judgment appear normal.  remembers 3/3 at 5 minutes.  excellent recall.  can easily read and write a sentence.  alert and oriented x 3.     Assessment:   Medicare wellness utd on preventive parameters    Plan:   During the course of the visit the patient was educated and counseled about appropriate screening and preventive services including:        Fall prevention   Diabetes screening  Nutrition counseling   Vaccines / LABS Zostavax / Pneumococcal Vaccine  today  PSA  Patient Instructions (the written plan) was given to the patient.

## 2015-09-23 NOTE — Progress Notes (Signed)
we discussed code status.  pt requests full code, but would not want to be started or maintained on artificial life-support measures if there was not a reasonable chance of recovery 

## 2015-09-23 NOTE — Patient Instructions (Addendum)
blood tests are being requested for you today.  We'll contact you with results.    check your blood sugar 4 times a day: before the 3 meals, and at bedtime.  also check if you have symptoms of your blood sugar being too high or too low.  please keep a record of the readings and bring it to your next appointment here.  You can write it on any piece of paper.  please call us sooner if your blood sugar goes below 70, or if you have a lot of readings over 200.   Please continue the same insulin: 38 units with breakfast (30 if you are going to be active), and 22 units with the evening meal.   please consider these measures for your health:  minimize alcohol.  do not use tobacco products.  have a colonoscopy at least every 10 years from age 42.  keep firearms safely stored.  always use seat belts.  have working smoke alarms in your home.  see an eye doctor and dentist regularly.  never drive under the influence of alcohol or drugs (including prescription drugs).  those with fair skin should take precautions against the sun. it is critically important to prevent falling down (keep floor areas well-lit, dry, and free of loose objects.  If you have a cane, walker, or wheelchair, you should use it, even for short trips around the house.  Also, try not to rush).  Please come back for a follow-up appointment in 4 months

## 2015-11-02 ENCOUNTER — Ambulatory Visit: Payer: Medicare Other | Admitting: Podiatry

## 2015-11-08 ENCOUNTER — Ambulatory Visit (INDEPENDENT_AMBULATORY_CARE_PROVIDER_SITE_OTHER): Payer: Medicare Other | Admitting: Endocrinology

## 2015-11-08 ENCOUNTER — Encounter: Payer: Self-pay | Admitting: Endocrinology

## 2015-11-08 VITALS — BP 132/70 | HR 83 | Temp 98.1°F | Ht 70.0 in | Wt 156.0 lb

## 2015-11-08 DIAGNOSIS — E1065 Type 1 diabetes mellitus with hyperglycemia: Secondary | ICD-10-CM

## 2015-11-08 DIAGNOSIS — N183 Chronic kidney disease, stage 3 (moderate): Secondary | ICD-10-CM

## 2015-11-08 DIAGNOSIS — IMO0002 Reserved for concepts with insufficient information to code with codable children: Secondary | ICD-10-CM

## 2015-11-08 DIAGNOSIS — E1022 Type 1 diabetes mellitus with diabetic chronic kidney disease: Secondary | ICD-10-CM

## 2015-11-08 MED ORDER — AZITHROMYCIN 500 MG PO TABS: 500.0000 mg | ORAL_TABLET | Freq: Every day | ORAL | Status: DC

## 2015-11-08 NOTE — Progress Notes (Signed)
Subjective:    Patient ID: Henry Less., male    DOB: 23-Sep-1941, 74 y.o.   MRN: [id number]  HPI  Pt returns for f/u of diabetes mellitus: DM type: 1 Dx'ed: 1961 Complications: renal insufficiency, CAD, PAD, and retinopathy. Therapy: insulin since dx.  DKA: never.   Severe hypoglycemia: last episode was approx 2008.   Pancreatitis: never.  Other: after poor results with multiple daily injections, in mid-2015, he was changed to 70/30; he has declined pump and continuous glucose monitor; pt says he cannot afford insulin analogs.   Interval history: cbg was 400 last week, but is not back to the 100's.  Pt states few days of moderate pain at the throat, tearing from both eyes, and assoc nasal congestion.  Past Medical History  Diagnosis Date  . ANEMIA-NOS 07/10/2007  . CORONARY ARTERY DISEASE 07/10/2007  . DIABETES MELLITUS, TYPE I 07/10/2007  . HYPERLIPIDEMIA 07/10/2007  . HYPERTENSION 07/10/2007  . RENAL INSUFFICIENCY 07/10/2007  . 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 Peripheral arterial disease [facility])     Past Surgical History  Procedure Laterality Date  . 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   Marland Kitchen Appendectomy    . Carotid endarterectomy Right     right  . Cardiac catheterization      stenting in the vein graft to the right coronary artery and had_DES 3x 26 mm Integrity stent inserted, post dilated to 3.4 mm    Social History   Social History  . Marital Status: Married    Spouse Name: N/A  . Number of Children: N/A  . Years of Education: N/A   Occupational History  . Not on file.   Social History Main Topics  . Smoking status: Former Games developer  . Smokeless tobacco: Never Used  . Alcohol Use: No  . Drug Use: No  . Sexual Activity: Not on file   Other Topics Concern  . Not on file   Social History Narrative   Says his diet and exercise are very good    Current Outpatient Prescriptions on File Prior to  Visit  Medication Sig Dispense Refill  . acetaminophen (TYLENOL) 500 MG tablet Take 500 mg by mouth daily as needed (pain).    Marland Kitchen aspirin EC 81 MG tablet Take 81 mg by mouth daily.    . B Complex-C-Folic Acid (FOLBEE PLUS) TABS Take 1 tablet by mouth daily.      . Cholecalciferol (VITAMIN D) 2000 UNITS CAPS Take 2,000 Units by mouth daily.     Marland Kitchen gabapentin (NEURONTIN) 100 MG capsule Take 100 mg by mouth 2 (two) times daily.     . hydrochlorothiazide (MICROZIDE) 12.5 MG capsule Take 12.5 mg by mouth daily.    . insulin NPH-regular Human (NOVOLIN 70/30) (70-30) 100 UNIT/ML injection 38 units with breakfast, and 22 units with the evening meal (Patient taking differently: 2 (two) times daily before a meal. Inject 38 units before breakfast and 22 units before supper) 20 mL 11  . isosorbide mononitrate (IMDUR) 60 MG 24 hr tablet TAKE 1 TABLET BY MOUTH EVERY DAY 30 tablet 5  . labetalol (NORMODYNE) 200 MG tablet Take 100-200 mg by mouth 2 (two) times daily. Take 1/2 tablet (100 mg) every morning and 1 tablet (200 mg) every night    . omeprazole (PRILOSEC OTC) 20 MG tablet Take 20 mg by mouth daily.     No current facility-administered medications on file prior to visit.  Allergies  Allergen Reactions  . Penicillins Other (See Comments)    Per allergy test.  . Amlodipine Besylate Swelling  . Codeine Other (See Comments)    anxiety  . Hydralazine Swelling    Severe foot swelling  . Lipitor [Atorvastatin Calcium] Other (See Comments)    myalgia  . Valsartan Other (See Comments)    Caused kidney damage    Family History  Problem Relation Age of Onset  . Cancer Neg Hx   . Heart disease Mother     BP 132/70 mmHg  Pulse 83  Temp(Src) 98.1 F (36.7 C) (Oral)  Ht 5\' 10"  (1.778 m)  Wt 156 lb (70.761 kg)  BMI 22.38 kg/m2  SpO2 99%  Review of Systems Denies fever, cough, and earache.     Objective:   Physical Exam VITAL SIGNS:  See vs page GENERAL: no distress. head: no  deformity eyes: no periorbital swelling, no proptosis external nose and ears are normal mouth: no lesion seen Both eac's and tm's are normal LUNGS:  Clear to auscultation      Assessment & Plan:  URI: new DM: this is the best control this pt should aim for, given his variable cbg's, CAD, and advanced age.    Patient is advised the following: Patient Instructions  i have sent a prescription to your pharmacy, for an antibiotic pill.   Loratadine-d (non-prescription) will help your congestion.   I hope you feel better soon.  If you don't feel better by next week, please call back.  Please call sooner if you get worse.   Please continue the same insulin for now.

## 2015-11-08 NOTE — Patient Instructions (Addendum)
i have sent a prescription to your pharmacy, for an antibiotic pill.   Loratadine-d (non-prescription) will help your congestion.   I hope you feel better soon.  If you don't feel better by next week, please call back.  Please call sooner if you get worse.   Please continue the same insulin for now.

## 2015-11-30 ENCOUNTER — Encounter: Payer: Self-pay | Admitting: Podiatry

## 2015-11-30 ENCOUNTER — Ambulatory Visit (INDEPENDENT_AMBULATORY_CARE_PROVIDER_SITE_OTHER): Payer: Medicare Other | Admitting: Podiatry

## 2015-11-30 DIAGNOSIS — B351 Tinea unguium: Secondary | ICD-10-CM

## 2015-11-30 DIAGNOSIS — L84 Corns and callosities: Secondary | ICD-10-CM

## 2015-11-30 DIAGNOSIS — M79676 Pain in unspecified toe(s): Secondary | ICD-10-CM

## 2015-11-30 DIAGNOSIS — E1151 Type 2 diabetes mellitus with diabetic peripheral angiopathy without gangrene: Secondary | ICD-10-CM | POA: Diagnosis not present

## 2015-11-30 NOTE — Patient Instructions (Signed)
Diabetes and Foot Care Diabetes may cause you to have problems because of poor blood supply (circulation) to your feet and legs. This may cause the skin on your feet to become thinner, break easier, and heal more slowly. Your skin may become dry, and the skin may peel and crack. You may also have nerve damage in your legs and feet causing decreased feeling in them. You may not notice minor injuries to your feet that could lead to infections or more serious problems. Taking care of your feet is one of the most important things you can do for yourself.  HOME CARE INSTRUCTIONS  Wear shoes at all times, even in the house. Do not go barefoot. Bare feet are easily injured.  Check your feet daily for blisters, cuts, and redness. If you cannot see the bottom of your feet, use a mirror or ask someone for help.  Wash your feet with warm water (do not use hot water) and mild soap. Then pat your feet and the areas between your toes until they are completely dry. Do not soak your feet as this can dry your skin.  Apply a moisturizing lotion or petroleum jelly (that does not contain alcohol and is unscented) to the skin on your feet and to dry, brittle toenails. Do not apply lotion between your toes.  Trim your toenails straight across. Do not dig under them or around the cuticle. File the edges of your nails with an emery board or nail file.  Do not cut corns or calluses or try to remove them with medicine.  Wear clean socks or stockings every day. Make sure they are not too tight. Do not wear knee-high stockings since they may decrease blood flow to your legs.  Wear shoes that fit properly and have enough cushioning. To break in new shoes, wear them for just a few hours a day. This prevents you from injuring your feet. Always look in your shoes before you put them on to be sure there are no objects inside.  Do not cross your legs. This may decrease the blood flow to your feet.  If you find a minor scrape,  cut, or break in the skin on your feet, keep it and the skin around it clean and dry. These areas may be cleansed with mild soap and water. Do not cleanse the area with peroxide, alcohol, or iodine.  When you remove an adhesive bandage, be sure not to damage the skin around it.  If you have a wound, look at it several times a day to make sure it is healing.  Do not use heating pads or hot water bottles. They may burn your skin. If you have lost feeling in your feet or legs, you may not know it is happening until it is too late.  Make sure your health care provider performs a complete foot exam at least annually or more often if you have foot problems. Report any cuts, sores, or bruises to your health care provider immediately. SEEK MEDICAL CARE IF:   You have an injury that is not healing.  You have cuts or breaks in the skin.  You have an ingrown nail.  You notice redness on your legs or feet.  You feel burning or tingling in your legs or feet.  You have pain or cramps in your legs and feet.  Your legs or feet are numb.  Your feet always feel cold. SEEK IMMEDIATE MEDICAL CARE IF:   There is increasing redness,   swelling, or pain in or around a wound.  There is a red line that goes up your leg.  Pus is coming from a wound.  You develop a fever or as directed by your health care provider.  You notice a bad smell coming from an ulcer or wound.   This information is not intended to replace advice given to you by your health care provider. Make sure you discuss any questions you have with your health care provider.   Document Released: 12/08/2000 Document Revised: 08/13/2013 Document Reviewed: 05/20/2013 Elsevier Interactive Patient Education 2016 [employer]  

## 2015-11-30 NOTE — Progress Notes (Signed)
Patient ID: Henry LessGlenn T Masri Jr., male   DOB: 1941-08-16, 74 y.o.   MRN: [id number]   Subjective: This patient presents for a scheduled visit again complaining of painful toenails walking wearing shoes and painful plantar calluses on right and left feet. He has requesting a general of 8 weeks versus 12 weeks for repetitive skin a nail debridement because of increasing discomfort  Objective: Orientated 3 No open skin lesions bilaterally The toenails elongated, brittle, discolored, hypertrophic and tender to direct palpation 6-10 Plantar keratoses sub-fifth MPJ bilaterally  Assessment: Diabetic with a history of peripheral arterial disease Symptomatic onychomycoses 6-10 Plantar keratoses 2  Plan: Debrided toenails 10 and mechanically and electronically without a bleeding Debride plantar keratoses 2 without a bleeding  Reappoint 10 week

## 2015-12-16 DIAGNOSIS — D510 Vitamin B12 deficiency anemia due to intrinsic factor deficiency: Secondary | ICD-10-CM | POA: Diagnosis not present

## 2015-12-16 DIAGNOSIS — E559 Vitamin D deficiency, unspecified: Secondary | ICD-10-CM | POA: Diagnosis not present

## 2015-12-16 DIAGNOSIS — N183 Chronic kidney disease, stage 3 (moderate): Secondary | ICD-10-CM | POA: Diagnosis not present

## 2015-12-22 DIAGNOSIS — N390 Urinary tract infection, site not specified: Secondary | ICD-10-CM | POA: Diagnosis not present

## 2015-12-22 DIAGNOSIS — N183 Chronic kidney disease, stage 3 (moderate): Secondary | ICD-10-CM | POA: Diagnosis not present

## 2015-12-22 DIAGNOSIS — I10 Essential (primary) hypertension: Secondary | ICD-10-CM | POA: Diagnosis not present

## 2015-12-22 DIAGNOSIS — E559 Vitamin D deficiency, unspecified: Secondary | ICD-10-CM | POA: Diagnosis not present

## 2016-01-14 ENCOUNTER — Other Ambulatory Visit: Payer: Self-pay | Admitting: Cardiovascular Disease

## 2016-01-14 NOTE — Telephone Encounter (Signed)
Rx(s) sent to pharmacy electronically.  

## 2016-01-24 ENCOUNTER — Ambulatory Visit (INDEPENDENT_AMBULATORY_CARE_PROVIDER_SITE_OTHER): Payer: Medicare Other | Admitting: Endocrinology

## 2016-01-24 ENCOUNTER — Encounter: Payer: Self-pay | Admitting: Endocrinology

## 2016-01-24 VITALS — BP 137/70 | HR 70 | Temp 97.9°F | Ht 70.0 in | Wt 158.0 lb

## 2016-01-24 DIAGNOSIS — E1022 Type 1 diabetes mellitus with diabetic chronic kidney disease: Secondary | ICD-10-CM

## 2016-01-24 DIAGNOSIS — IMO0002 Reserved for concepts with insufficient information to code with codable children: Secondary | ICD-10-CM

## 2016-01-24 DIAGNOSIS — E1065 Type 1 diabetes mellitus with hyperglycemia: Secondary | ICD-10-CM | POA: Diagnosis not present

## 2016-01-24 DIAGNOSIS — N183 Chronic kidney disease, stage 3 (moderate): Secondary | ICD-10-CM

## 2016-01-24 LAB — POCT GLYCOSYLATED HEMOGLOBIN (HGB A1C): HEMOGLOBIN A1C: 9.6

## 2016-01-24 NOTE — Addendum Note (Signed)
Addended by: BAILEY, MEGAN T on: 01/24/2016 09:07 AM   Modules accepted: Orders  

## 2016-01-24 NOTE — Patient Instructions (Signed)
check your blood sugar 4 times a day: before the 3 meals, and at bedtime.  also check if you have symptoms of your blood sugar being too high or too low.  please keep a record of the readings and bring it to your next appointment here.  You can write it on any piece of paper.  please call us sooner if your blood sugar goes below 70, or if you have a lot of readings over 200.  Also, please write on your record why you think it is higher or lower: this is the next step in getting your blood sugar better. Please continue the same insulin: 38 units with breakfast (30 if you are going to be active), and 22 units with the evening meal.   Please come back for a follow-up appointment in 3 months.

## 2016-01-24 NOTE — Progress Notes (Signed)
Subjective:    Patient ID: Henry Less., male    DOB: 07-07-1941, 75 y.o.   MRN: [id number]  HPI Pt returns for f/u of diabetes mellitus: DM type: 1 Dx'ed: 1961 Complications: renal insufficiency, CAD, PAD, and retinopathy. Therapy: insulin since dx.  DKA: never.   Severe hypoglycemia: last episode was approx 2008.   Pancreatitis: never.  Other: after poor results with multiple daily injections, in mid-2015, he was changed to 70/30; he has declined pump and continuous glucose monitor; pt says he cannot afford insulin analogs.   Interval history: he brings a record of his cbg's which i have reviewed today.  It varies from 83-282.  There is no trend throughout the day.   Past Medical History  Diagnosis Date  . ANEMIA-NOS 07/10/2007  . CORONARY ARTERY DISEASE 07/10/2007  . DIABETES MELLITUS, TYPE I 07/10/2007  . HYPERLIPIDEMIA 07/10/2007  . HYPERTENSION 07/10/2007  . RENAL INSUFFICIENCY 07/10/2007  . 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 Peripheral arterial disease [facility])     Past Surgical History  Procedure Laterality Date  . 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   Marland Kitchen Appendectomy    . Carotid endarterectomy Right     right  . Cardiac catheterization      stenting in the vein graft to the right coronary artery and had_DES 3x 26 mm Integrity stent inserted, post dilated to 3.4 mm    Social History   Social History  . Marital Status: Married    Spouse Name: N/A  . Number of Children: N/A  . Years of Education: N/A   Occupational History  . Not on file.   Social History Main Topics  . Smoking status: Former Games developer  . Smokeless tobacco: Never Used  . Alcohol Use: No  . Drug Use: No  . Sexual Activity: Not on file   Other Topics Concern  . Not on file   Social History Narrative   Says his diet and exercise are very good    Current Outpatient Prescriptions on File Prior to Visit  Medication Sig Dispense  Refill  . acetaminophen (TYLENOL) 500 MG tablet Take 500 mg by mouth daily as needed (pain).    Marland Kitchen aspirin EC 81 MG tablet Take 81 mg by mouth daily.    . B Complex-C-Folic Acid (FOLBEE PLUS) TABS Take 1 tablet by mouth daily.      . Cholecalciferol (VITAMIN D) 2000 UNITS CAPS Take 2,000 Units by mouth daily.     Marland Kitchen gabapentin (NEURONTIN) 100 MG capsule Take 100 mg by mouth 2 (two) times daily.     . hydrochlorothiazide (MICROZIDE) 12.5 MG capsule Take 12.5 mg by mouth daily.    . insulin NPH-regular Human (NOVOLIN 70/30) (70-30) 100 UNIT/ML injection 38 units with breakfast, and 22 units with the evening meal (Patient taking differently: 2 (two) times daily before a meal. Inject 38 units before breakfast and 22 units before supper) 20 mL 11  . isosorbide mononitrate (IMDUR) 60 MG 24 hr tablet Take 1 tablet (60 mg total) by mouth daily. NEEDS APPOINTMENT FOR FUTURE REFILLS 30 tablet 0  . labetalol (NORMODYNE) 200 MG tablet Take 100-200 mg by mouth 2 (two) times daily. Take 1/2 tablet (100 mg) every morning and 1 tablet (200 mg) every night    . omeprazole (PRILOSEC OTC) 20 MG tablet Take 20 mg by mouth daily.     No current facility-administered medications on file prior to  visit.    Allergies  Allergen Reactions  . Hydralazine Swelling    Feet swelled so bad they were cracking Severe foot swelling  . Penicillins Other (See Comments)    Per allergy test.  . Amlodipine Swelling  . Amlodipine Besylate Swelling  . Codeine Other (See Comments)    anxiety  . Lipitor [Atorvastatin Calcium] Other (See Comments)    myalgia  . Valsartan Other (See Comments) and Swelling    Caused kidney damage  . Penicillin G Rash    Family History  Problem Relation Age of Onset  . Cancer Neg Hx   . Heart disease Mother     BP 137/70 mmHg  Pulse 70  Temp(Src) 97.9 F (36.6 C) (Oral)  Ht  (1.778 m)  Wt 158 lb (71.668 kg)  BMI 22.67 kg/m2  SpO2 97%  Review of Systems He denies hypoglycemia.       Objective:   Physical Exam VITAL SIGNS:  See vs page GENERAL: no distress Pulses: dorsalis pedis intact bilat.  Skin: no ulcer on the feet. feet are of normal temp. There is patchy hyperpigmentation There is an old healed vein harvest scar at the left leg.  Feet: no deformity. no leg edema.  There is bilateral onychomycosis.  Neuro: sensation is intact to touch on the feet, but decreased from normal.   A1c=9.6%    Assessment & Plan:  DM: the next step is to understand the fluctuations in his cbg's.   Patient is advised the following: Patient Instructions  check your blood sugar 4 times a day: before the 3 meals, and at bedtime.  also check if you have symptoms of your blood sugar being too high or too low.  please keep a record of the readings and bring it to your next appointment here.  You can write it on any piece of paper.  please call us sooner if your blood sugar goes below 70, or if you have a lot of readings over 200.  Also, please write on your record why you think it is higher or lower: this is the next step in getting your blood sugar better. Please continue the same insulin: 38 units with breakfast (30 if you are going to be active), and 22 units with the evening meal.   Please come back for a follow-up appointment in 3 months.

## 2016-02-03 ENCOUNTER — Telehealth: Payer: Self-pay | Admitting: Endocrinology

## 2016-02-03 NOTE — Telephone Encounter (Signed)
Pt notified of note below. Pt stated his has been using tylenol and has noticed his symptoms clearing up. Pt will call back if he feels the need for an appointment.

## 2016-02-03 NOTE — Telephone Encounter (Signed)
He is getting a cold and has a fever please advise what could be best for him to take

## 2016-02-03 NOTE — Telephone Encounter (Signed)
These are OK: claritin-D Robitussin-DM Tylenol Ov tomorrow if you wish

## 2016-02-03 NOTE — Telephone Encounter (Signed)
See note below and please advise, Thanks! 

## 2016-02-09 ENCOUNTER — Encounter: Payer: Self-pay | Admitting: Podiatry

## 2016-02-09 ENCOUNTER — Ambulatory Visit (INDEPENDENT_AMBULATORY_CARE_PROVIDER_SITE_OTHER): Payer: Medicare Other | Admitting: Podiatry

## 2016-02-09 DIAGNOSIS — M79676 Pain in unspecified toe(s): Secondary | ICD-10-CM | POA: Diagnosis not present

## 2016-02-09 DIAGNOSIS — B351 Tinea unguium: Secondary | ICD-10-CM | POA: Diagnosis not present

## 2016-02-09 DIAGNOSIS — E1151 Type 2 diabetes mellitus with diabetic peripheral angiopathy without gangrene: Secondary | ICD-10-CM | POA: Diagnosis not present

## 2016-02-09 NOTE — Progress Notes (Signed)
Patient ID: Henry [name]., male   DOB: 1941/12/10, 75 y.o.   MRN: [id number]  Subjective: This patient presents today for scheduled visit complaining of toenails that are elongated and thickened cough walking wearing shoes request toenail debridement.  Objective: Orientated 3 No open skin lesions bilaterally Tailor bunion with overlying mild callus that do not need debridement today, bilaterally The toenails are hypertrophic, incurvated, discolored, deformity tender direct palpation 6-10  Assessment: Symptomatic onychomycoses 6-10 Diabetic with a history of peripheral arterial disease  Plan: Debridement toenails 6-10 mechanically and electrically without any bleeding  Reappoint 10 weeks

## 2016-02-19 ENCOUNTER — Other Ambulatory Visit: Payer: Self-pay | Admitting: Cardiovascular Disease

## 2016-02-21 NOTE — Telephone Encounter (Signed)
Rx(s) sent to pharmacy electronically.  

## 2016-03-02 DIAGNOSIS — E11311 Type 2 diabetes mellitus with unspecified diabetic retinopathy with macular edema: Secondary | ICD-10-CM | POA: Diagnosis not present

## 2016-03-02 DIAGNOSIS — E103519 Type 1 diabetes mellitus with proliferative diabetic retinopathy with macular edema, unspecified eye: Secondary | ICD-10-CM | POA: Diagnosis not present

## 2016-03-02 DIAGNOSIS — Z961 Presence of intraocular lens: Secondary | ICD-10-CM | POA: Diagnosis not present

## 2016-03-09 ENCOUNTER — Telehealth: Payer: Self-pay | Admitting: Endocrinology

## 2016-03-09 DIAGNOSIS — H40033 Anatomical narrow angle, bilateral: Secondary | ICD-10-CM | POA: Diagnosis not present

## 2016-03-09 DIAGNOSIS — E119 Type 2 diabetes mellitus without complications: Secondary | ICD-10-CM | POA: Diagnosis not present

## 2016-03-09 NOTE — Telephone Encounter (Signed)
The form has not yet been scanned. Please drop off the form, and I will look at it. Please advise pt not to wait in the office for the form to be done.  we'll call him when the form is done.

## 2016-03-09 NOTE — Telephone Encounter (Signed)
Please read message below and advise.  

## 2016-03-09 NOTE — Telephone Encounter (Signed)
Pt called in about a [employer] form that was filled out and wants to know if he can just come by tomorrow because there was a box that was unchecked.

## 2016-03-10 ENCOUNTER — Telehealth: Payer: Self-pay | Admitting: Cardiovascular Disease

## 2016-03-10 NOTE — Telephone Encounter (Signed)
Please call,have some questions about something in his records that was not accurate.

## 2016-03-10 NOTE — Telephone Encounter (Signed)
Returned pt call. Clarified concerns. He has a documented history of PVCs. Notes he does not recall discussion about this. Notes he wore an event monitor in the past and the results were "ok".  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 Pt aware he is overdue for appt to see Dr. Tresa EndoKelly, would probably be best to discuss in office. He is set up to see Dr. Tresa EndoKelly in May.  I put pt on waitlist for sooner appt at his request. Advised he is welcome to contact our office and/or bring by paperwork at any time for Dr. Tresa EndoKelly to review.

## 2016-03-10 NOTE — Telephone Encounter (Signed)
I contacted the pt. And advised the pt Dr. Everardo AllEllison circled arrhythmia's on his DMV due to the diagnosis placed on his chart by his cardiologist. Pt will contact the cardiologist for further questions.

## 2016-03-22 DIAGNOSIS — H43393 Other vitreous opacities, bilateral: Secondary | ICD-10-CM | POA: Diagnosis not present

## 2016-03-29 ENCOUNTER — Other Ambulatory Visit: Payer: Self-pay | Admitting: Cardiovascular Disease

## 2016-03-30 NOTE — Telephone Encounter (Signed)
Rx(s) sent to pharmacy electronically.  

## 2016-04-19 ENCOUNTER — Encounter: Payer: Self-pay | Admitting: Podiatry

## 2016-04-19 ENCOUNTER — Ambulatory Visit (INDEPENDENT_AMBULATORY_CARE_PROVIDER_SITE_OTHER): Payer: Medicare Other | Admitting: Podiatry

## 2016-04-19 DIAGNOSIS — B351 Tinea unguium: Secondary | ICD-10-CM | POA: Diagnosis not present

## 2016-04-19 DIAGNOSIS — M79676 Pain in unspecified toe(s): Secondary | ICD-10-CM | POA: Diagnosis not present

## 2016-04-19 NOTE — Patient Instructions (Signed)
Diabetes and Foot Care Diabetes may cause you to have problems because of poor blood supply (circulation) to your feet and legs. This may cause the skin on your feet to become thinner, break easier, and heal more slowly. Your skin may become dry, and the skin may peel and crack. You may also have nerve damage in your legs and feet causing decreased feeling in them. You may not notice minor injuries to your feet that could lead to infections or more serious problems. Taking care of your feet is one of the most important things you can do for yourself.  HOME CARE INSTRUCTIONS  Wear shoes at all times, even in the house. Do not go barefoot. Bare feet are easily injured.  Check your feet daily for blisters, cuts, and redness. If you cannot see the bottom of your feet, use a mirror or ask someone for help.  Wash your feet with warm water (do not use hot water) and mild soap. Then pat your feet and the areas between your toes until they are completely dry. Do not soak your feet as this can dry your skin.  Apply a moisturizing lotion or petroleum jelly (that does not contain alcohol and is unscented) to the skin on your feet and to dry, brittle toenails. Do not apply lotion between your toes.  Trim your toenails straight across. Do not dig under them or around the cuticle. File the edges of your nails with an emery board or nail file.  Do not cut corns or calluses or try to remove them with medicine.  Wear clean socks or stockings every day. Make sure they are not too tight. Do not wear knee-high stockings since they may decrease blood flow to your legs.  Wear shoes that fit properly and have enough cushioning. To break in new shoes, wear them for just a few hours a day. This prevents you from injuring your feet. Always look in your shoes before you put them on to be sure there are no objects inside.  Do not cross your legs. This may decrease the blood flow to your feet.  If you find a minor scrape,  cut, or break in the skin on your feet, keep it and the skin around it clean and dry. These areas may be cleansed with mild soap and water. Do not cleanse the area with peroxide, alcohol, or iodine.  When you remove an adhesive bandage, be sure not to damage the skin around it.  If you have a wound, look at it several times a day to make sure it is healing.  Do not use heating pads or hot water bottles. They may burn your skin. If you have lost feeling in your feet or legs, you may not know it is happening until it is too late.  Make sure your health care provider performs a complete foot exam at least annually or more often if you have foot problems. Report any cuts, sores, or bruises to your health care provider immediately. SEEK MEDICAL CARE IF:   You have an injury that is not healing.  You have cuts or breaks in the skin.  You have an ingrown nail.  You notice redness on your legs or feet.  You feel burning or tingling in your legs or feet.  You have pain or cramps in your legs and feet.  Your legs or feet are numb.  Your feet always feel cold. SEEK IMMEDIATE MEDICAL CARE IF:   There is increasing redness,   swelling, or pain in or around a wound.  There is a red line that goes up your leg.  Pus is coming from a wound.  You develop a fever or as directed by your health care provider.  You notice a bad smell coming from an ulcer or wound.   This information is not intended to replace advice given to you by your health care provider. Make sure you discuss any questions you have with your health care provider.   Document Released: 12/08/2000 Document Revised: 08/13/2013 Document Reviewed: 05/20/2013 Elsevier Interactive Patient Education 2016 [employer]  

## 2016-04-19 NOTE — Progress Notes (Signed)
Patient ID: Henry LessGlenn T Anton Jr., male   DOB: 11-19-1941, 75 y.o.   MRN: [id number]  Subjective: This patient presents today for scheduled visit complaining of toenails that are elongated and thickened cough walking wearing shoes request toenail debridement.  Objective: Orientated 3 No open skin lesions bilaterally Tailor bunion with overlying mild callus that do not need debridement today, bilaterally The toenails are hypertrophic, incurvated, discolored, deformity tender direct palpation 6-10  Assessment: Symptomatic onychomycoses 6-10 Diabetic with a history of peripheral arterial disease  Plan: Debridement toenails 6-10 mechanically and electrically without any bleeding  Reappoint 10 weeks

## 2016-04-21 ENCOUNTER — Ambulatory Visit (INDEPENDENT_AMBULATORY_CARE_PROVIDER_SITE_OTHER): Payer: Medicare Other | Admitting: Endocrinology

## 2016-04-21 VITALS — BP 134/74 | HR 66 | Temp 98.0°F | Ht 70.0 in | Wt 160.0 lb

## 2016-04-21 DIAGNOSIS — E1022 Type 1 diabetes mellitus with diabetic chronic kidney disease: Secondary | ICD-10-CM | POA: Diagnosis not present

## 2016-04-21 DIAGNOSIS — N189 Chronic kidney disease, unspecified: Secondary | ICD-10-CM

## 2016-04-21 LAB — POCT GLYCOSYLATED HEMOGLOBIN (HGB A1C): Hemoglobin A1C: 11

## 2016-04-21 MED ORDER — INSULIN NPH ISOPHANE & REGULAR (70-30) 100 UNIT/ML ~~LOC~~ SUSP: SUBCUTANEOUS | Status: DC

## 2016-04-21 NOTE — Progress Notes (Signed)
Subjective:    Patient ID: Henry LessGlenn T Wyly Jr., male    DOB: 01-04-1941, 75 y.o.   MRN: [id number]  HPI Pt returns for f/u of diabetes mellitus: DM type: 1 Dx'ed: 1961 Complications: renal insufficiency, CAD, PAD, and retinopathy. Therapy: insulin since dx.  DKA: never.   Severe hypoglycemia: last episode was approx 2008.   Pancreatitis: never.  Other: after poor results with multiple daily injections, in mid-2015, he was changed to 70/30; he has declined pump and continuous glucose monitor; pt says he cannot afford insulin analogs.   Interval history: he brings a record of his cbg's which i have reviewed today.  It varies from 70-200's.  It is most consistently high at hs and in am.  It is lowest on Tuesdays, due to physical activity at a church function.  This is despite decreasing to 30 units.  In addition, it is lower when he is active, despite taking only 30 units.   Past Medical History  Diagnosis Date  . ANEMIA-NOS 07/10/2007  . CORONARY ARTERY DISEASE 07/10/2007  . DIABETES MELLITUS, TYPE I 07/10/2007  . HYPERLIPIDEMIA 07/10/2007  . HYPERTENSION 07/10/2007  . RENAL INSUFFICIENCY 07/10/2007  . 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 Peripheral arterial disease [facility](HCC)     Past Surgical History  Procedure Laterality Date  . 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   Marland Kitchen. Appendectomy    . Carotid endarterectomy Right     right  . Cardiac catheterization      stenting in the vein graft to the right coronary artery and had_DES 3x 26 mm Integrity stent inserted, post dilated to 3.4 mm    Social History   Social History  . Marital Status: Married    Spouse Name: N/A  . Number of Children: N/A  . Years of Education: N/A   Occupational History  . Not on file.   Social History Main Topics  . Smoking status: Former Games developermoker  . Smokeless tobacco: Never Used  . Alcohol Use: No  . Drug Use: No  . Sexual Activity: Not on file    Other Topics Concern  . Not on file   Social History Narrative   Says his diet and exercise are very good    Current Outpatient Prescriptions on File Prior to Visit  Medication Sig Dispense Refill  . acetaminophen (TYLENOL) 500 MG tablet Take 500 mg by mouth daily as needed (pain).    Marland Kitchen. aspirin EC 81 MG tablet Take 81 mg by mouth daily.    . B Complex-C-Folic Acid (FOLBEE PLUS) TABS Take 1 tablet by mouth daily.      . Cholecalciferol (VITAMIN D) 2000 UNITS CAPS Take 2,000 Units by mouth daily.     Marland Kitchen. gabapentin (NEURONTIN) 100 MG capsule Take 100 mg by mouth 2 (two) times daily.     . hydrochlorothiazide (MICROZIDE) 12.5 MG capsule Take 12.5 mg by mouth daily.    . isosorbide mononitrate (IMDUR) 60 MG 24 hr tablet TAKE 1 TABLET BY MOUTH DAILY 30 tablet 1  . labetalol (NORMODYNE) 200 MG tablet Take 100-200 mg by mouth 2 (two) times daily. Take 1/2 tablet (100 mg) every morning and 1 tablet (200 mg) every night    . omeprazole (PRILOSEC OTC) 20 MG tablet Take 20 mg by mouth daily.     No current facility-administered medications on file prior to visit.    Allergies  Allergen Reactions  . Hydralazine Swelling  Feet swelled so bad they were cracking Severe foot swelling  . Penicillins Other (See Comments)    Per allergy test.  . Amlodipine Swelling  . Amlodipine Besylate Swelling  . Codeine Other (See Comments)    anxiety  . Lipitor [Atorvastatin Calcium] Other (See Comments)    myalgia  . Valsartan Other (See Comments) and Swelling    Caused kidney damage  . Penicillin G Rash    Family History  Problem Relation Age of Onset  . Cancer Neg Hx   . Heart disease Mother     BP 134/74 mmHg  Pulse 66  Temp(Src) 98 F (36.7 C) (Oral)  Ht  (1.778 m)  Wt 160 lb (72.576 kg)  BMI 22.96 kg/m2  SpO2 96%  Review of Systems Denies LOC    Objective:   Physical Exam VITAL SIGNS:  See vs page GENERAL: no distress Pulses: dorsalis pedis intact bilat.  Skin: no  ulcer on the feet.  feet are of normal temp. There is patchy hyperpigmentation There is an old healed vein harvest scar at the left leg.  Feet: no deformity. There is bilateral onychomycosis.  CV: no leg edema. Neuro: sensation is intact to touch on the feet, but decreased from normal.    Lab Results  Component Value Date   HGBA1C 11.0 04/21/2016      Assessment & Plan:  DM: severe exacerbation.   Patient is advised the following: Patient Instructions  check your blood sugar 4 times a day: before the 3 meals, and at bedtime.  also check if you have symptoms of your blood sugar being too high or too low.  please keep a record of the readings and bring it to your next appointment here.  You can write it on any piece of paper.  please call us sooner if your blood sugar goes below 70, or if you have a lot of readings over 200.  Also, please write on your record why you think it is higher or lower: this is the next step in getting your blood sugar better. Please continue the same insulin: 38 units with breakfast (25 if you are going to be active), and 25 units with the evening meal.   Please come back for a follow-up appointment in 3 months.

## 2016-04-21 NOTE — Patient Instructions (Addendum)
check your blood sugar 4 times a day: before the 3 meals, and at bedtime.  also check if you have symptoms of your blood sugar being too high or too low.  please keep a record of the readings and bring it to your next appointment here.  You can write it on any piece of paper.  please call us sooner if your blood sugar goes below 70, or if you have a lot of readings over 200.  Also, please write on your record why you think it is higher or lower: this is the next step in getting your blood sugar better. Please continue the same insulin: 38 units with breakfast (25 if you are going to be active), and 25 units with the evening meal.   Please come back for a follow-up appointment in 3 months.

## 2016-05-04 ENCOUNTER — Encounter: Payer: Self-pay | Admitting: Cardiovascular Disease

## 2016-05-04 ENCOUNTER — Ambulatory Visit (INDEPENDENT_AMBULATORY_CARE_PROVIDER_SITE_OTHER): Payer: Medicare Other | Admitting: Cardiovascular Disease

## 2016-05-04 VITALS — BP 207/71 | HR 64 | Ht 70.0 in | Wt 162.2 lb

## 2016-05-04 DIAGNOSIS — I10 Essential (primary) hypertension: Secondary | ICD-10-CM | POA: Diagnosis not present

## 2016-05-04 DIAGNOSIS — I739 Peripheral vascular disease, unspecified: Secondary | ICD-10-CM

## 2016-05-04 DIAGNOSIS — R0989 Other specified symptoms and signs involving the circulatory and respiratory systems: Secondary | ICD-10-CM

## 2016-05-04 DIAGNOSIS — N183 Chronic kidney disease, stage 3 (moderate): Secondary | ICD-10-CM

## 2016-05-04 DIAGNOSIS — I2510 Atherosclerotic heart disease of native coronary artery without angina pectoris: Secondary | ICD-10-CM | POA: Diagnosis not present

## 2016-05-04 DIAGNOSIS — Z79899 Other long term (current) drug therapy: Secondary | ICD-10-CM

## 2016-05-04 DIAGNOSIS — E785 Hyperlipidemia, unspecified: Secondary | ICD-10-CM

## 2016-05-04 DIAGNOSIS — E1022 Type 1 diabetes mellitus with diabetic chronic kidney disease: Secondary | ICD-10-CM

## 2016-05-04 DIAGNOSIS — E119 Type 2 diabetes mellitus without complications: Secondary | ICD-10-CM | POA: Diagnosis not present

## 2016-05-04 DIAGNOSIS — E1065 Type 1 diabetes mellitus with hyperglycemia: Secondary | ICD-10-CM

## 2016-05-04 DIAGNOSIS — IMO0002 Reserved for concepts with insufficient information to code with codable children: Secondary | ICD-10-CM

## 2016-05-04 MED ORDER — LABETALOL HCL 200 MG PO TABS: 200.0000 mg | ORAL_TABLET | Freq: Two times a day (BID) | ORAL | Status: DC

## 2016-05-04 NOTE — Patient Instructions (Signed)
Your physician has recommended you make the following change in your medication: labetalol has been increased to 200 mg twice a day.  Your physician has requested that you have an echocardiogram. Echocardiography is a painless test that uses sound waves to create images of your heart. It provides your doctor with information about the size and shape of your heart and how well your heart's chambers and valves are working. This procedure takes approximately one hour. There are no restrictions for this procedure.  Your physician has requested that you have a carotid duplex. This test is an ultrasound of the carotid arteries in your neck. It looks at blood flow through these arteries that supply the brain with blood. Allow one hour for this exam. There are no restrictions or special instructions.  Your physician has requested that you have a lexiscan myoview. For further information please visit [email or web address]. Please follow instruction sheet, as given.  Your physician recommends that you return for lab work fasting.  Your physician recommends that you schedule a follow-up appointment in: 4-6 weeks.

## 2016-05-06 ENCOUNTER — Encounter: Payer: Self-pay | Admitting: Cardiovascular Disease

## 2016-05-06 NOTE — Progress Notes (Signed)
Patient ID: Henry [name]., male   DOB: 1941-08-16, 75 y.o.   MRN: [id number]      HPI: Henry [name]. is a 75 y.o. male who presents to the office today for a cardiology follow-up evaluation.  I last saw him in October 2015.  Henry [name] has a history of brittle type I diabetes. He has CAD and in April 1999 suffered an inferior wall myocardial infarction which initially was complicated by third-degree heart block in the setting of his MI. He underwent PTCA of his RCA. Due to the extensive CAD he ultimately underwent elective CABG surgery. He also has peripheral vascular disease and is status post right carotid endarterectomy. At catheterization March 2011 he was [name] to have 95% stenosis in the graft supplying his RCA and underwent successful stenting with a 3.0x26 mm integrity stent. A bare metal stent was used in light of his significant diabetic retinopathy with significant bleeding history to reduce his need for long-term dual antiplatelet therapy. Henry [name] has renal insufficiency and is followed by Henry [name]. Laboratory in 2013 showed a creatinine of 1.32.   Henry [name] has experienced several episodes of dizziness and has been felt to have an autonomic neuropathy. He denies frank syncope. Denies recurrent episodes of chest pain. He denies significant shortness of breath.  He tells me recently, his sugars have been very difficult to control. Recent hemoglobin A1c checked by Henry [name] was elevated at 11.4.Henry [name] seems to develop the beginning of right foot paresthesias but most likely her peripheral neuropathy.  In 2015 I recommended a titration of his labetolol for suboptimal blood pressure control, as well as ectopy.  He did have occasional PVCs, predominately unifocal.  However, he did have several episodes of 4 beats of ventricular tachycardia at a rate of 114 and then another episode of 7 beats of nonsustained ventricular tachycardia.   He has had issues with dizziness in the  past resulting in medication reduction.  He is felt to have a peripheral neuropathy and is now on gabapentin.  Remotely, he had developed significant leg edema with amlodipine.  He was unable to tolerate ACE inhibition or ARB therapy.  There also was a possibility of hydralazine-induced leg swelling back in 2011.  He has peripheral vascular disease and is status post right carotid endarterectomy.  Since I last saw him, he was diagnosed with significant PVD with an occluded anterior tibial and posterior tibial arteries on the right, early high-grade left SFA stenosis with an occluded left breast posterior tibial artery.  He has had chronic claudication symptoms but these do not appear to be significantly progressing.  Henry [name] seen Henry [name].  Remotely was told that no further therapy with regards intervention was warranted.  Presently, his legs give out as result of his claudication.  He denies any anginal symptoms.  He denies any significant shortness of breath.  He states blood pressure at home typically is in the 150-160 range.  He presents for reevaluation.  Past Medical History  Diagnosis Date  . ANEMIA-NOS 07/10/2007  . CORONARY ARTERY DISEASE 07/10/2007  . DIABETES MELLITUS, TYPE I 07/10/2007  . HYPERLIPIDEMIA 07/10/2007  . HYPERTENSION 07/10/2007  . RENAL INSUFFICIENCY 07/10/2007  . 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 Peripheral arterial disease [facility])     Past Surgical History  Procedure Laterality Date  . 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   Marland Kitchen Appendectomy    . Carotid endarterectomy  Right     right  . Cardiac catheterization      stenting in the vein graft to the right coronary artery and had_DES 3x 26 mm Integrity stent inserted, post dilated to 3.4 mm    Allergies  Allergen Reactions  . Hydralazine Swelling    Feet swelled so bad they were cracking Severe foot swelling  . Penicillins Other (See Comments)    Per allergy  test.  . Amlodipine Swelling  . Amlodipine Besylate Swelling  . Codeine Other (See Comments)    anxiety  . Lipitor [Atorvastatin Calcium] Other (See Comments)    myalgia  . Valsartan Other (See Comments) and Swelling    Caused kidney damage  . Penicillin G Rash    Current Outpatient Prescriptions  Medication Sig Dispense Refill  . acetaminophen (TYLENOL) 500 MG tablet Take 500 mg by mouth daily as needed (pain).    Marland Kitchen aspirin EC 81 MG tablet Take 81 mg by mouth daily.    . B Complex-C-Folic Acid (FOLBEE PLUS) TABS Take 1 tablet by mouth daily.      . Cholecalciferol (VITAMIN D) 2000 UNITS CAPS Take 2,000 Units by mouth daily.     Marland Kitchen gabapentin (NEURONTIN) 100 MG capsule Take 100 mg by mouth 2 (two) times daily.     . hydrochlorothiazide (MICROZIDE) 12.5 MG capsule Take 12.5 mg by mouth daily.    . insulin NPH-regular Human (NOVOLIN 70/30) (70-30) 100 UNIT/ML injection 38 units with breakfast and 25 units with supper 20 mL 11  . isosorbide mononitrate (IMDUR) 60 MG 24 hr tablet TAKE 1 TABLET BY MOUTH DAILY 30 tablet 1  . omeprazole (PRILOSEC OTC) 20 MG tablet Take 20 mg by mouth daily.    Marland Kitchen labetalol (NORMODYNE) 200 MG tablet Take 1 tablet (200 mg total) by mouth 2 (two) times daily. 60 tablet 11   No current facility-administered medications for this visit.    Socially he is married has 2 children and 2 grandchildren. No tobacco history or alcohol use. He is not routinely exercise.   ROS General: Negative; No fevers, chills, or night sweats;  HEENT: He is status post multiple laser procedures for diabetic retinopathy., sinus congestion, difficulty swallowing Pulmonary: Negative; No cough, wheezing, shortness of breath, hemoptysis Cardiovascular: Negative; No chest pain, presyncope, syncope, palpitations Positive for claudication symptoms  GI: Negative; No nausea, vomiting, diarrhea, or abdominal pain GU: Positive for mild renal insufficiency; No dysuria, hematuria, or difficulty  voiding Musculoskeletal: Negative; no myalgias, joint pain, or weakness Hematologic/Oncology: Negative; no easy bruising, bleeding Endocrine: Positive for brittle diabetes no heat/cold intolerance;  Neuro: Positive for neuropathy with paresthesias; no changes in balance, headaches Skin: Negative; No rashes or skin lesions Psychiatric: Negative; No behavioral problems, depression Sleep: Negative; No snoring, daytime sleepiness, hypersomnolence, bruxism, restless legs, hypnogognic hallucinations, no cataplexy Other comprehensive 14 point system review is negative.   PE BP 207/71 mmHg  Pulse 64  Ht _0  (1.778 m)  Wt 162 lb 3.2 oz (73.573 kg)  BMI 23.27 kg/m2   Repeat BP by me was 168/78 without significant orthostatic drop.  Wt Readings from Last 3 Encounters:  05/04/16 162 lb 3.2 oz (73.573 kg)  04/21/16 160 lb (72.576 kg)  01/24/16 158 lb (71.668 kg)   General: Alert, oriented, no distress.  Skin: normal turgor, no rashes HEENT: Normocephalic, atraumatic. Pupils round and reactive; sclera anicteric;no lid lag.  Nose without nasal septal hypertrophy no xanthelasmas Mouth/Parynx benign; Mallinpatti scale 2 Neck: No JVD; he is status  post right carotid endarterectomy. He does have  bilateral bruits left greater than right. Lungs: clear to ausculatation and percussion; no wheezing or rales Chest wall: Nontender to palpation Heart: Regular rate and rhythm in the 60s., s1 s2 normal 2/6 systolic murmur with a diastolic murmur. Abdomen: soft, nontender; no hepatosplenomehaly, BS+; abdominal aorta nontender and not dilated by palpation. No renal artery bruits. Pulses: Diminished pulses.  Lower extremity Extremities: no clubbing cyanosis or edema, Homan's sign negative  Neurologic: grossly nonfocal Psychological: Normal affect and mood  ECG (independently read by me): Normal sinus rhythm at 64 bpm.  Inferolateral ST abnormality.  October 2015 ECG (independently read by me): Normal  sinus rhythm at 63 beats per minute.  Nonspecific ST changes.  Prior ECG (independently read by me): Sinus rhythm at 59 beats per minute.  Nonspecific ST changes.  Prior 02/04/2014 ECG  (independently read by me): Sinus rhythm at 68 beats per minute with frequent PVCs unifocal.  Prior ECG of July 2014: Sinus rhythm at 64 beats per minute. QTc interval 400 ms. One isolated PAC. Small nondiagnostic Q waves inferiorly with preserved R waves.  LABS:  BMP Latest Ref Rng 09/23/2015 07/16/2015 02/04/2014  Glucose 70 - 99 mg/dL 218(H) 96 313(H)  BUN 6 - 23 mg/dL 32(H) 35(H) 33(H)  Creatinine 0.40 - 1.50 mg/dL 1.47 1.60(H) 1.35  Sodium 135 - 145 mEq/L 140 141 137  Potassium 3.5 - 5.1 mEq/L 4.2 3.4(L) 4.6  Chloride 96 - 112 mEq/L 107 114(H) 103  CO2 19 - 32 mEq/L _0 Calcium 8.4 - 10.5 mg/dL 9.5 9.6 9.9   Hepatic Function Latest Ref Rng 09/23/2015 02/04/2014 06/13/2013  Total Protein 6.0 - 8.3 g/dL 6.8 6.9 6.8  Albumin 3.5 - 5.2 g/dL 3.9 4.1 3.8  AST 0 - 37 U/L _1 ALT 0 - 53 U/L _2 Alk Phosphatase 39 - 117 U/L 66 78 68  Total Bilirubin 0.2 - 1.2 mg/dL 0.7 0.4 0.8  Bilirubin, Direct 0.0 - 0.3 mg/dL 0.1 - 0.1   CBC Latest Ref Rng 09/23/2015 07/16/2015 08/27/2014  WBC 4.0 - 10.5 K/uL 6.9 8.6 6.6  Hemoglobin 13.0 - 17.0 g/dL 11.5(L) 11.6(L) 11.4(L)  Hematocrit 39.0 - 52.0 % 34.4(L) 34.4(L) 34.6(L)  Platelets 150.0 - 400.0 K/uL 245.0 226 248.0   Lab Results  Component Value Date   MCV 91.3 09/23/2015   MCV 90.5 07/16/2015   MCV 91.4 08/27/2014    Lab Results  Component Value Date   TSH 2.56 09/23/2015   Lab Results  Component Value Date   HGBA1C 11.0 04/21/2016   Lipid Panel     Component Value Date/Time   CHOL 158 09/23/2015 0958   TRIG 71.0 09/23/2015 0958   HDL 45.00 09/23/2015 0958   CHOLHDL 4 09/23/2015 0958   VLDL 14.2 09/23/2015 0958   LDLCALC 99 09/23/2015 0958     RADIOLOGY: No results [name].    ASSESSMENT AND PLAN: Mr. Goeden Is a 75 year old  Caucasian male who has a history of type 1 diabetes mellitus which has been brittle over the years and has documented nephropathy and retinopathy.  He has significant cardiovascular issues and suffered inferior wall MI, gated by third-degree heart block in 1999 which was treated successfully with PTCA with resultant significant myocardial salvage. Due to concomitant CAD he underwent CABG surgery with a LIMA to his LAD, vein to diagonal, sequential vein to OM1 and distal circumflex, and SVG to his right coronary artery. He status  post stenting of the proximal portion of the vein graft to his right coronary artery in 2011. His last nuclear perfusion study in November 2012 remained low risk and history minimal region of inferior scar. His last carotid studies were June 2013 which showed mild plaque without significant stenoses.  His last echo Doppler study in February 2014 showed an ejection fraction of 50-55%; mitral annular calcification with mild MR. His right ventricle was dilated. He did have mild TR and mild MR. 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   He has peripheral vascular disease and is status post right carotid endarterectomy.  He also has claudication symptomatology bilaterally in his lower extremity with documented significant occlusion of both the anterior tibial and posterior tibial arteries on the right and has left SFA stenosis with occluded left posterior tibial artery on the left.  His blood pressure today is elevated, but he has been intolerant to numerous medications.  I will attempt to slightly increase his labetolol to 200 mg twice a day.  I am scheduling him for follow-up carotid Doppler imaging, a 2-D echo Doppler study to reassess his systolic and diastolic function and valvular architecture and in light of his CAD I will also schedule him for a Lexiscan Myoview study since this has not been done in 5 years to reevaluate his myocardial perfusion.  Laboratory  will be obtained in the fasting state.  I will see him back in the office in 4 -6 weeks for reevaluation.  Time spent: 25 minutes  Troy Sine, MD, [facility]  05/06/2016 1:44 PM

## 2016-05-09 DIAGNOSIS — E119 Type 2 diabetes mellitus without complications: Secondary | ICD-10-CM | POA: Diagnosis not present

## 2016-05-09 DIAGNOSIS — I10 Essential (primary) hypertension: Secondary | ICD-10-CM | POA: Diagnosis not present

## 2016-05-09 DIAGNOSIS — I2510 Atherosclerotic heart disease of native coronary artery without angina pectoris: Secondary | ICD-10-CM | POA: Diagnosis not present

## 2016-05-09 DIAGNOSIS — Z79899 Other long term (current) drug therapy: Secondary | ICD-10-CM | POA: Diagnosis not present

## 2016-05-09 LAB — CBC
HCT: 36.4 % — ABNORMAL LOW (ref 38.5–50.0)
Hemoglobin: 11.8 g/dL — ABNORMAL LOW (ref 13.2–17.1)
MCH: 29.7 pg (ref 27.0–33.0)
MCHC: 32.4 g/dL (ref 32.0–36.0)
MCV: 91.7 fL (ref 80.0–100.0)
MPV: 10.3 fL (ref 7.5–12.5)
Platelets: 259 10*3/uL (ref 140–400)
RBC: 3.97 MIL/uL — ABNORMAL LOW (ref 4.20–5.80)
RDW: 14.2 % (ref 11.0–15.0)
WBC: 6.2 10*3/uL (ref 3.8–10.8)

## 2016-05-10 LAB — COMPREHENSIVE METABOLIC PANEL
ALBUMIN: 3.8 g/dL (ref 3.6–5.1)
ALK PHOS: 67 U/L (ref 40–115)
ALT: 24 U/L (ref 9–46)
AST: 27 U/L (ref 10–35)
BILIRUBIN TOTAL: 0.5 mg/dL (ref 0.2–1.2)
BUN: 33 mg/dL — ABNORMAL HIGH (ref 7–25)
CO2: 23 mmol/L (ref 20–31)
CREATININE: 1.3 mg/dL — AB (ref 0.70–1.18)
Calcium: 9.2 mg/dL (ref 8.6–10.3)
Chloride: 106 mmol/L (ref 98–110)
GLUCOSE: 205 mg/dL — AB (ref 65–99)
Potassium: 4.5 mmol/L (ref 3.5–5.3)
SODIUM: 140 mmol/L (ref 135–146)
Total Protein: 6.6 g/dL (ref 6.1–8.1)

## 2016-05-10 LAB — LIPID PANEL
Cholesterol: 162 mg/dL (ref 125–200)
HDL: 45 mg/dL (ref >=40)
LDL Cholesterol: 102 mg/dL (ref <130)
Total CHOL/HDL Ratio: 3.6 Ratio (ref <=5.0)
Triglycerides: 74 mg/dL (ref <150)
VLDL: 15 mg/dL (ref <30)

## 2016-05-10 LAB — TSH: TSH: 3.87 m[IU]/L (ref 0.40–4.50)

## 2016-05-17 ENCOUNTER — Encounter (HOSPITAL_COMMUNITY): Payer: Medicare Other

## 2016-05-18 ENCOUNTER — Telehealth (HOSPITAL_COMMUNITY): Payer: Self-pay

## 2016-05-18 NOTE — Telephone Encounter (Signed)
Encounter complete. 

## 2016-05-23 ENCOUNTER — Ambulatory Visit (HOSPITAL_COMMUNITY)
Admission: RE | Admit: 2016-05-23 | Discharge: 2016-05-23 | Disposition: A | Discharge status: Home / Self Care | Payer: Medicare Other | Source: Ambulatory Visit | Attending: Cardiology | Admitting: Cardiology

## 2016-05-23 DIAGNOSIS — I739 Peripheral vascular disease, unspecified: Secondary | ICD-10-CM | POA: Diagnosis not present

## 2016-05-23 DIAGNOSIS — I779 Disorder of arteries and arterioles, unspecified: Secondary | ICD-10-CM | POA: Insufficient documentation

## 2016-05-23 DIAGNOSIS — I10 Essential (primary) hypertension: Secondary | ICD-10-CM | POA: Insufficient documentation

## 2016-05-23 DIAGNOSIS — R42 Dizziness and giddiness: Secondary | ICD-10-CM | POA: Insufficient documentation

## 2016-05-23 DIAGNOSIS — R9439 Abnormal result of other cardiovascular function study: Secondary | ICD-10-CM | POA: Diagnosis not present

## 2016-05-23 DIAGNOSIS — I2510 Atherosclerotic heart disease of native coronary artery without angina pectoris: Secondary | ICD-10-CM | POA: Insufficient documentation

## 2016-05-23 DIAGNOSIS — E109 Type 1 diabetes mellitus without complications: Secondary | ICD-10-CM | POA: Diagnosis not present

## 2016-05-23 DIAGNOSIS — Z79899 Other long term (current) drug therapy: Secondary | ICD-10-CM | POA: Diagnosis not present

## 2016-05-23 LAB — MYOCARDIAL PERFUSION IMAGING
CHL CUP NUCLEAR SDS: 5
CHL CUP NUCLEAR SRS: 6
LV sys vol: 62 mL
LVDIAVOL: 128 mL (ref 62–150)
Peak HR: 81 {beats}/min
Rest HR: 63 {beats}/min
SSS: 11
TID: 1.16

## 2016-05-23 MED ORDER — REGADENOSON 0.4 MG/5ML IV SOLN
0.4000 mg | Freq: Once | INTRAVENOUS | Status: AC
  Administered 2016-05-23: 0.4 mg via INTRAVENOUS

## 2016-05-23 MED ORDER — TECHNETIUM TC 99M TETROFOSMIN IV KIT
10.2000 | PACK | Freq: Once | INTRAVENOUS | Status: AC | PRN
  Administered 2016-05-23: 10 via INTRAVENOUS
  Filled 2016-05-23: qty 10

## 2016-05-23 MED ORDER — TECHNETIUM TC 99M TETROFOSMIN IV KIT
30.9000 | PACK | Freq: Once | INTRAVENOUS | Status: AC | PRN
  Administered 2016-05-23: 30.9 via INTRAVENOUS
  Filled 2016-05-23: qty 31

## 2016-05-28 ENCOUNTER — Other Ambulatory Visit: Payer: Self-pay | Admitting: Cardiovascular Disease

## 2016-05-31 ENCOUNTER — Ambulatory Visit (HOSPITAL_COMMUNITY)
Admission: RE | Admit: 2016-05-31 | Discharge: 2016-05-31 | Disposition: A | Discharge status: Home / Self Care | Payer: Medicare Other | Source: Ambulatory Visit | Attending: Cardiovascular Disease | Admitting: Cardiovascular Disease

## 2016-05-31 DIAGNOSIS — I119 Hypertensive heart disease without heart failure: Secondary | ICD-10-CM | POA: Diagnosis not present

## 2016-05-31 DIAGNOSIS — I10 Essential (primary) hypertension: Secondary | ICD-10-CM

## 2016-05-31 DIAGNOSIS — I6523 Occlusion and stenosis of bilateral carotid arteries: Secondary | ICD-10-CM | POA: Insufficient documentation

## 2016-05-31 DIAGNOSIS — I270 Primary pulmonary hypertension: Secondary | ICD-10-CM | POA: Diagnosis not present

## 2016-05-31 DIAGNOSIS — E785 Hyperlipidemia, unspecified: Secondary | ICD-10-CM | POA: Insufficient documentation

## 2016-05-31 DIAGNOSIS — I340 Nonrheumatic mitral (valve) insufficiency: Secondary | ICD-10-CM | POA: Insufficient documentation

## 2016-05-31 DIAGNOSIS — R0989 Other specified symptoms and signs involving the circulatory and respiratory systems: Secondary | ICD-10-CM | POA: Insufficient documentation

## 2016-05-31 DIAGNOSIS — I2510 Atherosclerotic heart disease of native coronary artery without angina pectoris: Secondary | ICD-10-CM | POA: Insufficient documentation

## 2016-05-31 DIAGNOSIS — I341 Nonrheumatic mitral (valve) prolapse: Secondary | ICD-10-CM | POA: Diagnosis not present

## 2016-05-31 DIAGNOSIS — E119 Type 2 diabetes mellitus without complications: Secondary | ICD-10-CM | POA: Insufficient documentation

## 2016-05-31 LAB — ECHOCARDIOGRAM COMPLETE
AOASC: 27 cm
CHL CUP DOP CALC LVOT VTI: 17.6 cm
CHL CUP REG VEL DIAS: 152 cm/s
E decel time: 225 msec
E/e' ratio: 9.91
FS: 20 % — AB (ref 28–44)
IV/PV OW: 1.17
LA ID, A-P, ES: 42 mm
LA vol index: 38.8 mL/m2
LA vol: 74 mL
LADIAMINDEX: 2.2 cm/m2
LAVOLA4C: 60 mL
LDCA: 2.84 cm2
LEFT ATRIUM END SYS DIAM: 42 mm
LV E/e'average: 9.91
LV PW d: 10.8 mm — AB (ref 0.6–1.1)
LV TDI E'LATERAL: 11.3
LVEEMED: 9.91
LVELAT: 11.3 cm/s
LVOT peak vel: 69.2 cm/s
LVOTD: 19 mm
LVOTSV: 50 mL
MV Dec: 225
MVAP: 2.86 cm2
MVPG: 5 mmHg
MVPKAVEL: 66.6 m/s
MVPKEVEL: 112 m/s
MVSPHT: 77 ms
PV Reg grad dias: 9 mmHg
TDI e' medial: 7.02

## 2016-06-15 DIAGNOSIS — N183 Chronic kidney disease, stage 3 (moderate): Secondary | ICD-10-CM | POA: Diagnosis not present

## 2016-06-15 DIAGNOSIS — I10 Essential (primary) hypertension: Secondary | ICD-10-CM | POA: Diagnosis not present

## 2016-06-21 DIAGNOSIS — N390 Urinary tract infection, site not specified: Secondary | ICD-10-CM | POA: Diagnosis not present

## 2016-06-21 DIAGNOSIS — E559 Vitamin D deficiency, unspecified: Secondary | ICD-10-CM | POA: Diagnosis not present

## 2016-06-21 DIAGNOSIS — I10 Essential (primary) hypertension: Secondary | ICD-10-CM | POA: Diagnosis not present

## 2016-06-21 DIAGNOSIS — R809 Proteinuria, unspecified: Secondary | ICD-10-CM | POA: Diagnosis not present

## 2016-06-21 DIAGNOSIS — N183 Chronic kidney disease, stage 3 (moderate): Secondary | ICD-10-CM | POA: Diagnosis not present

## 2016-06-26 ENCOUNTER — Encounter: Payer: Self-pay | Admitting: *Deleted

## 2016-06-26 DIAGNOSIS — I152 Hypertension secondary to endocrine disorders: Secondary | ICD-10-CM

## 2016-06-26 NOTE — Congregational Nurse Program (Signed)
Congregational Nurse Program Note  Date of Encounter: 06/26/2016  Past Medical History: Past Medical History  Diagnosis Date  . ANEMIA-NOS 07/10/2007  . CORONARY ARTERY DISEASE 07/10/2007  . DIABETES MELLITUS, TYPE I 07/10/2007  . HYPERLIPIDEMIA 07/10/2007  . HYPERTENSION 07/10/2007  . RENAL INSUFFICIENCY 07/10/2007  . 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 Peripheral arterial disease [facility](HCC)     Encounter Details:     CNP Questionnaire - 06/26/16 1810    Patient Demographics   Is this a new or existing patient? Existing   Patient is considered a/an Not Applicable   Race Caucasian/White   Patient Assistance   Location of Patient Assistance Not Applicable   Patient's financial/insurance status Medicare   Uninsured Patient No   Patient referred to apply for the following financial assistance Not Applicable   Food insecurities addressed Not Applicable   Transportation assistance No   Assistance securing medications No   Educational health offerings Not Applicable   Encounter Details   Primary purpose of visit Other   Was an Emergency Department visit averted? Not Applicable   Does patient have a medical provider? Yes   Patient referred to Not Applicable   Was a mental health screening completed? (GAINS tool) No   Does patient have dental issues? No   Does patient have vision issues? No   Does your patient have an abnormal blood pressure today? No   Since previous encounter, have you referred patient for abnormal blood pressure that resulted in a new diagnosis or medication change? No   Does your patient have an abnormal blood glucose today? No   Since previous encounter, have you referred patient for abnormal blood glucose that resulted in a new diagnosis or medication change? No   Was there a life-saving intervention made? No         Amb Nursing Assessment - 06/26/16 1808    Pre-visit preparation   Pre-visit preparation completed Yes   Pain Assessment   Pain Assessment No/denies pain   Nutrition Screen   Nutritional Risks None   Diabetes Yes   CBG done? No   Did pt. bring in CBG monitor from home? No   Functional Status   Activities of Daily Living Independent   Ambulation Independent   Medication Administration Independent   Home Management Independent   Risk/Barriers  Assessment   Barriers to Care Management & Learning None   Abuse/Neglect Assessment   Do you feel unsafe in your current relationship? No   Do you feel physically threatened by others? No   Anyone hurting you at home, work, or school? No   Unable to ask? Yes   Information provided on Community resources No   Patient Literacy   How often do you need to have someone help you when you read instructions, pamphlets, or other written materials from your doctor or pharmacy? 1 - Never   What is the last grade level you completed in school? college   Language Assistant   Interpreter Needed? No   Comments   Comments for bp check   Information entered by : Anda Latinahonda Anika Shore,BSN,RN3,CCM,CN      [phone]/PATIENT IN FOR BLOOD PRESSURE CHECK/ brought home bp cuff in to be re calibrated/system errors obtained after using cuff/automatic system is approx.508-75 years old.  BP checked with manual cuff. BP 122/72-patient does state that this is what his bp was in the md office two weeks ago.  Plan is for patient to purchase new automatic cuff (is able to afford).

## 2016-06-28 ENCOUNTER — Ambulatory Visit (INDEPENDENT_AMBULATORY_CARE_PROVIDER_SITE_OTHER): Payer: Medicare Other | Admitting: Podiatry

## 2016-06-28 ENCOUNTER — Encounter: Payer: Self-pay | Admitting: Podiatry

## 2016-06-28 DIAGNOSIS — B351 Tinea unguium: Secondary | ICD-10-CM | POA: Diagnosis not present

## 2016-06-28 DIAGNOSIS — E1151 Type 2 diabetes mellitus with diabetic peripheral angiopathy without gangrene: Secondary | ICD-10-CM | POA: Diagnosis not present

## 2016-06-28 DIAGNOSIS — L84 Corns and callosities: Secondary | ICD-10-CM | POA: Diagnosis not present

## 2016-06-28 DIAGNOSIS — M79676 Pain in unspecified toe(s): Secondary | ICD-10-CM

## 2016-06-28 NOTE — Progress Notes (Signed)
Patient ID: Kerby LessGlenn T Zaldivar Jr., male   DOB: 1941-08-03, 75 y.o.   MRN: [id number]  Subjective: This patient presents today for scheduled visit complaining of toenails that are elongated and thickened cough walking wearing shoes request toenail debridement.  Objective: Orientated 3 No open skin lesions bilaterally Tailor bunion with plantar  callus , right foot The toenails are hypertrophic, incurvated, discolored, deformity tender direct palpation 6-10  Assessment: Symptomatic onychomycoses 6-10 Diabetic with a history of peripheral arterial disease Plantar callus sub-fifth right MPJ  Plan: Debridement toenails 6-10 mechanically and electrically without any bleeding Debrided plantar callus 1 without a bleeding  Patient is requesting diabetic shoes. I advised patient that his primary care physician will not give a certification for dispensing of diabetic shoes. I recommended that he contact his primary care physician and have him referred to an orthotist for diabetic shoes with custom insoles  Reappoint 3 months

## 2016-06-28 NOTE — Patient Instructions (Signed)
Diabetes and Foot Care Diabetes may cause you to have problems because of poor blood supply (circulation) to your feet and legs. This may cause the skin on your feet to become thinner, break easier, and heal more slowly. Your skin may become dry, and the skin may peel and crack. You may also have nerve damage in your legs and feet causing decreased feeling in them. You may not notice minor injuries to your feet that could lead to infections or more serious problems. Taking care of your feet is one of the most important things you can do for yourself.  HOME CARE INSTRUCTIONS  Wear shoes at all times, even in the house. Do not go barefoot. Bare feet are easily injured.  Check your feet daily for blisters, cuts, and redness. If you cannot see the bottom of your feet, use a mirror or ask someone for help.  Wash your feet with warm water (do not use hot water) and mild soap. Then pat your feet and the areas between your toes until they are completely dry. Do not soak your feet as this can dry your skin.  Apply a moisturizing lotion or petroleum jelly (that does not contain alcohol and is unscented) to the skin on your feet and to dry, brittle toenails. Do not apply lotion between your toes.  Trim your toenails straight across. Do not dig under them or around the cuticle. File the edges of your nails with an emery board or nail file.  Do not cut corns or calluses or try to remove them with medicine.  Wear clean socks or stockings every day. Make sure they are not too tight. Do not wear knee-high stockings since they may decrease blood flow to your legs.  Wear shoes that fit properly and have enough cushioning. To break in new shoes, wear them for just a few hours a day. This prevents you from injuring your feet. Always look in your shoes before you put them on to be sure there are no objects inside.  Do not cross your legs. This may decrease the blood flow to your feet.  If you find a minor scrape,  cut, or break in the skin on your feet, keep it and the skin around it clean and dry. These areas may be cleansed with mild soap and water. Do not cleanse the area with peroxide, alcohol, or iodine.  When you remove an adhesive bandage, be sure not to damage the skin around it.  If you have a wound, look at it several times a day to make sure it is healing.  Do not use heating pads or hot water bottles. They may burn your skin. If you have lost feeling in your feet or legs, you may not know it is happening until it is too late.  Make sure your health care provider performs a complete foot exam at least annually or more often if you have foot problems. Report any cuts, sores, or bruises to your health care provider immediately. SEEK MEDICAL CARE IF:   You have an injury that is not healing.  You have cuts or breaks in the skin.  You have an ingrown nail.  You notice redness on your legs or feet.  You feel burning or tingling in your legs or feet.  You have pain or cramps in your legs and feet.  Your legs or feet are numb.  Your feet always feel cold. SEEK IMMEDIATE MEDICAL CARE IF:   There is increasing redness,   swelling, or pain in or around a wound.  There is a red line that goes up your leg.  Pus is coming from a wound.  You develop a fever or as directed by your health care provider.  You notice a bad smell coming from an ulcer or wound.   This information is not intended to replace advice given to you by your health care provider. Make sure you discuss any questions you have with your health care provider.   Document Released: 12/08/2000 Document Revised: 08/13/2013 Document Reviewed: 05/20/2013 Elsevier Interactive Patient Education 2016 [employer]  

## 2016-07-07 ENCOUNTER — Ambulatory Visit (INDEPENDENT_AMBULATORY_CARE_PROVIDER_SITE_OTHER): Payer: Medicare Other | Admitting: Endocrinology

## 2016-07-07 ENCOUNTER — Encounter: Payer: Self-pay | Admitting: Endocrinology

## 2016-07-07 VITALS — BP 138/66 | HR 72 | Temp 99.0°F | Wt 164.0 lb

## 2016-07-07 DIAGNOSIS — I2510 Atherosclerotic heart disease of native coronary artery without angina pectoris: Secondary | ICD-10-CM | POA: Diagnosis not present

## 2016-07-07 DIAGNOSIS — N183 Chronic kidney disease, stage 3 (moderate): Secondary | ICD-10-CM

## 2016-07-07 DIAGNOSIS — IMO0002 Reserved for concepts with insufficient information to code with codable children: Secondary | ICD-10-CM

## 2016-07-07 DIAGNOSIS — E1065 Type 1 diabetes mellitus with hyperglycemia: Secondary | ICD-10-CM

## 2016-07-07 DIAGNOSIS — E1022 Type 1 diabetes mellitus with diabetic chronic kidney disease: Secondary | ICD-10-CM

## 2016-07-07 LAB — POCT GLYCOSYLATED HEMOGLOBIN (HGB A1C): HEMOGLOBIN A1C: 9.6

## 2016-07-07 MED ORDER — DOXYCYCLINE HYCLATE 100 MG PO TABS: 100.0000 mg | ORAL_TABLET | Freq: Two times a day (BID) | ORAL | Status: DC

## 2016-07-07 NOTE — Progress Notes (Signed)
Subjective:    Patient ID: Henry Less., male    DOB: 1941-05-30, 75 y.o.   MRN: [id number]  HPI  Pt returns for f/u of diabetes mellitus: DM type: 1 Dx'ed: 1961 Complications: renal insufficiency, CAD, PAD, and retinopathy. Therapy: insulin since dx.  DKA: never.   Severe hypoglycemia: last episode was approx 2008.   Pancreatitis: never.  Other: after poor results with multiple daily injections, in mid-2015, he was changed to 70/30; he has declined pump and continuous glucose monitor; pt says he cannot afford insulin analogs.   Interval history: He seldom has hypoglycemia, and these episodes are mild (60's).  He says in general, cbg is lowest at lunch, and highest in the afternoon.  He wants to continue same rx, as cbg's are improved. Pt states 5 days of slight scrape at the left 2nd toe.  No assoc pain Past Medical History  Diagnosis Date  . ANEMIA-NOS 07/10/2007  . CORONARY ARTERY DISEASE 07/10/2007  . DIABETES MELLITUS, TYPE I 07/10/2007  . HYPERLIPIDEMIA 07/10/2007  . HYPERTENSION 07/10/2007  . RENAL INSUFFICIENCY 07/10/2007  . 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 Peripheral arterial disease [facility])     Past Surgical History  Procedure Laterality Date  . 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   Marland Kitchen Appendectomy    . Carotid endarterectomy Right     right  . Cardiac catheterization      stenting in the vein graft to the right coronary artery and had_DES 3x 26 mm Integrity stent inserted, post dilated to 3.4 mm    Social History   Social History  . Marital Status: Married    Spouse Name: N/A  . Number of Children: N/A  . Years of Education: N/A   Occupational History  . Not on file.   Social History Main Topics  . Smoking status: Former Games developer  . Smokeless tobacco: Never Used  . Alcohol Use: No  . Drug Use: No  . Sexual Activity: Not on file   Other Topics Concern  . Not on file   Social History  Narrative   Says his diet and exercise are very good    Current Outpatient Prescriptions on File Prior to Visit  Medication Sig Dispense Refill  . acetaminophen (TYLENOL) 500 MG tablet Take 500 mg by mouth daily as needed (pain).    Marland Kitchen aspirin EC 81 MG tablet Take 81 mg by mouth daily.    . B Complex-C-Folic Acid (FOLBEE PLUS) TABS Take 1 tablet by mouth daily.      . Cholecalciferol (VITAMIN D) 2000 UNITS CAPS Take 2,000 Units by mouth daily.     Marland Kitchen gabapentin (NEURONTIN) 100 MG capsule Take 100 mg by mouth 2 (two) times daily.     . hydrochlorothiazide (MICROZIDE) 12.5 MG capsule Take 12.5 mg by mouth daily.    . insulin NPH-regular Human (NOVOLIN 70/30) (70-30) 100 UNIT/ML injection 38 units with breakfast and 25 units with supper 20 mL 11  . isosorbide mononitrate (IMDUR) 60 MG 24 hr tablet TAKE 1 TABLET BY MOUTH DAILY 30 tablet 3  . labetalol (NORMODYNE) 200 MG tablet Take 1 tablet (200 mg total) by mouth 2 (two) times daily. 60 tablet 11  . omeprazole (PRILOSEC OTC) 20 MG tablet Take 20 mg by mouth daily.     No current facility-administered medications on file prior to visit.    Allergies  Allergen Reactions  . Hydralazine Swelling  Feet swelled so bad they were cracking Severe foot swelling  . Penicillins Other (See Comments)    Per allergy test.  . Amlodipine Swelling  . Amlodipine Besylate Swelling  . Codeine Other (See Comments)    anxiety  . Lipitor [Atorvastatin Calcium] Other (See Comments)    myalgia  . Valsartan Other (See Comments) and Swelling    Caused kidney damage  . Penicillin G Rash    Family History  Problem Relation Age of Onset  . Cancer Neg Hx   . Heart disease Mother     BP 138/66 mmHg  Pulse 72  Temp(Src) 99 F (37.2 C) (Oral)  Wt 164 lb (74.39 kg)  Review of Systems Denies itching and fever    Objective:   Physical Exam VITAL SIGNS:  See vs page GENERAL: no distress Pulses: dorsalis pedis intact bilat.  Skin: feet are of normal  temp. There is patchy hyperpigmentation There is an old healed vein harvest scar at the left leg.  Left 2nd toe: 5 mm abrasion with 5 mm rim of erythema.  No open ulcer.  No swell/tend/drainage. Feet: no deformity. There is bilateral onychomycosis.  CV: no leg edema. Neuro: sensation is intact to touch on the feet, but decreased from normal.     A1c=9.5%    Assessment & Plan:  Toe abrasion, new Type 1 DM: glycemic control is improved.  However, rx is limited by variable cbg's.  Patient is advised the following: Patient Instructions  I have sent a prescription to your pharmacy, for an antibiotic pill.  Keep the area covered with antibiotic ointment and a bandaid, until it heals.  I hope you feel better soon.  If you don't feel better in 1-2 weeks, please call back.  Please call sooner if it gets worse.  Please come back for a follow-up appointment in 3 months.   Romero BellingELLISON, Akhil Piscopo, MD

## 2016-07-07 NOTE — Patient Instructions (Addendum)
I have sent a prescription to your pharmacy, for an antibiotic pill.  Keep the area covered with antibiotic ointment and a bandaid, until it heals.  I hope you feel better soon.  If you don't feel better in 1-2 weeks, please call back.  Please call sooner if it gets worse.  Please come back for a follow-up appointment in 3 months.

## 2016-07-14 ENCOUNTER — Ambulatory Visit: Payer: Medicare Other | Admitting: Endocrinology

## 2016-07-21 ENCOUNTER — Ambulatory Visit: Payer: Medicare Other | Admitting: Endocrinology

## 2016-07-26 DIAGNOSIS — I10 Essential (primary) hypertension: Secondary | ICD-10-CM | POA: Diagnosis not present

## 2016-07-26 DIAGNOSIS — N183 Chronic kidney disease, stage 3 (moderate): Secondary | ICD-10-CM | POA: Diagnosis not present

## 2016-08-02 DIAGNOSIS — R809 Proteinuria, unspecified: Secondary | ICD-10-CM | POA: Diagnosis not present

## 2016-08-02 DIAGNOSIS — I10 Essential (primary) hypertension: Secondary | ICD-10-CM | POA: Diagnosis not present

## 2016-08-02 DIAGNOSIS — N390 Urinary tract infection, site not specified: Secondary | ICD-10-CM | POA: Diagnosis not present

## 2016-08-02 DIAGNOSIS — E559 Vitamin D deficiency, unspecified: Secondary | ICD-10-CM | POA: Diagnosis not present

## 2016-08-02 DIAGNOSIS — N183 Chronic kidney disease, stage 3 (moderate): Secondary | ICD-10-CM | POA: Diagnosis not present

## 2016-08-30 ENCOUNTER — Encounter: Payer: Self-pay | Admitting: Podiatry

## 2016-08-30 ENCOUNTER — Ambulatory Visit (INDEPENDENT_AMBULATORY_CARE_PROVIDER_SITE_OTHER): Payer: Medicare Other | Admitting: Podiatry

## 2016-08-30 DIAGNOSIS — E1151 Type 2 diabetes mellitus with diabetic peripheral angiopathy without gangrene: Secondary | ICD-10-CM

## 2016-08-30 DIAGNOSIS — M79676 Pain in unspecified toe(s): Secondary | ICD-10-CM

## 2016-08-30 DIAGNOSIS — B351 Tinea unguium: Secondary | ICD-10-CM

## 2016-08-30 DIAGNOSIS — L84 Corns and callosities: Secondary | ICD-10-CM | POA: Diagnosis not present

## 2016-08-30 NOTE — Progress Notes (Signed)
Patient ID: Henry LessGlenn T Denunzio Jr., male   DOB: 1941-08-20, 75 y.o.   MRN: [id number]    Subjective: This patient presents today for scheduled visit complaining of toenails that are elongated and thickened and are uncomfortable when walking wearing shoes and he requests toenail debridement. Also, patient is complaining of callouses that uncomfortable on the plantar aspect right and left feet  Objective: Orientated 3 DP pulses 2/4 bilaterally PT pulses 0/4 bilaterally Capillary reflex within normal limits bilaterally Sensation to 10 g monofilament wire intact 4/5 bilaterally Vibratory sensation intact bilaterally Ankle reflex equal and reactive bilaterally Plantar callus fifth MPJ bilaterally Bunionette bilaterally No open skin lesions bilaterally The toenails are hypertrophic, incurvated, discolored, deformity tender direct palpation 6-10  Assessment: Symptomatic onychomycoses 6-10 Diabetic with a history of peripheral arterial disease Plantar callus sub-fifth MPJ, bilaterally  Plan: Debridement toenails 6-10 mechanically and electrically with slight bleeding distal third right toe, which was treated with topical antibiotic ointment and Band-Aid. Patient instructed removed Band-Aid 1-3 days and continue to apply topical antibiotic ointment and Band-Aid daily until healed. Debrided plantar calluses 2 without any bleeding  Patient is requesting diabetic shoes. I advised patient that his primary care physician will not give a certification for dispensing of diabetic shoes. I recommended that he contact his primary care physician and have him referred to an orthotist for diabetic shoes with custom insoles  Reappoint  61 days

## 2016-08-30 NOTE — Patient Instructions (Signed)
Daily after trimming your toenails there was a small amount of bleeding at the end of the third right toe. This area was treated with antibiotic ointment and Band-Aid. Remove the Band-Aid and 1-3 days and apply topical antibiotic ointment daily and a Band-Aid until a scab forms. Contact us if you have any concerns about this area.  Diabetes and Foot Care Diabetes may cause you to have problems because of poor blood supply (circulation) to your feet and legs. This may cause the skin on your feet to become thinner, break easier, and heal more slowly. Your skin may become dry, and the skin may peel and crack. You may also have nerve damage in your legs and feet causing decreased feeling in them. You may not notice minor injuries to your feet that could lead to infections or more serious problems. Taking care of your feet is one of the most important things you can do for yourself.  HOME CARE INSTRUCTIONS  Wear shoes at all times, even in the house. Do not go barefoot. Bare feet are easily injured.  Check your feet daily for blisters, cuts, and redness. If you cannot see the bottom of your feet, use a mirror or ask someone for help.  Wash your feet with warm water (do not use hot water) and mild soap. Then pat your feet and the areas between your toes until they are completely dry. Do not soak your feet as this can dry your skin.  Apply a moisturizing lotion or petroleum jelly (that does not contain alcohol and is unscented) to the skin on your feet and to dry, brittle toenails. Do not apply lotion between your toes.  Trim your toenails straight across. Do not dig under them or around the cuticle. File the edges of your nails with an emery board or nail file.  Do not cut corns or calluses or try to remove them with medicine.  Wear clean socks or stockings every day. Make sure they are not too tight. Do not wear knee-high stockings since they may decrease blood flow to your legs.  Wear shoes that  fit properly and have enough cushioning. To break in new shoes, wear them for just a few hours a day. This prevents you from injuring your feet. Always look in your shoes before you put them on to be sure there are no objects inside.  Do not cross your legs. This may decrease the blood flow to your feet.  If you find a minor scrape, cut, or break in the skin on your feet, keep it and the skin around it clean and dry. These areas may be cleansed with mild soap and water. Do not cleanse the area with peroxide, alcohol, or iodine.  When you remove an adhesive bandage, be sure not to damage the skin around it.  If you have a wound, look at it several times a day to make sure it is healing.  Do not use heating pads or hot water bottles. They may burn your skin. If you have lost feeling in your feet or legs, you may not know it is happening until it is too late.  Make sure your health care provider performs a complete foot exam at least annually or more often if you have foot problems. Report any cuts, sores, or bruises to your health care provider immediately. SEEK MEDICAL CARE IF:   You have an injury that is not healing.  You have cuts or breaks in the skin.  You  have an ingrown nail.  You notice redness on your legs or feet.  You feel burning or tingling in your legs or feet.  You have pain or cramps in your legs and feet.  Your legs or feet are numb.  Your feet always feel cold. SEEK IMMEDIATE MEDICAL CARE IF:   There is increasing redness, swelling, or pain in or around a wound.  There is a red line that goes up your leg.  Pus is coming from a wound.  You develop a fever or as directed by your health care provider.  You notice a bad smell coming from an ulcer or wound.   This information is not intended to replace advice given to you by your health care provider. Make sure you discuss any questions you have with your health care provider.   Document Released: 12/08/2000  Document Revised: 08/13/2013 Document Reviewed: 05/20/2013 Elsevier Interactive Patient Education [employer]

## 2016-09-04 DIAGNOSIS — N183 Chronic kidney disease, stage 3 (moderate): Secondary | ICD-10-CM | POA: Diagnosis not present

## 2016-09-04 DIAGNOSIS — N390 Urinary tract infection, site not specified: Secondary | ICD-10-CM | POA: Diagnosis not present

## 2016-09-04 DIAGNOSIS — I10 Essential (primary) hypertension: Secondary | ICD-10-CM | POA: Diagnosis not present

## 2016-09-04 DIAGNOSIS — E559 Vitamin D deficiency, unspecified: Secondary | ICD-10-CM | POA: Diagnosis not present

## 2016-09-25 ENCOUNTER — Other Ambulatory Visit: Payer: Self-pay | Admitting: Cardiovascular Disease

## 2016-09-25 NOTE — Telephone Encounter (Signed)
REFILL 

## 2016-10-04 DIAGNOSIS — N183 Chronic kidney disease, stage 3 (moderate): Secondary | ICD-10-CM | POA: Diagnosis not present

## 2016-10-04 DIAGNOSIS — I10 Essential (primary) hypertension: Secondary | ICD-10-CM | POA: Diagnosis not present

## 2016-10-06 ENCOUNTER — Encounter: Payer: Self-pay | Admitting: Endocrinology

## 2016-10-06 ENCOUNTER — Ambulatory Visit (INDEPENDENT_AMBULATORY_CARE_PROVIDER_SITE_OTHER): Payer: Medicare Other | Admitting: Endocrinology

## 2016-10-06 VITALS — BP 136/84 | HR 71 | Ht 70.5 in | Wt 168.0 lb

## 2016-10-06 DIAGNOSIS — N189 Chronic kidney disease, unspecified: Secondary | ICD-10-CM | POA: Diagnosis not present

## 2016-10-06 DIAGNOSIS — I2510 Atherosclerotic heart disease of native coronary artery without angina pectoris: Secondary | ICD-10-CM | POA: Diagnosis not present

## 2016-10-06 DIAGNOSIS — Z125 Encounter for screening for malignant neoplasm of prostate: Secondary | ICD-10-CM

## 2016-10-06 DIAGNOSIS — E1022 Type 1 diabetes mellitus with diabetic chronic kidney disease: Secondary | ICD-10-CM | POA: Diagnosis not present

## 2016-10-06 LAB — PSA, MEDICARE: PSA: 1.2 ng/mL (ref 0.10–4.00)

## 2016-10-06 LAB — POCT GLYCOSYLATED HEMOGLOBIN (HGB A1C): Hemoglobin A1C: 9.8

## 2016-10-06 MED ORDER — INSULIN NPH ISOPHANE & REGULAR (70-30) 100 UNIT/ML ~~LOC~~ SUSP: SUBCUTANEOUS | 11 refills | Status: DC

## 2016-10-06 NOTE — Patient Instructions (Addendum)
check your blood sugar 4 times a day: before the 3 meals, and at bedtime.  also check if you have symptoms of your blood sugar being too high or too low.  please keep a record of the readings and bring it to your next appointment here.  You can write it on any piece of paper.  please call us sooner if your blood sugar goes below 70, or if you have a lot of readings over 200.  Also, please write on your record why you think it is higher or lower: this is the next step in getting your blood sugar better. Please change the insulin to: 42 units with breakfast (25 if you are going to be active), and 25 units with the evening meal.   Please consider these measures for your health:  minimize alcohol.  Do not use tobacco products.  Have a colonoscopy at least every 10 years from age 75.  Women should have an annual mammogram from age 140.  Keep firearms safely stored.  Always use seat belts.  have working smoke alarms in your home.  See an eye doctor and dentist regularly.  Never drive under the influence of alcohol or drugs (including prescription drugs).  Those with fair skin should take precautions against the sun, and should carefully examine their skin once per month, for any new or changed moles. It is critically important to prevent falling down (keep floor areas well-lit, dry, and free of loose objects.  If you have a cane, walker, or wheelchair, you should use it, even for short trips around the house.  Wear flat-soled shoes.  Also, try not to rush).  Please come back for a follow-up appointment in 3 months.

## 2016-10-06 NOTE — Progress Notes (Signed)
we discussed code status.  pt requests full code, but would not want to be started or maintained on artificial life-support measures if there was not a reasonable chance of recovery 

## 2016-10-06 NOTE — Progress Notes (Signed)
Subjective:    Patient ID: Henry Less., male    DOB: 04-06-1941, 75 y.o.   MRN: [id number]  HPI Pt returns for f/u of diabetes mellitus: DM type: 1 Dx'ed: 1961 Complications: renal insufficiency, CAD, PAD, and retinopathy. Therapy: insulin since dx.  DKA: never.   Severe hypoglycemia: last episode was approx 2008.   Pancreatitis: never.  Other: after poor results with multiple daily injections, in mid-2015, he was changed to 70/30; he has declined pump and continuous glucose monitor; pt says he cannot afford insulin analogs.   Interval history: pt states he feels well in general.  He brings a record of his cbg's which I have reviewed today.  It varies from 90-286.  There is no trend throughout the day. He has been taking 30 units qam if activity.  He says cbg is lowest with activity.   Past Medical History:  Diagnosis Date  . ANEMIA-NOS 07/10/2007  . CORONARY ARTERY DISEASE 07/10/2007  . DIABETES MELLITUS, TYPE I 07/10/2007  . HYPERLIPIDEMIA 07/10/2007  . HYPERTENSION 07/10/2007  . Peripheral arterial disease (HCC)   . 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 RENAL INSUFFICIENCY 07/10/2007    Past Surgical History:  Procedure Laterality Date  . APPENDECTOMY    . CARDIAC CATHETERIZATION     stenting in the vein graft to the right coronary artery and had_DES 3x 26 mm Integrity stent inserted, post dilated to 3.4 mm  . CAROTID ENDARTERECTOMY Right    right  . CORONARY ARTERY BYPASS GRAFT  02/2010   PTCA of his right coroary artery.     Social History   Social History  . Marital status: Married    Spouse name: N/A  . Number of children: N/A  . Years of education: N/A   Occupational History  . Not on file.   Social History Main Topics  . Smoking status: Former Games developer  . Smokeless tobacco: Never Used  . Alcohol use No  . Drug use: No  . Sexual activity: Not on file   Other Topics Concern  . Not on file   Social History Narrative   Says his diet  and exercise are very good    Current Outpatient Prescriptions on File Prior to Visit  Medication Sig Dispense Refill  . acetaminophen (TYLENOL) 500 MG tablet Take 500 mg by mouth daily as needed (pain).    Marland Kitchen aspirin EC 81 MG tablet Take 81 mg by mouth daily.    . B Complex-C-Folic Acid (FOLBEE PLUS) TABS Take 1 tablet by mouth daily.      . Cholecalciferol (VITAMIN D) 2000 UNITS CAPS Take 2,000 Units by mouth daily.     Marland Kitchen doxycycline (VIBRA-TABS) 100 MG tablet Take 1 tablet (100 mg total) by mouth 2 (two) times daily. 20 tablet 0  . gabapentin (NEURONTIN) 100 MG capsule Take 100 mg by mouth 2 (two) times daily.     . hydrochlorothiazide (MICROZIDE) 12.5 MG capsule Take 12.5 mg by mouth daily.    . isosorbide mononitrate (IMDUR) 60 MG 24 hr tablet Take 1 tablet (60 mg total) by mouth daily. NEED OV. 30 tablet 3  . labetalol (NORMODYNE) 200 MG tablet Take 1 tablet (200 mg total) by mouth 2 (two) times daily. 60 tablet 11  . omeprazole (PRILOSEC OTC) 20 MG tablet Take 20 mg by mouth daily.     No current facility-administered medications on file prior to visit.     Allergies  Allergen Reactions  . Hydralazine Swelling  Feet swelled so bad they were cracking Severe foot swelling  . Penicillins Other (See Comments)    Per allergy test.  . Amlodipine Swelling  . Amlodipine Besylate Swelling  . Codeine Other (See Comments)    anxiety  . Lipitor [Atorvastatin Calcium] Other (See Comments)    myalgia  . Valsartan Other (See Comments) and Swelling    Caused kidney damage  . Penicillin G Rash    Family History  Problem Relation Age of Onset  . Cancer Neg Hx   . Heart disease Mother     BP 136/84   Pulse 71   Ht 5' 10.5" (1.791 m)   Wt 168 lb (76.2 kg)   SpO2 99%   BMI 23.76 kg/m    Review of Systems He denies hypoglycemia.     Objective:   Physical Exam VITAL SIGNS:  See vs page GENERAL: no distress Pulses: dorsalis pedis intact bilat.  Skin: feet are of normal  temp. no ulcer. There is patchy hyperpigmentation. There is an old healed vein harvest scar at the left leg.   Feet: no deformity. There is bilateral onychomycosis.  CV: 1+ bilat leg edema. Neuro: sensation is intact to touch on the feet, but decreased from normal.    A1c=9.8%    Assessment & Plan:  Type 1 DM: he needs increased rx.  I advised pump.  He again declines.   Please change the insulin to: 42 units with breakfast (25 if you are going to be active), and 25 units with the evening meal.    Subjective:   Patient here for Medicare annual wellness visit and management of other chronic and acute problems.     Risk factors: advanced age    Roster of Physicians Providing Medical Care to Patient:  See "snapshot"   Activities of Daily Living: In your present state of health, do you have any difficulty performing the following activities (lives with wife)?:  Preparing food and eating?: No  Bathing yourself: No  Getting dressed: No  Using the toilet:No  Moving around from place to place: No  In the past year have you fallen or had a near fall?:No    Home Safety: Has smoke detector and wears seat belts. No firearms. No excess sun exposure.    Diet and Exercise  Current exercise habits: pt says good Dietary issues discussed: pt reports a healthy diet   Depression Screen  Q1: Over the past two weeks, have you felt down, depressed or hopeless? no  Q2: Over the past two weeks, have you felt little interest or pleasure in doing things? no   The following portions of the patient's history were reviewed and updated as appropriate: allergies, current medications, past family history, past medical history, past social history, past surgical history and problem list.   Review of Systems  Denies hearing loss, and visual loss Objective:   Vision:  [place] opthalmologist, so he declines [facility] today Hearing: grossly normal Body mass index:  See vs page Msk: pt easily and quickly performs  "get-up-and-go" from a sitting position Cognitive Impairment Assessment: cognition, memory and judgment appear normal.  remembers 3/3 at 5 minutes.  excellent recall.  can easily read and write a sentence.  alert and oriented x 3   Assessment:   Medicare wellness utd on preventive parameters    Plan:   During the course of the visit the patient was educated and counseled about appropriate screening and preventive services including:  Fall prevention   Diabetes screening  Nutrition counseling   Vaccines / LABS Vaccines are declined  today  PSA  Patient Instructions (the written plan) was given to the patient.

## 2016-10-09 DIAGNOSIS — E559 Vitamin D deficiency, unspecified: Secondary | ICD-10-CM | POA: Diagnosis not present

## 2016-10-09 DIAGNOSIS — N183 Chronic kidney disease, stage 3 (moderate): Secondary | ICD-10-CM | POA: Diagnosis not present

## 2016-10-09 DIAGNOSIS — I10 Essential (primary) hypertension: Secondary | ICD-10-CM | POA: Diagnosis not present

## 2016-10-09 DIAGNOSIS — N390 Urinary tract infection, site not specified: Secondary | ICD-10-CM | POA: Diagnosis not present

## 2016-11-01 ENCOUNTER — Ambulatory Visit (INDEPENDENT_AMBULATORY_CARE_PROVIDER_SITE_OTHER): Payer: Medicare Other | Admitting: Podiatry

## 2016-11-01 ENCOUNTER — Encounter: Payer: Self-pay | Admitting: Podiatry

## 2016-11-01 VITALS — BP 160/53 | HR 65 | Resp 18

## 2016-11-01 DIAGNOSIS — B351 Tinea unguium: Secondary | ICD-10-CM

## 2016-11-01 DIAGNOSIS — E1151 Type 2 diabetes mellitus with diabetic peripheral angiopathy without gangrene: Secondary | ICD-10-CM

## 2016-11-01 DIAGNOSIS — Q828 Other specified congenital malformations of skin: Secondary | ICD-10-CM

## 2016-11-01 DIAGNOSIS — M79676 Pain in unspecified toe(s): Secondary | ICD-10-CM

## 2016-11-01 DIAGNOSIS — L84 Corns and callosities: Secondary | ICD-10-CM

## 2016-11-01 NOTE — Patient Instructions (Signed)
Diabetes and Foot Care Diabetes may cause you to have problems because of poor blood supply (circulation) to your feet and legs. This may cause the skin on your feet to become thinner, break easier, and heal more slowly. Your skin may become dry, and the skin may peel and crack. You may also have nerve damage in your legs and feet causing decreased feeling in them. You may not notice minor injuries to your feet that could lead to infections or more serious problems. Taking care of your feet is one of the most important things you can do for yourself.  HOME CARE INSTRUCTIONS  Wear shoes at all times, even in the house. Do not go barefoot. Bare feet are easily injured.  Check your feet daily for blisters, cuts, and redness. If you cannot see the bottom of your feet, use a mirror or ask someone for help.  Wash your feet with warm water (do not use hot water) and mild soap. Then pat your feet and the areas between your toes until they are completely dry. Do not soak your feet as this can dry your skin.  Apply a moisturizing lotion or petroleum jelly (that does not contain alcohol and is unscented) to the skin on your feet and to dry, brittle toenails. Do not apply lotion between your toes.  Trim your toenails straight across. Do not dig under them or around the cuticle. File the edges of your nails with an emery board or nail file.  Do not cut corns or calluses or try to remove them with medicine.  Wear clean socks or stockings every day. Make sure they are not too tight. Do not wear knee-high stockings since they may decrease blood flow to your legs.  Wear shoes that fit properly and have enough cushioning. To break in new shoes, wear them for just a few hours a day. This prevents you from injuring your feet. Always look in your shoes before you put them on to be sure there are no objects inside.  Do not cross your legs. This may decrease the blood flow to your feet.  If you find a minor scrape,  cut, or break in the skin on your feet, keep it and the skin around it clean and dry. These areas may be cleansed with mild soap and water. Do not cleanse the area with peroxide, alcohol, or iodine.  When you remove an adhesive bandage, be sure not to damage the skin around it.  If you have a wound, look at it several times a day to make sure it is healing.  Do not use heating pads or hot water bottles. They may burn your skin. If you have lost feeling in your feet or legs, you may not know it is happening until it is too late.  Make sure your health care provider performs a complete foot exam at least annually or more often if you have foot problems. Report any cuts, sores, or bruises to your health care provider immediately. SEEK MEDICAL CARE IF:   You have an injury that is not healing.  You have cuts or breaks in the skin.  You have an ingrown nail.  You notice redness on your legs or feet.  You feel burning or tingling in your legs or feet.  You have pain or cramps in your legs and feet.  Your legs or feet are numb.  Your feet always feel cold. SEEK IMMEDIATE MEDICAL CARE IF:   There is increasing redness,   swelling, or pain in or around a wound.  There is a red line that goes up your leg.  Pus is coming from a wound.  You develop a fever or as directed by your health care provider.  You notice a bad smell coming from an ulcer or wound.   This information is not intended to replace advice given to you by your health care provider. Make sure you discuss any questions you have with your health care provider.   Document Released: 12/08/2000 Document Revised: 08/13/2013 Document Reviewed: 05/20/2013 Elsevier Interactive Patient Education 2016 [employer]  

## 2016-11-01 NOTE — Progress Notes (Signed)
Patient ID: Henry LessGlenn T Ganaway Jr., male   DOB: May 16, 1941, 75 y.o.   MRN: [id number]   Subjective: This patient presents today for scheduled visit complaining of toenails that are elongated and thickened and are uncomfortable when walking wearing shoes and he requests toenail debridement. Also, patient is complaining of callouses that uncomfortable on the plantar aspect right and left feet  Objective: Orientated 3 DP pulses 2/4 bilaterally PT pulses 0/4 bilaterally Capillary reflex within normal limits bilaterally Sensation to 10 g monofilament wire intact 4/5 bilaterally Vibratory sensation intact bilaterally Ankle reflex equal and reactive bilaterally Plantar callus fifth MPJ bilaterally Bunionette bilaterally No open skin lesions bilaterally The toenails are hypertrophic, incurvated, discolored, deformity tender direct palpation 6-10  Assessment: Symptomatic onychomycoses 6-10 Diabetic with a history of peripheral arterial disease Plantar callus sub-fifth MPJ, bilaterally Plantar callus heels bilaterally  Plan: Debridement toenails 6-10 mechanically and electrically without any bleeding Debrided plantar calluses 4 without any bleeding  Patient is requesting diabetic shoes. I advised patient that his primary care physician will not give a certification for dispensing of diabetic shoes. I recommended that he contact his primary care physician and have him referred to an orthotist for diabetic shoes with custom insoles  Reappoint  75 days

## 2016-11-02 DIAGNOSIS — E103513 Type 1 diabetes mellitus with proliferative diabetic retinopathy with macular edema, bilateral: Secondary | ICD-10-CM | POA: Diagnosis not present

## 2016-11-02 DIAGNOSIS — E11311 Type 2 diabetes mellitus with unspecified diabetic retinopathy with macular edema: Secondary | ICD-10-CM | POA: Diagnosis not present

## 2016-11-02 DIAGNOSIS — Z961 Presence of intraocular lens: Secondary | ICD-10-CM | POA: Diagnosis not present

## 2016-11-03 DIAGNOSIS — I10 Essential (primary) hypertension: Secondary | ICD-10-CM | POA: Diagnosis not present

## 2016-11-03 DIAGNOSIS — N183 Chronic kidney disease, stage 3 (moderate): Secondary | ICD-10-CM | POA: Diagnosis not present

## 2016-11-07 DIAGNOSIS — Z1212 Encounter for screening for malignant neoplasm of rectum: Secondary | ICD-10-CM | POA: Diagnosis not present

## 2016-11-07 DIAGNOSIS — Z1211 Encounter for screening for malignant neoplasm of colon: Secondary | ICD-10-CM | POA: Diagnosis not present

## 2016-11-09 DIAGNOSIS — I10 Essential (primary) hypertension: Secondary | ICD-10-CM | POA: Diagnosis not present

## 2016-11-09 DIAGNOSIS — N183 Chronic kidney disease, stage 3 (moderate): Secondary | ICD-10-CM | POA: Diagnosis not present

## 2016-11-09 DIAGNOSIS — N390 Urinary tract infection, site not specified: Secondary | ICD-10-CM | POA: Diagnosis not present

## 2016-11-09 DIAGNOSIS — E559 Vitamin D deficiency, unspecified: Secondary | ICD-10-CM | POA: Diagnosis not present

## 2016-11-29 DIAGNOSIS — H40033 Anatomical narrow angle, bilateral: Secondary | ICD-10-CM | POA: Diagnosis not present

## 2016-11-29 DIAGNOSIS — E119 Type 2 diabetes mellitus without complications: Secondary | ICD-10-CM | POA: Diagnosis not present

## 2016-12-20 ENCOUNTER — Telehealth: Payer: Self-pay

## 2016-12-20 NOTE — Telephone Encounter (Signed)
I contacted the patient and advised cologuard report was normal per Dr. Everardo AllEllison. Report sent to scan.

## 2016-12-31 NOTE — Progress Notes (Signed)
Subjective:    Patient ID: Henry LessGlenn T Cura Jr., male    DOB: [birth year], 76 y.o.   MRN: [id number]  HPI Pt returns for f/u of diabetes mellitus: DM type: 1 Dx'ed: 1961 Complications: renal insufficiency, CAD, PAD, and retinopathy. Therapy: insulin since dx.  DKA: never.   Severe hypoglycemia: last episode was approx 2008.   Pancreatitis: never.  Other: after poor results with multiple daily injections, in mid-2015, he was changed to 70/30; he has declined pump and continuous glucose monitor; pt says he cannot afford insulin analogs.   Interval history: pt states he feels well in general.  He brings a record of his cbg's which I have reviewed today.  It varies from 83-325.  There is no trend throughout the day.  He says cbg is still lowest with activity.   Past Medical History:  Diagnosis Date  . ANEMIA-NOS 07/10/2007  . CORONARY ARTERY DISEASE 07/10/2007  . DIABETES MELLITUS, TYPE I 07/10/2007  . HYPERLIPIDEMIA 07/10/2007  . HYPERTENSION 07/10/2007  . Peripheral arterial disease (HCC)   . 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 RENAL INSUFFICIENCY 07/10/2007    Past Surgical History:  Procedure Laterality Date  . APPENDECTOMY    . CARDIAC CATHETERIZATION     stenting in the vein graft to the right coronary artery and had_DES 3x 26 mm Integrity stent inserted, post dilated to 3.4 mm  . CAROTID ENDARTERECTOMY Right    right  . CORONARY ARTERY BYPASS GRAFT  02/2010   PTCA of his right coroary artery.     Social History   Social History  . Marital status: Married    Spouse name: N/A  . Number of children: N/A  . Years of education: N/A   Occupational History  . Not on file.   Social History Main Topics  . Smoking status: Former Games developermoker  . Smokeless tobacco: Never Used  . Alcohol use No  . Drug use: No  . Sexual activity: Not on file   Other Topics Concern  . Not on file   Social History Narrative   Says his diet and exercise are very good    Current  Outpatient Prescriptions on File Prior to Visit  Medication Sig Dispense Refill  . acetaminophen (TYLENOL) 500 MG tablet Take 500 mg by mouth daily as needed (pain).    Marland Kitchen. aspirin EC 81 MG tablet Take 81 mg by mouth daily.    . B Complex-C-Folic Acid (FOLBEE PLUS) TABS Take 1 tablet by mouth daily.      . Cholecalciferol (VITAMIN D) 2000 UNITS CAPS Take 2,000 Units by mouth daily.     Marland Kitchen. gabapentin (NEURONTIN) 100 MG capsule Take 100 mg by mouth 2 (two) times daily.     . hydrochlorothiazide (MICROZIDE) 12.5 MG capsule Take 12.5 mg by mouth daily.    . insulin NPH-regular Human (NOVOLIN 70/30) (70-30) 100 UNIT/ML injection 42 units with breakfast and 25 units with supper 20 mL 11  . isosorbide mononitrate (IMDUR) 60 MG 24 hr tablet Take 1 tablet (60 mg total) by mouth daily. NEED OV. 30 tablet 3  . labetalol (NORMODYNE) 200 MG tablet Take 1 tablet (200 mg total) by mouth 2 (two) times daily. (Patient taking differently: Take 200 mg by mouth 3 (three) times daily. ) 60 tablet 11  . omeprazole (PRILOSEC OTC) 20 MG tablet Take 20 mg by mouth daily.     No current facility-administered medications on file prior to visit.     Allergies  Allergen  Reactions  . Hydralazine Swelling    Feet swelled so bad they were cracking Severe foot swelling  . Penicillins Other (See Comments)    Per allergy test.  . Amlodipine Swelling  . Amlodipine Besylate Swelling  . Codeine Other (See Comments)    anxiety  . Lipitor [Atorvastatin Calcium] Other (See Comments)    myalgia  . Valsartan Other (See Comments) and Swelling    Caused kidney damage  . Penicillin G Rash   Family History  Problem Relation Age of Onset  . Cancer Neg Hx   . Heart disease Mother    BP 128/78   Pulse 67   Ht 5\' 10"  (1.778 m)   Wt 167 lb (75.8 kg)   SpO2 97%   BMI 23.96 kg/m   Review of Systems Denies LOC.      Objective:   Physical Exam VITAL SIGNS:  See vs page.  GENERAL: no distress.  Pulses: dorsalis pedis  intact bilat.  Skin: feet are of normal temp. no ulcer. There is patchy hyperpigmentation. There is an old healed vein harvest scar at the left leg.   Feet: no deformity. There is bilateral onychomycosis.  CV: trace bilat leg edema.  Neuro: sensation is intact to touch on the feet, but decreased from normal.    A1c=9.3%     Assessment & Plan:  Type 1 DM renal insufficiency: therapy limited by variable cbg's.    Patient is advised the following: Patient Instructions  check your blood sugar 4 times a day: before the 3 meals, and at bedtime.  also check if you have symptoms of your blood sugar being too high or too low.  please keep a record of the readings and bring it to your next appointment here.  You can write it on any piece of paper.  please call us sooner if your blood sugar goes below 70, or if you have a lot of readings over 200.  Also, please write on your record why you think it is higher or lower: this is the next step in getting your blood sugar better. Please continue the same insulin: 42 units with breakfast (25 if you are going to be active), and 25 units with the evening meal.   Please see linda, to consider a V-GO device.  Please come back for a follow-up appointment in 2 months.

## 2017-01-03 ENCOUNTER — Ambulatory Visit (INDEPENDENT_AMBULATORY_CARE_PROVIDER_SITE_OTHER): Payer: Medicare Other | Admitting: Podiatry

## 2017-01-03 ENCOUNTER — Encounter: Payer: Self-pay | Admitting: Podiatry

## 2017-01-03 VITALS — BP 145/52 | HR 65 | Resp 18

## 2017-01-03 DIAGNOSIS — B351 Tinea unguium: Secondary | ICD-10-CM | POA: Diagnosis not present

## 2017-01-03 DIAGNOSIS — E1151 Type 2 diabetes mellitus with diabetic peripheral angiopathy without gangrene: Secondary | ICD-10-CM

## 2017-01-03 DIAGNOSIS — M79676 Pain in unspecified toe(s): Secondary | ICD-10-CM | POA: Diagnosis not present

## 2017-01-03 NOTE — Patient Instructions (Signed)

## 2017-01-03 NOTE — Progress Notes (Signed)
Patient ID: Henry LessGlenn T Solem Jr., male   DOB: Dec 15, 1941, 76 y.o.   MRN: [id number]    Subjective: This patient presents today for scheduled visit complaining of toenails that are elongated and thickened and are uncomfortable whenwalking wearing shoesand herequeststoenail debridement.Also, patient is complaining of callouses that uncomfortable on the plantar aspect right and left feet  Objective: Orientated 3 DP pulses 2/4 bilaterally PT pulses 0/4 bilaterally Capillary reflex within normal limits bilaterally Sensation to 10 g monofilament wire intact 4/5 bilaterally Vibratory sensation intact bilaterally Ankle reflex equal and reactive bilaterally Plantar callus fifth MPJ bilaterally Bunionette bilaterally No open skin lesions bilaterally The toenails are hypertrophic, incurvated, discolored, deformity tender direct palpation 6-10  Assessment: Symptomatic onychomycoses 6-10 Diabetic with a history of peripheral arterial disease Plantar callus sub-fifth MPJ, bilaterally Plantar callus heels bilaterally  Plan: Debridement toenails 6-10 mechanically and electrically without any bleeding Debrided plantar calluses 2without anybleeding  Patient is requesting diabetic shoes. I advised patient that his primary care physician will not give a certification for dispensing of diabetic shoes. I recommended that he contact his primary care physician and have him referred to an orthotist for diabetic shoes with custom insoles  Reappoint 61 days

## 2017-01-05 ENCOUNTER — Ambulatory Visit (INDEPENDENT_AMBULATORY_CARE_PROVIDER_SITE_OTHER): Payer: Medicare Other | Admitting: Endocrinology

## 2017-01-05 ENCOUNTER — Encounter: Payer: Self-pay | Admitting: Endocrinology

## 2017-01-05 VITALS — BP 128/78 | HR 67 | Ht 70.0 in | Wt 167.0 lb

## 2017-01-05 DIAGNOSIS — E1022 Type 1 diabetes mellitus with diabetic chronic kidney disease: Secondary | ICD-10-CM

## 2017-01-05 DIAGNOSIS — N183 Chronic kidney disease, stage 3 (moderate): Secondary | ICD-10-CM

## 2017-01-05 DIAGNOSIS — E1065 Type 1 diabetes mellitus with hyperglycemia: Secondary | ICD-10-CM | POA: Diagnosis not present

## 2017-01-05 DIAGNOSIS — IMO0002 Reserved for concepts with insufficient information to code with codable children: Secondary | ICD-10-CM

## 2017-01-05 LAB — POCT GLYCOSYLATED HEMOGLOBIN (HGB A1C): Hemoglobin A1C: 9.3

## 2017-01-05 NOTE — Patient Instructions (Addendum)
check your blood sugar 4 times a day: before the 3 meals, and at bedtime.  also check if you have symptoms of your blood sugar being too high or too low.  please keep a record of the readings and bring it to your next appointment here.  You can write it on any piece of paper.  please call us sooner if your blood sugar goes below 70, or if you have a lot of readings over 200.  Also, please write on your record why you think it is higher or lower: this is the next step in getting your blood sugar better. Please continue the same insulin: 42 units with breakfast (25 if you are going to be active), and 25 units with the evening meal.   Please see linda, to consider a V-GO device.  Please come back for a follow-up appointment in 2 months.

## 2017-01-17 ENCOUNTER — Encounter: Payer: Medicare Other | Attending: Endocrinology | Admitting: Nutrition

## 2017-01-17 DIAGNOSIS — N183 Chronic kidney disease, stage 3 (moderate): Secondary | ICD-10-CM | POA: Insufficient documentation

## 2017-01-17 DIAGNOSIS — E1065 Type 1 diabetes mellitus with hyperglycemia: Secondary | ICD-10-CM | POA: Diagnosis not present

## 2017-01-17 DIAGNOSIS — Z713 Dietary counseling and surveillance: Secondary | ICD-10-CM | POA: Insufficient documentation

## 2017-01-17 DIAGNOSIS — E1142 Type 2 diabetes mellitus with diabetic polyneuropathy: Secondary | ICD-10-CM

## 2017-01-17 DIAGNOSIS — E1022 Type 1 diabetes mellitus with diabetic chronic kidney disease: Secondary | ICD-10-CM | POA: Diagnosis not present

## 2017-01-18 NOTE — Progress Notes (Signed)
Pt reports that he is a very brittle diabetic, and that his blood sugars drop low for no reason.  He is on Novolin 70/30 insulin that he takes twice a day.  We discussed how the V-Go works and the advatagaes of this insulin delivery device over what he is doing now.  It was explained, why his blood sugars vary daily, and during the day--because he is on a long acting insulin whose timing is unpredictable, based on where injected, and dept of injection. He is willing to try this device, but wants to know the cost.  He filled out an insurance verification form and it was faxed to [employer].   He was given a brochure on the V-Go that he can read, and learn about, and will call me when he hears back from them.  He was given my telephone number

## 2017-01-18 NOTE — Patient Instructions (Signed)
Call for an appt. If you decide to try the V-Go REad over information about this, and call the 800 telephone number if questions.

## 2017-01-23 ENCOUNTER — Other Ambulatory Visit: Payer: Self-pay | Admitting: Cardiovascular Disease

## 2017-01-23 NOTE — Telephone Encounter (Signed)
Rx(s) sent to pharmacy electronically.  

## 2017-02-01 DIAGNOSIS — I10 Essential (primary) hypertension: Secondary | ICD-10-CM | POA: Diagnosis not present

## 2017-02-01 DIAGNOSIS — N183 Chronic kidney disease, stage 3 (moderate): Secondary | ICD-10-CM | POA: Diagnosis not present

## 2017-02-06 ENCOUNTER — Other Ambulatory Visit: Payer: Self-pay | Admitting: Cardiovascular Disease

## 2017-02-08 DIAGNOSIS — N183 Chronic kidney disease, stage 3 (moderate): Secondary | ICD-10-CM | POA: Diagnosis not present

## 2017-02-08 DIAGNOSIS — N390 Urinary tract infection, site not specified: Secondary | ICD-10-CM | POA: Diagnosis not present

## 2017-02-08 DIAGNOSIS — R809 Proteinuria, unspecified: Secondary | ICD-10-CM | POA: Diagnosis not present

## 2017-02-08 DIAGNOSIS — E559 Vitamin D deficiency, unspecified: Secondary | ICD-10-CM | POA: Diagnosis not present

## 2017-02-08 DIAGNOSIS — I10 Essential (primary) hypertension: Secondary | ICD-10-CM | POA: Diagnosis not present

## 2017-02-15 ENCOUNTER — Telehealth: Payer: Self-pay | Admitting: Cardiovascular Disease

## 2017-02-15 MED ORDER — ISOSORBIDE MONONITRATE ER 60 MG PO TB24: 60.0000 mg | ORAL_TABLET | Freq: Every day | ORAL | 1 refills | Status: DC

## 2017-02-15 NOTE — Telephone Encounter (Signed)
New message  Pt has appt 3/19 however medications will not last until then    *STAT* If patient is at the pharmacy, call can be transferred to refill team.   1. Which medications need to be refilled? (please list name of each medication and dose if known) isosorbide mononitrate (IMDUR) 60 MG 24 hr tablet  2. Which pharmacy/location (including street and city if local pharmacy) is medication to be sent to? [facility] on [address] [place]  3. Do they need a 30 day or 90 day supply? 30 day

## 2017-02-15 NOTE — Telephone Encounter (Signed)
Rx(s) sent to pharmacy electronically. Patient has MD OV 03/12/17

## 2017-03-07 ENCOUNTER — Encounter: Payer: Self-pay | Admitting: Podiatry

## 2017-03-07 ENCOUNTER — Ambulatory Visit (INDEPENDENT_AMBULATORY_CARE_PROVIDER_SITE_OTHER): Payer: Medicare Other | Admitting: Endocrinology

## 2017-03-07 ENCOUNTER — Ambulatory Visit (INDEPENDENT_AMBULATORY_CARE_PROVIDER_SITE_OTHER): Payer: Medicare Other | Admitting: Podiatry

## 2017-03-07 ENCOUNTER — Encounter: Payer: Self-pay | Admitting: Endocrinology

## 2017-03-07 VITALS — BP 136/62 | HR 69 | Ht 70.0 in | Wt 168.0 lb

## 2017-03-07 VITALS — BP 171/67 | HR 70 | Resp 18

## 2017-03-07 DIAGNOSIS — L84 Corns and callosities: Secondary | ICD-10-CM | POA: Diagnosis not present

## 2017-03-07 DIAGNOSIS — E1065 Type 1 diabetes mellitus with hyperglycemia: Secondary | ICD-10-CM | POA: Diagnosis not present

## 2017-03-07 DIAGNOSIS — E1151 Type 2 diabetes mellitus with diabetic peripheral angiopathy without gangrene: Secondary | ICD-10-CM

## 2017-03-07 DIAGNOSIS — M79676 Pain in unspecified toe(s): Secondary | ICD-10-CM

## 2017-03-07 DIAGNOSIS — N183 Chronic kidney disease, stage 3 (moderate): Secondary | ICD-10-CM | POA: Diagnosis not present

## 2017-03-07 DIAGNOSIS — E1022 Type 1 diabetes mellitus with diabetic chronic kidney disease: Secondary | ICD-10-CM | POA: Diagnosis not present

## 2017-03-07 DIAGNOSIS — IMO0002 Reserved for concepts with insufficient information to code with codable children: Secondary | ICD-10-CM

## 2017-03-07 DIAGNOSIS — B351 Tinea unguium: Secondary | ICD-10-CM

## 2017-03-07 LAB — POCT GLYCOSYLATED HEMOGLOBIN (HGB A1C): Hemoglobin A1C: 9

## 2017-03-07 NOTE — Patient Instructions (Signed)

## 2017-03-07 NOTE — Progress Notes (Signed)
Subjective:    Patient ID: Henry LessGlenn T Sharpnack Jr., male    DOB: 11-23-1941, 76 y.o.   MRN: [id number]  HPI Pt returns for f/u of diabetes mellitus: DM type: 1 Dx'ed: 1961 Complications: renal insufficiency, CAD, PAD, and retinopathy.   Therapy: insulin since dx.  DKA: never.   Severe hypoglycemia: last episode was approx 2008.   Pancreatitis: never.  Other: after poor results with multiple daily injections, he was changed to 70/30; he has declined pump and continuous glucose monitor; pt says he cannot afford insulin analogs.  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  Interval history: pt states he feels well in general.  He brings a record of his cbg's which I have reviewed today.  It varies from 61-291.  There is no trend throughout the day.  He declines V-GO, due to cost.   Past Medical History:  Diagnosis Date  . ANEMIA-NOS 07/10/2007  . CORONARY ARTERY DISEASE 07/10/2007  . DIABETES MELLITUS, TYPE I 07/10/2007  . HYPERLIPIDEMIA 07/10/2007  . HYPERTENSION 07/10/2007  . Peripheral arterial disease (HCC)   . 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 RENAL INSUFFICIENCY 07/10/2007    Past Surgical History:  Procedure Laterality Date  . APPENDECTOMY    . CARDIAC CATHETERIZATION     stenting in the vein graft to the right coronary artery and had_DES 3x 26 mm Integrity stent inserted, post dilated to 3.4 mm  . CAROTID ENDARTERECTOMY Right    right  . CORONARY ARTERY BYPASS GRAFT  02/2010   PTCA of his right coroary artery.     Social History   Social History  . Marital status: Married    Spouse name: N/A  . Number of children: N/A  . Years of education: N/A   Occupational History  . Not on file.   Social History Main Topics  . Smoking status: Former Games developermoker  . Smokeless tobacco: Never Used  . Alcohol use No  . Drug use: No  . Sexual activity: Not on file   Other Topics Concern  . Not on file   Social History Narrative   Says  his diet and exercise are very good    Current Outpatient Prescriptions on File Prior to Visit  Medication Sig Dispense Refill  . acetaminophen (TYLENOL) 500 MG tablet Take 500 mg by mouth daily as needed (pain).    Marland Kitchen. aspirin EC 81 MG tablet Take 81 mg by mouth daily.    . B Complex-C-Folic Acid (FOLBEE PLUS) TABS Take 1 tablet by mouth daily.      . Cholecalciferol (VITAMIN D) 2000 UNITS CAPS Take 2,000 Units by mouth daily.     Marland Kitchen. gabapentin (NEURONTIN) 100 MG capsule Take 100 mg by mouth 2 (two) times daily.     . hydrochlorothiazide (MICROZIDE) 12.5 MG capsule Take 12.5 mg by mouth daily.    . insulin NPH-regular Human (NOVOLIN 70/30) (70-30) 100 UNIT/ML injection 42 units with breakfast and 25 units with supper 20 mL 11  . isosorbide mononitrate (IMDUR) 60 MG 24 hr tablet Take 1 tablet (60 mg total) by mouth daily. 30 tablet 1  . labetalol (NORMODYNE) 200 MG tablet Take 1 tablet (200 mg total) by mouth 2 (two) times daily. (Patient taking differently: Take 200 mg by mouth 3 (three) times daily. ) 60 tablet 11  . omeprazole (PRILOSEC OTC) 20 MG tablet Take 20 mg by mouth daily.     No current facility-administered medications on file prior to visit.  Allergies  Allergen Reactions  . Hydralazine Swelling    Feet swelled so bad they were cracking Severe foot swelling  . Penicillins Other (See Comments)    Per allergy test.  . Amlodipine Swelling  . Amlodipine Besylate Swelling  . Codeine Other (See Comments)    anxiety  . Lipitor [Atorvastatin Calcium] Other (See Comments)    myalgia  . Valsartan Other (See Comments) and Swelling    Caused kidney damage  . Penicillin G Rash    Family History  Problem Relation Age of Onset  . Cancer Neg Hx   . Heart disease Mother     BP 136/62   Pulse 69   Ht 5\' 10"  (1.778 m)   Wt 168 lb (76.2 kg)   SpO2 97%   BMI 24.11 kg/m   Review of Systems Denies LOC.      Objective:   Physical Exam VITAL SIGNS:  See vs page.    GENERAL: no distress.  Pulses: dorsalis pedis intact bilat.  Skin: feet are of normal temp. no ulcer. There is patchy hyperpigmentation. There is an old healed vein harvest scar at the left leg.   Feet: no deformity. There is bilateral onychomycosis.  CV: trace bilat leg edema.  Neuro: sensation is intact to touch on the feet, but decreased from normal.     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   Patient is advised the following: Patient Instructions  check your blood sugar 4 times a day: before the 3 meals, and at bedtime.  also check if you have symptoms of your blood sugar being too high or too low.  please keep a record of the readings and bring it to your next appointment here.  You can write it on any piece of paper.  please call us sooner if your blood sugar goes below 70, or if you have a lot of readings over 200.  Also, please write on your record why you think it is higher or lower: this is the next step in getting your blood sugar better. Please continue the same insulin: 42 units with breakfast (25 if you are going to be active), and 25 units with the evening meal.   Please come back for a follow-up appointment in 4 months.

## 2017-03-07 NOTE — Patient Instructions (Addendum)
check your blood sugar 4 times a day: before the 3 meals, and at bedtime.  also check if you have symptoms of your blood sugar being too high or too low.  please keep a record of the readings and bring it to your next appointment here.  You can write it on any piece of paper.  please call us sooner if your blood sugar goes below 70, or if you have a lot of readings over 200.  Also, please write on your record why you think it is higher or lower: this is the next step in getting your blood sugar better. Please continue the same insulin: 42 units with breakfast (25 if you are going to be active), and 25 units with the evening meal.   Please come back for a follow-up appointment in 4 months.

## 2017-03-07 NOTE — Progress Notes (Signed)
Patient ID: Henry LessGlenn T Jergens Jr., male   DOB: 1941/10/10, 76 y.o.   MRN: [id number]   Subjective: This patient presents today for scheduled visit complaining of toenails that are elongated and thickened and are uncomfortable whenwalking wearing shoesand herequeststoenail debridement.Also, patient is complaining of callouses that uncomfortable on the plantar aspect right and left feet  Objective: Orientated 3 DP pulses 2/4 bilaterally PT pulses 0/4 bilaterally Capillary reflex within normal limits bilaterally Sensation to 10 g monofilament wire intact 4/5 bilaterally Vibratory sensation intact bilaterally Ankle reflex equal and reactive bilaterally Plantar callus fifth MPJ bilaterally Bunionette bilaterally No open skin lesions bilaterally Atrophic skin bilaterally The toenails are hypertrophic, incurvated, discolored, deformity tender direct palpation 6-10  Assessment: Symptomatic onychomycoses 6-10 Diabetic with a history of peripheral arterial disease Plantar callus sub-fifth MPJ, bilaterally Plantar callus heels bilaterally  Plan: Debridement toenails 6-10 mechanically and electrically without any bleeding Debrided plantar calluses 2without anybleeding  Patient is requesting diabetic shoes. I advised patient that his primary care physician will not give a certification for dispensing of diabetic shoes. I recommended that he contact his primary care physician and have him referred to an orthotist for diabetic shoes with custom insoles  Reappoint 61 days

## 2017-03-12 ENCOUNTER — Ambulatory Visit (INDEPENDENT_AMBULATORY_CARE_PROVIDER_SITE_OTHER): Payer: Medicare Other | Admitting: Cardiovascular Disease

## 2017-03-12 ENCOUNTER — Encounter: Payer: Self-pay | Admitting: Cardiovascular Disease

## 2017-03-12 VITALS — BP 134/56 | HR 70 | Ht 70.0 in | Wt 165.0 lb

## 2017-03-12 DIAGNOSIS — N183 Chronic kidney disease, stage 3 (moderate): Secondary | ICD-10-CM

## 2017-03-12 DIAGNOSIS — E1022 Type 1 diabetes mellitus with diabetic chronic kidney disease: Secondary | ICD-10-CM

## 2017-03-12 DIAGNOSIS — I779 Disorder of arteries and arterioles, unspecified: Secondary | ICD-10-CM

## 2017-03-12 DIAGNOSIS — I209 Angina pectoris, unspecified: Secondary | ICD-10-CM | POA: Diagnosis not present

## 2017-03-12 DIAGNOSIS — Z79899 Other long term (current) drug therapy: Secondary | ICD-10-CM

## 2017-03-12 DIAGNOSIS — E785 Hyperlipidemia, unspecified: Secondary | ICD-10-CM

## 2017-03-12 DIAGNOSIS — IMO0002 Reserved for concepts with insufficient information to code with codable children: Secondary | ICD-10-CM

## 2017-03-12 DIAGNOSIS — K219 Gastro-esophageal reflux disease without esophagitis: Secondary | ICD-10-CM | POA: Diagnosis not present

## 2017-03-12 DIAGNOSIS — I341 Nonrheumatic mitral (valve) prolapse: Secondary | ICD-10-CM | POA: Diagnosis not present

## 2017-03-12 DIAGNOSIS — E1065 Type 1 diabetes mellitus with hyperglycemia: Secondary | ICD-10-CM

## 2017-03-12 DIAGNOSIS — I739 Peripheral vascular disease, unspecified: Secondary | ICD-10-CM

## 2017-03-12 DIAGNOSIS — I10 Essential (primary) hypertension: Secondary | ICD-10-CM | POA: Diagnosis not present

## 2017-03-12 DIAGNOSIS — I25119 Atherosclerotic heart disease of native coronary artery with unspecified angina pectoris: Secondary | ICD-10-CM | POA: Diagnosis not present

## 2017-03-12 MED ORDER — EZETIMIBE 10 MG PO TABS: 10.0000 mg | ORAL_TABLET | Freq: Every day | ORAL | 6 refills | Status: DC

## 2017-03-12 NOTE — Progress Notes (Signed)
Patient ID: [name], male   DOB: [birth year], 76 y.o.   MRN: [id number]      HPI: Henry [name]. is a 76 y.o. male who presents to the office today for a cardiology follow-up evaluation.  I last saw him in May 2017.  Henry [name] has a history of brittle type I diabetes. He has CAD and in April 1999 suffered an inferior wall myocardial infarction which initially was complicated by third-degree heart block in the setting of his MI. He underwent PTCA of his RCA. Due to the extensive CAD he ultimately underwent elective CABG surgery. He also has peripheral vascular disease and is status post right carotid endarterectomy. At catheterization March 2011 he was found to have 95% stenosis in the graft supplying his RCA and underwent successful stenting with a 3.0x26 mm integrity stent. A bare metal stent was used in light of his significant diabetic retinopathy with significant bleeding history to reduce his need for long-term dual antiplatelet therapy. Henry [name] has renal insufficiency and is followed by Dr. Dimas Aguas. Laboratory in 2013 showed a creatinine of 1.32.   Henry [name] has experienced several episodes of dizziness and has been felt to have an autonomic neuropathy. He denies frank syncope. Denies recurrent episodes of chest pain. He denies significant shortness of breath.  He tells me recently, his sugars have been very difficult to control. Recent hemoglobin A1c checked by Dr. Loanne Drilling was elevated at 11.4.Henry [name] seems to develop the beginning of right foot paresthesias but most likely her peripheral neuropathy.  In 2015 I recommended a titration of his labetolol for suboptimal blood pressure control, as well as ectopy.  He did have occasional PVCs, predominately unifocal.  However, he did have several episodes of 4 beats of ventricular tachycardia at a rate of 114 and then another episode of 7 beats of nonsustained ventricular tachycardia.   He has had issues with dizziness in the past  resulting in medication reduction.  He is felt to have a peripheral neuropathy and is now on gabapentin.  Remotely, he had developed significant leg edema with amlodipine.  He was unable to tolerate ACE inhibition or ARB therapy.  There also was a possibility of hydralazine-induced leg swelling back in 2011.  He has peripheral vascular disease and is status post right carotid endarterectomy.  Since I last saw him, he was diagnosed with significant PVD with an occluded anterior tibial and posterior tibial arteries on the right, early high-grade left SFA stenosis with an occluded left breast posterior tibial artery.  He has had chronic claudication symptoms but these do not appear to be significantly progressing. He has seen Dr. Gwenlyn Found for PV evaluation and remotely was told that no further therapy with regards intervention was warranted.  Since I last saw him, he denies any episodes of chest tightness.  He admits to occasional episodes of heartburn, particularly after eating certain foods following dinner.  He states his blood pressure at home typically has been ranging in the 1:30 to 140 range.  He has been only pindolol 200 mg twice a day, isosorbide 60 mg and HCTZ 12.5 mg.  He has been taking omeprazole for his reflux.  In the past, he was unable to tolerate Crestor and Lipitor as well as Zocor.  He sees Dr. Renato Shin for his diabetes and Dr. Dimas Aguas for his renal issues.  He presents for evaluation.  Past Medical History:  Diagnosis Date  . ANEMIA-NOS 07/10/2007  . CORONARY ARTERY DISEASE 07/10/2007  .  DIABETES MELLITUS, TYPE I 07/10/2007  . HYPERLIPIDEMIA 07/10/2007  . HYPERTENSION 07/10/2007  . Peripheral arterial disease ([place])   . 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 RENAL INSUFFICIENCY 07/10/2007    Past Surgical History:  Procedure Laterality Date  . APPENDECTOMY    . CARDIAC CATHETERIZATION     stenting in the vein graft to the right coronary artery and had_DES 3x 26  mm Integrity stent inserted, post dilated to 3.4 mm  . CAROTID ENDARTERECTOMY Right    right  . CORONARY ARTERY BYPASS GRAFT  02/2010   PTCA of his right coroary artery.     Allergies  Allergen Reactions  . Hydralazine Swelling    Feet swelled so bad they were cracking Severe foot swelling  . Penicillins Other (See Comments)    Per allergy test.  . Amlodipine Swelling  . Amlodipine Besylate Swelling  . Codeine Other (See Comments)    anxiety  . Lipitor [Atorvastatin Calcium] Other (See Comments)    myalgia  . Valsartan Other (See Comments) and Swelling    Caused kidney damage  . Penicillin G Rash    Current Outpatient Prescriptions  Medication Sig Dispense Refill  . acetaminophen (TYLENOL) 500 MG tablet Take 500 mg by mouth daily as needed (pain).    Marland Kitchen aspirin EC 81 MG tablet Take 81 mg by mouth daily.    . B Complex-C-Folic Acid (FOLBEE PLUS) TABS Take 1 tablet by mouth daily.      . Cholecalciferol (VITAMIN D) 2000 UNITS CAPS Take 2,000 Units by mouth daily.     Marland Kitchen gabapentin (NEURONTIN) 100 MG capsule Take 100 mg by mouth 2 (two) times daily.     . hydrochlorothiazide (MICROZIDE) 12.5 MG capsule Take 12.5 mg by mouth daily.    . insulin NPH-regular Human (NOVOLIN 70/30) (70-30) 100 UNIT/ML injection 42 units with breakfast and 25 units with supper 20 mL 11  . isosorbide mononitrate (IMDUR) 60 MG 24 hr tablet Take 1 tablet (60 mg total) by mouth daily. 30 tablet 1  . labetalol (NORMODYNE) 200 MG tablet Take 200 mg by mouth 2 (two) times daily.    Marland Kitchen omeprazole (PRILOSEC OTC) 20 MG tablet Take 20 mg by mouth daily.     No current facility-administered medications for this visit.     Socially he is married has 2 children and 2 grandchildren. No tobacco history or alcohol use. He is not routinely exercise.  ROS General: Negative; No fevers, chills, or night sweats;  HEENT: He is status post multiple laser procedures for diabetic retinopathy., sinus congestion, difficulty  swallowing Pulmonary: Negative; No cough, wheezing, shortness of breath, hemoptysis Cardiovascular: Negative; No chest pain, presyncope, syncope, palpitations Positive for claudication symptoms  GI: Negative; No nausea, vomiting, diarrhea, or abdominal pain GU: Positive for mild renal insufficiency; No dysuria, hematuria, or difficulty voiding Musculoskeletal: Negative; no myalgias, joint pain, or weakness Hematologic/Oncology: Negative; no easy bruising, bleeding Endocrine: Positive for brittle diabetes no heat/cold intolerance;  Neuro: Positive for neuropathy with paresthesias; no changes in balance, headaches Skin: Negative; No rashes or skin lesions Psychiatric: Negative; No behavioral problems, depression Sleep: Negative; No snoring, daytime sleepiness, hypersomnolence, bruxism, restless legs, hypnogognic hallucinations, no cataplexy Other comprehensive 14 point system review is negative.   PE BP (!) 134/56   Pulse 70   Ht _0  (1.778 m)   Wt 165 lb (74.8 kg)   BMI 23.68 kg/m     Repeat BP by me was 140/60  without significant orthostatic drop.  Wt Readings from Last 3 Encounters:  03/12/17 165 lb (74.8 kg)  03/07/17 168 lb (76.2 kg)  01/05/17 167 lb (75.8 kg)   General: Alert, oriented, no distress.  Skin: normal turgor, no rashes HEENT: Normocephalic, atraumatic. Pupils round and reactive; sclera anicteric;no lid lag.  Nose without nasal septal hypertrophy no xanthelasmas Mouth/Parynx benign; Mallinpatti scale 2 Neck: No JVD; he is status post right carotid endarterectomy. He does have  bilateral bruits left greater than right. Lungs: clear to ausculatation and percussion; no wheezing or rales Chest wall: Nontender to palpation Heart: Regular rate and rhythm in the 60s., s1 s2 normal 2/6 systolic murmur with a diastolic murmur. Abdomen: soft, nontender; no hepatosplenomehaly, BS+; abdominal aorta nontender and not dilated by palpation. No renal artery bruits. Pulses:  Diminished pulses.  Lower extremity Extremities: no clubbing cyanosis or edema, Homan's sign negative  Neurologic: grossly nonfocal Psychological: Normal affect and mood  ECG (independently read by me): Normal sinus rhythm at 71 bpm.  Lateral ST-T changes.  May 2018 ECG (independently read by me): Normal sinus rhythm at 64 bpm.  Inferolateral ST abnormality.  October 2015 ECG (independently read by me): Normal sinus rhythm at 63 beats per minute.  Nonspecific ST changes.  Prior ECG (independently read by me): Sinus rhythm at 59 beats per minute.  Nonspecific ST changes.  Prior 02/04/2014 ECG  (independently read by me): Sinus rhythm at 68 beats per minute with frequent PVCs unifocal.  Prior ECG of July 2014: Sinus rhythm at 64 beats per minute. QTc interval 400 ms. One isolated PAC. Small nondiagnostic Q waves inferiorly with preserved R waves.  LABS:  BMP Latest Ref Rng & Units 05/09/2016 09/23/2015 07/16/2015  Glucose 65 - 99 mg/dL 205(H) 218(H) 96  BUN 7 - 25 mg/dL 33(H) 32(H) 35(H)  Creatinine 0.70 - 1.18 mg/dL 1.30(H) 1.47 1.60(H)  Sodium 135 - 146 mmol/L 140 140 141  Potassium 3.5 - 5.3 mmol/L 4.5 4.2 3.4(L)  Chloride 98 - 110 mmol/L 106 107 114(H)  CO2 20 - 31 mmol/L _0 Calcium 8.6 - 10.3 mg/dL 9.2 9.5 9.6   Hepatic Function Latest Ref Rng & Units 05/09/2016 09/23/2015 02/04/2014  Total Protein 6.1 - 8.1 g/dL 6.6 6.8 6.9  Albumin 3.6 - 5.1 g/dL 3.8 3.9 4.1  AST 10 - 35 U/L _1 ALT 9 - 46 U/L _2 Alk Phosphatase 40 - 115 U/L 67 66 78  Total Bilirubin 0.2 - 1.2 mg/dL 0.5 0.7 0.4  Bilirubin, Direct 0.0 - 0.3 mg/dL - 0.1 -   CBC Latest Ref Rng & Units 05/09/2016 09/23/2015 07/16/2015  WBC 3.8 - 10.8 K/uL 6.2 6.9 8.6  Hemoglobin 13.2 - 17.1 g/dL 11.8(L) 11.5(L) 11.6(L)  Hematocrit 38.5 - 50.0 % 36.4(L) 34.4(L) 34.4(L)  Platelets 140 - 400 K/uL 259 245.0 226   Lab Results  Component Value Date   MCV 91.7 05/09/2016   MCV 91.3 09/23/2015   MCV 90.5 07/16/2015     Lab Results  Component Value Date   TSH 3.87 05/09/2016   Lab Results  Component Value Date   HGBA1C 9.0 03/07/2017   Lipid Panel     Component Value Date/Time   CHOL 162 05/09/2016 0814   TRIG 74 05/09/2016 0814   HDL 45 05/09/2016 0814   CHOLHDL 3.6 05/09/2016 0814   VLDL 15 05/09/2016 0814   LDLCALC 102 05/09/2016 0814     RADIOLOGY: No results found.  IMPRESSION:  No diagnosis found.  ASSESSMENT AND PLAN: Mr. Debow Is a 76 year old Caucasian male who has a history of type 1 diabetes mellitus which has been brittle over the years and has documented nephropathy and retinopathy.  He has significant cardiovascular issues and suffered inferior wall MI, complicated by third-degree heart block in 1999 which was treated successfully with PTCA with resultant significant myocardial salvage. Due to concomitant CAD he underwent CABG surgery with a LIMA to his LAD, vein to diagonal, sequential vein to OM1 and distal circumflex, and SVG to his right coronary artery. He status post stenting of the proximal portion of the vein graft to his right coronary artery in 2011. An  echo Doppler study in February 2014 showed an ejection fraction of 50-55%; mitral annular calcification with mild MR. His right ventricle was dilated. He did have mild TR and mild MR. 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   He has peripheral vascular disease and is status post right carotid endarterectomy.  He also has claudication symptomatology bilaterally in his lower extremity with documented significant occlusion of both the anterior tibial and posterior tibial arteries on the right and has left SFA stenosis with occluded left posterior tibial artery on the left.  He underwent a follow-up echo Doppler study in June 2017 which showed an EF of 50% with basal to mid inferior inferoseptal hypokinesis.  There was grade 2 diastolic dysfunction.  There was bileaflet mitral valve prolapse with  moderate MR and mitral annular calcification.  His left atrium was mildly dilated.  His PA pressure was mildly increased at 41 mm.  He underwent a follow-up nuclear perfusion study in June 2017, which continue to be low risk and showed basal inferoseptal and mid inferoseptal scar without associated ischemia.  EF was 52%.  A carotid duplex study was reviewed with him today and this showed heterogeneous plaque bilaterally with stable 1 a 39% bilateral ICA stenoses and he was status post right carotid endarterectomy.  His blood pressure today is upper normal to minimally increased based on new guidelines on his current medical regimen.  Ventricular rate is controlled.  He is intolerant to statins.  I am starting him on Zetia 10 mg in attempt to achieve an LDL less than 70.  I am recommending repeat blood work be obtained in 4 weeks.  I will see him in 6 months for cardiology reevaluation or sooner if problems arise. Time spent: 25 minutes  Troy Sine, MD, [facility]  03/12/2017 3:15 PM

## 2017-03-12 NOTE — Patient Instructions (Signed)
Your physician recommends that you return for lab work in: 4 weeks.   Your physician has recommended you make the following change in your medication:   1.) start new prescription for generic zetia. This has been sent to your pharmacy.  Your physician wants you to follow-up in: 6 months or sooner if needed. You will receive a reminder letter in the mail two months in advance. If you don't receive a letter, please call our office to schedule the follow-up appointment.  If you need a refill on your cardiac medications before your next appointment, please call your pharmacy.

## 2017-03-29 ENCOUNTER — Telehealth: Payer: Self-pay | Admitting: Cardiovascular Disease

## 2017-03-29 NOTE — Telephone Encounter (Signed)
Please call,pt wants you to know he have  stopped taking Zetia. He was very,very dizzy,pt said he could not walk.,he just can not take any cholesterol medicine,he always have a reaction to them.

## 2017-03-29 NOTE — Telephone Encounter (Signed)
Spoke with pt, he reports the dizziness has resolved. He does not wish to take this again. He will have his labs drawn as planned. zetia added to allergy list. Will make dr Tresa Endo aware.

## 2017-03-30 NOTE — Telephone Encounter (Signed)
Ackowledged.  He may be a candidate for PCSK9 inhibition.

## 2017-04-04 ENCOUNTER — Ambulatory Visit: Payer: Medicare Other | Admitting: Podiatry

## 2017-04-22 ENCOUNTER — Other Ambulatory Visit: Payer: Self-pay | Admitting: Cardiovascular Disease

## 2017-04-23 NOTE — Telephone Encounter (Signed)
REFILL 

## 2017-05-03 DIAGNOSIS — E785 Hyperlipidemia, unspecified: Secondary | ICD-10-CM | POA: Diagnosis not present

## 2017-05-03 DIAGNOSIS — I25119 Atherosclerotic heart disease of native coronary artery with unspecified angina pectoris: Secondary | ICD-10-CM | POA: Diagnosis not present

## 2017-05-03 DIAGNOSIS — N183 Chronic kidney disease, stage 3 (moderate): Secondary | ICD-10-CM | POA: Diagnosis not present

## 2017-05-03 DIAGNOSIS — Z79899 Other long term (current) drug therapy: Secondary | ICD-10-CM | POA: Diagnosis not present

## 2017-05-03 DIAGNOSIS — I10 Essential (primary) hypertension: Secondary | ICD-10-CM | POA: Diagnosis not present

## 2017-05-03 DIAGNOSIS — E1022 Type 1 diabetes mellitus with diabetic chronic kidney disease: Secondary | ICD-10-CM | POA: Diagnosis not present

## 2017-05-03 DIAGNOSIS — E1065 Type 1 diabetes mellitus with hyperglycemia: Secondary | ICD-10-CM | POA: Diagnosis not present

## 2017-05-03 LAB — COMPREHENSIVE METABOLIC PANEL
ALT: 20 U/L (ref 9–46)
AST: 22 U/L (ref 10–35)
Albumin: 3.9 g/dL (ref 3.6–5.1)
Alkaline Phosphatase: 79 U/L (ref 40–115)
BUN: 32 mg/dL — AB (ref 7–25)
CO2: 23 mmol/L (ref 20–31)
Calcium: 9.1 mg/dL (ref 8.6–10.3)
Chloride: 108 mmol/L (ref 98–110)
Creat: 1.43 mg/dL — ABNORMAL HIGH (ref 0.70–1.18)
GLUCOSE: 197 mg/dL — AB (ref 65–99)
POTASSIUM: 4.4 mmol/L (ref 3.5–5.3)
Sodium: 139 mmol/L (ref 135–146)
Total Bilirubin: 0.5 mg/dL (ref 0.2–1.2)
Total Protein: 6.9 g/dL (ref 6.1–8.1)

## 2017-05-03 LAB — CBC
HCT: 38.2 % — ABNORMAL LOW (ref 38.5–50.0)
Hemoglobin: 12.6 g/dL — ABNORMAL LOW (ref 13.2–17.1)
MCH: 30.2 pg (ref 27.0–33.0)
MCHC: 33 g/dL (ref 32.0–36.0)
MCV: 91.6 fL (ref 80.0–100.0)
MPV: 10.6 fL (ref 7.5–12.5)
PLATELETS: 295 10*3/uL (ref 140–400)
RBC: 4.17 MIL/uL — ABNORMAL LOW (ref 4.20–5.80)
RDW: 13.8 % (ref 11.0–15.0)
WBC: 7.5 10*3/uL (ref 3.8–10.8)

## 2017-05-03 LAB — LIPID PANEL
CHOL/HDL RATIO: 4.2 ratio (ref <5.0)
Cholesterol: 167 mg/dL (ref <200)
HDL: 40 mg/dL — ABNORMAL LOW (ref >40)
LDL CALC: 110 mg/dL — AB (ref <100)
TRIGLYCERIDES: 84 mg/dL (ref <150)
VLDL: 17 mg/dL (ref <30)

## 2017-05-03 LAB — TSH: TSH: 2.85 mIU/L (ref 0.40–4.50)

## 2017-05-04 LAB — HEMOGLOBIN A1C
Hgb A1c MFr Bld: 9.2 % — ABNORMAL HIGH (ref <5.7)
Mean Plasma Glucose: 217 mg/dL

## 2017-05-08 ENCOUNTER — Telehealth: Payer: Self-pay | Admitting: *Deleted

## 2017-05-08 NOTE — Telephone Encounter (Signed)
-----   Message from Lennette Biharihomas A Kelly, MD sent at 05/06/2017 11:07 PM EDT ----- Send to Dr. Romero BellingSean Ellison, his endocrinologist who manages his diabetes.  Mild renal insufficiency.  Reportedly he is intolerant to Crestor, Lipitor, and Zocor.  Consider a trial of Livalo 1 mg and if he tolerates, increase to 2 mg.  Consider Zetia 10 mg if unable to take Livalo.

## 2017-05-08 NOTE — Telephone Encounter (Signed)
Patient notified of lab results and recommendations. Patient declined to take anymore medications to lower his cholesterol. He states that he has tried the livalo and it caused him to become extremely dizzy. The zetia caused him to have leg problems. Will notify Dr Tresa Endokelly of patient's decision.

## 2017-05-09 ENCOUNTER — Encounter: Payer: Self-pay | Admitting: Podiatry

## 2017-05-09 ENCOUNTER — Ambulatory Visit (INDEPENDENT_AMBULATORY_CARE_PROVIDER_SITE_OTHER): Payer: Medicare Other | Admitting: Podiatry

## 2017-05-09 DIAGNOSIS — M79676 Pain in unspecified toe(s): Secondary | ICD-10-CM | POA: Diagnosis not present

## 2017-05-09 DIAGNOSIS — L84 Corns and callosities: Secondary | ICD-10-CM | POA: Diagnosis not present

## 2017-05-09 DIAGNOSIS — E1151 Type 2 diabetes mellitus with diabetic peripheral angiopathy without gangrene: Secondary | ICD-10-CM | POA: Diagnosis not present

## 2017-05-09 DIAGNOSIS — I739 Peripheral vascular disease, unspecified: Secondary | ICD-10-CM | POA: Diagnosis not present

## 2017-05-09 DIAGNOSIS — B351 Tinea unguium: Secondary | ICD-10-CM | POA: Diagnosis not present

## 2017-05-09 DIAGNOSIS — E559 Vitamin D deficiency, unspecified: Secondary | ICD-10-CM | POA: Diagnosis not present

## 2017-05-09 DIAGNOSIS — R809 Proteinuria, unspecified: Secondary | ICD-10-CM | POA: Diagnosis not present

## 2017-05-09 DIAGNOSIS — N183 Chronic kidney disease, stage 3 (moderate): Secondary | ICD-10-CM | POA: Diagnosis not present

## 2017-05-09 DIAGNOSIS — I10 Essential (primary) hypertension: Secondary | ICD-10-CM | POA: Diagnosis not present

## 2017-05-09 DIAGNOSIS — N390 Urinary tract infection, site not specified: Secondary | ICD-10-CM | POA: Diagnosis not present

## 2017-05-09 NOTE — Patient Instructions (Signed)

## 2017-05-09 NOTE — Progress Notes (Signed)
Patient ID: Henry LessGlenn T Jacque Jr., male   DOB: 1941-02-15, 76 y.o.   MRN: [id number]    Subjective: This patient presents today for scheduled visit complaining of toenails that are elongated and thickened and are uncomfortable whenwalking wearing shoesand herequeststoenail debridement.Also, patient is complaining of callouses that uncomfortable on the plantar aspect right and left feet  Objective: Orientated 3 DP pulses 2/4 bilaterally PT pulses 0/4 bilaterally Capillary reflex within normal limits bilaterally Sensation to 10 g monofilament wire intact 4/5 bilaterally Vibratory sensation intact bilaterally Ankle reflex equal and reactive bilaterally Plantar callus fifth MPJ bilaterally Bunionette bilaterally No open skin lesions bilaterally Atrophic skin bilaterally Absent hair growth bilaterally Shiny skin bilaterally The toenails are hypertrophic, incurvated, discolored, deformity tender direct palpation 6-10  Assessment: Symptomatic onychomycoses 6-10 Diabetic with a history of peripheral arterial disease Plantar callus sub-fifth MPJ, bilaterally Plantar callus heels bilaterally  Plan: Debridement toenails 6-10 mechanically and electrically without any bleeding Debrided plantar calluses 2without anybleeding  Patient is requesting diabetic shoes. I advised patient that his primary care physician will not give a certification for dispensing of diabetic shoes. I recommended that he contact his primary care physician and have him referred to an orthotist for diabetic shoes with custom insoles  Reappoint 61 days

## 2017-05-17 ENCOUNTER — Emergency Department (HOSPITAL_COMMUNITY): Payer: Medicare Other

## 2017-05-17 ENCOUNTER — Encounter (HOSPITAL_COMMUNITY): Payer: Self-pay | Admitting: Emergency Medicine

## 2017-05-17 ENCOUNTER — Observation Stay (HOSPITAL_COMMUNITY)
Admission: EM | Admit: 2017-05-17 | Discharge: 2017-05-17 | Discharge status: Against Medical Advice | Payer: Medicare Other | Attending: Family Medicine | Admitting: Family Medicine

## 2017-05-17 DIAGNOSIS — R4182 Altered mental status, unspecified: Secondary | ICD-10-CM | POA: Diagnosis not present

## 2017-05-17 DIAGNOSIS — R4781 Slurred speech: Secondary | ICD-10-CM | POA: Diagnosis not present

## 2017-05-17 DIAGNOSIS — Z9861 Coronary angioplasty status: Secondary | ICD-10-CM | POA: Diagnosis not present

## 2017-05-17 DIAGNOSIS — D649 Anemia, unspecified: Secondary | ICD-10-CM | POA: Diagnosis present

## 2017-05-17 DIAGNOSIS — I10 Essential (primary) hypertension: Secondary | ICD-10-CM | POA: Diagnosis present

## 2017-05-17 DIAGNOSIS — R739 Hyperglycemia, unspecified: Secondary | ICD-10-CM | POA: Diagnosis not present

## 2017-05-17 DIAGNOSIS — E10649 Type 1 diabetes mellitus with hypoglycemia without coma: Secondary | ICD-10-CM | POA: Diagnosis not present

## 2017-05-17 DIAGNOSIS — E1065 Type 1 diabetes mellitus with hyperglycemia: Secondary | ICD-10-CM

## 2017-05-17 DIAGNOSIS — T383X5A Adverse effect of insulin and oral hypoglycemic [antidiabetic] drugs, initial encounter: Secondary | ICD-10-CM

## 2017-05-17 DIAGNOSIS — N183 Chronic kidney disease, stage 3 unspecified: Secondary | ICD-10-CM | POA: Diagnosis present

## 2017-05-17 DIAGNOSIS — R404 Transient alteration of awareness: Secondary | ICD-10-CM | POA: Diagnosis present

## 2017-05-17 DIAGNOSIS — I2510 Atherosclerotic heart disease of native coronary artery without angina pectoris: Secondary | ICD-10-CM | POA: Diagnosis present

## 2017-05-17 DIAGNOSIS — Z794 Long term (current) use of insulin: Secondary | ICD-10-CM | POA: Diagnosis not present

## 2017-05-17 DIAGNOSIS — Z888 Allergy status to other drugs, medicaments and biological substances status: Secondary | ICD-10-CM | POA: Diagnosis not present

## 2017-05-17 DIAGNOSIS — R42 Dizziness and giddiness: Secondary | ICD-10-CM | POA: Diagnosis not present

## 2017-05-17 DIAGNOSIS — Z880 Allergy status to penicillin: Secondary | ICD-10-CM | POA: Diagnosis not present

## 2017-05-17 DIAGNOSIS — IMO0002 Reserved for concepts with insufficient information to code with codable children: Secondary | ICD-10-CM | POA: Diagnosis present

## 2017-05-17 DIAGNOSIS — E1029 Type 1 diabetes mellitus with other diabetic kidney complication: Secondary | ICD-10-CM | POA: Diagnosis present

## 2017-05-17 DIAGNOSIS — E161 Other hypoglycemia: Secondary | ICD-10-CM | POA: Diagnosis not present

## 2017-05-17 DIAGNOSIS — Z7982 Long term (current) use of aspirin: Secondary | ICD-10-CM | POA: Diagnosis not present

## 2017-05-17 DIAGNOSIS — E160 Drug-induced hypoglycemia without coma: Secondary | ICD-10-CM | POA: Diagnosis present

## 2017-05-17 LAB — RAPID URINE DRUG SCREEN, HOSP PERFORMED
Amphetamines: NOT DETECTED
Barbiturates: NOT DETECTED
Benzodiazepines: NOT DETECTED
Cocaine: NOT DETECTED
OPIATES: NOT DETECTED
Tetrahydrocannabinol: NOT DETECTED

## 2017-05-17 LAB — APTT: aPTT: 29 seconds (ref 24–36)

## 2017-05-17 LAB — URINALYSIS, ROUTINE W REFLEX MICROSCOPIC
BACTERIA UA: NONE SEEN
BILIRUBIN URINE: NEGATIVE
Glucose, UA: NEGATIVE mg/dL
HGB URINE DIPSTICK: NEGATIVE
KETONES UR: NEGATIVE mg/dL
LEUKOCYTES UA: NEGATIVE
Nitrite: NEGATIVE
PROTEIN: 100 mg/dL — AB
Specific Gravity, Urine: 1.009 (ref 1.005–1.030)
Squamous Epithelial / LPF: NONE SEEN
pH: 6 (ref 5.0–8.0)

## 2017-05-17 LAB — I-STAT CHEM 8, ED
BUN: 45 mg/dL — AB (ref 6–20)
CALCIUM ION: 1.2 mmol/L (ref 1.15–1.40)
CHLORIDE: 107 mmol/L (ref 101–111)
Creatinine, Ser: 1.5 mg/dL — ABNORMAL HIGH (ref 0.61–1.24)
GLUCOSE: 296 mg/dL — AB (ref 65–99)
HCT: 35 % — ABNORMAL LOW (ref 39.0–52.0)
Hemoglobin: 11.9 g/dL — ABNORMAL LOW (ref 13.0–17.0)
Potassium: 4.3 mmol/L (ref 3.5–5.1)
Sodium: 139 mmol/L (ref 135–145)
TCO2: 25 mmol/L (ref 0–100)

## 2017-05-17 LAB — COMPREHENSIVE METABOLIC PANEL
ALT: 21 U/L (ref 17–63)
ANION GAP: 9 (ref 5–15)
AST: 31 U/L (ref 15–41)
Albumin: 3.8 g/dL (ref 3.5–5.0)
Alkaline Phosphatase: 75 U/L (ref 38–126)
BILIRUBIN TOTAL: 0.9 mg/dL (ref 0.3–1.2)
BUN: 33 mg/dL — ABNORMAL HIGH (ref 6–20)
CO2: 21 mmol/L — ABNORMAL LOW (ref 22–32)
Calcium: 9 mg/dL (ref 8.9–10.3)
Chloride: 107 mmol/L (ref 101–111)
Creatinine, Ser: 1.65 mg/dL — ABNORMAL HIGH (ref 0.61–1.24)
GFR calc non Af Amer: 39 mL/min — ABNORMAL LOW (ref >60)
GFR, EST AFRICAN AMERICAN: 45 mL/min — AB (ref >60)
Glucose, Bld: 299 mg/dL — ABNORMAL HIGH (ref 65–99)
Potassium: 4.1 mmol/L (ref 3.5–5.1)
Sodium: 137 mmol/L (ref 135–145)
TOTAL PROTEIN: 7 g/dL (ref 6.5–8.1)

## 2017-05-17 LAB — CBG MONITORING, ED
Glucose-Capillary: 116 mg/dL — ABNORMAL HIGH (ref 65–99)
Glucose-Capillary: 167 mg/dL — ABNORMAL HIGH (ref 65–99)
Glucose-Capillary: 273 mg/dL — ABNORMAL HIGH (ref 65–99)

## 2017-05-17 LAB — DIFFERENTIAL
Basophils Absolute: 0 10*3/uL (ref 0.0–0.1)
Basophils Relative: 0 %
EOS PCT: 1 %
Eosinophils Absolute: 0.1 10*3/uL (ref 0.0–0.7)
LYMPHS ABS: 1.4 10*3/uL (ref 0.7–4.0)
Lymphocytes Relative: 18 %
MONO ABS: 0.4 10*3/uL (ref 0.1–1.0)
MONOS PCT: 4 %
Neutro Abs: 6.2 10*3/uL (ref 1.7–7.7)
Neutrophils Relative %: 77 %

## 2017-05-17 LAB — CBC
HEMATOCRIT: 36.1 % — AB (ref 39.0–52.0)
HEMOGLOBIN: 11.9 g/dL — AB (ref 13.0–17.0)
MCH: 29.5 pg (ref 26.0–34.0)
MCHC: 33 g/dL (ref 30.0–36.0)
MCV: 89.4 fL (ref 78.0–100.0)
Platelets: 263 10*3/uL (ref 150–400)
RBC: 4.04 MIL/uL — ABNORMAL LOW (ref 4.22–5.81)
RDW: 13.5 % (ref 11.5–15.5)
WBC: 8 10*3/uL (ref 4.0–10.5)

## 2017-05-17 LAB — I-STAT TROPONIN, ED: Troponin i, poc: 0 ng/mL (ref 0.00–0.08)

## 2017-05-17 LAB — PROTIME-INR
INR: 1.08
Prothrombin Time: 14 seconds (ref 11.4–15.2)

## 2017-05-17 MED ORDER — HYDRALAZINE HCL 20 MG/ML IJ SOLN
10.0000 mg | INTRAMUSCULAR | Status: AC
  Administered 2017-05-17: 10 mg via INTRAVENOUS
  Filled 2017-05-17: qty 1

## 2017-05-17 NOTE — Progress Notes (Signed)
Henry [name] is 76 year old gentleman with history of insulin-dependent diabetes mellitus and coronary artery disease, presenting to the emergency department after an episode at home that was marked by confusion, diaphoresis, and amnesia. He was noted to be hypoglycemic, was treated with juice and glucose tabs, and has made a full recovery back to his normal state. Patient reports history of hypoglycemic events in the past and states that the episode earlier today was different than those. He denies any headache, change in vision or hearing, or focal numbness or weakness. The episode was not witnessed, but there is no incontinence or injury.  Neurology was consulted by the ED physician for evaluation of this with concern for possible seizure or TIA. Medicine was asked to admit.  Plan was to observe the patient on telemetry with neurology consultation, but the patient has elected to leave AMA. He was strongly encouraged to stay, told that the nature of the event remains uncertain and he is at risk for another episode, which could result in grave disability or death.

## 2017-05-17 NOTE — ED Triage Notes (Addendum)
Pt arrive from home via [facility] reporting hypoglycemia today that responded slowly to interventions.  EMs reports pt found AMS, diaphoretic. Pt given juice and glu tabs, EMS reports pt remained lethargic.  EMS reports initial CBG 59, after food and juice CBG improved to 96. Pt hypertensive, denies HA. CBG 116 on arrival, Pt AOx4.

## 2017-05-17 NOTE — ED Provider Notes (Signed)
[facility]-EMERGENCY DEPT Provider Note   CSN: [id number] Arrival date & time: 05/17/17  1359     History   Chief Complaint Chief Complaint  Patient presents with  . Hypoglycemia    HPI Henry Cho. is a 76 y.o. male.There is a very pleasant 76 year old male who presents to the emergency department with chief complaint of altered mental status. He is a type I diabetic. He has a history of arterial disease, renal insufficiency, CAD, hyperlipidemia, and hypertension. He has a previous history of carotid endarterectomy. The patient states that around 11 AM. A neighbor came to his home to give him a card. He states when he turned around, he felt extremely funny and thought he might be having a hypoglycemic event. He traveled upstairs, but when he got to the top is the stairs. He sat in a chair. He is amnestic to the events that took place after that for approximately 3 hours. According to his wife. She called the house around 12:00 AM. At that point. Her husband picked up the phone and he was slurring his speech and his speech was nonsensical. She thought he might be having a hypoglycemic event and called a neighbor who had a key. But the time she arrived to the house. The neighbor was trying to have the patient swallow some orange juice, however, he didn't seem to be able to comprehend what was being asked of him and he would just let the juice drop out of his mouth. He did not appear to have a facial droop , unilateral weakness, or gaze preference. His wife states that she kept trying to shake him and speak to him, but however, he was slurring his speech and instead of saying words, was counting numbers. She states that he didn't seem to be losing consciousness, but would not make eye contact and seemed almost like he was asleep was sitting in the chair. Around 1:00, they called EMS. Henry [name] states the first that he thinks he remembers is the firemen hovering over him at his home. He states that he  became more coherent as they were traveling to the emergency department. Patient states that he has had type 1 diabetes for 55 years and has never had an episode of hypoglycemia that is equivalent to what occurred today. He has taken his medications as he normally does. He is currently back to baseline.  HPI  Past Medical History:  Diagnosis Date  . ANEMIA-NOS 07/10/2007  . CORONARY ARTERY DISEASE 07/10/2007  . DIABETES MELLITUS, TYPE I 07/10/2007  . HYPERLIPIDEMIA 07/10/2007  . HYPERTENSION 07/10/2007  . Peripheral arterial disease (HCC)   . 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 RENAL INSUFFICIENCY 07/10/2007    Patient Active Problem List   Diagnosis Date Noted  . Peripheral arterial disease (HCC) 11/04/2014  . Claudication (HCC) 09/24/2014  . Premature ventricular contractions (PVCs) (VPCs) 04/06/2014  . NSVT (nonsustained ventricular tachycardia) (HCC) 04/06/2014  . Diabetic peripheral neuropathy (HCC) 02/04/2014  . Dizziness 07/10/2013  . Diabetes (HCC) 06/13/2013  . 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 Screening for prostate cancer 05/12/2011  . Pure hypercholesterolemia 05/12/2011  . Encounter for long-term (current) use of other medications 05/12/2011  . 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 PROLIFERATIVE DIABETIC RETINOPATHY 05/05/2010  . Dyslipidemia 07/10/2007  . Anemia 07/10/2007  . Essential hypertension 07/10/2007  . Coronary atherosclerosis 07/10/2007  . Disorder resulting from impaired renal function 07/10/2007    Past Surgical History:  Procedure Laterality Date  . APPENDECTOMY    . CARDIAC CATHETERIZATION     stenting in the vein graft to the right coronary artery and had_DES 3x 26 mm Integrity stent inserted, post dilated to 3.4 mm  . CAROTID ENDARTERECTOMY Right    right  . CORONARY ARTERY BYPASS GRAFT  02/2010   PTCA of his right coroary artery.        Home Medications    Prior to  Admission medications   Medication Sig Start Date End Date Taking? Authorizing Provider  acetaminophen (TYLENOL) 500 MG tablet Take 500 mg by mouth daily as needed (pain).    [provider]  aspirin EC 81 MG tablet Take 81 mg by mouth daily.    [provider]  B Complex-C-Folic Acid (FOLBEE PLUS) TABS Take 1 tablet by mouth daily.      [provider]  Cholecalciferol (VITAMIN D) 2000 UNITS CAPS Take 2,000 Units by mouth daily.     [provider]  gabapentin (NEURONTIN) 100 MG capsule Take 100 mg by mouth 2 (two) times daily.     [provider]  hydrochlorothiazide (MICROZIDE) 12.5 MG capsule Take 12.5 mg by mouth daily.    [provider]  insulin NPH-regular Human (NOVOLIN 70/30) (70-30) 100 UNIT/ML injection 42 units with breakfast and 25 units with supper 10/06/16   Romero BellingEllison, Sean, MD  isosorbide mononitrate (IMDUR) 60 MG 24 hr tablet TAKE 1 TABLET BY MOUTH DAILY 04/23/17   Lennette BihariKelly, Thomas A, MD  labetalol (NORMODYNE) 200 MG tablet Take 200 mg by mouth 2 (two) times daily.    [provider]  omeprazole (PRILOSEC OTC) 20 MG tablet Take 20 mg by mouth daily.    [provider]    Family History Family History  Problem Relation Age of Onset  . Heart disease Mother   . Cancer Neg Hx     Social History Social History  Substance Use Topics  . Smoking status: Former Games developermoker  . Smokeless tobacco: Never Used  . Alcohol use No     Allergies   Hydralazine; Penicillins; Amlodipine; Amlodipine besylate; Codeine; Lipitor [atorvastatin calcium]; Valsartan; Zetia [ezetimibe]; and Penicillin g   Review of Systems Review of Systems Ten systems reviewed and are negative for acute change, except as noted in the HPI.    Physical Exam Updated Vital Signs BP (!) 207/69 (BP Location: Right Arm)   Pulse (!) 59   Resp 20   SpO2 99%   Physical Exam  Constitutional: He appears well-developed and well-nourished. No  distress.  HENT:  Head: Normocephalic and atraumatic.  Eyes: Conjunctivae are normal. No scleral icterus.  Neck: Normal range of motion. Neck supple.  Cardiovascular: Normal rate, regular rhythm and normal heart sounds.   Pulmonary/Chest: Effort normal and breath sounds normal. No respiratory distress.  Abdominal: Soft. There is no tenderness.  Musculoskeletal: He exhibits no edema.  Neurological: He is alert.  Speech is clear and goal oriented, follows commands Major Cranial nerves without deficit, no facial droop Normal strength in upper and lower extremities bilaterally including dorsiflexion and plantar flexion, strong and equal grip strength Sensation normal to light and sharp touch Moves extremities without ataxia, coordination intact, HOWEVER INTENTION TREMOR IS NOTED Normal finger to nose and rapid alternating movements Neg romberg, no pronator drift Normal gait Normal heel-shin and balance  Skin: Skin is warm and dry. He is not diaphoretic.  Psychiatric: His behavior is normal.  Nursing note and vitals reviewed.  ED Treatments / Results  Labs (all labs ordered are listed, but only abnormal results are displayed) Labs Reviewed  CBG MONITORING, ED - Abnormal; Notable for the following:       Result Value   Glucose-Capillary 116 (*)    All other components within normal limits  CBG MONITORING, ED - Abnormal; Notable for the following:    Glucose-Capillary 167 (*)    All other components within normal limits  ETHANOL  PROTIME-INR  APTT  CBC  DIFFERENTIAL  COMPREHENSIVE METABOLIC PANEL  RAPID URINE DRUG SCREEN, HOSP PERFORMED  URINALYSIS, ROUTINE W REFLEX MICROSCOPIC  I-STAT CHEM 8, ED  I-STAT TROPOININ, ED    EKG  EKG Interpretation None       Radiology No results found.  Procedures Procedures (including critical care time)  Medications Ordered in ED Medications - No data to display   Initial Impression / Assessment and Plan / ED Course  I have  reviewed the triage vital signs and the nursing notes.  Pertinent labs & imaging results that were available during my care of the patient were reviewed by me and considered in my medical decision making (see chart for details).     Patient here with transient alteration in awareness. He did have some hypoglycemia. This spoke with Dr. Otelia Limes who feels the patient may have had a seizure. Patient has been stable and at baseline throughout his emergency visit. He will be admitted by try at hospitalist for further workup. Patient seen in shared visit with attending physician. Who agrees with assessment, work up , treatment, and plan for admission  8:27 PM Date: 05/17/2017 Patient: Henry [name]. Admitted: 05/17/2017  1:59 PM Attending Provider: Briscoe Deutscher, MD  Henry Less. or his authorized caregiver has made the decision for the patient to leave the emergency department against the advice of Opyd, Lavone Neri, MD.  He or his authorized caregiver has been informed and understands the inherent risks, including death.  He or his authorized caregiver has decided to accept the responsibility for this decision. Henry Less. and all necessary parties have been advised that he may return for further evaluation or treatment. His condition at time of discharge was Stable.  Henry Less. had current vital signs as follows:  Blood pressure (!) 217/61, pulse 65, temperature 97.5 F (36.4 C), temperature source Oral, resp. rate 18, SpO2 98 %.   Henry Less. or his authorized caregiver has  signed the Leaving Against Medical Advice form prior to leaving the department.  Arthor Captain 05/17/2017     Final Clinical Impressions(s) / ED Diagnoses   Final diagnoses:  Transient alteration of awareness    New Prescriptions New Prescriptions   No medications on file     Arthor Captain, PA-C 05/17/17 1945    Arthor Captain, PA-C 05/22/17 [id number]    Tegeler, Canary Brim,  MD 05/23/17 [phone]

## 2017-05-17 NOTE — Discharge Instructions (Signed)
Please return at any time for reevaluation or if you change your mind and wish to be admitted. Unfortunately medicare does regulate what qualifies for full admission. We are highly recommending you stay and continue the work up. Please understand that there is significant risk that includes worsening condition or up to and including death. You have the capacity to make the decision to leave, but please feel free to return at any time.

## 2017-05-17 NOTE — ED Notes (Signed)
Patient transported to CT 

## 2017-05-25 ENCOUNTER — Other Ambulatory Visit: Payer: Self-pay | Admitting: Cardiovascular Disease

## 2017-05-25 NOTE — Telephone Encounter (Signed)
REFILL 

## 2017-05-28 DIAGNOSIS — H47293 Other optic atrophy, bilateral: Secondary | ICD-10-CM | POA: Diagnosis not present

## 2017-05-28 DIAGNOSIS — Z794 Long term (current) use of insulin: Secondary | ICD-10-CM | POA: Diagnosis not present

## 2017-05-28 DIAGNOSIS — H43811 Vitreous degeneration, right eye: Secondary | ICD-10-CM | POA: Diagnosis not present

## 2017-05-28 DIAGNOSIS — H35041 Retinal micro-aneurysms, unspecified, right eye: Secondary | ICD-10-CM | POA: Diagnosis not present

## 2017-05-28 DIAGNOSIS — Z79899 Other long term (current) drug therapy: Secondary | ICD-10-CM | POA: Diagnosis not present

## 2017-05-28 DIAGNOSIS — H02831 Dermatochalasis of right upper eyelid: Secondary | ICD-10-CM | POA: Diagnosis not present

## 2017-05-28 DIAGNOSIS — H02834 Dermatochalasis of left upper eyelid: Secondary | ICD-10-CM | POA: Diagnosis not present

## 2017-05-28 DIAGNOSIS — H53483 Generalized contraction of visual field, bilateral: Secondary | ICD-10-CM | POA: Diagnosis not present

## 2017-05-28 DIAGNOSIS — I252 Old myocardial infarction: Secondary | ICD-10-CM | POA: Diagnosis not present

## 2017-05-28 DIAGNOSIS — Z888 Allergy status to other drugs, medicaments and biological substances status: Secondary | ICD-10-CM | POA: Diagnosis not present

## 2017-05-28 DIAGNOSIS — Z885 Allergy status to narcotic agent status: Secondary | ICD-10-CM | POA: Diagnosis not present

## 2017-05-28 DIAGNOSIS — N189 Chronic kidney disease, unspecified: Secondary | ICD-10-CM | POA: Diagnosis not present

## 2017-05-28 DIAGNOSIS — Z7982 Long term (current) use of aspirin: Secondary | ICD-10-CM | POA: Diagnosis not present

## 2017-05-28 DIAGNOSIS — Z9849 Cataract extraction status, unspecified eye: Secondary | ICD-10-CM | POA: Diagnosis not present

## 2017-05-28 DIAGNOSIS — E1022 Type 1 diabetes mellitus with diabetic chronic kidney disease: Secondary | ICD-10-CM | POA: Diagnosis not present

## 2017-05-28 DIAGNOSIS — Z880 Allergy status to penicillin: Secondary | ICD-10-CM | POA: Diagnosis not present

## 2017-05-28 DIAGNOSIS — I129 Hypertensive chronic kidney disease with stage 1 through stage 4 chronic kidney disease, or unspecified chronic kidney disease: Secondary | ICD-10-CM | POA: Diagnosis not present

## 2017-05-28 DIAGNOSIS — E103513 Type 1 diabetes mellitus with proliferative diabetic retinopathy with macular edema, bilateral: Secondary | ICD-10-CM | POA: Diagnosis not present

## 2017-05-28 DIAGNOSIS — E1040 Type 1 diabetes mellitus with diabetic neuropathy, unspecified: Secondary | ICD-10-CM | POA: Diagnosis not present

## 2017-05-28 DIAGNOSIS — Z961 Presence of intraocular lens: Secondary | ICD-10-CM | POA: Diagnosis not present

## 2017-06-14 NOTE — Telephone Encounter (Signed)
If he cannot take any statin and cannot take Zetia with his type 1 diabetes mellitus and CAD .  Consider PCSK9 inhibition with Repatha.  If he is willing can see pharmacist so that we can try to get approval for this

## 2017-07-06 ENCOUNTER — Encounter: Payer: Self-pay | Admitting: Endocrinology

## 2017-07-06 ENCOUNTER — Other Ambulatory Visit: Payer: Medicare Other

## 2017-07-06 ENCOUNTER — Ambulatory Visit (INDEPENDENT_AMBULATORY_CARE_PROVIDER_SITE_OTHER): Payer: Medicare Other | Admitting: Endocrinology

## 2017-07-06 VITALS — BP 130/70 | HR 69 | Ht 70.0 in | Wt 161.0 lb

## 2017-07-06 DIAGNOSIS — I25119 Atherosclerotic heart disease of native coronary artery with unspecified angina pectoris: Secondary | ICD-10-CM

## 2017-07-06 DIAGNOSIS — E1022 Type 1 diabetes mellitus with diabetic chronic kidney disease: Secondary | ICD-10-CM

## 2017-07-06 LAB — POCT GLYCOSYLATED HEMOGLOBIN (HGB A1C): Hemoglobin A1C: 10.2

## 2017-07-06 NOTE — Patient Instructions (Addendum)
check your blood sugar 4 times a day: before the 3 meals, and at bedtime.  also check if you have symptoms of your blood sugar being too high or too low.  please keep a record of the readings and bring it to your next appointment here.  You can write it on any piece of paper.  please call us sooner if your blood sugar goes below 70, or if you have a lot of readings over 200.  Also, please write on your record why you think it is higher or lower: this is the next step in getting your blood sugar better. Please continue the same insulin: 42 units with breakfast (25 if you are going to be active), and 25 units with the evening meal.   blood tests are requested for you today.  We'll let you know about the results. For your leg, keep it covered with antibiotic ointment and a large bandaid. If this doesn't help, the next step is to see a wound-care specialist.   Please come back for a follow-up appointment in 3 months.

## 2017-07-06 NOTE — Progress Notes (Signed)
Subjective:    Patient ID: Henry LessGlenn T Shreeve Jr., male    DOB: 08/04/1941, 76 y.o.   MRN: [id number]  HPI Pt returns for f/u of diabetes mellitus: DM type: 1 Dx'ed: 1961 Complications: renal insufficiency, CAD, PAD, and retinopathy.   Therapy: insulin since dx.  DKA: never.   Severe hypoglycemia: last episode was approx 2008.   Pancreatitis: never.  Other: after poor results with multiple daily injections, he was changed to 70/30; he has declined pump and continuous glucose monitor; pt says he cannot afford insulin analogs.  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  Interval history: He brings a record of his cbg's which I have reviewed today.  It varies from 83-289.  There is no trend throughout the day.  Pt states 19 years of recurrent ulcer at the left lower leg.  He says it has not changed in size.  No assoc itching.  Past Medical History:  Diagnosis Date  . ANEMIA-NOS 07/10/2007  . CORONARY ARTERY DISEASE 07/10/2007  . DIABETES MELLITUS, TYPE I 07/10/2007  . HYPERLIPIDEMIA 07/10/2007  . HYPERTENSION 07/10/2007  . Peripheral arterial disease (HCC)   . 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 RENAL INSUFFICIENCY 07/10/2007    Past Surgical History:  Procedure Laterality Date  . APPENDECTOMY    . CARDIAC CATHETERIZATION     stenting in the vein graft to the right coronary artery and had_DES 3x 26 mm Integrity stent inserted, post dilated to 3.4 mm  . CAROTID ENDARTERECTOMY Right    right  . CORONARY ARTERY BYPASS GRAFT  02/2010   PTCA of his right coroary artery.     Social History   Social History  . Marital status: Married    Spouse name: N/A  . Number of children: N/A  . Years of education: N/A   Occupational History  . Not on file.   Social History Main Topics  . Smoking status: Former Games developermoker  . Smokeless tobacco: Never Used  . Alcohol use No  . Drug use: No  . Sexual activity: Not on file   Other Topics Concern  .  Not on file   Social History Narrative   Says his diet and exercise are very good    Current Outpatient Prescriptions on File Prior to Visit  Medication Sig Dispense Refill  . acetaminophen (TYLENOL) 500 MG tablet Take 500 mg by mouth daily as needed (pain).    Marland Kitchen. aspirin EC 81 MG tablet Take 81 mg by mouth daily.    . B Complex-C-Folic Acid (FOLBEE PLUS) TABS Take 1 tablet by mouth daily.      . Cholecalciferol (VITAMIN D) 2000 UNITS CAPS Take 2,000 Units by mouth daily.     Marland Kitchen. gabapentin (NEURONTIN) 100 MG capsule Take 100 mg by mouth 2 (two) times daily.     . hydrochlorothiazide (MICROZIDE) 12.5 MG capsule Take 12.5 mg by mouth daily.    . insulin NPH-regular Human (NOVOLIN 70/30) (70-30) 100 UNIT/ML injection 42 units with breakfast and 25 units with supper 20 mL 11  . isosorbide mononitrate (IMDUR) 60 MG 24 hr tablet TAKE 1 TABLET BY MOUTH DAILY 30 tablet 6  . labetalol (NORMODYNE) 200 MG tablet TAKE 1 TABLET(200 MG) BY MOUTH TWICE DAILY 60 tablet 5  . omeprazole (PRILOSEC OTC) 20 MG tablet Take 20 mg by mouth daily.     No current facility-administered medications on file prior to visit.     Allergies  Allergen Reactions  . Hydralazine  Swelling    Feet swelled so bad they were cracking Severe foot swelling  . Penicillins Other (See Comments)    Per allergy test.  . Amlodipine Swelling  . Amlodipine Besylate Swelling  . Codeine Other (See Comments)    anxiety  . Lipitor [Atorvastatin Calcium] Other (See Comments)    myalgia  . Valsartan Other (See Comments) and Swelling    Caused kidney damage  . Zetia [Ezetimibe] Other (See Comments)    Severe dizziness  . Penicillin G Rash    Family History  Problem Relation Age of Onset  . Heart disease Mother   . Cancer Neg Hx     BP 130/70   Pulse 69   Ht 5\' 10"  (1.778 m)   Wt 161 lb (73 kg)   SpO2 97%   BMI 23.10 kg/m   Review of Systems Denies fever and leg swelling    Objective:   Physical Exam VITAL SIGNS:   See vs page.  GENERAL: no distress.  Pulses: dorsalis pedis intact bilat.  Skin: feet are of normal temp. there is a 2 cm ulcer at the left leg. There is patchy hyperpigmentation. There is an old healed vein harvest scar at the left leg.   Feet: no deformity. There is bilateral onychomycosis.  CV: no leg edema.  Neuro: sensation is intact to touch on the feet, but decreased from normal.    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  Chronic intermitt leg ulcer, worse.    Patient Instructions  check your blood sugar 4 times a day: before the 3 meals, and at bedtime.  also check if you have symptoms of your blood sugar being too high or too low.  please keep a record of the readings and bring it to your next appointment here.  You can write it on any piece of paper.  please call us sooner if your blood sugar goes below 70, or if you have a lot of readings over 200.  Also, please write on your record why you think it is higher or lower: this is the next step in getting your blood sugar better. Please continue the same insulin: 42 units with breakfast (25 if you are going to be active), and 25 units with the evening meal.   blood tests are requested for you today.  We'll let you know about the results. For your leg, keep it covered with antibiotic ointment and a large bandaid. If this doesn't help, the next step is to see a wound-care specialist.   Please come back for a follow-up appointment in 3 months.

## 2017-07-09 LAB — FRUCTOSAMINE: Fructosamine: 453 umol/L — ABNORMAL HIGH (ref 190–270)

## 2017-07-11 ENCOUNTER — Encounter: Payer: Self-pay | Admitting: Podiatry

## 2017-07-11 ENCOUNTER — Ambulatory Visit (INDEPENDENT_AMBULATORY_CARE_PROVIDER_SITE_OTHER): Payer: Medicare Other | Admitting: Podiatry

## 2017-07-11 DIAGNOSIS — L84 Corns and callosities: Secondary | ICD-10-CM

## 2017-07-11 DIAGNOSIS — M79676 Pain in unspecified toe(s): Secondary | ICD-10-CM | POA: Diagnosis not present

## 2017-07-11 DIAGNOSIS — I739 Peripheral vascular disease, unspecified: Secondary | ICD-10-CM

## 2017-07-11 DIAGNOSIS — B351 Tinea unguium: Secondary | ICD-10-CM

## 2017-07-11 DIAGNOSIS — E1151 Type 2 diabetes mellitus with diabetic peripheral angiopathy without gangrene: Secondary | ICD-10-CM | POA: Diagnosis not present

## 2017-07-11 NOTE — Progress Notes (Signed)
Patient ID: Henry LessGlenn T Arntz Jr., male   DOB: [birth year], 76 y.o.   MRN: [id number]    Subjective: This patient presents today for scheduled visit complaining of toenails that are elongated and thickened and are uncomfortable whenwalking wearing shoesand herequeststoenail debridement.Also, patient is complaining of callouses that uncomfortable on the plantar aspect right and left feet  Objective: Orientated 3 DP pulses 2/4 bilaterally PT pulses 0/4 bilaterally Capillary reflex within normal limits bilaterally Sensation to 10 g monofilament wire intact 4/5 bilaterally Vibratory sensation intact bilaterally Ankle reflex equal and reactive bilaterally Plantar callus fifth MPJ bilaterally Bunionette bilaterally No open skin lesions bilaterally Atrophic skin bilaterally Absent hair growth bilaterally Shiny skin bilaterally The toenails are hypertrophic, incurvated, discolored, deformity tender direct palpation 6-10  Assessment: Symptomatic onychomycoses 6-10 Diabetic with a history of peripheral arterial disease Plantar callus sub-fifth MPJ, bilaterally Plantar callus heels bilaterally  Plan: Debridement toenails 6-10 mechanically and electrically without any bleeding Debrided plantar calluses 2without anybleeding  Patient is requesting diabetic shoes. I advised patient that his primary care physician will not give a certification for dispensing of diabetic shoes. I recommended that he contact his primary care physician and have him referred to an orthotist for diabetic shoes with custom insoles  Reappoint 61 days

## 2017-07-11 NOTE — Patient Instructions (Signed)

## 2017-07-26 DIAGNOSIS — E11311 Type 2 diabetes mellitus with unspecified diabetic retinopathy with macular edema: Secondary | ICD-10-CM | POA: Diagnosis not present

## 2017-07-26 DIAGNOSIS — E103513 Type 1 diabetes mellitus with proliferative diabetic retinopathy with macular edema, bilateral: Secondary | ICD-10-CM | POA: Diagnosis not present

## 2017-07-26 DIAGNOSIS — Z961 Presence of intraocular lens: Secondary | ICD-10-CM | POA: Diagnosis not present

## 2017-08-06 ENCOUNTER — Encounter: Payer: Self-pay | Admitting: Neurology

## 2017-08-06 ENCOUNTER — Encounter: Payer: Self-pay | Admitting: Endocrinology

## 2017-08-06 ENCOUNTER — Ambulatory Visit (INDEPENDENT_AMBULATORY_CARE_PROVIDER_SITE_OTHER): Payer: Medicare Other | Admitting: Endocrinology

## 2017-08-06 VITALS — BP 140/78 | HR 65 | Wt 159.8 lb

## 2017-08-06 DIAGNOSIS — E1022 Type 1 diabetes mellitus with diabetic chronic kidney disease: Secondary | ICD-10-CM | POA: Diagnosis not present

## 2017-08-06 DIAGNOSIS — R6889 Other general symptoms and signs: Secondary | ICD-10-CM | POA: Diagnosis not present

## 2017-08-06 DIAGNOSIS — I25119 Atherosclerotic heart disease of native coronary artery with unspecified angina pectoris: Secondary | ICD-10-CM | POA: Diagnosis not present

## 2017-08-06 LAB — POCT GLYCOSYLATED HEMOGLOBIN (HGB A1C): Hemoglobin A1C: 10.2

## 2017-08-06 NOTE — Progress Notes (Signed)
Subjective:    Patient ID: Henry LessGlenn T Brocker Jr., male    DOB: November 06, 1941, 76 y.o.   MRN: [id number]  HPI Pt returns for f/u of diabetes mellitus: DM type: 1 Dx'ed: 1961 Complications: renal insufficiency, CAD, PAD, and retinopathy.   Therapy: insulin since dx.  DKA: never.   Severe hypoglycemia: last episode was 2016.   Pancreatitis: never.  Other: after poor results with multiple daily injections, he was changed to 70/30; he has declined pump and continuous glucose monitor; pt says he cannot afford insulin analogs.  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  Interval history: cbg's continue to be variable.  He brings a record of his cbg's which I have reviewed today.  It varies from 83-289.  There is no trend throughout the day.  5 days ago, he had episode x 1 HR, of slight "heaviness" sensation at the left side of the face, but no assoc LOC.  He is unable to cite precip factor, but it happend as he was getting out of bed in the AM.  He checked cbg (100's) and BP (normal).  sxs resolved without rx.   Past Medical History:  Diagnosis Date  . ANEMIA-NOS 07/10/2007  . CORONARY ARTERY DISEASE 07/10/2007  . DIABETES MELLITUS, TYPE I 07/10/2007  . HYPERLIPIDEMIA 07/10/2007  . HYPERTENSION 07/10/2007  . Peripheral arterial disease (HCC)   . 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 RENAL INSUFFICIENCY 07/10/2007    Past Surgical History:  Procedure Laterality Date  . APPENDECTOMY    . CARDIAC CATHETERIZATION     stenting in the vein graft to the right coronary artery and had_DES 3x 26 mm Integrity stent inserted, post dilated to 3.4 mm  . CAROTID ENDARTERECTOMY Right    right  . CORONARY ARTERY BYPASS GRAFT  02/2010   PTCA of his right coroary artery.     Social History   Social History  . Marital status: Married    Spouse name: N/A  . Number of children: N/A  . Years of education: N/A   Occupational History  . Not on file.   Social  History Main Topics  . Smoking status: Former Games developermoker  . Smokeless tobacco: Never Used  . Alcohol use No  . Drug use: No  . Sexual activity: Not on file   Other Topics Concern  . Not on file   Social History Narrative   Says his diet and exercise are very good    Current Outpatient Prescriptions on File Prior to Visit  Medication Sig Dispense Refill  . acetaminophen (TYLENOL) 500 MG tablet Take 500 mg by mouth daily as needed (pain).    Marland Kitchen. aspirin EC 81 MG tablet Take 81 mg by mouth daily.    . B Complex-C-Folic Acid (FOLBEE PLUS) TABS Take 1 tablet by mouth daily.      . Cholecalciferol (VITAMIN D) 2000 UNITS CAPS Take 2,000 Units by mouth daily.     Marland Kitchen. gabapentin (NEURONTIN) 100 MG capsule Take 100 mg by mouth 2 (two) times daily.     . hydrochlorothiazide (MICROZIDE) 12.5 MG capsule Take 12.5 mg by mouth daily.    . insulin NPH-regular Human (NOVOLIN 70/30) (70-30) 100 UNIT/ML injection 42 units with breakfast and 25 units with supper 20 mL 11  . isosorbide mononitrate (IMDUR) 60 MG 24 hr tablet TAKE 1 TABLET BY MOUTH DAILY 30 tablet 6  . labetalol (NORMODYNE) 200 MG tablet TAKE 1 TABLET(200 MG) BY MOUTH TWICE DAILY 60 tablet  5  . omeprazole (PRILOSEC OTC) 20 MG tablet Take 20 mg by mouth daily.     No current facility-administered medications on file prior to visit.     Allergies  Allergen Reactions  . Hydralazine Swelling    Feet swelled so bad they were cracking Severe foot swelling  . Penicillins Other (See Comments)    Per allergy test.  . Amlodipine Swelling  . Amlodipine Besylate Swelling  . Codeine Other (See Comments)    anxiety  . Lipitor [Atorvastatin Calcium] Other (See Comments)    myalgia  . Valsartan Other (See Comments) and Swelling    Caused kidney damage  . Zetia [Ezetimibe] Other (See Comments)    Severe dizziness  . Penicillin G Rash    Family History  Problem Relation Age of Onset  . Heart disease Mother   . Cancer Neg Hx     BP 140/78    Pulse 65   Wt 159 lb 12.8 oz (72.5 kg)   SpO2 98%   BMI 22.93 kg/m    Review of Systems Denies dizziness, otalgia, headache, and visual loss.      Objective:   Physical Exam VS: see vs page GEN: no distress HEAD: head: no deformity eyes: no periorbital swelling, no proptosis.  external nose and ears are normal.  mouth: no lesion seen NECK: supple, thyroid is not enlarged.  Old healed surgical scar (left CEA).  LUNGS: clear to auscultation CV: reg rate and rhythm, no murmur.  MUSCULOSKELETAL: muscle bulk and strength are grossly normal.  no obvious joint swelling.  gait is normal and steady PULSES: no carotid bruit NEURO:  cn 2-12 grossly intact.  readily moves all 4's.   SKIN:  Not diaphoretic PSYCH: alert, well-oriented.  Does not appear anxious nor depressed.   Lab Results  Component Value Date   HGBA1C 10.2 08/06/2017      Assessment & Plan:  Facial sxs, new, uncertain etiology.  poss TIA.   Type 1 DM: therapy limited by variable cbg's.   Patient Instructions  Please see Bonita Quin, to consider a continuous glucose monitor.   Please also see a neurology specialist.  you will receive a phone call, about a day and time for an appointment. Please continue the same aspirin. Please come back for a follow-up appointment in 3 months.

## 2017-08-06 NOTE — Patient Instructions (Addendum)
Please see [name], to consider a continuous glucose monitor.   Please also see a neurology specialist.  you will receive a phone call, about a day and time for an appointment. Please continue the same aspirin. Please come back for a follow-up appointment in 3 months.

## 2017-08-08 ENCOUNTER — Telehealth: Payer: Self-pay | Admitting: Endocrinology

## 2017-08-08 NOTE — Telephone Encounter (Signed)
Patient called in reference to Neurologist referral.Patient stated he can not be seen there until November. Please call patient and advise. OK to leave message.

## 2017-08-08 NOTE — Telephone Encounter (Signed)
Sent a request to [facility] at [facility] to see if we can work the pt in sooner. It is marked as an urgent referral. Called the pt and let him know via VM

## 2017-08-10 ENCOUNTER — Encounter: Payer: Self-pay | Admitting: Neurology

## 2017-08-23 ENCOUNTER — Ambulatory Visit (INDEPENDENT_AMBULATORY_CARE_PROVIDER_SITE_OTHER): Payer: Medicare Other | Admitting: Neurology

## 2017-08-23 ENCOUNTER — Encounter: Payer: Self-pay | Admitting: Neurology

## 2017-08-23 VITALS — BP 142/68 | HR 64 | Ht 70.0 in | Wt 162.9 lb

## 2017-08-23 DIAGNOSIS — R42 Dizziness and giddiness: Secondary | ICD-10-CM

## 2017-08-23 DIAGNOSIS — H53461 Homonymous bilateral field defects, right side: Secondary | ICD-10-CM

## 2017-08-23 DIAGNOSIS — I25119 Atherosclerotic heart disease of native coronary artery with unspecified angina pectoris: Secondary | ICD-10-CM

## 2017-08-23 DIAGNOSIS — R404 Transient alteration of awareness: Secondary | ICD-10-CM

## 2017-08-23 DIAGNOSIS — I10 Essential (primary) hypertension: Secondary | ICD-10-CM

## 2017-08-23 DIAGNOSIS — R2681 Unsteadiness on feet: Secondary | ICD-10-CM | POA: Diagnosis not present

## 2017-08-23 NOTE — Patient Instructions (Addendum)
1.  I want to check an MRI of the brain to rule out any possible stroke.  We will also check MRA of head and neck 2.  I also want to check an EEG to evaluate episode of confusion 3.  Follow up after testing.

## 2017-08-23 NOTE — Progress Notes (Signed)
NEUROLOGY CONSULTATION NOTE  Henry [name]. MRN: [id number] DOB: August 24, 1941  Referring provider: Dr. Everardo All Primary care provider: Dr. Everardo All  Reason for consult:  Dizziness, unsteady gait  HISTORY OF PRESENT ILLNESS: Henry [name] is a 76 year old right-handed male with type 1 diabetes, CAD, hypertension, hyperlipidemia, PAD, diabetic retinopathy and status post carotid endarterectomy who presents for dizziness.  In early August, he woke up one morning and when sitting at the side of the bed, he felt himself being pulled to the left.  When he got up, he had to hold onto furniture to keep his balance.  However, he did not feel dizzy nor experience facial droop, diplopia, dysphagia, tinnitus, headache or unilateral numbness and weakness.  He checked his blood pressure, which was around 130/85.  He checked his blood sugar, which was 134.  He improved throughout the day.  Since then, he has been unsteady on his feet.  He at times stumbles.  When he is walking quickly, he is fine.  However, if he stands and looks up, he develops spinning sensation.  It occurs until he is able to sit down.  He feels fine when he sits down.  He has had vertigo in the past, but that was associated with nausea.  He doesn't feel sick with this.  About 10 years ago, he underwent bilateral stapedectomy.  However, he reports his hearing has gotten worse.  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  08/06/17: Hgb A1c 10.2  Of note, he had an episode of transient confusion in May. He was evaluated in the ED on 05/17/17 for altered mental status.  He has prior history of hypoglycemic episodes, but this was different.  He felt funny and exhibited slurred and nonsensical speech.  He was diaphoretic.  He and his wife thought he may be hypoglycemic, so he drank orange juice, which dripped out of his mouth.  He did not have any lateralizing symptoms such as facial droop or unilateral weakness.  He did not have loss of  consciousness but he was amnestic to the event and didn't become aware until he was in the ambulance.  He presented to the ED for further evaluation.  CT of head was personally reviewed and revealed mild chronic small vessel ischemic changes but no acute abnormalities.  Blood pressure was 217/61.  Blood glucose was 116.  Admission was recommended to evaluate for possible seizure, but patient left AMA.  05/17/17 LABS:  CBC with WBC 8, HGB 11.9, HCT 36.1, PLT 263; CMP with Na 137, K 4.1, Cl 107, CO2 21, glucose 299, BUN 33, Cr 1.65, total bili 0.9, ALP 75, AST 31 and ALT 21; Troponin negative.  PAST MEDICAL HISTORY: Past Medical History:  Diagnosis Date  . ANEMIA-NOS 07/10/2007  . CORONARY ARTERY DISEASE 07/10/2007  . DIABETES MELLITUS, TYPE I 07/10/2007  . HYPERLIPIDEMIA 07/10/2007  . HYPERTENSION 07/10/2007  . Peripheral arterial disease ([facility])   . 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 RENAL INSUFFICIENCY 07/10/2007    PAST SURGICAL HISTORY: Past Surgical History:  Procedure Laterality Date  . APPENDECTOMY    . CARDIAC CATHETERIZATION     stenting in the vein graft to the right coronary artery and had_DES 3x 26 mm Integrity stent inserted, post dilated to 3.4 mm  . CAROTID ENDARTERECTOMY Right    right  . CORONARY ARTERY BYPASS GRAFT  02/2010   PTCA of his right coroary artery.     MEDICATIONS: Current Outpatient Prescriptions on File  Prior to Visit  Medication Sig Dispense Refill  . acetaminophen (TYLENOL) 500 MG tablet Take 500 mg by mouth daily as needed (pain).    Marland Kitchen aspirin EC 81 MG tablet Take 81 mg by mouth daily.    . B Complex-C-Folic Acid (FOLBEE PLUS) TABS Take 1 tablet by mouth daily.      . Cholecalciferol (VITAMIN D) 2000 UNITS CAPS Take 2,000 Units by mouth daily.     Marland Kitchen gabapentin (NEURONTIN) 100 MG capsule Take 100 mg by mouth 2 (two) times daily.     . hydrochlorothiazide (MICROZIDE) 12.5 MG capsule Take 12.5 mg by mouth daily.    . insulin NPH-regular  Human (NOVOLIN 70/30) (70-30) 100 UNIT/ML injection 42 units with breakfast and 25 units with supper 20 mL 11  . isosorbide mononitrate (IMDUR) 60 MG 24 hr tablet TAKE 1 TABLET BY MOUTH DAILY 30 tablet 6  . labetalol (NORMODYNE) 200 MG tablet TAKE 1 TABLET(200 MG) BY MOUTH TWICE DAILY 60 tablet 5  . omeprazole (PRILOSEC OTC) 20 MG tablet Take 20 mg by mouth daily.     No current facility-administered medications on file prior to visit.     ALLERGIES: Allergies  Allergen Reactions  . Hydralazine Swelling    Feet swelled so bad they were cracking Severe foot swelling  . Penicillins Other (See Comments)    Per allergy test.  . Amlodipine Swelling  . Amlodipine Besylate Swelling  . Codeine Other (See Comments)    anxiety  . Lipitor [Atorvastatin Calcium] Other (See Comments)    myalgia  . Valsartan Other (See Comments) and Swelling    Caused kidney damage  . Zetia [Ezetimibe] Other (See Comments)    Severe dizziness  . Penicillin G Rash    FAMILY HISTORY: Family History  Problem Relation Age of Onset  . Heart disease Mother   . Cancer Neg Hx     SOCIAL HISTORY: Social History   Social History  . Marital status: Married    Spouse name: N/A  . Number of children: N/A  . Years of education: BS   Occupational History  . retired     14 yrs ago   Social History Main Topics  . Smoking status: Former Games developer  . Smokeless tobacco: Never Used  . Alcohol use No  . Drug use: No  . Sexual activity: Not on file   Other Topics Concern  . Not on file   Social History Narrative   Says his diet and exercise are very good   Married lives with wife    REVIEW OF SYSTEMS: Constitutional: No fevers, chills, or sweats, no generalized fatigue, change in appetite Eyes: No visual changes, double vision, eye pain Ear, nose and throat: No hearing loss, ear pain, nasal congestion, sore throat Cardiovascular: No chest pain, palpitations Respiratory:  No shortness of breath at rest or  with exertion, wheezes GastrointestinaI: No nausea, vomiting, diarrhea, abdominal pain, fecal incontinence Genitourinary:  No dysuria, urinary retention or frequency Musculoskeletal:  No neck pain, back pain Integumentary: No rash, pruritus, skin lesions Neurological: as above Psychiatric: No depression, insomnia, anxiety Endocrine: No palpitations, fatigue, diaphoresis, mood swings, change in appetite, change in weight, increased thirst Hematologic/Lymphatic:  No purpura, petechiae. Allergic/Immunologic: no itchy/runny eyes, nasal congestion, recent allergic reactions, rashes  PHYSICAL EXAM: Vitals:   08/23/17 0947  BP: (!) 142/68  Pulse: 64  SpO2: 97%   General: No acute distress.  Patient appears well-groomed.  Head:  Normocephalic/atraumatic Eyes:  fundi examined but not  visualized Neck: supple, no paraspinal tenderness, full range of motion Back: No paraspinal tenderness Heart: regular rate and rhythm Lungs: Clear to auscultation bilaterally. Vascular: No carotid bruits. Neurological Exam: Mental status: alert and oriented to person, place, and time, recent and remote memory intact, fund of knowledge intact, attention and concentration intact, speech fluent and not dysarthric, language intact. Cranial nerves: CN I: not tested CN II: pupils equal, round and reactive to light, questionable right homonymous hemianopsia.  CN III, IV, VI:  full range of motion, no nystagmus, no ptosis CN V: facial sensation intact CN VII: upper and lower face symmetric CN VIII: decreased hearing bilaterally CN IX, X: gag intact, uvula midline CN XI: sternocleidomastoid and trapezius muscles intact CN XII: tongue midline Bulk & Tone: normal, no fasciculations. Motor:  5/5 throughout Sensation:  Pinprick sensation intact and vibration sensation reduced in feet. Deep Tendon Reflexes:  2+ throughout, toes downgoing.  Finger to nose testing:  Bilateral intention tremor, left worse than right.  No  dysmetria.  Heel to shin:  Without dysmetria.  Gait:  Normal stride with occasional stumbling.  Briefly unsteady when he stands up.  Able to turn, some unsteadiness with tandem walk. Romberg negative.  IMPRESSION: 1.  Unsteady gait and vertigo.  Due to sudden onset of symptoms, I am most concerned for a posterior circulation stroke.  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  However, testing was inconsistent and he endorsed having poor peripheral vision at baseline.  Also, sudden onset of unsteady gait cannot be explained by neuropathy. 2.  Transient altered awareness in May.  Unclear etiology.  He was not hypoglycemic. 3.  HTN  PLAN: 1.  MRI of brain and MRA of head and neck. 2.  EEG 3.  Further recommendations pending test results. 4.  Will consider PT if gait/dizziness not improved 5.  Follow up after testing. 6.  Follow up blood pressure with PCP  Thank you for allowing me to take part in the care of this patient.  Shon MilletAdam Jaffe, DO  CC:  Romero BellingSean Ellison, MD

## 2017-09-03 ENCOUNTER — Other Ambulatory Visit: Payer: Self-pay

## 2017-09-03 ENCOUNTER — Ambulatory Visit (INDEPENDENT_AMBULATORY_CARE_PROVIDER_SITE_OTHER): Payer: Medicare Other | Admitting: Neurology

## 2017-09-03 ENCOUNTER — Other Ambulatory Visit: Payer: Medicare Other

## 2017-09-03 DIAGNOSIS — R404 Transient alteration of awareness: Secondary | ICD-10-CM | POA: Diagnosis not present

## 2017-09-03 DIAGNOSIS — R413 Other amnesia: Secondary | ICD-10-CM

## 2017-09-03 DIAGNOSIS — I720 Aneurysm of carotid artery: Secondary | ICD-10-CM

## 2017-09-03 NOTE — Procedures (Signed)
ELECTROENCEPHALOGRAM REPORT  Date of Study: 09/03/2017  Patient's Name: Kerby LessGlenn T Chasse Jr. MRN: [id number] Date of Birth: 07/14/1941  Clinical History: 76 year old man with transient altered awareness.  Medications: TYLENOL aspirin EC 81 MG  FOLBEE PLUS  VITAMIN D   NEURONTIN    MICROZIDE NOVOLIN 70/30 IMDUR NORMODYNE PRILOSEC OTC   Technical Summary: A multichannel digital EEG recording measured by the international 10-20 system with electrodes applied with paste and impedances below 5000 ohms performed in our laboratory with EKG monitoring in an awake and asleep patient.  Hyperventilation and photic stimulation were performed.  The digital EEG was referentially recorded, reformatted, and digitally filtered in a variety of bipolar and referential montages for optimal display.    Description: The patient is awake and asleep during the recording.  During maximal wakefulness, there is a symmetric, medium voltage 10 Hz posterior dominant rhythm that attenuates with eye opening.  The record is symmetric.  During drowsiness and sleep, there is an increase in theta slowing of the background.  Vertex waves and symmetric sleep spindles were seen.  Hyperventilation and photic stimulation did not elicit any abnormalities.  There were no epileptiform discharges or electrographic seizures seen.    EKG lead was unremarkable.  Impression: This awake and asleep EEG is normal.    Clinical Correlation: A normal EEG does not exclude a clinical diagnosis of epilepsy.  If further clinical questions remain, prolonged EEG may be helpful.  Clinical correlation is advised.   Shon MilletAdam Shyann Hefner, DO

## 2017-09-04 ENCOUNTER — Telehealth: Payer: Self-pay

## 2017-09-04 NOTE — Telephone Encounter (Signed)
Called and spoke w/Pt, advsd of n ormal EEG results

## 2017-09-04 NOTE — Telephone Encounter (Signed)
-----   Message from Drema DallasAdam R Jaffe, DO sent at 09/03/2017  1:38 PM EDT ----- EEG is normal

## 2017-09-10 ENCOUNTER — Emergency Department (HOSPITAL_COMMUNITY): Payer: Medicare Other

## 2017-09-10 ENCOUNTER — Ambulatory Visit: Payer: Medicare Other | Admitting: Podiatry

## 2017-09-10 ENCOUNTER — Encounter (HOSPITAL_COMMUNITY): Payer: Self-pay | Admitting: Emergency Medicine

## 2017-09-10 ENCOUNTER — Inpatient Hospital Stay (HOSPITAL_COMMUNITY)
Admission: EM | Admit: 2017-09-10 | Discharge: 2017-09-15 | DRG: 418 | Disposition: A | Discharge status: Home / Self Care | Payer: Medicare Other | Attending: Internal Medicine | Admitting: Internal Medicine

## 2017-09-10 DIAGNOSIS — K8020 Calculus of gallbladder without cholecystitis without obstruction: Secondary | ICD-10-CM

## 2017-09-10 DIAGNOSIS — R110 Nausea: Secondary | ICD-10-CM | POA: Diagnosis not present

## 2017-09-10 DIAGNOSIS — E1022 Type 1 diabetes mellitus with diabetic chronic kidney disease: Secondary | ICD-10-CM | POA: Diagnosis present

## 2017-09-10 DIAGNOSIS — R1084 Generalized abdominal pain: Secondary | ICD-10-CM

## 2017-09-10 DIAGNOSIS — I10 Essential (primary) hypertension: Secondary | ICD-10-CM | POA: Diagnosis not present

## 2017-09-10 DIAGNOSIS — K3184 Gastroparesis: Secondary | ICD-10-CM | POA: Diagnosis not present

## 2017-09-10 DIAGNOSIS — Z885 Allergy status to narcotic agent status: Secondary | ICD-10-CM | POA: Diagnosis not present

## 2017-09-10 DIAGNOSIS — Z888 Allergy status to other drugs, medicaments and biological substances status: Secondary | ICD-10-CM | POA: Diagnosis not present

## 2017-09-10 DIAGNOSIS — K8012 Calculus of gallbladder with acute and chronic cholecystitis without obstruction: Principal | ICD-10-CM | POA: Diagnosis present

## 2017-09-10 DIAGNOSIS — J90 Pleural effusion, not elsewhere classified: Secondary | ICD-10-CM | POA: Diagnosis not present

## 2017-09-10 DIAGNOSIS — E1051 Type 1 diabetes mellitus with diabetic peripheral angiopathy without gangrene: Secondary | ICD-10-CM | POA: Diagnosis present

## 2017-09-10 DIAGNOSIS — Z880 Allergy status to penicillin: Secondary | ICD-10-CM

## 2017-09-10 DIAGNOSIS — Z794 Long term (current) use of insulin: Secondary | ICD-10-CM

## 2017-09-10 DIAGNOSIS — Z7982 Long term (current) use of aspirin: Secondary | ICD-10-CM

## 2017-09-10 DIAGNOSIS — H9190 Unspecified hearing loss, unspecified ear: Secondary | ICD-10-CM | POA: Diagnosis present

## 2017-09-10 DIAGNOSIS — Z9842 Cataract extraction status, left eye: Secondary | ICD-10-CM | POA: Diagnosis not present

## 2017-09-10 DIAGNOSIS — E1043 Type 1 diabetes mellitus with diabetic autonomic (poly)neuropathy: Secondary | ICD-10-CM | POA: Diagnosis present

## 2017-09-10 DIAGNOSIS — E1143 Type 2 diabetes mellitus with diabetic autonomic (poly)neuropathy: Secondary | ICD-10-CM

## 2017-09-10 DIAGNOSIS — I700 Atherosclerosis of aorta: Secondary | ICD-10-CM | POA: Diagnosis present

## 2017-09-10 DIAGNOSIS — N183 Chronic kidney disease, stage 3 (moderate): Secondary | ICD-10-CM | POA: Diagnosis present

## 2017-09-10 DIAGNOSIS — E103599 Type 1 diabetes mellitus with proliferative diabetic retinopathy without macular edema, unspecified eye: Secondary | ICD-10-CM | POA: Diagnosis present

## 2017-09-10 DIAGNOSIS — I2510 Atherosclerotic heart disease of native coronary artery without angina pectoris: Secondary | ICD-10-CM | POA: Diagnosis present

## 2017-09-10 DIAGNOSIS — Z79899 Other long term (current) drug therapy: Secondary | ICD-10-CM

## 2017-09-10 DIAGNOSIS — R112 Nausea with vomiting, unspecified: Secondary | ICD-10-CM

## 2017-09-10 DIAGNOSIS — K429 Umbilical hernia without obstruction or gangrene: Secondary | ICD-10-CM | POA: Diagnosis present

## 2017-09-10 DIAGNOSIS — R932 Abnormal findings on diagnostic imaging of liver and biliary tract: Secondary | ICD-10-CM

## 2017-09-10 DIAGNOSIS — K5900 Constipation, unspecified: Secondary | ICD-10-CM | POA: Diagnosis not present

## 2017-09-10 DIAGNOSIS — I129 Hypertensive chronic kidney disease with stage 1 through stage 4 chronic kidney disease, or unspecified chronic kidney disease: Secondary | ICD-10-CM | POA: Diagnosis present

## 2017-09-10 DIAGNOSIS — Z87891 Personal history of nicotine dependence: Secondary | ICD-10-CM | POA: Diagnosis not present

## 2017-09-10 DIAGNOSIS — R269 Unspecified abnormalities of gait and mobility: Secondary | ICD-10-CM | POA: Diagnosis present

## 2017-09-10 DIAGNOSIS — R0602 Shortness of breath: Secondary | ICD-10-CM | POA: Diagnosis not present

## 2017-09-10 DIAGNOSIS — R109 Unspecified abdominal pain: Secondary | ICD-10-CM | POA: Diagnosis not present

## 2017-09-10 DIAGNOSIS — R1011 Right upper quadrant pain: Secondary | ICD-10-CM | POA: Diagnosis not present

## 2017-09-10 DIAGNOSIS — R531 Weakness: Secondary | ICD-10-CM | POA: Diagnosis present

## 2017-09-10 DIAGNOSIS — K811 Chronic cholecystitis: Secondary | ICD-10-CM | POA: Diagnosis not present

## 2017-09-10 DIAGNOSIS — K8000 Calculus of gallbladder with acute cholecystitis without obstruction: Secondary | ICD-10-CM | POA: Diagnosis not present

## 2017-09-10 DIAGNOSIS — Z955 Presence of coronary angioplasty implant and graft: Secondary | ICD-10-CM

## 2017-09-10 DIAGNOSIS — Z9841 Cataract extraction status, right eye: Secondary | ICD-10-CM | POA: Diagnosis not present

## 2017-09-10 DIAGNOSIS — K5909 Other constipation: Secondary | ICD-10-CM | POA: Diagnosis not present

## 2017-09-10 DIAGNOSIS — E785 Hyperlipidemia, unspecified: Secondary | ICD-10-CM | POA: Diagnosis present

## 2017-09-10 DIAGNOSIS — D649 Anemia, unspecified: Secondary | ICD-10-CM | POA: Diagnosis not present

## 2017-09-10 DIAGNOSIS — K8689 Other specified diseases of pancreas: Secondary | ICD-10-CM | POA: Diagnosis present

## 2017-09-10 DIAGNOSIS — Z8249 Family history of ischemic heart disease and other diseases of the circulatory system: Secondary | ICD-10-CM

## 2017-09-10 DIAGNOSIS — Z951 Presence of aortocoronary bypass graft: Secondary | ICD-10-CM

## 2017-09-10 DIAGNOSIS — R0902 Hypoxemia: Secondary | ICD-10-CM | POA: Diagnosis not present

## 2017-09-10 DIAGNOSIS — K8010 Calculus of gallbladder with chronic cholecystitis without obstruction: Secondary | ICD-10-CM | POA: Diagnosis not present

## 2017-09-10 DIAGNOSIS — R933 Abnormal findings on diagnostic imaging of other parts of digestive tract: Secondary | ICD-10-CM

## 2017-09-10 LAB — URINALYSIS, ROUTINE W REFLEX MICROSCOPIC
Bacteria, UA: NONE SEEN
Bilirubin Urine: NEGATIVE
GLUCOSE, UA: NEGATIVE mg/dL
HGB URINE DIPSTICK: NEGATIVE
KETONES UR: 5 mg/dL — AB
LEUKOCYTES UA: NEGATIVE
Nitrite: NEGATIVE
PH: 5 (ref 5.0–8.0)
PROTEIN: 100 mg/dL — AB
SQUAMOUS EPITHELIAL / LPF: NONE SEEN
Specific Gravity, Urine: 1.027 (ref 1.005–1.030)

## 2017-09-10 LAB — COMPREHENSIVE METABOLIC PANEL
ALT: 19 U/L (ref 17–63)
AST: 24 U/L (ref 15–41)
Albumin: 3.7 g/dL (ref 3.5–5.0)
Alkaline Phosphatase: 80 U/L (ref 38–126)
Anion gap: 10 (ref 5–15)
BUN: 34 mg/dL — AB (ref 6–20)
CALCIUM: 9.4 mg/dL (ref 8.9–10.3)
CHLORIDE: 106 mmol/L (ref 101–111)
CO2: 22 mmol/L (ref 22–32)
CREATININE: 1.51 mg/dL — AB (ref 0.61–1.24)
GFR, EST AFRICAN AMERICAN: 50 mL/min — AB (ref >60)
GFR, EST NON AFRICAN AMERICAN: 43 mL/min — AB (ref >60)
Glucose, Bld: 94 mg/dL (ref 65–99)
Potassium: 3.7 mmol/L (ref 3.5–5.1)
Sodium: 138 mmol/L (ref 135–145)
TOTAL PROTEIN: 7.3 g/dL (ref 6.5–8.1)
Total Bilirubin: 0.8 mg/dL (ref 0.3–1.2)

## 2017-09-10 LAB — LIPASE, BLOOD: LIPASE: 19 U/L (ref 11–51)

## 2017-09-10 LAB — I-STAT TROPONIN, ED: TROPONIN I, POC: 0 ng/mL (ref 0.00–0.08)

## 2017-09-10 LAB — GLUCOSE, CAPILLARY
GLUCOSE-CAPILLARY: 300 mg/dL — AB (ref 65–99)
GLUCOSE-CAPILLARY: 208 mg/dL — AB (ref 65–99)

## 2017-09-10 LAB — CBC
HCT: 32.2 % — ABNORMAL LOW (ref 39.0–52.0)
Hemoglobin: 11.1 g/dL — ABNORMAL LOW (ref 13.0–17.0)
MCH: 29.9 pg (ref 26.0–34.0)
MCHC: 34.5 g/dL (ref 30.0–36.0)
MCV: 86.8 fL (ref 78.0–100.0)
PLATELETS: 267 10*3/uL (ref 150–400)
RBC: 3.71 MIL/uL — AB (ref 4.22–5.81)
RDW: 13.4 % (ref 11.5–15.5)
WBC: 12.3 10*3/uL — AB (ref 4.0–10.5)

## 2017-09-10 LAB — I-STAT BETA HCG BLOOD, ED (MC, WL, AP ONLY)

## 2017-09-10 LAB — CBG MONITORING, ED
Glucose-Capillary: 141 mg/dL — ABNORMAL HIGH (ref 65–99)
Glucose-Capillary: 272 mg/dL — ABNORMAL HIGH (ref 65–99)

## 2017-09-10 LAB — I-STAT CHEM 8, ED
BUN: 33 mg/dL — ABNORMAL HIGH (ref 6–20)
CREATININE: 1.6 mg/dL — AB (ref 0.61–1.24)
Calcium, Ion: 1.13 mmol/L — ABNORMAL LOW (ref 1.15–1.40)
Chloride: 107 mmol/L (ref 101–111)
Glucose, Bld: 89 mg/dL (ref 65–99)
HCT: 33 % — ABNORMAL LOW (ref 39.0–52.0)
HEMOGLOBIN: 11.2 g/dL — AB (ref 13.0–17.0)
POTASSIUM: 3.7 mmol/L (ref 3.5–5.1)
Sodium: 142 mmol/L (ref 135–145)
TCO2: 23 mmol/L (ref 22–32)

## 2017-09-10 LAB — I-STAT CG4 LACTIC ACID, ED: LACTIC ACID, VENOUS: 1.45 mmol/L (ref 0.5–1.9)

## 2017-09-10 LAB — D-DIMER, QUANTITATIVE (NOT AT ARMC): D DIMER QUANT: 1.02 ug{FEU}/mL — AB (ref 0.00–0.50)

## 2017-09-10 MED ORDER — INSULIN ASPART 100 UNIT/ML ~~LOC~~ SOLN
0.0000 [IU] | Freq: Four times a day (QID) | SUBCUTANEOUS | Status: DC
  Administered 2017-09-10 (×2): 5 [IU] via SUBCUTANEOUS
  Administered 2017-09-11: 2 [IU] via SUBCUTANEOUS
  Administered 2017-09-11 – 2017-09-12 (×2): 9 [IU] via SUBCUTANEOUS
  Administered 2017-09-12: 7 [IU] via SUBCUTANEOUS
  Filled 2017-09-10: qty 1

## 2017-09-10 MED ORDER — ASPIRIN EC 81 MG PO TBEC
81.0000 mg | DELAYED_RELEASE_TABLET | Freq: Every day | ORAL | Status: DC
  Administered 2017-09-13 – 2017-09-15 (×3): 81 mg via ORAL
  Filled 2017-09-10 (×7): qty 1

## 2017-09-10 MED ORDER — METOCLOPRAMIDE HCL 5 MG/ML IJ SOLN
10.0000 mg | Freq: Once | INTRAMUSCULAR | Status: AC
  Administered 2017-09-10: 10 mg via INTRAVENOUS
  Filled 2017-09-10: qty 2

## 2017-09-10 MED ORDER — PANTOPRAZOLE SODIUM 40 MG IV SOLR
40.0000 mg | INTRAVENOUS | Status: DC
  Administered 2017-09-10 – 2017-09-13 (×4): 40 mg via INTRAVENOUS
  Filled 2017-09-10 (×4): qty 40

## 2017-09-10 MED ORDER — ISOSORBIDE MONONITRATE ER 60 MG PO TB24
60.0000 mg | ORAL_TABLET | Freq: Every day | ORAL | Status: DC
  Administered 2017-09-10 – 2017-09-15 (×6): 60 mg via ORAL
  Filled 2017-09-10 (×7): qty 1

## 2017-09-10 MED ORDER — HEPARIN SODIUM (PORCINE) 5000 UNIT/ML IJ SOLN
5000.0000 [IU] | Freq: Three times a day (TID) | INTRAMUSCULAR | Status: DC
  Filled 2017-09-10 (×7): qty 1

## 2017-09-10 MED ORDER — FENTANYL CITRATE (PF) 100 MCG/2ML IJ SOLN
25.0000 ug | Freq: Once | INTRAMUSCULAR | Status: AC
  Administered 2017-09-10: 25 ug via INTRAVENOUS
  Filled 2017-09-10: qty 2

## 2017-09-10 MED ORDER — FENTANYL CITRATE (PF) 100 MCG/2ML IJ SOLN
12.5000 ug | Freq: Once | INTRAMUSCULAR | Status: AC
  Administered 2017-09-10: 12.5 ug via INTRAVENOUS
  Filled 2017-09-10: qty 2

## 2017-09-10 MED ORDER — HYDROCHLOROTHIAZIDE 12.5 MG PO CAPS
12.5000 mg | ORAL_CAPSULE | Freq: Every day | ORAL | Status: DC
  Administered 2017-09-10: 12.5 mg via ORAL
  Filled 2017-09-10: qty 1

## 2017-09-10 MED ORDER — IOPAMIDOL (ISOVUE-370) INJECTION 76%
100.0000 mL | Freq: Once | INTRAVENOUS | Status: AC | PRN
  Administered 2017-09-10: 75 mL via INTRAVENOUS

## 2017-09-10 MED ORDER — ONDANSETRON HCL 4 MG/2ML IJ SOLN: 4.0000 mg | Freq: Four times a day (QID) | INTRAMUSCULAR | Status: DC | PRN

## 2017-09-10 MED ORDER — MORPHINE SULFATE (PF) 2 MG/ML IV SOLN: 2.0000 mg | INTRAVENOUS | Status: DC | PRN

## 2017-09-10 MED ORDER — ONDANSETRON HCL 4 MG/2ML IJ SOLN
4.0000 mg | Freq: Once | INTRAMUSCULAR | Status: AC | PRN
  Administered 2017-09-10: 4 mg via INTRAVENOUS
  Filled 2017-09-10: qty 2

## 2017-09-10 MED ORDER — LABETALOL HCL 200 MG PO TABS: 200.0000 mg | ORAL_TABLET | Freq: Two times a day (BID) | ORAL | Status: DC

## 2017-09-10 MED ORDER — GABAPENTIN 100 MG PO CAPS
100.0000 mg | ORAL_CAPSULE | Freq: Two times a day (BID) | ORAL | Status: DC
  Administered 2017-09-10 – 2017-09-15 (×9): 100 mg via ORAL
  Filled 2017-09-10 (×11): qty 1

## 2017-09-10 MED ORDER — MORPHINE SULFATE (PF) 4 MG/ML IV SOLN
2.0000 mg | INTRAVENOUS | Status: DC | PRN
  Administered 2017-09-10 – 2017-09-12 (×4): 2 mg via INTRAVENOUS
  Filled 2017-09-10 (×4): qty 1

## 2017-09-10 MED ORDER — IOPAMIDOL (ISOVUE-370) INJECTION 76%
INTRAVENOUS | Status: AC
  Administered 2017-09-10: 75 mL via INTRAVENOUS
  Filled 2017-09-10: qty 100

## 2017-09-10 MED ORDER — SODIUM CHLORIDE 0.9 % IV BOLUS (SEPSIS)
1000.0000 mL | Freq: Once | INTRAVENOUS | Status: AC
  Administered 2017-09-10: 1000 mL via INTRAVENOUS

## 2017-09-10 MED ORDER — INSULIN ASPART PROT & ASPART (70-30 MIX) 100 UNIT/ML ~~LOC~~ SUSP
15.0000 [IU] | Freq: Two times a day (BID) | SUBCUTANEOUS | Status: DC
  Administered 2017-09-10: 15 [IU] via SUBCUTANEOUS
  Filled 2017-09-10: qty 10

## 2017-09-10 MED ORDER — LABETALOL HCL 200 MG PO TABS
200.0000 mg | ORAL_TABLET | Freq: Two times a day (BID) | ORAL | Status: DC
  Administered 2017-09-10 – 2017-09-15 (×10): 200 mg via ORAL
  Filled 2017-09-10 (×11): qty 1

## 2017-09-10 MED ORDER — SODIUM CHLORIDE 0.9 % IV SOLN
INTRAVENOUS | Status: DC
  Administered 2017-09-10 – 2017-09-12 (×4): via INTRAVENOUS

## 2017-09-10 MED ORDER — FENTANYL CITRATE (PF) 100 MCG/2ML IJ SOLN
50.0000 ug | INTRAMUSCULAR | Status: AC | PRN
  Administered 2017-09-10 (×2): 50 ug via INTRAVENOUS
  Filled 2017-09-10 (×2): qty 2

## 2017-09-10 MED ORDER — INSULIN ASPART 100 UNIT/ML ~~LOC~~ SOLN: 0.0000 [IU] | Freq: Three times a day (TID) | SUBCUTANEOUS | Status: DC

## 2017-09-10 NOTE — Consult Note (Signed)
Reason for Consult: gallstones and liver mass Referring Physician: Domingo Sep T Treshawn Allen. is an 76 y.o. male.  HPI: 76 yo male with intermittent diffuse abdominal pain. Pain is not related to meals. It is associated with nausea. He has no bowel habit changes. He denies blood per rectum. Pain does not radiate.  Past Medical History:  Diagnosis Date  . ANEMIA-NOS 07/10/2007  . CORONARY ARTERY DISEASE 07/10/2007  . DIABETES MELLITUS, TYPE I 07/10/2007  . HYPERLIPIDEMIA 07/10/2007  . HYPERTENSION 07/10/2007  . Peripheral arterial disease ([place])   . 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 RENAL INSUFFICIENCY 07/10/2007    Past Surgical History:  Procedure Laterality Date  . APPENDECTOMY    . CARDIAC CATHETERIZATION     stenting in the vein graft to the right coronary artery and had_DES 3x 26 mm Integrity stent inserted, post dilated to 3.4 mm  . CAROTID ENDARTERECTOMY Right    right  . CORONARY ARTERY BYPASS GRAFT  02/2010   PTCA of his right coroary artery.     Family History  Problem Relation Age of Onset  . Heart disease Mother   . Cancer Neg Hx     Social History:  reports that he has quit smoking. He has never used smokeless tobacco. He reports that he does not drink alcohol or use drugs.  Allergies:  Allergies  Allergen Reactions  . Hydralazine Swelling    Feet swelled so bad they were cracking Severe foot swelling  . Penicillins Other (See Comments)    Per allergy test.  . Amlodipine Swelling  . Amlodipine Besylate Swelling  . Codeine Other (See Comments)    anxiety  . Lipitor [Atorvastatin Calcium] Other (See Comments)    myalgia  . Valsartan Other (See Comments) and Swelling    Caused kidney damage  . Zetia [Ezetimibe] Other (See Comments)    Severe dizziness  . Penicillin G Rash    Medications: I have reviewed the patient's current medications.  Results for orders placed or performed during the hospital encounter of 09/10/17 (from the past 48  hour(s))  Lipase, blood     Status: None   Collection Time: 09/10/17  4:20 AM  Result Value Ref Range   Lipase 19 11 - 51 U/L  Comprehensive metabolic panel     Status: Abnormal   Collection Time: 09/10/17  4:20 AM  Result Value Ref Range   Sodium 138 135 - 145 mmol/L   Potassium 3.7 3.5 - 5.1 mmol/L   Chloride 106 101 - 111 mmol/L   CO2 22 22 - 32 mmol/L   Glucose, Bld 94 65 - 99 mg/dL   BUN 34 (H) 6 - 20 mg/dL   Creatinine, Ser 1.51 (H) 0.61 - 1.24 mg/dL   Calcium 9.4 8.9 - 10.3 mg/dL   Total Protein 7.3 6.5 - 8.1 g/dL   Albumin 3.7 3.5 - 5.0 g/dL   AST 24 15 - 41 U/L   ALT 19 17 - 63 U/L   Alkaline Phosphatase 80 38 - 126 U/L   Total Bilirubin 0.8 0.3 - 1.2 mg/dL   GFR calc non Af Amer 43 (L) >60 mL/min   GFR calc Af Amer 50 (L) >60 mL/min    Comment: (NOTE) The eGFR has been calculated using the CKD EPI equation. This calculation has not been validated in all clinical situations. eGFR's persistently <60 mL/min signify possible Chronic Kidney Disease.    Anion gap 10 5 - 15  CBC  Status: Abnormal   Collection Time: 09/10/17  4:20 AM  Result Value Ref Range   WBC 12.3 (H) 4.0 - 10.5 K/uL   RBC 3.71 (L) 4.22 - 5.81 MIL/uL   Hemoglobin 11.1 (L) 13.0 - 17.0 g/dL   HCT 32.2 (L) 39.0 - 52.0 %   MCV 86.8 78.0 - 100.0 fL   MCH 29.9 26.0 - 34.0 pg   MCHC 34.5 30.0 - 36.0 g/dL   RDW 13.4 11.5 - 15.5 %   Platelets 267 150 - 400 K/uL  D-dimer, quantitative (not at [facility])     Status: Abnormal   Collection Time: 09/10/17  4:20 AM  Result Value Ref Range   D-Dimer, Quant 1.02 (H) 0.00 - 0.50 ug/mL-FEU    Comment: (NOTE) At the manufacturer cut-off of 0.50 ug/mL FEU, this assay has been documented to exclude PE with a sensitivity and negative predictive value of 97 to 99%.  At this time, this assay has not been approved by the FDA to exclude DVT/VTE. Results should be correlated with clinical presentation.   I-stat Chem 8, ED     Status: Abnormal   Collection Time:  09/10/17  4:33 AM  Result Value Ref Range   Sodium 142 135 - 145 mmol/L   Potassium 3.7 3.5 - 5.1 mmol/L   Chloride 107 101 - 111 mmol/L   BUN 33 (H) 6 - 20 mg/dL   Creatinine, Ser 1.60 (H) 0.61 - 1.24 mg/dL   Glucose, Bld 89 65 - 99 mg/dL   Calcium, Ion 1.13 (L) 1.15 - 1.40 mmol/L   TCO2 23 22 - 32 mmol/L   Hemoglobin 11.2 (L) 13.0 - 17.0 g/dL   HCT 33.0 (L) 39.0 - 52.0 %  I-Stat beta hCG blood, ED ([facility], [facility], [facility] only)     Status: None   Collection Time: 09/10/17  6:50 AM  Result Value Ref Range   I-stat hCG, quantitative <5.0 <5 mIU/mL   Comment 3            Comment:   GEST. AGE      CONC.  (mIU/mL)   <=1 WEEK        5 - 50     2 WEEKS       50 - 500     3 WEEKS       100 - 10,000     4 WEEKS     1,000 - 30,000        MALE AND NON-PREGNANT MALE:     LESS THAN 5 mIU/mL   I-stat troponin, ED     Status: None   Collection Time: 09/10/17  6:51 AM  Result Value Ref Range   Troponin i, poc 0.00 0.00 - 0.08 ng/mL   Comment 3            Comment: Due to the release kinetics of cTnI, a negative result within the first hours of the onset of symptoms does not rule out myocardial infarction with certainty. If myocardial infarction is still suspected, repeat the test at appropriate intervals.   POC CBG, ED     Status: Abnormal   Collection Time: 09/10/17  6:57 AM  Result Value Ref Range   Glucose-Capillary 141 (H) 65 - 99 mg/dL  I-Stat CG4 Lactic Acid, ED     Status: None   Collection Time: 09/10/17  7:09 AM  Result Value Ref Range   Lactic Acid, Venous 1.45 0.5 - 1.9 mmol/L  Urinalysis, Routine w reflex microscopic  Status: Abnormal   Collection Time: 09/10/17  7:40 AM  Result Value Ref Range   Color, Urine YELLOW YELLOW   APPearance CLEAR CLEAR   Specific Gravity, Urine 1.027 1.005 - 1.030   pH 5.0 5.0 - 8.0   Glucose, UA NEGATIVE NEGATIVE mg/dL   Hgb urine dipstick NEGATIVE NEGATIVE   Bilirubin Urine NEGATIVE NEGATIVE   Ketones, ur 5 (A) NEGATIVE mg/dL   Protein, ur  100 (A) NEGATIVE mg/dL   Nitrite NEGATIVE NEGATIVE   Leukocytes, UA NEGATIVE NEGATIVE   RBC / HPF 0-5 0 - 5 RBC/hpf   WBC, UA 0-5 0 - 5 WBC/hpf   Bacteria, UA NONE SEEN NONE SEEN   Squamous Epithelial / LPF NONE SEEN NONE SEEN    Ct Angio Chest/abd/pel For Dissection W And/or W/wo  Result Date: 09/10/2017 CLINICAL DATA:  Patient woke up with severe abdominal pain and nausea. History of renal insufficiency, hypertension, diabetes. EXAM: CT ANGIOGRAPHY CHEST, ABDOMEN AND PELVIS TECHNIQUE: Multidetector CT imaging through the chest, abdomen and pelvis was performed using the standard protocol during bolus administration of intravenous contrast. Multiplanar reconstructed images and MIPs were obtained and reviewed to evaluate the vascular anatomy. CONTRAST:  75 mL Isovue 350 COMPARISON:  None. FINDINGS: CTA CHEST FINDINGS Cardiovascular: Noncontrast images of the chest demonstrate postoperative changes consistent with previous sternotomy and coronary bypass grafts. Aortic calcification. No evidence of intramural hematoma. Calcified granulomas in the spleen. Images obtained during arterial phase after intravenous contrast material was administered demonstrate normal caliber patent thoracic aorta and great vessel origins. No evidence of aortic aneurysm or dissection. Normal heart size. No pericardial effusion. Mediastinum/Nodes: No enlarged mediastinal, hilar, or axillary lymph nodes. Thyroid gland, trachea, and esophagus demonstrate no significant findings. Lungs/Pleura: Evaluation is limited due to motion artifact. Dependent atelectasis demonstrated in both lung bases. No pleural effusions. No pneumothorax. Airways are patent. Musculoskeletal: No chest wall abnormality. No acute or significant osseous findings. Review of the MIP images confirms the above findings. CTA ABDOMEN AND PELVIS FINDINGS VASCULAR Aorta: Normal caliber abdominal aorta. Diffuse calcification throughout the abdominal aorta. No aneurysm or  dissection. Celiac: Celiac axis is patent with calcification demonstrated at the origin of the celiac trunk. Can't exclude some degree of stenosis. SMA: Superior mesenteric artery is patent with calcification at the origin and proximal and midportion. Can't exclude some degree of calcification. Renals: Single renal arteries bilaterally are patent. Scattered calcification throughout the renal arteries. IMA: Inferior mesenteric artery is patent with dense calcification at the origin, likely resulting in stenosis. Inflow: Dense calcification of vessels is demonstrated although the vessels remain patent. Veins: No obvious venous abnormality within the limitations of this arterial phase study. Review of the MIP images confirms the above findings. NON-VASCULAR Hepatobiliary: Large area arterial phase enhancement in the junction of segments 4 and 5 of the liver measuring 6.2 cm diameter. This could represent an atypical hemangioma or more likely transient hepatic attenuation difference. Consider ultrasound for further evaluation to exclude a focal mass lesion. Cholelithiasis with several stones in the gallbladder. No gallbladder wall thickening or edema. No bile duct dilatation. Pancreas: Pancreas is atrophic. Spleen: Normal in size without focal abnormality. Adrenals/Urinary Tract: Adrenal glands are unremarkable. Kidneys are normal, without renal calculi, focal lesion, or hydronephrosis. Bladder is unremarkable. Stomach/Bowel: Stomach, small bowel, and colon are not abnormally distended. Stool fills the colon. No inflammatory changes. Appendix is surgically absent. Lymphatic: No significant lymphadenopathy. Reproductive: Prostate gland is enlarged, measuring 5.3 cm diameter. Other: No free air or free  fluid in the abdomen. Periumbilical hernia containing fat. Musculoskeletal: No acute or significant osseous findings. Review of the MIP images confirms the above findings. IMPRESSION: 1. No evidence of aneurysm or  dissection of the thoracic or abdominal aorta. 2. Extensive aortic and major vessel calcifications. Visualized vessels appear patent but areas of nonocclusive stenosis are not excluded. Aortic atherosclerosis. 3. Cholelithiasis without evidence of cholecystitis. 4. Area of arterial phase enhancement in segments 4-5 of the liver likely represent transient hepatic attenuation difference. Consider ultrasound for further evaluation to exclude a focal mass lesion. 5. Prostate gland is enlarged. Electronically Signed   By: Lucienne Capers M.D.   On: 09/10/2017 06:13    Review of Systems  Constitutional: Negative for chills and fever.  HENT: Negative for hearing loss.   Eyes: Negative for blurred vision and double vision.  Respiratory: Negative for cough and hemoptysis.   Cardiovascular: Negative for chest pain and palpitations.  Gastrointestinal: Positive for abdominal pain, nausea and vomiting. Negative for blood in stool and constipation.  Genitourinary: Negative for dysuria and urgency.  Musculoskeletal: Negative for myalgias and neck pain.  Skin: Negative for itching and rash.  Neurological: Negative for dizziness, tingling and headaches.  Endo/Heme/Allergies: Does not bruise/bleed easily.  Psychiatric/Behavioral: Negative for depression and suicidal ideas.   Blood pressure (!) 211/67, pulse 82, temperature 97.8 F (36.6 C), temperature source Oral, resp. rate 16, height _0  (1.778 m), weight 74.8 kg (165 lb), SpO2 90 %. Physical Exam  Vitals reviewed. Constitutional: He is oriented to person, place, and time. He appears well-developed and well-nourished.  HENT:  Head: Normocephalic and atraumatic.  Eyes: Pupils are equal, round, and reactive to light. Conjunctivae and EOM are normal.  Neck: Normal range of motion. Neck supple.  Cardiovascular: Normal rate and regular rhythm.   Respiratory: Effort normal and breath sounds normal.  GI: Soft. Bowel sounds are normal. He exhibits no  distension. There is generalized tenderness. There is no rigidity and no guarding.  Musculoskeletal: Normal range of motion.  Neurological: He is alert and oriented to person, place, and time.  Skin: Skin is warm and dry.  Psychiatric: He has a normal mood and affect. His behavior is normal.    Assessment/Plan: 76 yo male with diffuse abdominal pain not directly related to food. No bowel changes, stones seen without inflammatory changes and gallbladder abutting contrast filling liver mass consistent with hemangioma -f/u US -may warrant MRI with contrast to further delineate mass -due to location of presumed hemangioma cholecystectomy would be technically difficult and not recommended without clear evidence of pathology.  Arta Bruce Kinsinger 09/10/2017, 10:12 AM   Addendum Ultrasound shows no definitive mass but does show 1cm gallstone and now wall thickening or CBD dilation. Will review images with radiology

## 2017-09-10 NOTE — ED Triage Notes (Signed)
Brought in by EMS from home with c/o abdominal pain with nausea, no vomiting or diarrhea.  Pt reported that he woke up to the severe pain this morning.

## 2017-09-10 NOTE — ED Provider Notes (Signed)
[facility]-EMERGENCY DEPT Provider Note   CSN: [id number] Arrival date & time: 09/10/17  0403     History   Chief Complaint Chief Complaint  Patient presents with  . Abdominal Pain    HPI Henry [name]. is a 76 y.o. male.  HPI 76 year old Caucasian male past medical history significant for CAD, diabetes, hypertension, hyperlipidemia, PAD presents to the emergency Department today from home by EMS with severe abdominal pain and nausea. The patient states that he woke up at approximately 3 AM this morning with this severe onset abdominal pain that has been constant. Sharp in nature. Nothing makes better or worse. He has not taken anything for the pain prior to arrival. Denies any associated fever, emesis, diarrhea, melena, hematochezia, urinary symptoms. No history of same. Patient states that the pain does not radiate. . Pt denies any fever, chill, ha, vision changes, lightheadedness, dizziness, congestion, neck pain, cp, sob, cough, v/d, urinary symptoms, change in bowel habits, melena, hematochezia, lower extremity paresthesias.  Past Medical History:  Diagnosis Date  . ANEMIA-NOS 07/10/2007  . CORONARY ARTERY DISEASE 07/10/2007  . DIABETES MELLITUS, TYPE I 07/10/2007  . HYPERLIPIDEMIA 07/10/2007  . HYPERTENSION 07/10/2007  . Peripheral arterial disease (HCC)   . 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 RENAL INSUFFICIENCY 07/10/2007    Patient Active Problem List   Diagnosis Date Noted  . Facial problem 08/06/2017  . CKD (chronic kidney disease), stage III 05/17/2017  . Transient alteration of awareness 05/17/2017  . Hypoglycemia due to insulin 05/17/2017  . Peripheral arterial disease (HCC) 11/04/2014  . Claudication (HCC) 09/24/2014  . Premature ventricular contractions (PVCs) (VPCs) 04/06/2014  . NSVT (nonsustained ventricular tachycardia) (HCC) 04/06/2014  . Diabetic peripheral neuropathy (HCC) 02/04/2014  . Dizziness 07/10/2013  . Diabetes (HCC) 06/13/2013    . 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 Screening for prostate cancer 05/12/2011  . Pure hypercholesterolemia 05/12/2011  . Encounter for long-term (current) use of other medications 05/12/2011  . 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 PROLIFERATIVE DIABETIC RETINOPATHY 05/05/2010  . Dyslipidemia 07/10/2007  . Anemia 07/10/2007  . Essential hypertension 07/10/2007  . Coronary atherosclerosis 07/10/2007  . Disorder resulting from impaired renal function 07/10/2007    Past Surgical History:  Procedure Laterality Date  . APPENDECTOMY    . CARDIAC CATHETERIZATION     stenting in the vein graft to the right coronary artery and had_DES 3x 26 mm Integrity stent inserted, post dilated to 3.4 mm  . CAROTID ENDARTERECTOMY Right    right  . CORONARY ARTERY BYPASS GRAFT  02/2010   PTCA of his right coroary artery.        Home Medications    Prior to Admission medications   Medication Sig Start Date End Date Taking? Authorizing Provider  acetaminophen (TYLENOL) 500 MG tablet Take 500 mg by mouth daily as needed (pain).   Yes [provider]  aspirin EC 81 MG tablet Take 81 mg by mouth daily.   Yes [provider]  B Complex-C-Folic Acid (FOLBEE PLUS) TABS Take 1 tablet by mouth daily.     Yes [provider]  Cholecalciferol (VITAMIN D) 2000 UNITS CAPS Take 2,000 Units by mouth daily.    Yes [provider]  gabapentin (NEURONTIN) 100 MG capsule Take 100 mg by mouth 2 (two) times daily.    Yes [provider]  hydrochlorothiazide (MICROZIDE) 12.5 MG capsule Take 12.5 mg by mouth daily.   Yes [provider]  insulin NPH-regular Human (NOVOLIN 70/30) (70-30) 100 UNIT/ML injection 42 units with breakfast and 25 units with supper 10/06/16  Yes Romero Belling, MD  isosorbide mononitrate (IMDUR) 60 MG 24 hr tablet TAKE 1 TABLET BY MOUTH DAILY 04/23/17  Yes Lennette Bihari, MD  labetalol  (NORMODYNE) 200 MG tablet TAKE 1 TABLET(200 MG) BY MOUTH TWICE DAILY 05/25/17  Yes Lennette Bihari, MD  omeprazole (PRILOSEC OTC) 20 MG tablet Take 20 mg by mouth daily.   Yes [provider]    Family History Family History  Problem Relation Age of Onset  . Heart disease Mother   . Cancer Neg Hx     Social History Social History  Substance Use Topics  . Smoking status: Former Games developer  . Smokeless tobacco: Never Used  . Alcohol use No     Allergies   Hydralazine; Penicillins; Amlodipine; Amlodipine besylate; Codeine; Lipitor [atorvastatin calcium]; Valsartan; Zetia [ezetimibe]; and Penicillin g   Review of Systems Review of Systems  Constitutional: Positive for diaphoresis. Negative for chills and fever.  HENT: Negative for congestion and sore throat.   Eyes: Negative for visual disturbance.  Respiratory: Negative for cough and shortness of breath.   Cardiovascular: Negative for chest pain.  Gastrointestinal: Positive for abdominal pain and nausea. Negative for diarrhea and vomiting.  Genitourinary: Negative for dysuria, flank pain, frequency, hematuria, scrotal swelling, testicular pain and urgency.  Musculoskeletal: Negative for arthralgias and myalgias.  Skin: Negative for rash.  Neurological: Negative for dizziness, syncope, weakness, light-headedness, numbness and headaches.  Psychiatric/Behavioral: Negative for sleep disturbance. The patient is not nervous/anxious.      Physical Exam Updated Vital Signs BP (!) 197/76 (BP Location: Right Arm)   Pulse 77   Temp 97.8 F (36.6 C) (Oral)   Resp 20   Ht  (1.778 m)   Wt 74.8 kg (165 lb)   SpO2 96%   BMI 23.68 kg/m   Physical Exam  Constitutional: He is oriented to person, place, and time. He appears well-developed and well-nourished.  Non-toxic appearance. He appears distressed (due to pain).  HENT:  Head: Normocephalic and atraumatic.  Mouth/Throat: Oropharynx is clear and moist.  Eyes: Pupils are  equal, round, and reactive to light. Conjunctivae are normal. Right eye exhibits no discharge. Left eye exhibits no discharge.  Neck: Normal range of motion. Neck supple.  Cardiovascular: Normal rate, regular rhythm, normal heart sounds and intact distal pulses.  Exam reveals no gallop and no friction rub.   No murmur heard. Pulmonary/Chest: Effort normal and breath sounds normal. No respiratory distress. He has no wheezes. He has no rales. He exhibits no tenderness.  Abdominal: Soft. Bowel sounds are normal. He exhibits no distension, no pulsatile liver and no pulsatile midline mass. There is generalized tenderness. There is rigidity and guarding. There is no rebound, no CVA tenderness, no tenderness at McBurney's point and negative Murphy's sign.  Musculoskeletal: Normal range of motion. He exhibits no tenderness.  Lymphadenopathy:    He has no cervical adenopathy.  Neurological: He is alert and oriented to person, place, and time.  Skin: Skin is warm. Capillary refill takes less than 2 seconds. No rash noted. He is diaphoretic. There is pallor.  Psychiatric: His behavior is normal. Judgment and thought content normal.  Nursing note and vitals reviewed.    ED Treatments / Results  Labs (all labs ordered are listed, but only abnormal results are displayed) Labs Reviewed  COMPREHENSIVE METABOLIC PANEL - Abnormal; Notable for the following:  Result Value   BUN 34 (*)    Creatinine, Ser 1.51 (*)    GFR calc non Af Amer 43 (*)    GFR calc Af Amer 50 (*)    All other components within normal limits  CBC - Abnormal; Notable for the following:    WBC 12.3 (*)    RBC 3.71 (*)    Hemoglobin 11.1 (*)    HCT 32.2 (*)    All other components within normal limits  I-STAT CHEM 8, ED - Abnormal; Notable for the following:    BUN 33 (*)    Creatinine, Ser 1.60 (*)    Calcium, Ion 1.13 (*)    Hemoglobin 11.2 (*)    HCT 33.0 (*)    All other components within normal limits  CBG  MONITORING, ED - Abnormal; Notable for the following:    Glucose-Capillary 141 (*)    All other components within normal limits  LIPASE, BLOOD  URINALYSIS, ROUTINE W REFLEX MICROSCOPIC  I-STAT TROPONIN, ED  I-STAT CG4 LACTIC ACID, ED  I-STAT TROPONIN, ED  I-STAT BETA HCG BLOOD, ED ([facility], [facility], [facility] ONLY)    EKG  EKG Interpretation  Date/Time:  Monday September 10 2017 04:12:17 EDT Ventricular Rate:  58 PR Interval:    QRS Duration: 100 QT Interval:  463 QTC Calculation: 455 R Axis:   76 Text Interpretation:  Sinus rhythm Atrial premature complex Nonspecific repol abnormality, lateral leads Confirmed by Nicanor Alcon, April ([id number]) on 09/10/2017 6:58:37 AM       Radiology Ct Angio Chest/abd/pel For Dissection W And/or W/wo  Result Date: 09/10/2017 CLINICAL DATA:  Patient woke up with severe abdominal pain and nausea. History of renal insufficiency, hypertension, diabetes. EXAM: CT ANGIOGRAPHY CHEST, ABDOMEN AND PELVIS TECHNIQUE: Multidetector CT imaging through the chest, abdomen and pelvis was performed using the standard protocol during bolus administration of intravenous contrast. Multiplanar reconstructed images and MIPs were obtained and reviewed to evaluate the vascular anatomy. CONTRAST:  75 mL Isovue 350 COMPARISON:  None. FINDINGS: CTA CHEST FINDINGS Cardiovascular: Noncontrast images of the chest demonstrate postoperative changes consistent with previous sternotomy and coronary bypass grafts. Aortic calcification. No evidence of intramural hematoma. Calcified granulomas in the spleen. Images obtained during arterial phase after intravenous contrast material was administered demonstrate normal caliber patent thoracic aorta and great vessel origins. No evidence of aortic aneurysm or dissection. Normal heart size. No pericardial effusion. Mediastinum/Nodes: No enlarged mediastinal, hilar, or axillary lymph nodes. Thyroid gland, trachea, and esophagus demonstrate no significant findings.  Lungs/Pleura: Evaluation is limited due to motion artifact. Dependent atelectasis demonstrated in both lung bases. No pleural effusions. No pneumothorax. Airways are patent. Musculoskeletal: No chest wall abnormality. No acute or significant osseous findings. Review of the MIP images confirms the above findings. CTA ABDOMEN AND PELVIS FINDINGS VASCULAR Aorta: Normal caliber abdominal aorta. Diffuse calcification throughout the abdominal aorta. No aneurysm or dissection. Celiac: Celiac axis is patent with calcification demonstrated at the origin of the celiac trunk. Can't exclude some degree of stenosis. SMA: Superior mesenteric artery is patent with calcification at the origin and proximal and midportion. Can't exclude some degree of calcification. Renals: Single renal arteries bilaterally are patent. Scattered calcification throughout the renal arteries. IMA: Inferior mesenteric artery is patent with dense calcification at the origin, likely resulting in stenosis. Inflow: Dense calcification of vessels is demonstrated although the vessels remain patent. Veins: No obvious venous abnormality within the limitations of this arterial phase study. Review of the MIP images confirms the above findings.  NON-VASCULAR Hepatobiliary: Large area arterial phase enhancement in the junction of segments 4 and 5 of the liver measuring 6.2 cm diameter. This could represent an atypical hemangioma or more likely transient hepatic attenuation difference. Consider ultrasound for further evaluation to exclude a focal mass lesion. Cholelithiasis with several stones in the gallbladder. No gallbladder wall thickening or edema. No bile duct dilatation. Pancreas: Pancreas is atrophic. Spleen: Normal in size without focal abnormality. Adrenals/Urinary Tract: Adrenal glands are unremarkable. Kidneys are normal, without renal calculi, focal lesion, or hydronephrosis. Bladder is unremarkable. Stomach/Bowel: Stomach, small bowel, and colon are not  abnormally distended. Stool fills the colon. No inflammatory changes. Appendix is surgically absent. Lymphatic: No significant lymphadenopathy. Reproductive: Prostate gland is enlarged, measuring 5.3 cm diameter. Other: No free air or free fluid in the abdomen. Periumbilical hernia containing fat. Musculoskeletal: No acute or significant osseous findings. Review of the MIP images confirms the above findings. IMPRESSION: 1. No evidence of aneurysm or dissection of the thoracic or abdominal aorta. 2. Extensive aortic and major vessel calcifications. Visualized vessels appear patent but areas of nonocclusive stenosis are not excluded. Aortic atherosclerosis. 3. Cholelithiasis without evidence of cholecystitis. 4. Area of arterial phase enhancement in segments 4-5 of the liver likely represent transient hepatic attenuation difference. Consider ultrasound for further evaluation to exclude a focal mass lesion. 5. Prostate gland is enlarged. Electronically Signed   By: Burman Nieves M.D.   On: 09/10/2017 06:13    Procedures Procedures (including critical care time)  Medications Ordered in ED Medications  fentaNYL (SUBLIMAZE) injection 50 mcg (50 mcg Intravenous Given 09/10/17 0426)  ondansetron (ZOFRAN) injection 4 mg (4 mg Intravenous Given 09/10/17 0426)  sodium chloride 0.9 % bolus 1,000 mL (0 mLs Intravenous Stopped 09/10/17 0536)  iopamidol (ISOVUE-370) 76 % injection 100 mL (75 mLs Intravenous Contrast Given 09/10/17 0543)  fentaNYL (SUBLIMAZE) injection 12.5 mcg (12.5 mcg Intravenous Given 09/10/17 0536)  fentaNYL (SUBLIMAZE) injection 25 mcg (25 mcg Intravenous Given 09/10/17 0653)     Initial Impression / Assessment and Plan / ED Course  I have reviewed the triage vital signs and the nursing notes.  Pertinent labs & imaging results that were available during my care of the patient were reviewed by me and considered in my medical decision making (see chart for details).    Pt presents to the ED  with complaints of severe abd pain with associated nausea and emesis. Pt with sign hx of cad and pad. Concern for possible aortic dissection vs mesenteric ischemia. Denies any associated fever, change in bowel habits, cp, sob, urinary symptoms.   Pt with elevated bp however when taken manually it was 150/70. Likely elevated secondary to pain. Pt had pain out of proportion to exam of abd. Lungs clear to auscultation.   Pt is not tachycardic. He is afebrile. Pt appears to be in sig amount of pain. Pt given one does of fentyl and states he pain was better however was very groggy from the pain medicine.  No leukocytosis. Negative lactic acid. Mild elevation in creatine unknown baseline. Electrolytes normal. US shows no signs of infection. Trop negative. EKG shows no ischemic changes.   CT scan was obtained that showed no evidence of aneurysm or dissection of the thoracic or abdominal aorta, extensive aortic and major vessel calcifications, visualized vessels appear patent but areas of nonocclusive stenosis are not excluded, aortic atherosclerosis, cholelithiasis without evidence of cholecystitis, area of arterial phase enhancement in segments 4-5 of the liver likely represent transient hepatic attenuation  difference. Consider ultrasound for further evaluation to exclude a focal mass lesion, prostate gland is enlarged.   Pt continues to be in a sig amount of pain and emesis despite sever rounds of pain and nausea medication. Pressure continues to be elevated.  Unsure of etiology of pt symptoms.   Dr. Nicanor Alcon did speak with the radiologist Dr. Andria Meuse concerning the follow up US for liver findings. They recommends mri however pt creatine is elevated and not receive contrasted mri study.   Given pt continued pain and emesis feel that pt needs admission for further work up and management of symptoms. Spoke with Dr. Mickle Mallory with hospital medicine who agrees to admit pt but would like for me to consult general  surgery.   Spoke with PA for general surgery at [phone] who will see patient in consultation.  Pt informed of plan of care. Continues to remain in pain. Additional pain medicine ordered. Pt given iv fluids. Pt was seen an eval by Dr. Nicanor Alcon who is agreeable to the above plan.   Final Clinical Impressions(s) / ED Diagnoses   Final diagnoses:  Abdominal pain    New Prescriptions New Prescriptions   No medications on file     Wallace Keller 09/10/17 1333    Palumbo, April, MD 09/13/17 [phone]

## 2017-09-10 NOTE — Consult Note (Signed)
Henry [name]: 1:26 PM 09/10/2017  LOS: 0 days    Referring Provider: Dr Mickle Mallory  Primary Care Physician:  Romero Belling, Henry Primary Gastroenterologist:  He has never seen a gastrointestinal physician.     Reason for Consultation:  Diffuse abdominal pain.  Clear emesis.   HPI: Henry Smola. is a 76 y.o. male.  PMH DM1 since age 50, (Hgb A1c 10.2 in 06/2017).  CAD.  5 vsl CABG 1995, PCI in 2011.  Htn.  PAD.  S/p CEA.  Retinopathy.  S/p bil cataract surgery.  S/p appendectomy age 50.  S/p stapedectomy.   Hearing loss.  15 yrs ago he developed an acute anemia due to medication.  Cologuard negative in 2017.  Has never undergone colonoscopy or upper endoscopy   Most recent issues have been with balance and gait disorder, weakness. He is in process of neurologic workup and had an EEG which was unremarkable. Brain MRI/MRA planned.  Patient also reports that in the last 3-4 weeks his blood sugars have become elevated and run anywhere from the 200s into the 400s.  He does not use sliding scale insulin just 70/30.  On Wednesday 9/12 patient had nonfocal mid abdominal pain and a single episode of nausea, vomited undigested food.  After vomiting he felt well, the abdominal pain resolved and for the next several days he was back to normal which means daily brown stools, no dysphagia, no heartburn, no abdominal pain.  He thought he had had some food poisoning. Patient woke up early this morning with recurrent, more intense, diffuse abdominal pain with nausea and 4 or so episodes of water-like emesis. Emesis did not contain any food product, blood or other suspicious material.  No fever or chills.  Although the pain has been present since it started early this morning, his abdomen is not tender to the touch. Unable to elicit  tenderness with abdominal exam. CT angiography chest/abdomen/pelvis revealed diffuse abdominal aortic calcification without dissection or aneurysm. Calcifications seen in the celiac trunk, SMA, IMA but unable to exclude some degree of calcification in the SMA, probable IMA stenosis, unable to exclude stenosis in the celiac Nonvascular findings included large area of arterial phase in enhancement in segments 4 and 5 of the liver possibly representing atypical hemangioma or more likely, transient hepatic attenuation. Gallstones but no gallbladder wall thickening present. Pancreatic atrophy. Stomach, S/P,: Nondistended. Stool within the colon. No inflammatory changes in the intestine. Abd ultrasound: no liver mass.  Suspect liver mass seen on CT is transient attenuation difference.  Gallstones and sludge, without inflammation.  Trace right pleural effusion.   Hgb 11.1.  WBCs 12.3. Slight worsening of stage 3 CKD.   SBPs elevated to 218, pulse in the mid 20s  Symptoms are better, but recur within 90 minutes of receiving fentanyl.  Since receiving the Zofran at 4:30 AM, he has not had recurrent nausea or vomiting.   Past Medical History:  Diagnosis Date  . ANEMIA-NOS 07/10/2007  . CORONARY ARTERY DISEASE 07/10/2007  . DIABETES MELLITUS, TYPE I 07/10/2007  .  HYPERLIPIDEMIA 07/10/2007  . HYPERTENSION 07/10/2007  . Peripheral arterial disease (HCC)   . 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 RENAL INSUFFICIENCY 07/10/2007    Past Surgical History:  Procedure Laterality Date  . APPENDECTOMY    . CARDIAC CATHETERIZATION     stenting in the vein graft to the right coronary artery and had_DES 3x 26 mm Integrity stent inserted, post dilated to 3.4 mm  . CAROTID ENDARTERECTOMY Right    right  . CORONARY ARTERY BYPASS GRAFT  02/2010   PTCA of his right coroary artery.     Prior to Admission medications   Medication Sig Start Date End Date Taking? Authorizing Provider  acetaminophen  (TYLENOL) 500 MG tablet Take 500 mg by mouth daily as needed (pain).   Yes Provider, Historical, Henry  aspirin EC 81 MG tablet Take 81 mg by mouth daily.   Yes Provider, Historical, Henry  B Complex-C-Folic Acid (FOLBEE PLUS) TABS Take 1 tablet by mouth daily.     Yes Provider, Historical, Henry  Cholecalciferol (VITAMIN D) 2000 UNITS CAPS Take 2,000 Units by mouth daily.    Yes Provider, Historical, Henry  gabapentin (NEURONTIN) 100 MG capsule Take 100 mg by mouth 2 (two) times daily.    Yes Provider, Historical, Henry  hydrochlorothiazide (MICROZIDE) 12.5 MG capsule Take 12.5 mg by mouth daily.   Yes Provider, Historical, Henry  insulin NPH-regular Human (NOVOLIN 70/30) (70-30) 100 UNIT/ML injection 42 units with breakfast and 25 units with supper 10/06/16  Yes Romero Belling, Henry  isosorbide mononitrate (IMDUR) 60 MG 24 hr tablet TAKE 1 TABLET BY MOUTH DAILY 04/23/17  Yes Lennette Bihari, Henry  labetalol (NORMODYNE) 200 MG tablet TAKE 1 TABLET(200 MG) BY MOUTH TWICE DAILY 05/25/17  Yes Lennette Bihari, Henry  omeprazole (PRILOSEC OTC) 20 MG tablet Take 20 mg by mouth daily.   Yes Provider, Historical, Henry    Scheduled Meds: . aspirin EC  81 mg Oral Daily  . gabapentin  100 mg Oral BID  . heparin  5,000 Units Subcutaneous Q8H  . hydrochlorothiazide  12.5 mg Oral Daily  . insulin aspart  0-9 Units Subcutaneous Q6H  . insulin aspart protamine- aspart  15 Units Subcutaneous BID WC  . isosorbide mononitrate  60 mg Oral Daily  . labetalol  200 mg Oral BID  . pantoprazole (PROTONIX) IV  40 mg Intravenous Q24H   Infusions: . sodium chloride     PRN Meds: fentaNYL (SUBLIMAZE) injection, morphine injection, ondansetron (ZOFRAN) IV   Allergies as of 09/10/2017 - Review Complete 09/10/2017  Allergen Reaction Noted  . Hydralazine Swelling 07/16/2015  . Penicillins Other (See Comments) 07/10/2007  . Amlodipine Swelling 11/30/2015  . Amlodipine besylate Swelling   . Codeine Other (See Comments)   . Lipitor  [atorvastatin calcium] Other (See Comments) 05/12/2011  . Valsartan Other (See Comments) and Swelling   . Zetia [ezetimibe] Other (See Comments) 03/29/2017  . Penicillin g Rash 11/30/2015    Family History  Problem Relation Age of Onset  . Heart disease Mother   . Cancer Neg Hx     Social History   Social History  . Marital status: Married    Spouse name: N/A  . Number of children: N/A  . Years of education: BS   Occupational History  . retired     14 yrs ago   Social History Main Topics  . Smoking status: Former Games developer  . Smokeless tobacco: Never Used  . Alcohol use No  . Drug use:  No  . Sexual activity: Not on file   Other Topics Concern  . Not on file   Social History Narrative   Says his diet and exercise are very good   Married lives with wife    REVIEW OF SYSTEMS: Constitutional:  He feels tired and weak currently but generally he has good level of energy and no fatigue or weakness. ENT:  No nose bleeds Pulm:  Dry cough. No dyspnea. CV:  No palpitations, no LE edema.  GU:  No hematuria, no frequency GI:  Per HPI Heme:  Again about 15 years ago he was started on 2 or 3 different medications and ended up with anemia. He did not require transfusions but he was referred to a hematologist.   Transfusions:  None Neuro:  No headaches, no peripheral tingling or numbness Derm:  No itching, no rash.  After his bypass surgery, the vein harvest site on the left leg developed difficult to heal wound which has bothered him ever since.  Endocrine:  No sweats or chills.  No polyuria or dysuria Immunization:  Reviewed. His last Tdap was in 2005. He is received both pneumococcal 13 and pneumococcal 23 vaccinations Travel:  None beyond local counties in last few months.    PHYSICAL EXAM: Vital signs in last 24 hours: Vitals:   09/10/17 1220 09/10/17 1306  BP: (!) 218/61 (!) 215/78  Pulse: 79 77  Resp: (!) 25 (!) 23  Temp:    SpO2: 94% 91%   Wt Readings from Last 3  Encounters:  09/10/17 74.8 kg (165 lb)  08/23/17 73.9 kg (162 lb 14.4 oz)  08/06/17 72.5 kg (159 lb 12.8 oz)    General: Pleasant, does not look unwell though he looks a bit tired, Senior WM.  Comfortable Head:  No facial asymmetry or swelling. No signs of head trauma.  Eyes:  No scleral icterus, no conjunctival pallor. Ears:  Slightly hard of hearing.  Nose:  No congestion or discharge Mouth:  Moist, clear, pink oral mucosa. Tongue midline. Neck:  No JVD, no thyromegaly, no masses. Lungs:  Clear bilaterally. No dyspnea. Infrequent dry cough of short duration. Heart: RRR. No MRG. S1, S2 present Abdomen:  Soft. Nondistended. Diffuse superficial swelling symmetrically in the lower abdomen. Patient says the swelling has been there for a while and is in an area where he frequently injects his insulin.   Rectal: No masses. Stool is medium brown and FOBT negative.     Musc/Skeltl: No joint redness, swelling or deformities. Extremities:  No CCE. There is a slight crusted eschar in the anterior of the lower left leg.  Neurologic:  Alert. No tremor. No limb weakness. Oriented times 3. Good historian. No gross deficits. Skin:  No rashes, no sores, no telangiectasia. Tattoos:  None observed Nodes:  No cervical or inguinal adenopathy.   Psych:  Pleasant, relaxed, cooperative. In good spirits.  Intake/Output from previous day: 09/16 0701 - 09/17 0700 In: 1000 [IV Piggyback:1000] Out: -  Intake/Output this shift: No intake/output data recorded.  LAB RESULTS:  Recent Labs  09/10/17 0420 09/10/17 0433  WBC 12.3*  --   HGB 11.1* 11.2*  HCT 32.2* 33.0*  PLT 267  --    BMET Lab Results  Component Value Date   NA 142 09/10/2017   NA 138 09/10/2017   NA 139 05/17/2017   K 3.7 09/10/2017   K 3.7 09/10/2017   K 4.3 05/17/2017   CL 107 09/10/2017   CL 106 09/10/2017  CL 107 05/17/2017   CO2 22 09/10/2017   CO2 21 (L) 05/17/2017   CO2 23 05/03/2017   GLUCOSE 89 09/10/2017   GLUCOSE  94 09/10/2017   GLUCOSE 296 (H) 05/17/2017   BUN 33 (H) 09/10/2017   BUN 34 (H) 09/10/2017   BUN 45 (H) 05/17/2017   CREATININE 1.60 (H) 09/10/2017   CREATININE 1.51 (H) 09/10/2017   CREATININE 1.50 (H) 05/17/2017   CALCIUM 9.4 09/10/2017   CALCIUM 9.0 05/17/2017   CALCIUM 9.1 05/03/2017   LFT  Recent Labs  09/10/17 0420  PROT 7.3  ALBUMIN 3.7  AST 24  ALT 19  ALKPHOS 80  BILITOT 0.8   PT/INR Lab Results  Component Value Date   INR 1.08 05/17/2017   INR 1.16 02/28/2010   INR 1.02 02/27/2010   Hepatitis Panel No results for input(s): HEPBSAG, HCVAB, HEPAIGM, HEPBIGM in the last 72 hours. C-Diff No components found for: CDIFF Lipase     Component Value Date/Time   LIPASE 19 09/10/2017 0420    Drugs of Abuse     Component Value Date/Time   LABOPIA NONE DETECTED 05/17/2017 1700   COCAINSCRNUR NONE DETECTED 05/17/2017 1700   LABBENZ NONE DETECTED 05/17/2017 1700   AMPHETMU NONE DETECTED 05/17/2017 1700   THCU NONE DETECTED 05/17/2017 1700   LABBARB NONE DETECTED 05/17/2017 1700     RADIOLOGY STUDIES: US Abdomen Limited  Result Date: 09/10/2017 CLINICAL DATA:  76 year old male with abdominal pain. Follow-up questionable liver lesion on recent CT. Subsequent encounter. EXAM: ULTRASOUND ABDOMEN LIMITED RIGHT UPPER QUADRANT COMPARISON:  09/10/2017 CT. FINDINGS: Gallbladder: Gallstones measuring up to 1 cm. Minimal sludge. No gallbladder wall thickening. Patient not tender over the gallbladder during scanning per ultrasound technologist. Common bile duct: Diameter: 4.3 mm proximally. Mid to distal aspect not visualized secondary to bowel gas. Liver: No focal lesion identified. Within normal limits in parenchymal echogenicity. Portal vein is patent on color Doppler imaging with normal direction of blood flow towards the liver. Trace right pleural effusion. IMPRESSION: No liver mass is identified as questioned on recent CT. This suggests that CT finding is related to  transient hepatic attenuation difference. Gallstones with minimal gallbladder sludge. No ultrasound evidence of gallbladder inflammation. Trace right pleural effusion. Electronically Signed   By: Lacy Duverney M.D.   On: 09/10/2017 10:48   Ct Angio Chest/abd/pel For Dissection W And/or W/wo  Result Date: 09/10/2017 CLINICAL DATA:  Patient woke up with severe abdominal pain and nausea. History of renal insufficiency, hypertension, diabetes. EXAM: CT ANGIOGRAPHY CHEST, ABDOMEN AND PELVIS TECHNIQUE: Multidetector CT imaging through the chest, abdomen and pelvis was performed using the standard protocol during bolus administration of intravenous contrast. Multiplanar reconstructed images and MIPs were obtained and reviewed to evaluate the vascular anatomy. CONTRAST:  75 mL Isovue 350 COMPARISON:  None. FINDINGS: CTA CHEST FINDINGS Cardiovascular: Noncontrast images of the chest demonstrate postoperative changes consistent with previous sternotomy and coronary bypass grafts. Aortic calcification. No evidence of intramural hematoma. Calcified granulomas in the spleen. Images obtained during arterial phase after intravenous contrast material was administered demonstrate normal caliber patent thoracic aorta and great vessel origins. No evidence of aortic aneurysm or dissection. Normal heart size. No pericardial effusion. Mediastinum/Nodes: No enlarged mediastinal, hilar, or axillary lymph nodes. Thyroid gland, trachea, and esophagus demonstrate no significant findings. Lungs/Pleura: Evaluation is limited due to motion artifact. Dependent atelectasis demonstrated in both lung bases. No pleural effusions. No pneumothorax. Airways are patent. Musculoskeletal: No chest wall abnormality. No acute or significant osseous  findings. Review of the MIP images confirms the above findings. CTA ABDOMEN AND PELVIS FINDINGS VASCULAR Aorta: Normal caliber abdominal aorta. Diffuse calcification throughout the abdominal aorta. No aneurysm  or dissection. Celiac: Celiac axis is patent with calcification demonstrated at the origin of the celiac trunk. Can't exclude some degree of stenosis. SMA: Superior mesenteric artery is patent with calcification at the origin and proximal and midportion. Can't exclude some degree of calcification. Renals: Single renal arteries bilaterally are patent. Scattered calcification throughout the renal arteries. IMA: Inferior mesenteric artery is patent with dense calcification at the origin, likely resulting in stenosis. Inflow: Dense calcification of vessels is demonstrated although the vessels remain patent. Veins: No obvious venous abnormality within the limitations of this arterial phase study. Review of the MIP images confirms the above findings. NON-VASCULAR Hepatobiliary: Large area arterial phase enhancement in the junction of segments 4 and 5 of the liver measuring 6.2 cm diameter. This could represent an atypical hemangioma or more likely transient hepatic attenuation difference. Consider ultrasound for further evaluation to exclude a focal mass lesion. Cholelithiasis with several stones in the gallbladder. No gallbladder wall thickening or edema. No bile duct dilatation. Pancreas: Pancreas is atrophic. Spleen: Normal in size without focal abnormality. Adrenals/Urinary Tract: Adrenal glands are unremarkable. Kidneys are normal, without renal calculi, focal lesion, or hydronephrosis. Bladder is unremarkable. Stomach/Bowel: Stomach, small bowel, and colon are not abnormally distended. Stool fills the colon. No inflammatory changes. Appendix is surgically absent. Lymphatic: No significant lymphadenopathy. Reproductive: Prostate gland is enlarged, measuring 5.3 cm diameter. Other: No free air or free fluid in the abdomen. Periumbilical hernia containing fat. Musculoskeletal: No acute or significant osseous findings. Review of the MIP images confirms the above findings. IMPRESSION: 1. No evidence of aneurysm or  dissection of the thoracic or abdominal aorta. 2. Extensive aortic and major vessel calcifications. Visualized vessels appear patent but areas of nonocclusive stenosis are not excluded. Aortic atherosclerosis. 3. Cholelithiasis without evidence of cholecystitis. 4. Area of arterial phase enhancement in segments 4-5 of the liver likely represent transient hepatic attenuation difference. Consider ultrasound for further evaluation to exclude a focal mass lesion. 5. Prostate gland is enlarged. Electronically Signed   By: Burman Nieves M.D.   On: 09/10/2017 06:13     IMPRESSION:   *  Acute onset diffuse abdominal pain, nausea and clear emesis. CT angiography raising question of non-occlusive mesenteric vascular disease. Long history type 1 DM, with sugars increasingly difficult to control within the last month.  Raises question of diabetic gastroparesis.  However historically has little in the way of GI symptoms suggesting gastroparesis.    PLAN:     *  EGD set for noon tomorrow.  Allow clears today, NPO after midnight.  Continue q 24 hours IV Protonix.  Jennye Moccasin  09/10/2017, 1:26 PM Pager: [phone]  GI ATTENDING  History, laboratories, x-rays reviewed. Patient personally seen and examined. Wife in room. Patient with abrupt onset mid to lower abdominal pain 1 AM awaking him from sleep.Several episodes of vomiting with transient relief. Came to the emergency room approximately 4 AM. Workup as outlined above without obvious cause for pain. He does have gallstones without obvious cholecystitis. CT scan as described above. No acute findings. No high-grade vascular stenosis. No luminal intestinal obstruction.Similar problem one week ago which improved after vomiting once..His physical exam is fairly unremarkable. No significant abdominal pain to palpation. Good bowel sounds. Long-standing diabetic. Plan EGD tomorrow to rule out acid peptic disorder.The nature of the procedure, as well  as the  risks, benefits, and alternatives were carefully and thoroughly reviewed with the patient. Ample time for discussion and questions allowed. The patient understood, was satisfied, and agreed to proceed. Will consider gastric emptying scan as well. Continue supportive care.we'll follow.  Wilhemina Bonito. Eda Keys., M.D. [facility]

## 2017-09-10 NOTE — Progress Notes (Signed)
Please call [name] at [phone] at [phone] for report. Henry [name]

## 2017-09-10 NOTE — ED Notes (Signed)
Bed: WA09 Expected date:  Expected time:  Means of arrival:  Comments: 76 yo M/Abd pain

## 2017-09-10 NOTE — H&P (Signed)
History and Physical  Henry [name]. NGE:[id number] DOB: 1941-11-01 DOA: 09/10/2017  PCP:  Romero Belling, MD   Chief Complaint:  Abdominal pain   History of Present Illness:  Pt is a15 yo male with hx of  CAD s/p CABG and stent placements, HTN, DMII who came with cc of severe lower abdominal pain, started 1 am this morning, woke him up, non radiating, associated with nausea and vomiting w/o constipation or diarrhea, constant, w/o fever/chills. He had similar episode once last week that resolved on its own. No other complaints.   Review of Systems:  CONSTITUTIONAL:     No night sweats.  No fatigue.  No fever. No chills. Eyes:                            No visual changes.  No eye pain.  No eye discharge.   ENT:                              No epistaxis.  No sinus pain.  No sore throat.   No congestion. RESPIRATORY:           No cough.  No wheeze.  No hemoptysis.  No dyspnea CARDIOVASCULAR   :  No chest pains.  No palpitations. GASTROINTESTINAL:  +abdominal pain.  +nausea. +vomiting.  No diarrhea. No   constipation.  No hematemesis.  No hematochezia.  No melena. GENITOURINARY:      No urgency.  No frequency.  No dysuria.  No hematuria.  No    obstructive symptoms.  No discharge.  No pain.   MUSCULOSKELETAL:  No musculoskeletal pain.  No joint swelling.  No arthritis. NEUROLOGICAL:        No confusion.  No weakness. No headache. No seizure. PSYCHIATRIC:             No depression. No anxiety. No suicidal ideation. SKIN:                             No rashes.  No lesions.  No wounds. ENDOCRINE:                No weight loss.  No polydipsia.  No polyuria.  No polyphagia. HEMATOLOGIC:           No purpura.  No petechiae.  No bleeding.  ALLERGIC                 : No pruritus.  No angioedema Other:  Past Medical and Surgical History:   Past Medical History:  Diagnosis Date  . ANEMIA-NOS 07/10/2007  . CORONARY ARTERY DISEASE 07/10/2007  . DIABETES MELLITUS, TYPE I 07/10/2007  .  HYPERLIPIDEMIA 07/10/2007  . HYPERTENSION 07/10/2007  . Peripheral arterial disease (HCC)   . 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 RENAL INSUFFICIENCY 07/10/2007   Past Surgical History:  Procedure Laterality Date  . APPENDECTOMY    . CARDIAC CATHETERIZATION     stenting in the vein graft to the right coronary artery and had_DES 3x 26 mm Integrity stent inserted, post dilated to 3.4 mm  . CAROTID ENDARTERECTOMY Right    right  . CORONARY ARTERY BYPASS GRAFT  02/2010   PTCA of his right coroary artery.     Social History:   reports that he has quit smoking. He has never used smokeless tobacco. He reports that  he does not drink alcohol or use drugs.    Allergies  Allergen Reactions  . Hydralazine Swelling    Feet swelled so bad they were cracking Severe foot swelling  . Penicillins Other (See Comments)    Per allergy test.  . Amlodipine Swelling  . Amlodipine Besylate Swelling  . Codeine Other (See Comments)    anxiety  . Lipitor [Atorvastatin Calcium] Other (See Comments)    myalgia  . Valsartan Other (See Comments) and Swelling    Caused kidney damage  . Zetia [Ezetimibe] Other (See Comments)    Severe dizziness  . Penicillin G Rash    Family History  Problem Relation Age of Onset  . Heart disease Mother   . Cancer Neg Hx       Prior to Admission medications   Medication Sig Start Date End Date Taking? Authorizing Provider  acetaminophen (TYLENOL) 500 MG tablet Take 500 mg by mouth daily as needed (pain).   Yes [provider]  aspirin EC 81 MG tablet Take 81 mg by mouth daily.   Yes [provider]  B Complex-C-Folic Acid (FOLBEE PLUS) TABS Take 1 tablet by mouth daily.     Yes [provider]  Cholecalciferol (VITAMIN D) 2000 UNITS CAPS Take 2,000 Units by mouth daily.    Yes [provider]  gabapentin (NEURONTIN) 100 MG capsule Take 100 mg by mouth 2 (two) times daily.    Yes [provider]    hydrochlorothiazide (MICROZIDE) 12.5 MG capsule Take 12.5 mg by mouth daily.   Yes [provider]  insulin NPH-regular Human (NOVOLIN 70/30) (70-30) 100 UNIT/ML injection 42 units with breakfast and 25 units with supper 10/06/16  Yes Romero Belling, MD  isosorbide mononitrate (IMDUR) 60 MG 24 hr tablet TAKE 1 TABLET BY MOUTH DAILY 04/23/17  Yes Lennette Bihari, MD  labetalol (NORMODYNE) 200 MG tablet TAKE 1 TABLET(200 MG) BY MOUTH TWICE DAILY 05/25/17  Yes Lennette Bihari, MD  omeprazole (PRILOSEC OTC) 20 MG tablet Take 20 mg by mouth daily.   Yes [provider]    Physical Exam: BP (!) 211/67   Pulse 82   Temp 97.8 F (36.6 C) (Oral)   Resp 16   Ht  (1.778 m)   Wt 74.8 kg (165 lb)   SpO2 90%   BMI 23.68 kg/m   GENERAL :   Alert and cooperative, and appears to be in mild acute distress. HEAD:           normocephalic. EYES:            PERRL, EOMI.  vision is grossly intact. EARS:           hearing grossly intact. NECK:          supple CARDIAC:    Normal S1 and S2. No gallop. Systolic murmur  Vascular:     no peripheral edema.  LUNGS:       Clear to auscultation  ABDOMEN: Positive bowel sounds. Soft, nondistended, mild tenderness to palpation . No guarding or rebound.      MSK:           No joint erythema or tenderness.  EXT           : No significant deformity or joint abnormality. Neuro        : Alert, oriented to person, place, and time.  CN II-XII intact.  SKIN:            No rash. No lesions. PSYCH:       No hallucination. Patient is not suicidal.          Labs on Admission:  Reviewed.   Radiological Exams on Admission: Ct Angio Chest/abd/pel For Dissection W And/or W/wo  Result Date: 09/10/2017 CLINICAL DATA:  Patient woke up with severe abdominal pain and nausea. History of renal insufficiency, hypertension, diabetes. EXAM: CT ANGIOGRAPHY CHEST, ABDOMEN AND PELVIS TECHNIQUE: Multidetector CT imaging through the chest, abdomen  and pelvis was performed using the standard protocol during bolus administration of intravenous contrast. Multiplanar reconstructed images and MIPs were obtained and reviewed to evaluate the vascular anatomy. CONTRAST:  75 mL Isovue 350 COMPARISON:  None. FINDINGS: CTA CHEST FINDINGS Cardiovascular: Noncontrast images of the chest demonstrate postoperative changes consistent with previous sternotomy and coronary bypass grafts. Aortic calcification. No evidence of intramural hematoma. Calcified granulomas in the spleen. Images obtained during arterial phase after intravenous contrast material was administered demonstrate normal caliber patent thoracic aorta and great vessel origins. No evidence of aortic aneurysm or dissection. Normal heart size. No pericardial effusion. Mediastinum/Nodes: No enlarged mediastinal, hilar, or axillary lymph nodes. Thyroid gland, trachea, and esophagus demonstrate no significant findings. Lungs/Pleura: Evaluation is limited due to motion artifact. Dependent atelectasis demonstrated in both lung bases. No pleural effusions. No pneumothorax. Airways are patent. Musculoskeletal: No chest wall abnormality. No acute or significant osseous findings. Review of the MIP images confirms the above findings. CTA ABDOMEN AND PELVIS FINDINGS VASCULAR Aorta: Normal caliber abdominal aorta. Diffuse calcification throughout the abdominal aorta. No aneurysm or dissection. Celiac: Celiac axis is patent with calcification demonstrated at the origin of the celiac trunk. Can't exclude some degree of stenosis. SMA: Superior mesenteric artery is patent with calcification at the origin and proximal and midportion. Can't exclude some degree of calcification. Renals: Single renal arteries bilaterally are patent. Scattered calcification throughout the renal arteries. IMA: Inferior mesenteric artery is patent with dense calcification at the origin, likely resulting in stenosis. Inflow: Dense calcification of  vessels is demonstrated although the vessels remain patent. Veins: No obvious venous abnormality within the limitations of this arterial phase study. Review of the MIP images confirms the above findings. NON-VASCULAR Hepatobiliary: Large area arterial phase enhancement in the junction of segments 4 and 5 of the liver measuring 6.2 cm diameter. This could represent an atypical hemangioma or more likely transient hepatic attenuation difference. Consider ultrasound for further evaluation to exclude a focal mass lesion. Cholelithiasis with several stones in the gallbladder. No gallbladder wall thickening or edema. No bile duct dilatation. Pancreas: Pancreas is atrophic. Spleen: Normal in size without focal abnormality. Adrenals/Urinary Tract: Adrenal glands are unremarkable. Kidneys are normal, without renal calculi, focal lesion, or hydronephrosis. Bladder is unremarkable. Stomach/Bowel: Stomach, small bowel, and colon are not abnormally distended. Stool fills the colon. No inflammatory changes. Appendix is surgically absent. Lymphatic: No significant lymphadenopathy. Reproductive: Prostate gland is enlarged, measuring 5.3 cm diameter. Other: No free air or free fluid in the abdomen. Periumbilical hernia containing fat. Musculoskeletal: No acute or significant osseous findings. Review of the MIP images confirms the above findings. IMPRESSION: 1. No evidence of aneurysm or dissection of the thoracic or abdominal aorta. 2. Extensive aortic and major vessel calcifications. Visualized vessels appear patent but areas of nonocclusive stenosis are not excluded. Aortic atherosclerosis. 3. Cholelithiasis without evidence of cholecystitis. 4. Area of arterial phase enhancement in segments 4-5 of the  liver likely represent transient hepatic attenuation difference. Consider ultrasound for further evaluation to exclude a focal mass lesion. 5. Prostate gland is enlarged. Electronically Signed   By: Burman Nieves M.D.   On:  09/10/2017 06:13    EKG:  Independently reviewed. NSR  Assessment/Plan  Abdominal pain:  Unclear etiology to me  CT w/contrast unremarkable for inflammatory/ischemic process Liver enzymes/lipase neg. No hx of gastroparesis and pain is more lower Physical exam not consistent with symptoms, elevated d dimers and leukocytosis  may suggest ischemic bowels but CT is unremarkable and LA is neg. Surgery is following  Will consult GI as well to help with diagnostic evaluation of pain. Will check [place] to evaluate possible liver mass Leukocytosis w/o evidence of infection  EKG is NSR and initial trop is neg.  Keep NPO, morphine prn pain, zofran prn N/V and PPI daily  CKD: cr at baseline   HTN: cont home medications   DMII: keep on 15 units BIDAC for now and SS insulin   Hx of CAD s/p CABG/stents: no chest pain, initial EKG/trop unremarkable, will continue asp, pt is intolerant of statins. Continue BB.   Input & Output: NA Lines & Tubes: PIV DVT prophylaxis: [place] hep GI prophylaxis: PPI Consultants: gen surgery / GI  Code Status: full  Family Communication: at bedside  Disposition Plan: TBD    Eston Esters M.D [facility]

## 2017-09-11 ENCOUNTER — Inpatient Hospital Stay (HOSPITAL_COMMUNITY): Payer: Medicare Other | Admitting: Certified Registered Nurse Anesthetist

## 2017-09-11 ENCOUNTER — Inpatient Hospital Stay (HOSPITAL_COMMUNITY): Payer: Medicare Other

## 2017-09-11 ENCOUNTER — Encounter (HOSPITAL_COMMUNITY)
Admission: EM | Disposition: A | Discharge status: Home / Self Care | Payer: Self-pay | Source: Home / Self Care | Attending: Internal Medicine

## 2017-09-11 ENCOUNTER — Encounter (HOSPITAL_COMMUNITY): Payer: Self-pay | Admitting: *Deleted

## 2017-09-11 HISTORY — PX: ESOPHAGOGASTRODUODENOSCOPY: SHX5428

## 2017-09-11 LAB — CBC
HCT: 30.9 % — ABNORMAL LOW (ref 39.0–52.0)
HEMOGLOBIN: 10.3 g/dL — AB (ref 13.0–17.0)
MCH: 29.3 pg (ref 26.0–34.0)
MCHC: 33.3 g/dL (ref 30.0–36.0)
MCV: 88 fL (ref 78.0–100.0)
Platelets: 245 10*3/uL (ref 150–400)
RBC: 3.51 MIL/uL — AB (ref 4.22–5.81)
RDW: 13.5 % (ref 11.5–15.5)
WBC: 16.7 10*3/uL — AB (ref 4.0–10.5)

## 2017-09-11 LAB — GLUCOSE, CAPILLARY
GLUCOSE-CAPILLARY: 340 mg/dL — AB (ref 65–99)
Glucose-Capillary: 194 mg/dL — ABNORMAL HIGH (ref 65–99)
Glucose-Capillary: 219 mg/dL — ABNORMAL HIGH (ref 65–99)
Glucose-Capillary: 221 mg/dL — ABNORMAL HIGH (ref 65–99)
Glucose-Capillary: 388 mg/dL — ABNORMAL HIGH (ref 65–99)

## 2017-09-11 LAB — COMPREHENSIVE METABOLIC PANEL
ALK PHOS: 79 U/L (ref 38–126)
ALT: 18 U/L (ref 17–63)
ANION GAP: 9 (ref 5–15)
AST: 27 U/L (ref 15–41)
Albumin: 3.1 g/dL — ABNORMAL LOW (ref 3.5–5.0)
BILIRUBIN TOTAL: 0.9 mg/dL (ref 0.3–1.2)
BUN: 37 mg/dL — ABNORMAL HIGH (ref 6–20)
CALCIUM: 8.2 mg/dL — AB (ref 8.9–10.3)
CO2: 22 mmol/L (ref 22–32)
Chloride: 105 mmol/L (ref 101–111)
Creatinine, Ser: 1.54 mg/dL — ABNORMAL HIGH (ref 0.61–1.24)
GFR calc non Af Amer: 42 mL/min — ABNORMAL LOW (ref >60)
GFR, EST AFRICAN AMERICAN: 49 mL/min — AB (ref >60)
Glucose, Bld: 200 mg/dL — ABNORMAL HIGH (ref 65–99)
Potassium: 3.8 mmol/L (ref 3.5–5.1)
SODIUM: 136 mmol/L (ref 135–145)
TOTAL PROTEIN: 6.3 g/dL — AB (ref 6.5–8.1)

## 2017-09-11 SURGERY — EGD (ESOPHAGOGASTRODUODENOSCOPY)
Anesthesia: Monitor Anesthesia Care

## 2017-09-11 MED ORDER — PHENYLEPHRINE 40 MCG/ML (10ML) SYRINGE FOR IV PUSH (FOR BLOOD PRESSURE SUPPORT)
PREFILLED_SYRINGE | INTRAVENOUS | Status: DC | PRN
  Administered 2017-09-11: 80 ug via INTRAVENOUS

## 2017-09-11 MED ORDER — LACTATED RINGERS IV SOLN
INTRAVENOUS | Status: DC
  Administered 2017-09-11: 12:00:00 via INTRAVENOUS

## 2017-09-11 MED ORDER — PROPOFOL 500 MG/50ML IV EMUL
INTRAVENOUS | Status: DC | PRN
  Administered 2017-09-11: 150 ug/kg/min via INTRAVENOUS

## 2017-09-11 MED ORDER — PROPOFOL 10 MG/ML IV BOLUS
INTRAVENOUS | Status: AC
  Filled 2017-09-11: qty 20

## 2017-09-11 MED ORDER — INSULIN GLARGINE 100 UNIT/ML ~~LOC~~ SOLN: 10.0000 [IU] | Freq: Every day | SUBCUTANEOUS | Status: DC

## 2017-09-11 MED ORDER — CIPROFLOXACIN IN D5W 400 MG/200ML IV SOLN
400.0000 mg | Freq: Two times a day (BID) | INTRAVENOUS | Status: DC
  Administered 2017-09-12: 400 mg via INTRAVENOUS
  Filled 2017-09-11: qty 200

## 2017-09-11 MED ORDER — PHENOL 1.4 % MT LIQD
1.0000 | OROMUCOSAL | Status: DC | PRN
  Filled 2017-09-11: qty 177

## 2017-09-11 MED ORDER — SODIUM CHLORIDE 0.9 % IV BOLUS (SEPSIS)
500.0000 mL | Freq: Once | INTRAVENOUS | Status: AC
  Administered 2017-09-11: 500 mL via INTRAVENOUS

## 2017-09-11 MED ORDER — ACETAMINOPHEN 325 MG PO TABS
650.0000 mg | ORAL_TABLET | ORAL | Status: DC | PRN
  Administered 2017-09-11 – 2017-09-13 (×3): 650 mg via ORAL
  Filled 2017-09-11 (×3): qty 2

## 2017-09-11 MED ORDER — LIDOCAINE 2% (20 MG/ML) 5 ML SYRINGE
INTRAMUSCULAR | Status: DC | PRN
  Administered 2017-09-11: 80 mg via INTRAVENOUS

## 2017-09-11 MED ORDER — MENTHOL 3 MG MT LOZG
1.0000 | LOZENGE | OROMUCOSAL | Status: DC | PRN
  Administered 2017-09-11: 3 mg via ORAL
  Filled 2017-09-11: qty 9

## 2017-09-11 MED ORDER — INSULIN GLARGINE 100 UNIT/ML ~~LOC~~ SOLN
7.0000 [IU] | Freq: Every day | SUBCUTANEOUS | Status: DC
  Administered 2017-09-11: 7 [IU] via SUBCUTANEOUS
  Filled 2017-09-11 (×2): qty 0.07

## 2017-09-11 MED ORDER — PROPOFOL 10 MG/ML IV BOLUS
INTRAVENOUS | Status: DC | PRN
  Administered 2017-09-11: 30 mg via INTRAVENOUS
  Administered 2017-09-11: 20 mg via INTRAVENOUS
  Administered 2017-09-11: 10 mg via INTRAVENOUS

## 2017-09-11 NOTE — H&P (View-Only) (Signed)
                                                                           [facility] Consult: 1:26 PM 09/10/2017  LOS: 0 days    Referring Provider: Dr Hamad  Primary Care Physician:  Ellison, Sean, MD Primary Gastroenterologist:  He has never seen a gastrointestinal physician.     Reason for Consultation:  Diffuse abdominal pain.  Clear emesis.   HPI: Henry T Aspinall Jr. is a 75 y.o. male.  PMH DM1 since age 16, (Hgb A1c 10.2 in 06/2017).  CAD.  5 vsl CABG 1995, PCI in 2011.  Htn.  PAD.  S/p CEA.  Retinopathy.  S/p bil cataract surgery.  S/p appendectomy age 16.  S/p stapedectomy.   Hearing loss.  15 yrs ago he developed an acute anemia due to medication.  Cologuard negative in 2017.  Has never undergone colonoscopy or upper endoscopy   Most recent issues have been with balance and gait disorder, weakness. He is in process of neurologic workup and had an EEG which was unremarkable. Brain MRI/MRA planned.  Patient also reports that in the last 3-4 weeks his blood sugars have become elevated and run anywhere from the 200s into the 400s.  He does not use sliding scale insulin just 70/30.  On Wednesday 9/12 patient had nonfocal mid abdominal pain and a single episode of nausea, vomited undigested food.  After vomiting he felt well, the abdominal pain resolved and for the next several days he was back to normal which means daily brown stools, no dysphagia, no heartburn, no abdominal pain.  He thought he had had some food poisoning. Patient woke up early this morning with recurrent, more intense, diffuse abdominal pain with nausea and 4 or so episodes of water-like emesis. Emesis did not contain any food product, blood or other suspicious material.  No fever or chills.  Although the pain has been present since it started early this morning, his abdomen is not tender to the touch. Unable to elicit  tenderness with abdominal exam. CT angiography chest/abdomen/pelvis revealed diffuse abdominal aortic calcification without dissection or aneurysm. Calcifications seen in the celiac trunk, SMA, IMA but unable to exclude some degree of calcification in the SMA, probable IMA stenosis, unable to exclude stenosis in the celiac Nonvascular findings included large area of arterial phase in enhancement in segments 4 and 5 of the liver possibly representing atypical hemangioma or more likely, transient hepatic attenuation. Gallstones but no gallbladder wall thickening present. Pancreatic atrophy. Stomach, S/P,: Nondistended. Stool within the colon. No inflammatory changes in the intestine. Abd ultrasound: no liver mass.  Suspect liver mass seen on CT is transient attenuation difference.  Gallstones and sludge, without inflammation.  Trace right pleural effusion.   Hgb 11.1.  WBCs 12.3. Slight worsening of stage 3 CKD.   SBPs elevated to 218, pulse in the mid 20s  Symptoms are better, but recur within 90 minutes of receiving fentanyl.  Since receiving the Zofran at 4:30 AM, he has not had recurrent nausea or vomiting.   Past Medical History:  Diagnosis Date  . ANEMIA-NOS 07/10/2007  . CORONARY ARTERY DISEASE 07/10/2007  . DIABETES MELLITUS, TYPE I 07/10/2007  .   HYPERLIPIDEMIA 07/10/2007  . HYPERTENSION 07/10/2007  . Peripheral arterial disease (HCC)   . 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 RENAL INSUFFICIENCY 07/10/2007    Past Surgical History:  Procedure Laterality Date  . APPENDECTOMY    . CARDIAC CATHETERIZATION     stenting in the vein graft to the right coronary artery and had_DES 3x 26 mm Integrity stent inserted, post dilated to 3.4 mm  . CAROTID ENDARTERECTOMY Right    right  . CORONARY ARTERY BYPASS GRAFT  02/2010   PTCA of his right coroary artery.     Prior to Admission medications   Medication Sig Start Date End Date Taking? Authorizing Provider  acetaminophen  (TYLENOL) 500 MG tablet Take 500 mg by mouth daily as needed (pain).   Yes [provider]  aspirin EC 81 MG tablet Take 81 mg by mouth daily.   Yes [provider]  B Complex-C-Folic Acid (FOLBEE PLUS) TABS Take 1 tablet by mouth daily.     Yes [provider]  Cholecalciferol (VITAMIN D) 2000 UNITS CAPS Take 2,000 Units by mouth daily.    Yes [provider]  gabapentin (NEURONTIN) 100 MG capsule Take 100 mg by mouth 2 (two) times daily.    Yes [provider]  hydrochlorothiazide (MICROZIDE) 12.5 MG capsule Take 12.5 mg by mouth daily.   Yes [provider]  insulin NPH-regular Human (NOVOLIN 70/30) (70-30) 100 UNIT/ML injection 42 units with breakfast and 25 units with supper 10/06/16  Yes Ellison, Sean, MD  isosorbide mononitrate (IMDUR) 60 MG 24 hr tablet TAKE 1 TABLET BY MOUTH DAILY 04/23/17  Yes Kelly, Thomas A, MD  labetalol (NORMODYNE) 200 MG tablet TAKE 1 TABLET(200 MG) BY MOUTH TWICE DAILY 05/25/17  Yes Kelly, Thomas A, MD  omeprazole (PRILOSEC OTC) 20 MG tablet Take 20 mg by mouth daily.   Yes [provider]    Scheduled Meds: . aspirin EC  81 mg Oral Daily  . gabapentin  100 mg Oral BID  . heparin  5,000 Units Subcutaneous Q8H  . hydrochlorothiazide  12.5 mg Oral Daily  . insulin aspart  0-9 Units Subcutaneous Q6H  . insulin aspart protamine- aspart  15 Units Subcutaneous BID WC  . isosorbide mononitrate  60 mg Oral Daily  . labetalol  200 mg Oral BID  . pantoprazole (PROTONIX) IV  40 mg Intravenous Q24H   Infusions: . sodium chloride     PRN Meds: fentaNYL (SUBLIMAZE) injection, morphine injection, ondansetron (ZOFRAN) IV   Allergies as of 09/10/2017 - Review Complete 09/10/2017  Allergen Reaction Noted  . Hydralazine Swelling 07/16/2015  . Penicillins Other (See Comments) 07/10/2007  . Amlodipine Swelling 11/30/2015  . Amlodipine besylate Swelling   . Codeine Other (See Comments)   . Lipitor  [atorvastatin calcium] Other (See Comments) 05/12/2011  . Valsartan Other (See Comments) and Swelling   . Zetia [ezetimibe] Other (See Comments) 03/29/2017  . Penicillin g Rash 11/30/2015    Family History  Problem Relation Age of Onset  . Heart disease Mother   . Cancer Neg Hx     Social History   Social History  . Marital status: Married    Spouse name: N/A  . Number of children: N/A  . Years of education: BS   Occupational History  . retired     14 yrs ago   Social History Main Topics  . Smoking status: Former Smoker  . Smokeless tobacco: Never Used  . Alcohol use No  . Drug use:   No  . Sexual activity: Not on file   Other Topics Concern  . Not on file   Social History Narrative   Says his diet and exercise are very good   Married lives with wife    REVIEW OF SYSTEMS: Constitutional:  He feels tired and weak currently but generally he has good level of energy and no fatigue or weakness. ENT:  No nose bleeds Pulm:  Dry cough. No dyspnea. CV:  No palpitations, no LE edema.  GU:  No hematuria, no frequency GI:  Per HPI Heme:  Again about 15 years ago he was started on 2 or 3 different medications and ended up with anemia. He did not require transfusions but he was referred to a hematologist.   Transfusions:  None Neuro:  No headaches, no peripheral tingling or numbness Derm:  No itching, no rash.  After his bypass surgery, the vein harvest site on the left leg developed difficult to heal wound which has bothered him ever since.  Endocrine:  No sweats or chills.  No polyuria or dysuria Immunization:  Reviewed. His last Tdap was in 2005. He is received both pneumococcal 13 and pneumococcal 23 vaccinations Travel:  None beyond local counties in last few months.    PHYSICAL EXAM: Vital signs in last 24 hours: Vitals:   09/10/17 1220 09/10/17 1306  BP: (!) 218/61 (!) 215/78  Pulse: 79 77  Resp: (!) 25 (!) 23  Temp:    SpO2: 94% 91%   Wt Readings from Last 3  Encounters:  09/10/17 74.8 kg (165 lb)  08/23/17 73.9 kg (162 lb 14.4 oz)  08/06/17 72.5 kg (159 lb 12.8 oz)    General: Pleasant, does not look unwell though he looks a bit tired, Senior WM.  Comfortable Head:  No facial asymmetry or swelling. No signs of head trauma.  Eyes:  No scleral icterus, no conjunctival pallor. Ears:  Slightly hard of hearing.  Nose:  No congestion or discharge Mouth:  Moist, clear, pink oral mucosa. Tongue midline. Neck:  No JVD, no thyromegaly, no masses. Lungs:  Clear bilaterally. No dyspnea. Infrequent dry cough of short duration. Heart: RRR. No MRG. S1, S2 present Abdomen:  Soft. Nondistended. Diffuse superficial swelling symmetrically in the lower abdomen. Patient says the swelling has been there for a while and is in an area where he frequently injects his insulin.   Rectal: No masses. Stool is medium brown and FOBT negative.     Musc/Skeltl: No joint redness, swelling or deformities. Extremities:  No CCE. There is a slight crusted eschar in the anterior of the lower left leg.  Neurologic:  Alert. No tremor. No limb weakness. Oriented times 3. Good historian. No gross deficits. Skin:  No rashes, no sores, no telangiectasia. Tattoos:  None observed Nodes:  No cervical or inguinal adenopathy.   Psych:  Pleasant, relaxed, cooperative. In good spirits.  Intake/Output from previous day: 09/16 0701 - 09/17 0700 In: 1000 [IV Piggyback:1000] Out: -  Intake/Output this shift: No intake/output data recorded.  LAB RESULTS:  Recent Labs  09/10/17 0420 09/10/17 0433  WBC 12.3*  --   HGB 11.1* 11.2*  HCT 32.2* 33.0*  PLT 267  --    BMET Lab Results  Component Value Date   NA 142 09/10/2017   NA 138 09/10/2017   NA 139 05/17/2017   K 3.7 09/10/2017   K 3.7 09/10/2017   K 4.3 05/17/2017   CL 107 09/10/2017   CL 106 09/10/2017     CL 107 05/17/2017   CO2 22 09/10/2017   CO2 21 (L) 05/17/2017   CO2 23 05/03/2017   GLUCOSE 89 09/10/2017   GLUCOSE  94 09/10/2017   GLUCOSE 296 (H) 05/17/2017   BUN 33 (H) 09/10/2017   BUN 34 (H) 09/10/2017   BUN 45 (H) 05/17/2017   CREATININE 1.60 (H) 09/10/2017   CREATININE 1.51 (H) 09/10/2017   CREATININE 1.50 (H) 05/17/2017   CALCIUM 9.4 09/10/2017   CALCIUM 9.0 05/17/2017   CALCIUM 9.1 05/03/2017   LFT  Recent Labs  09/10/17 0420  PROT 7.3  ALBUMIN 3.7  AST 24  ALT 19  ALKPHOS 80  BILITOT 0.8   PT/INR Lab Results  Component Value Date   INR 1.08 05/17/2017   INR 1.16 02/28/2010   INR 1.02 02/27/2010   Hepatitis Panel No results for input(s): HEPBSAG, HCVAB, HEPAIGM, HEPBIGM in the last 72 hours. C-Diff No components found for: CDIFF Lipase     Component Value Date/Time   LIPASE 19 09/10/2017 0420    Drugs of Abuse     Component Value Date/Time   LABOPIA NONE DETECTED 05/17/2017 1700   COCAINSCRNUR NONE DETECTED 05/17/2017 1700   LABBENZ NONE DETECTED 05/17/2017 1700   AMPHETMU NONE DETECTED 05/17/2017 1700   THCU NONE DETECTED 05/17/2017 1700   LABBARB NONE DETECTED 05/17/2017 1700     RADIOLOGY STUDIES: Us Abdomen Limited  Result Date: 09/10/2017 CLINICAL DATA:  75-year-old male with abdominal pain. Follow-up questionable liver lesion on recent CT. Subsequent encounter. EXAM: ULTRASOUND ABDOMEN LIMITED RIGHT UPPER QUADRANT COMPARISON:  09/10/2017 CT. FINDINGS: Gallbladder: Gallstones measuring up to 1 cm. Minimal sludge. No gallbladder wall thickening. Patient not tender over the gallbladder during scanning per ultrasound technologist. Common bile duct: Diameter: 4.3 mm proximally. Mid to distal aspect not visualized secondary to bowel gas. Liver: No focal lesion identified. Within normal limits in parenchymal echogenicity. Portal vein is patent on color Doppler imaging with normal direction of blood flow towards the liver. Trace right pleural effusion. IMPRESSION: No liver mass is identified as questioned on recent CT. This suggests that CT finding is related to  transient hepatic attenuation difference. Gallstones with minimal gallbladder sludge. No ultrasound evidence of gallbladder inflammation. Trace right pleural effusion. Electronically Signed   By: Steven  Olson M.D.   On: 09/10/2017 10:48   Ct Angio Chest/abd/pel For Dissection W And/or W/wo  Result Date: 09/10/2017 CLINICAL DATA:  Patient woke up with severe abdominal pain and nausea. History of renal insufficiency, hypertension, diabetes. EXAM: CT ANGIOGRAPHY CHEST, ABDOMEN AND PELVIS TECHNIQUE: Multidetector CT imaging through the chest, abdomen and pelvis was performed using the standard protocol during bolus administration of intravenous contrast. Multiplanar reconstructed images and MIPs were obtained and reviewed to evaluate the vascular anatomy. CONTRAST:  75 mL Isovue 350 COMPARISON:  None. FINDINGS: CTA CHEST FINDINGS Cardiovascular: Noncontrast images of the chest demonstrate postoperative changes consistent with previous sternotomy and coronary bypass grafts. Aortic calcification. No evidence of intramural hematoma. Calcified granulomas in the spleen. Images obtained during arterial phase after intravenous contrast material was administered demonstrate normal caliber patent thoracic aorta and great vessel origins. No evidence of aortic aneurysm or dissection. Normal heart size. No pericardial effusion. Mediastinum/Nodes: No enlarged mediastinal, hilar, or axillary lymph nodes. Thyroid gland, trachea, and esophagus demonstrate no significant findings. Lungs/Pleura: Evaluation is limited due to motion artifact. Dependent atelectasis demonstrated in both lung bases. No pleural effusions. No pneumothorax. Airways are patent. Musculoskeletal: No chest wall abnormality. No acute or significant osseous   findings. Review of the MIP images confirms the above findings. CTA ABDOMEN AND PELVIS FINDINGS VASCULAR Aorta: Normal caliber abdominal aorta. Diffuse calcification throughout the abdominal aorta. No aneurysm  or dissection. Celiac: Celiac axis is patent with calcification demonstrated at the origin of the celiac trunk. Can't exclude some degree of stenosis. SMA: Superior mesenteric artery is patent with calcification at the origin and proximal and midportion. Can't exclude some degree of calcification. Renals: Single renal arteries bilaterally are patent. Scattered calcification throughout the renal arteries. IMA: Inferior mesenteric artery is patent with dense calcification at the origin, likely resulting in stenosis. Inflow: Dense calcification of vessels is demonstrated although the vessels remain patent. Veins: No obvious venous abnormality within the limitations of this arterial phase study. Review of the MIP images confirms the above findings. NON-VASCULAR Hepatobiliary: Large area arterial phase enhancement in the junction of segments 4 and 5 of the liver measuring 6.2 cm diameter. This could represent an atypical hemangioma or more likely transient hepatic attenuation difference. Consider ultrasound for further evaluation to exclude a focal mass lesion. Cholelithiasis with several stones in the gallbladder. No gallbladder wall thickening or edema. No bile duct dilatation. Pancreas: Pancreas is atrophic. Spleen: Normal in size without focal abnormality. Adrenals/Urinary Tract: Adrenal glands are unremarkable. Kidneys are normal, without renal calculi, focal lesion, or hydronephrosis. Bladder is unremarkable. Stomach/Bowel: Stomach, small bowel, and colon are not abnormally distended. Stool fills the colon. No inflammatory changes. Appendix is surgically absent. Lymphatic: No significant lymphadenopathy. Reproductive: Prostate gland is enlarged, measuring 5.3 cm diameter. Other: No free air or free fluid in the abdomen. Periumbilical hernia containing fat. Musculoskeletal: No acute or significant osseous findings. Review of the MIP images confirms the above findings. IMPRESSION: 1. No evidence of aneurysm or  dissection of the thoracic or abdominal aorta. 2. Extensive aortic and major vessel calcifications. Visualized vessels appear patent but areas of nonocclusive stenosis are not excluded. Aortic atherosclerosis. 3. Cholelithiasis without evidence of cholecystitis. 4. Area of arterial phase enhancement in segments 4-5 of the liver likely represent transient hepatic attenuation difference. Consider ultrasound for further evaluation to exclude a focal mass lesion. 5. Prostate gland is enlarged. Electronically Signed   By: William  Stevens M.D.   On: 09/10/2017 06:13     IMPRESSION:   *  Acute onset diffuse abdominal pain, nausea and clear emesis. CT angiography raising question of non-occlusive mesenteric vascular disease. Long history type 1 DM, with sugars increasingly difficult to control within the last month.  Raises question of diabetic gastroparesis.  However historically has little in the way of GI symptoms suggesting gastroparesis.    PLAN:     *  EGD set for noon tomorrow.  Allow clears today, NPO after midnight.  Continue q 24 hours IV Protonix.  Sarah Gribbin  09/10/2017, 1:26 PM Pager: [phone]  GI ATTENDING  History, laboratories, x-rays reviewed. Patient personally seen and examined. Wife in room. Patient with abrupt onset mid to lower abdominal pain 1 AM awaking him from sleep.Several episodes of vomiting with transient relief. Came to the emergency room approximately 4 AM. Workup as outlined above without obvious cause for pain. He does have gallstones without obvious cholecystitis. CT scan as described above. No acute findings. No high-grade vascular stenosis. No luminal intestinal obstruction.Similar problem one week ago which improved after vomiting once..His physical exam is fairly unremarkable. No significant abdominal pain to palpation. Good bowel sounds. Long-standing diabetic. Plan EGD tomorrow to rule out acid peptic disorder.The nature of the procedure, as well   as the  risks, benefits, and alternatives were carefully and thoroughly reviewed with the patient. Ample time for discussion and questions allowed. The patient understood, was satisfied, and agreed to proceed. Will consider gastric emptying scan as well. Continue supportive care.we'll follow.  John N. Perry, Jr., M.D. [facility] of Gastroenterology   

## 2017-09-11 NOTE — Transfer of Care (Signed)
Immediate Anesthesia Transfer of Care Note  Patient: Henry [name].  Procedure(s) Performed: Procedure(s): ESOPHAGOGASTRODUODENOSCOPY (EGD) (N/A)  Patient Location: PACU  Anesthesia Type:MAC  Level of Consciousness: Patient easily awoken, sedated, comfortable, cooperative, following commands, responds to stimulation.   Airway & Oxygen Therapy: Patient spontaneously breathing, ventilating well, oxygen via simple oxygen mask.  Post-op Assessment: Report given to PACU RN, vital signs reviewed and stable, moving all extremities.   Post vital signs: Reviewed and stable.  Complications: No apparent anesthesia complications  Last Vitals:  Vitals:   09/11/17 1130 09/11/17 1217  BP:  (!) 114/53  Pulse: 87 88  Resp: (!) 30 (!) 30  Temp:    SpO2: 91% 93%    Last Pain:  Vitals:   09/11/17 1125  TempSrc: Oral  PainSc:       Patients Stated Pain Goal: 0 (57/32/20 2542)  Complications: No apparent anesthesia complications

## 2017-09-11 NOTE — Anesthesia Postprocedure Evaluation (Signed)
Anesthesia Post Note  Patient: Cade Dashner.  Procedure(s) Performed: Procedure(s) (LRB): ESOPHAGOGASTRODUODENOSCOPY (EGD) (N/A)     Patient location during evaluation: PACU Anesthesia Type: MAC Level of consciousness: awake and alert Pain management: pain level controlled Vital Signs Assessment: post-procedure vital signs reviewed and stable Respiratory status: spontaneous breathing, nonlabored ventilation and respiratory function stable Cardiovascular status: stable and blood pressure returned to baseline Postop Assessment: no apparent nausea or vomiting Anesthetic complications: no    Last Vitals:  Vitals:   09/11/17 1130 09/11/17 1217  BP:  (!) 114/53  Pulse: 87 88  Resp: (!) 30 (!) 30  Temp:    SpO2: 91% 93%    Last Pain:  Vitals:   09/11/17 1217  TempSrc: Oral  PainSc:                  Lynda Rainwater

## 2017-09-11 NOTE — Progress Notes (Signed)
  Progress Note: General Surgery Service   Assessment/Plan: Patient Active Problem List   Diagnosis Date Noted  . Abdominal pain 09/10/2017  . Facial problem 08/06/2017  . CKD (chronic kidney disease), stage III 05/17/2017  . Transient alteration of awareness 05/17/2017  . Hypoglycemia due to insulin 05/17/2017  . Peripheral arterial disease (HCC) 11/04/2014  . Claudication (HCC) 09/24/2014  . Premature ventricular contractions (PVCs) (VPCs) 04/06/2014  . NSVT (nonsustained ventricular tachycardia) (HCC) 04/06/2014  . Diabetic peripheral neuropathy (HCC) 02/04/2014  . Dizziness 07/10/2013  . Diabetes (HCC) 06/13/2013  . 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 Screening for prostate cancer 05/12/2011  . Pure hypercholesterolemia 05/12/2011  . Encounter for long-term (current) use of other medications 05/12/2011  . 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 PROLIFERATIVE DIABETIC RETINOPATHY 05/05/2010  . Dyslipidemia 07/10/2007  . Anemia 07/10/2007  . Essential hypertension 07/10/2007  . Coronary atherosclerosis 07/10/2007  . Disorder resulting from impaired renal function 07/10/2007   s/p Procedure(s): ESOPHAGOGASTRODUODENOSCOPY (EGD) 09/11/2017 Diffuse abdominal pain, cholelithiasis, initial concern for liver mass, US showing no mass so likely contrast displacement issue, WBC increased today -f/u GI recs and EGD findings    LOS: 1 day  Chief Complaint/Subjective: Pain improved, min nausea  Objective: Vital signs in last 24 hours: Temp:  [99.4 F (37.4 C)-100 F (37.8 C)] 99.4 F (37.4 C) (09/18 0553) Pulse Rate:  [73-82] 80 (09/18 0553) Resp:  [16-25] 22 (09/18 0553) BP: (183-218)/(56-78) 189/61 (09/18 0553) SpO2:  [90 %-95 %] 92 % (09/18 0553) Weight:  [72.9 kg (160 lb 11.5 oz)] 72.9 kg (160 lb 11.5 oz) (09/17 1513) Last BM Date: 09/10/17  Intake/Output from previous day: 09/17 0701 - 09/18 0700 In: 668.8  [I.V.:668.8] Out: 75 [Urine:75] Intake/Output this shift: No intake/output data recorded.  Lungs: CTAB  Cardiovascular: RRR  Abd: soft, slight distension no tenderness or guarding  Extremities: no edema  Neuro: AOx4  Lab Results: CBC   Recent Labs  09/10/17 0420 09/10/17 0433 09/11/17 0511  WBC 12.3*  --  16.7*  HGB 11.1* 11.2* 10.3*  HCT 32.2* 33.0* 30.9*  PLT 267  --  245   BMET  Recent Labs  09/10/17 0420 09/10/17 0433 09/11/17 0511  NA 138 142 136  K 3.7 3.7 3.8  CL 106 107 105  CO2 22  --  22  GLUCOSE 94 89 200*  BUN 34* 33* 37*  CREATININE 1.51* 1.60* 1.54*  CALCIUM 9.4  --  8.2*   PT/INR No results for input(s): LABPROT, INR in the last 72 hours. ABG No results for input(s): PHART, HCO3 in the last 72 hours.  Invalid input(s): PCO2, PO2  Studies/Results:  Anti-infectives: Anti-infectives    None      Medications: Scheduled Meds: . aspirin EC  81 mg Oral Daily  . gabapentin  100 mg Oral BID  . heparin  5,000 Units Subcutaneous Q8H  . insulin aspart  0-9 Units Subcutaneous Q6H  . insulin glargine  10 Units Subcutaneous QHS  . isosorbide mononitrate  60 mg Oral Daily  . labetalol  200 mg Oral BID  . pantoprazole (PROTONIX) IV  40 mg Intravenous Q24H   Continuous Infusions: . sodium chloride 75 mL/hr at 09/10/17 2354   PRN Meds:.morphine injection, ondansetron (ZOFRAN) IV  Rodman Pickle, MD Pg# [phone] [facility]

## 2017-09-11 NOTE — Op Note (Signed)
[facility]: 09/11/2017 MRN: [id number] Attending MD: Wilhemina Bonito. Marina Goodell , MD Date of Birth: [birth year] CSN: [id number] Age: 76 Admit Type: Outpatient Procedure:                Upper GI endoscopy Indications:              Generalized abdominal pain. Pain resolved overnight Providers:                Wilhemina Bonito. Marina Goodell, MD, Norman Clay, RN, Margo Aye,                            Technician Referring MD:              Medicines:                Monitored Anesthesia Care Complications:            No immediate complications. Estimated Blood Loss:     Estimated blood loss: none. Procedure:                Pre-Anesthesia Assessment:                           - Prior to the procedure, a History and Physical                            was performed, and patient medications and                            allergies were reviewed. The patient's tolerance of                            previous anesthesia was also reviewed. The risks                            and benefits of the procedure and the sedation                            options and risks were discussed with the patient.                            All questions were answered, and informed consent                            was obtained. Prior Anticoagulants: The patient has                            taken no previous anticoagulant or antiplatelet                            agents. ASA Grade Assessment: III - A patient with                            severe systemic disease. After reviewing the risks  and benefits, the patient was deemed in                            satisfactory condition to undergo the procedure.                           After obtaining informed consent, the endoscope was                            passed under direct vision. Throughout the                            procedure, the patient's blood pressure, pulse, and                             oxygen saturations were monitored continuously. The                            [id number] ([id number]) scope was introduced through the                            mouth, and advanced to the second part of duodenum.                            The upper GI endoscopy was accomplished without                            difficulty. The patient tolerated the procedure                            well. Scope In: Scope Out: Findings:      The esophagus was normal.      The stomach was normal.      The examined duodenum was normal.      The cardia and gastric fundus were normal on retroflexion. Impression:               - Normal EGD                           - Recurrent abdominal pain. Etiology uncertain. Has                            resolved. Moderate Sedation:      none Recommendation:           1. Advance diet and observe overnight                           2. Solid-phase gastric emptying scan in a.m.                           3. Should the patient develop recurrent pain,                            surgical consultation regarding possible  symptomatic cholelithiasis.                           Discussed with patient and wife. Procedure Code(s):        --- Professional ---                           [phone], Esophagogastroduodenoscopy, flexible,                            transoral; diagnostic, including collection of                            specimen(s) by brushing or washing, when performed                            (separate procedure) Diagnosis Code(s):        --- Professional ---                           R10.84, Generalized abdominal pain CPT copyright 2016 American Medical Association. All rights reserved. The codes documented in this report are preliminary and upon coder review may  be revised to meet current compliance requirements. Wilhemina Bonito. Marina Goodell, MD 09/11/2017 12:44:48 PM This report has been signed electronically. Number of Addenda: 0

## 2017-09-11 NOTE — Anesthesia Preprocedure Evaluation (Addendum)
Anesthesia Evaluation  Patient identified by MRN, date of birth, ID band Patient awake    Reviewed: Allergy & Precautions, NPO status , Patient's Chart, lab work & pertinent test results  Airway Mallampati: II  TM Distance: >3 FB Neck ROM: Full    Dental no notable dental hx.    Pulmonary neg pulmonary ROS, former smoker,    Pulmonary exam normal breath sounds clear to auscultation       Cardiovascular hypertension, Pt. on medications + CAD  negative cardio ROS Normal cardiovascular exam Rhythm:Regular Rate:Normal     Neuro/Psych negative neurological ROS  negative psych ROS   GI/Hepatic negative GI ROS, Neg liver ROS,   Endo/Other  negative endocrine ROSdiabetes, Type 2, Oral Hypoglycemic Agents  Renal/GU Renal InsufficiencyRenal diseasenegative Renal ROS  negative genitourinary   Musculoskeletal negative musculoskeletal ROS (+)   Abdominal   Peds negative pediatric ROS (+)  Hematology negative hematology ROS (+)   Anesthesia Other Findings   Reproductive/Obstetrics negative OB ROS                            Anesthesia Physical Anesthesia Plan  ASA: III  Anesthesia Plan: MAC   Post-op Pain Management:    Induction: Intravenous  PONV Risk Score and Plan: 1 and Ondansetron and Treatment may vary due to age or medical condition  Airway Management Planned: Nasal Cannula  Additional Equipment:   Intra-op Plan:   Post-operative Plan:   Informed Consent: I have reviewed the patients History and Physical, chart, labs and discussed the procedure including the risks, benefits and alternatives for the proposed anesthesia with the patient or authorized representative who has indicated his/her understanding and acceptance.   Dental advisory given  Plan Discussed with: CRNA  Anesthesia Plan Comments:         Anesthesia Quick Evaluation

## 2017-09-11 NOTE — Interval H&P Note (Signed)
History and Physical Interval Note:  09/11/2017 11:51 AM  Henry [name].  has presented today for surgery, with the diagnosis of Nausea, vomiting. Abdominal pain .  Insulin requiring diabetic  The various methods of treatment have been discussed with the patient and family. After consideration of risks, benefits and other options for treatment, the patient has consented to  Procedure(s): ESOPHAGOGASTRODUODENOSCOPY (EGD) (N/A) as a surgical intervention .  The patient's history has been reviewed, patient examined, no change in status, stable for surgery.  I have reviewed the patient's chart and labs.  Questions were answered to the patient's satisfaction.     Yancey Flemings

## 2017-09-11 NOTE — Anesthesia Procedure Notes (Signed)
Date/Time: 09/11/2017 12:07 PM Performed by: Ludwig Lean Pre-anesthesia Checklist: Patient identified, Emergency Drugs available, Suction available and Patient being monitored Patient Re-evaluated:Patient Re-evaluated prior to induction Oxygen Delivery Method: Simple face mask

## 2017-09-11 NOTE — Progress Notes (Signed)
PROGRESS NOTE    Henry [name].  ZOX:[id number] DOB: 1941-04-02 DOA: 09/10/2017 PCP: Henry Belling, MD    Brief Narrative: Pt is a76 yo male with hx of  CAD s/p CABG and stent placements, HTN, DMII who came with cc of severe lower abdominal pain, started 1 am this morning, woke him up, non radiating, associated with nausea and vomiting w/o constipation or diarrhea, constant, w/o fever/chills. He had similar episode once last week that resolved on its own. No other complaints.     Assessment & Plan:   Active Problems:   Abdominal pain  1-Abdominal pain, nausea, vomiting.  IV fluids.  Endoscopy ; negative.  Check KUB.  Lipase normal.  CT angio; No evidence of aneurysm or dissection of the thoracic or abdominal aorta. Extensive aortic and major vessel calcifications. Visualized vessels appear patent but areas of nonocclusive stenosis are not excluded. Aortic atherosclerosis. Cholelithiasis without evidence of cholecystitis.  Area of arterial phase enhancement in segments 4-5 of the liver likely represent transient hepatic attenuation difference. Consider ultrasound for further evaluation to exclude a focal mass lesion. [place]; no liver mass, cholelithiasis.   2-Leukocytosis;  Will add cipro.  Check UA, urine culture,. Blood culture.   3- CKD, cr baseline 1.5 IV fluids.   4-DM type one ; he is brittle Diabetic.  Will use lantus while in patient. SSI.   5-HTN; continue with labetalol, Imdur.   Hx of CAD s/p CABG/stents: no chest pain, initial EKG/trop unremarkable, will continue asp, pt is intolerant of statins. Continue BB.  DVT prophylaxis: Heparin  Code Status: full code.  Family Communication:  Care discussed with wife Disposition Plan: home at time of discharge.   Consultants:   GI   Surgery    Procedures:   Endoscopy normal.    Antimicrobials: cipro    Subjective: He is feeling better. His abdomen is distended, but it was more distended yesterday.  He has  not pass gas. Had medium BM before he came to hospital/   Objective: Vitals:   09/11/17 1220 09/11/17 1230 09/11/17 1240 09/11/17 1301  BP: (!) 136/48 (!) 143/44 (!) 150/46 (!) 131/46  Pulse: 92 93 92 86  Resp: (!) Temp:    (!) 100.5 F (38.1 C)  TempSrc:    Oral  SpO2: 91% 94% 91% 95%  Weight:      Height:        Intake/Output Summary (Last 24 hours) at 09/11/17 1357 Last data filed at 09/11/17 1217  Gross per 24 hour  Intake          1068.75 ml  Output              375 ml  Net           693.75 ml   Filed Weights   09/10/17 0406 09/10/17 1513 09/11/17 1120  Weight: 74.8 kg (165 lb) 72.9 kg (160 lb 11.5 oz) 72.6 kg (160 lb)    Examination:  General exam: Appears calm and comfortable  Respiratory system: Clear to auscultation. Respiratory effort normal. Cardiovascular system: S1 & S2 heard, RRR. No JVD, murmurs, rubs, gallops or clicks. No pedal edema. Gastrointestinal system: Abdomen is distended, soft and nontender. No organomegaly or masses felt. Normal bowel sounds heard. Central nervous system: Alert and oriented. No focal neurological deficits. Extremities: Symmetric 5 x 5 power. Skin: No rashes, lesions or ulcers Psychiatry: Judgement and insight appear normal. Mood & affect appropriate.     Data  Reviewed: I have personally reviewed following labs and imaging studies  CBC:  Recent Labs Lab 09/10/17 0420 09/10/17 0433 09/11/17 0511  WBC 12.3*  --  16.7*  HGB 11.1* 11.2* 10.3*  HCT 32.2* 33.0* 30.9*  MCV 86.8  --  88.0  PLT 267  --  245   Basic Metabolic Panel:  Recent Labs Lab 09/10/17 0420 09/10/17 0433 09/11/17 0511  NA 138 142 136  K 3.7 3.7 3.8  CL 106 107 105  CO2 22  --  22  GLUCOSE 94 89 200*  BUN 34* 33* 37*  CREATININE 1.51* 1.60* 1.54*  CALCIUM 9.4  --  8.2*   GFR: Estimated Creatinine Clearance: 42.6 mL/min (A) (by C-G formula based on SCr of 1.54 mg/dL (H)). Liver Function Tests:  Recent Labs Lab 09/10/17 0420  09/11/17 0511  AST 24 27  ALT 19 18  ALKPHOS 80 79  BILITOT 0.8 0.9  PROT 7.3 6.3*  ALBUMIN 3.7 3.1*    Recent Labs Lab 09/10/17 0420  LIPASE 19   No results for input(s): AMMONIA in the last 168 hours. Coagulation Profile: No results for input(s): INR, PROTIME in the last 168 hours. Cardiac Enzymes: No results for input(s): CKTOTAL, CKMB, CKMBINDEX, TROPONINI in the last 168 hours. BNP (last 3 results) No results for input(s): PROBNP in the last 8760 hours. HbA1C: No results for input(s): HGBA1C in the last 72 hours. CBG:  Recent Labs Lab 09/10/17 2224 09/11/17 0030 09/11/17 0551 09/11/17 0746 09/11/17 1148  GLUCAP 208* 194* 219* 221* 340*   Lipid Profile: No results for input(s): CHOL, HDL, LDLCALC, TRIG, CHOLHDL, LDLDIRECT in the last 72 hours. Thyroid Function Tests: No results for input(s): TSH, T4TOTAL, FREET4, T3FREE, THYROIDAB in the last 72 hours. Anemia Panel: No results for input(s): VITAMINB12, FOLATE, FERRITIN, TIBC, IRON, RETICCTPCT in the last 72 hours. Sepsis Labs:  Recent Labs Lab 09/10/17 0709  LATICACIDVEN 1.45    No results found for this or any previous visit (from the past 240 hour(s)).       Radiology Studies: US Abdomen Limited  Result Date: 09/10/2017 CLINICAL DATA:  76 year old male with abdominal pain. Follow-up questionable liver lesion on recent CT. Subsequent encounter. EXAM: ULTRASOUND ABDOMEN LIMITED RIGHT UPPER QUADRANT COMPARISON:  09/10/2017 CT. FINDINGS: Gallbladder: Gallstones measuring up to 1 cm. Minimal sludge. No gallbladder wall thickening. Patient not tender over the gallbladder during scanning per ultrasound technologist. Common bile duct: Diameter: 4.3 mm proximally. Mid to distal aspect not visualized secondary to bowel gas. Liver: No focal lesion identified. Within normal limits in parenchymal echogenicity. Portal vein is patent on color Doppler imaging with normal direction of blood flow towards the liver. Trace  right pleural effusion. IMPRESSION: No liver mass is identified as questioned on recent CT. This suggests that CT finding is related to transient hepatic attenuation difference. Gallstones with minimal gallbladder sludge. No ultrasound evidence of gallbladder inflammation. Trace right pleural effusion. Electronically Signed   By: Lacy Duverney M.D.   On: 09/10/2017 10:48   Ct Angio Chest/abd/pel For Dissection W And/or W/wo  Result Date: 09/10/2017 CLINICAL DATA:  Patient woke up with severe abdominal pain and nausea. History of renal insufficiency, hypertension, diabetes. EXAM: CT ANGIOGRAPHY CHEST, ABDOMEN AND PELVIS TECHNIQUE: Multidetector CT imaging through the chest, abdomen and pelvis was performed using the standard protocol during bolus administration of intravenous contrast. Multiplanar reconstructed images and MIPs were obtained and reviewed to evaluate the vascular anatomy. CONTRAST:  75 mL Isovue 350 COMPARISON:  None.  FINDINGS: CTA CHEST FINDINGS Cardiovascular: Noncontrast images of the chest demonstrate postoperative changes consistent with previous sternotomy and coronary bypass grafts. Aortic calcification. No evidence of intramural hematoma. Calcified granulomas in the spleen. Images obtained during arterial phase after intravenous contrast material was administered demonstrate normal caliber patent thoracic aorta and great vessel origins. No evidence of aortic aneurysm or dissection. Normal heart size. No pericardial effusion. Mediastinum/Nodes: No enlarged mediastinal, hilar, or axillary lymph nodes. Thyroid gland, trachea, and esophagus demonstrate no significant findings. Lungs/Pleura: Evaluation is limited due to motion artifact. Dependent atelectasis demonstrated in both lung bases. No pleural effusions. No pneumothorax. Airways are patent. Musculoskeletal: No chest wall abnormality. No acute or significant osseous findings. Review of the MIP images confirms the above findings. CTA  ABDOMEN AND PELVIS FINDINGS VASCULAR Aorta: Normal caliber abdominal aorta. Diffuse calcification throughout the abdominal aorta. No aneurysm or dissection. Celiac: Celiac axis is patent with calcification demonstrated at the origin of the celiac trunk. Can't exclude some degree of stenosis. SMA: Superior mesenteric artery is patent with calcification at the origin and proximal and midportion. Can't exclude some degree of calcification. Renals: Single renal arteries bilaterally are patent. Scattered calcification throughout the renal arteries. IMA: Inferior mesenteric artery is patent with dense calcification at the origin, likely resulting in stenosis. Inflow: Dense calcification of vessels is demonstrated although the vessels remain patent. Veins: No obvious venous abnormality within the limitations of this arterial phase study. Review of the MIP images confirms the above findings. NON-VASCULAR Hepatobiliary: Large area arterial phase enhancement in the junction of segments 4 and 5 of the liver measuring 6.2 cm diameter. This could represent an atypical hemangioma or more likely transient hepatic attenuation difference. Consider ultrasound for further evaluation to exclude a focal mass lesion. Cholelithiasis with several stones in the gallbladder. No gallbladder wall thickening or edema. No bile duct dilatation. Pancreas: Pancreas is atrophic. Spleen: Normal in size without focal abnormality. Adrenals/Urinary Tract: Adrenal glands are unremarkable. Kidneys are normal, without renal calculi, focal lesion, or hydronephrosis. Bladder is unremarkable. Stomach/Bowel: Stomach, small bowel, and colon are not abnormally distended. Stool fills the colon. No inflammatory changes. Appendix is surgically absent. Lymphatic: No significant lymphadenopathy. Reproductive: Prostate gland is enlarged, measuring 5.3 cm diameter. Other: No free air or free fluid in the abdomen. Periumbilical hernia containing fat. Musculoskeletal: No  acute or significant osseous findings. Review of the MIP images confirms the above findings. IMPRESSION: 1. No evidence of aneurysm or dissection of the thoracic or abdominal aorta. 2. Extensive aortic and major vessel calcifications. Visualized vessels appear patent but areas of nonocclusive stenosis are not excluded. Aortic atherosclerosis. 3. Cholelithiasis without evidence of cholecystitis. 4. Area of arterial phase enhancement in segments 4-5 of the liver likely represent transient hepatic attenuation difference. Consider ultrasound for further evaluation to exclude a focal mass lesion. 5. Prostate gland is enlarged. Electronically Signed   By: Burman Nieves M.D.   On: 09/10/2017 06:13        Scheduled Meds: . aspirin EC  81 mg Oral Daily  . gabapentin  100 mg Oral BID  . heparin  5,000 Units Subcutaneous Q8H  . insulin aspart  0-9 Units Subcutaneous Q6H  . insulin glargine  7 Units Subcutaneous QHS  . isosorbide mononitrate  60 mg Oral Daily  . labetalol  200 mg Oral BID  . pantoprazole (PROTONIX) IV  40 mg Intravenous Q24H   Continuous Infusions: . sodium chloride 75 mL/hr at 09/10/17 2354  . ciprofloxacin  LOS: 1 day    Time spent: 35 minutes.     Alba Cory, MD [facility] Pager [phone]  If 7PM-7AM, please contact night-coverage [email or web address] Password [email or web address] 09/11/2017, 1:57 PM

## 2017-09-12 ENCOUNTER — Inpatient Hospital Stay (HOSPITAL_COMMUNITY): Payer: Medicare Other

## 2017-09-12 ENCOUNTER — Encounter: Payer: Self-pay | Admitting: *Deleted

## 2017-09-12 ENCOUNTER — Encounter (HOSPITAL_COMMUNITY): Payer: Self-pay | Admitting: Internal Medicine

## 2017-09-12 DIAGNOSIS — R932 Abnormal findings on diagnostic imaging of liver and biliary tract: Secondary | ICD-10-CM

## 2017-09-12 DIAGNOSIS — K5900 Constipation, unspecified: Secondary | ICD-10-CM

## 2017-09-12 DIAGNOSIS — K3184 Gastroparesis: Secondary | ICD-10-CM

## 2017-09-12 DIAGNOSIS — R1011 Right upper quadrant pain: Secondary | ICD-10-CM

## 2017-09-12 DIAGNOSIS — E1143 Type 2 diabetes mellitus with diabetic autonomic (poly)neuropathy: Secondary | ICD-10-CM

## 2017-09-12 DIAGNOSIS — K8020 Calculus of gallbladder without cholecystitis without obstruction: Secondary | ICD-10-CM

## 2017-09-12 DIAGNOSIS — R0902 Hypoxemia: Secondary | ICD-10-CM

## 2017-09-12 LAB — COMPREHENSIVE METABOLIC PANEL
ALT: 18 U/L (ref 17–63)
AST: 23 U/L (ref 15–41)
Albumin: 2.9 g/dL — ABNORMAL LOW (ref 3.5–5.0)
Alkaline Phosphatase: 73 U/L (ref 38–126)
Anion gap: 8 (ref 5–15)
BILIRUBIN TOTAL: 0.8 mg/dL (ref 0.3–1.2)
BUN: 43 mg/dL — AB (ref 6–20)
CHLORIDE: 105 mmol/L (ref 101–111)
CO2: 21 mmol/L — ABNORMAL LOW (ref 22–32)
CREATININE: 1.73 mg/dL — AB (ref 0.61–1.24)
Calcium: 7.9 mg/dL — ABNORMAL LOW (ref 8.9–10.3)
GFR calc Af Amer: 43 mL/min — ABNORMAL LOW (ref >60)
GFR, EST NON AFRICAN AMERICAN: 37 mL/min — AB (ref >60)
Glucose, Bld: 341 mg/dL — ABNORMAL HIGH (ref 65–99)
Potassium: 3.8 mmol/L (ref 3.5–5.1)
Sodium: 134 mmol/L — ABNORMAL LOW (ref 135–145)
Total Protein: 5.8 g/dL — ABNORMAL LOW (ref 6.5–8.1)

## 2017-09-12 LAB — GLUCOSE, CAPILLARY
GLUCOSE-CAPILLARY: 299 mg/dL — AB (ref 65–99)
GLUCOSE-CAPILLARY: 359 mg/dL — AB (ref 65–99)
GLUCOSE-CAPILLARY: 354 mg/dL — AB (ref 65–99)
Glucose-Capillary: 324 mg/dL — ABNORMAL HIGH (ref 65–99)
Glucose-Capillary: 359 mg/dL — ABNORMAL HIGH (ref 65–99)
Glucose-Capillary: 384 mg/dL — ABNORMAL HIGH (ref 65–99)

## 2017-09-12 LAB — URINE CULTURE

## 2017-09-12 LAB — CBC
HEMATOCRIT: 27.8 % — AB (ref 39.0–52.0)
HEMOGLOBIN: 9.4 g/dL — AB (ref 13.0–17.0)
MCH: 30.3 pg (ref 26.0–34.0)
MCHC: 33.8 g/dL (ref 30.0–36.0)
MCV: 89.7 fL (ref 78.0–100.0)
PLATELETS: 229 10*3/uL (ref 150–400)
RBC: 3.1 MIL/uL — AB (ref 4.22–5.81)
RDW: 13.7 % (ref 11.5–15.5)
WBC: 14.4 10*3/uL — AB (ref 4.0–10.5)

## 2017-09-12 MED ORDER — FUROSEMIDE 10 MG/ML IJ SOLN
40.0000 mg | Freq: Two times a day (BID) | INTRAMUSCULAR | Status: AC
  Administered 2017-09-12: 40 mg via INTRAVENOUS
  Filled 2017-09-12: qty 4

## 2017-09-12 MED ORDER — INSULIN ASPART 100 UNIT/ML ~~LOC~~ SOLN
0.0000 [IU] | Freq: Three times a day (TID) | SUBCUTANEOUS | Status: DC
Start: 2017-09-12 — End: 2017-09-15
  Administered 2017-09-12: 15 [IU] via SUBCUTANEOUS
  Administered 2017-09-13: 3 [IU] via SUBCUTANEOUS
  Administered 2017-09-13: 5 [IU] via SUBCUTANEOUS
  Administered 2017-09-14 (×2): 11 [IU] via SUBCUTANEOUS
  Administered 2017-09-14: 8 [IU] via SUBCUTANEOUS
  Administered 2017-09-15: 3 [IU] via SUBCUTANEOUS

## 2017-09-12 MED ORDER — TECHNETIUM TC 99M SULFUR COLLOID
2.1000 | Freq: Once | INTRAVENOUS | Status: AC | PRN
  Administered 2017-09-12: 2.1 via INTRAVENOUS

## 2017-09-12 MED ORDER — FUROSEMIDE 10 MG/ML IJ SOLN: 40.0000 mg | Freq: Two times a day (BID) | INTRAMUSCULAR | Status: DC

## 2017-09-12 MED ORDER — INSULIN ASPART 100 UNIT/ML ~~LOC~~ SOLN
0.0000 [IU] | Freq: Every day | SUBCUTANEOUS | Status: DC
  Administered 2017-09-12: 5 [IU] via SUBCUTANEOUS
  Administered 2017-09-13: 4 [IU] via SUBCUTANEOUS
  Administered 2017-09-14: 3 [IU] via SUBCUTANEOUS

## 2017-09-12 MED ORDER — BACITRACIN-NEOMYCIN-POLYMYXIN OINTMENT TUBE
TOPICAL_OINTMENT | Freq: Every day | CUTANEOUS | Status: DC
  Administered 2017-09-12 – 2017-09-15 (×3): via TOPICAL
  Filled 2017-09-12: qty 14.17

## 2017-09-12 MED ORDER — INSULIN GLARGINE 100 UNIT/ML ~~LOC~~ SOLN
12.0000 [IU] | Freq: Every day | SUBCUTANEOUS | Status: DC
  Administered 2017-09-12 – 2017-09-13 (×2): 12 [IU] via SUBCUTANEOUS
  Filled 2017-09-12 (×3): qty 0.12

## 2017-09-12 MED ORDER — MAGIC MOUTHWASH
5.0000 mL | Freq: Three times a day (TID) | ORAL | Status: DC | PRN
  Filled 2017-09-12: qty 5

## 2017-09-12 MED ORDER — INSULIN ASPART 100 UNIT/ML ~~LOC~~ SOLN
3.0000 [IU] | Freq: Three times a day (TID) | SUBCUTANEOUS | Status: DC
  Administered 2017-09-12 – 2017-09-15 (×5): 3 [IU] via SUBCUTANEOUS

## 2017-09-12 NOTE — Progress Notes (Signed)
Inpatient Diabetes Program Recommendations  AACE/ADA: New Consensus Statement on Inpatient Glycemic Control (2015)  Target Ranges:  Prepandial:   less than 140 mg/dL      Peak postprandial:   less than 180 mg/dL (1-2 hours)      Critically ill patients:  140 - 180 mg/dL   Lab Results  Component Value Date   GLUCAP 324 (H) 09/12/2017   HGBA1C 10.2 08/06/2017    Review of Glycemic Control  Diabetes history: DM1 Outpatient Diabetes medications: 70/30 42 units in am and 25 units in pm Current orders for Inpatient glycemic control: Lantus 7 units QHS, Novolog 0-9 units Q6H  Home dose of 70/30 is approx 48 units of long-acting and 20 units of short-acting insulin.  Inpatient Diabetes Program Recommendations:   Increase Lantus to 20 units Q24H Add meal coverage insulin when pt is eating at least 50% meals - 6 units tidwc  Will speak with pt regarding his HgbA1C results.  Continue to follow. Thank you. Ailene Ards, RD, LDN, CDE Inpatient Diabetes Coordinator [phone]

## 2017-09-12 NOTE — Congregational Nurse Program (Signed)
Congregational Nurse Program Note  Date of Encounter: 09/12/2017  Past Medical History: Past Medical History:  Diagnosis Date  . ANEMIA-NOS 07/10/2007  . CORONARY ARTERY DISEASE 07/10/2007  . DIABETES MELLITUS, TYPE I 07/10/2007  . HYPERLIPIDEMIA 07/10/2007  . HYPERTENSION 07/10/2007  . Peripheral arterial disease (HCC)   . 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 RENAL INSUFFICIENCY 07/10/2007    Encounter Details:     CNP Questionnaire - 09/12/17 1531      Patient Demographics   Is this a new or existing patient? Existing   Patient is considered a/an Not Applicable   Race Caucasian/White     Patient Assistance   Location of Patient Assistance Not Applicable   Patient's financial/insurance status Medicare   Uninsured Patient ([place] [employer]/Care Connects) No   Patient referred to apply for the following financial assistance Not Applicable   Food insecurities addressed Not Applicable   Transportation assistance No   Assistance securing medications No   Educational health offerings Not Applicable     Encounter Details   Primary purpose of visit Acute Illness/Condition Visit   Was an Emergency Department visit averted? Not Applicable   Does patient have a medical provider? Yes   Patient referred to Not Applicable   Was a mental health screening completed? (GAINS tool) No   Does patient have dental issues? No   Does patient have vision issues? No   Does your patient have an abnormal blood pressure today? No   Since previous encounter, have you referred patient for abnormal blood pressure that resulted in a new diagnosis or medication change? No   Does your patient have an abnormal blood glucose today? No   Since previous encounter, have you referred patient for abnormal blood glucose that resulted in a new diagnosis or medication change? No   Was there a life-saving intervention made? No

## 2017-09-12 NOTE — Progress Notes (Signed)
1 Day Post-Op    CC: abdominal pain, nausea and vomiting  Subjective: Patient is back from gastric emptying study. He is up in the chair but appears to be distended, he says this is better than when he came in. He does have some tenderness in the right upper quadrant.  Objective: Vital signs in last 24 hours: Temp:  [99.3 F (37.4 C)-100.5 F (38.1 C)] 99.3 F (37.4 C) (09/19 0510) Pulse Rate:  [75-93] 75 (09/19 0510) Resp:  [15-30] 20 (09/19 0510) BP: (114-194)/(40-53) 161/50 (09/19 0510) SpO2:  [87 %-97 %] 97 % (09/19 0510) Weight:  [72.6 kg (160 lb)] 72.6 kg (160 lb) (09/18 1120) Last BM Date: 09/10/17 480 PO 1600 IV Urine 1000 NO BM recorded Tm 100,VSS Creatinine trending up, glucose remain in the 300 range, LFT's are normal WBC still up, H/H down some. Gastric emptying study:12.1% emptied at 1 hr ( normal >= 10%) 24.3% emptied at 2 hr ( normal >= 40%) 27.6% emptied at 3 hr ( normal >= 70%) 26.8% emptied at 4 hr ( normal >= 90%) This is read as delayed emptying study, with Dr. Lamar Sprinkles opinion it was not unexpected.  Intake/Output from previous day: 09/18 0701 - 09/19 0700 In: 2080 [P.O.:480; I.V.:1600] Out: 1000 [Urine:1000] Intake/Output this shift: No intake/output data recorded.  General appearance: alert, cooperative and no distress Resp: clear to auscultation bilaterally GI: distended, some tenderness RUQ.  + BS  Lab Results:   Recent Labs  09/11/17 0511 09/12/17 0455  WBC 16.7* 14.4*  HGB 10.3* 9.4*  HCT 30.9* 27.8*  PLT 245 229    BMET  Recent Labs  09/11/17 0511 09/12/17 0455  NA 136 134*  K 3.8 3.8  CL 105 105  CO2 22 21*  GLUCOSE 200* 341*  BUN 37* 43*  CREATININE 1.54* 1.73*  CALCIUM 8.2* 7.9*   PT/INR No results for input(s): LABPROT, INR in the last 72 hours.   Recent Labs Lab 09/10/17 0420 09/11/17 0511 09/12/17 0455  AST ALT ALKPHOS 80 79 73  BILITOT 0.8 0.9 0.8  PROT 7.3 6.3* 5.8*  ALBUMIN 3.7  3.1* 2.9*     Lipase     Component Value Date/Time   LIPASE 19 09/10/2017 0420     Medications: . aspirin EC  81 mg Oral Daily  . gabapentin  100 mg Oral BID  . heparin  5,000 Units Subcutaneous Q8H  . insulin aspart  0-9 Units Subcutaneous Q6H  . insulin glargine  7 Units Subcutaneous QHS  . isosorbide mononitrate  60 mg Oral Daily  . labetalol  200 mg Oral BID  . pantoprazole (PROTONIX) IV  40 mg Intravenous Q24H   . sodium chloride 100 mL/hr at 09/12/17 0055  . ciprofloxacin Stopped (09/12/17 0156)   Anti-infectives    Start     Dose/Rate Route Frequency Ordered Stop   09/11/17 1200  ciprofloxacin (CIPRO) IVPB 400 mg     400 mg 200 mL/hr over 60 Minutes Intravenous Every 12 hours 09/11/17 1033        Assessment/Plan Abdominal pain Cholelithiasis/abnormal liver on CT, no mass on ultrasound EGD:  Normal esophagus, stomach, duodenum, gastric cardia and fundus 09/11/17 Chronic kidney disease Typ 1 Diabetes  Hypertension FEN: IV fluids/carb mod ID:  Cipro 9/18 =>> day 2 DVT:  SCD    Plan:  He seems to be allot more distended than I would expect from just symptomatic cholelithiasis.  Gastric emptying study appears  positive to me.  Will discuss with Dr. Sheliah Hatch.     LOS: 2 days    Meridee Branum 09/12/2017 [phone]

## 2017-09-12 NOTE — Congregational Nurse Program (Signed)
[id number] seen at [facility] long due to recent breathing problems.  Under md care and on 02.  No needs at this time.  Wife in room and is handling situation well.  No needs present.  Encouragement offered./Ryaan Vanwagoner,BSN,RN3,CCM,CN.

## 2017-09-12 NOTE — Care Management Note (Signed)
Case Management Note  Patient Details  Name: Henry [name]. MRN: [id number] Date of Birth: [birth year]  Subjective/Objective:75 y/o m admitted w/abdominal pain. From home. GI/surgery following. Endo-cholelithiasis. Gastric emptying study ordered.                    Action/Plan:d/c plan home.   Expected Discharge Date:   (unknown)               Expected Discharge Plan:  Home/Self Care  In-House Referral:     Discharge planning Services  CM Consult  Post Acute Care Choice:    Choice offered to:     DME Arranged:    DME Agency:     HH Arranged:    HH Agency:     Status of Service:  In process, will continue to follow  If discussed at Long Length of Stay Meetings, dates discussed:    Additional Comments:  Lanier Clam, RN 09/12/2017, 11:32 AM

## 2017-09-12 NOTE — Progress Notes (Addendum)
PROGRESS NOTE    Henry [name].  WUJ:[id number] DOB: 02-14-1941 DOA: 09/10/2017 PCP: Romero Belling, MD    Brief Narrative: Pt is a42 yo male with hx of  CAD s/p CABG and stent placements, HTN, DMII who came with cc of severe lower abdominal pain, started 1 am 9/17  morning, woke him up, non radiating, associated with nausea and vomiting w/o constipation or diarrhea, constant, w/o fever/chills. He had similar episode once last week that resolved on its own.  Assessment & Plan:  1-Acute Abdominal pain, nausea, vomiting.  -I wonder if this is related to symptomatic cholelithiases/Gb sludge -symptoms not typical for gastroparesis , gastric emptying scan pending today -[place] with gallstones and sludge, ? HIDA will d/w CCS -EGD normal -CT with transient hepatic attenuation difference in the liver, [place] normal -Stop CIpro  2- CKD, cr baseline 1.5 -with mild hypoxia and small pleural effusions, will hold IVF and give lasix x1 now  3-DM type one ; he is brittle Diabetic.  -increase lantus, add novolog with meals, SSI  4. HTN;  -continue labetalol, Imdur.    5. Hx of CAD s/p CABG/stents: -no chest pain, initial EKG/trop unremarkable, will continue asp, pt is intolerant of statins. Continue BB.  DVT prophylaxis: Heparin  Code Status: full code.  Family Communication:  No family at bedside Disposition Plan: home when improved  Consultants:   GI   Surgery    Procedures:   Endoscopy normal.    Antimicrobials: cipro    Subjective: -abd pain better, no vomiting today, awaiting GES scan  Objective: Vitals:   09/11/17 1301 09/11/17 2018 09/12/17 0100 09/12/17 0510  BP: (!) 131/46 (!) 144/40  (!) 161/50  Pulse: 86 80  75  Resp: Temp: (!) 100.5 F (38.1 C) 100 F (37.8 C) 99.6 F (37.6 C) 99.3 F (37.4 C)  TempSrc: Oral Oral Oral Oral  SpO2: 95% 93%  97%  Weight:      Height:        Intake/Output Summary (Last 24 hours) at 09/12/17 1328 Last data filed at  09/12/17 0700  Gross per 24 hour  Intake             1680 ml  Output              700 ml  Net              980 ml   Filed Weights   09/10/17 0406 09/10/17 1513 09/11/17 1120  Weight: 74.8 kg (165 lb) 72.9 kg (160 lb 11.5 oz) 72.6 kg (160 lb)    Examination:  General exam: Appears calm and comfortable, no distress Respiratory system: decreased BS at bases Cardiovascular system: S1 & S2 heard, RRR. No JVD, murmurs Gastrointestinal system: Abdomen is distended, soft and nontender.  Normal bowel sounds heard. Central nervous system: Alert and oriented. No focal neurological deficits. Extremities: Symmetric 5 x 5 power. Skin: No rashes, lesions or ulcers Psychiatry: Judgement and insight appear normal. Mood & affect appropriate.     Data Reviewed: I have personally reviewed following labs and imaging studies  CBC:  Recent Labs Lab 09/10/17 0420 09/10/17 0433 09/11/17 0511 09/12/17 0455  WBC 12.3*  --  16.7* 14.4*  HGB 11.1* 11.2* 10.3* 9.4*  HCT 32.2* 33.0* 30.9* 27.8*  MCV 86.8  --  88.0 89.7  PLT 267  --  245 229   Basic Metabolic Panel:  Recent Labs Lab 09/10/17 0420 09/10/17 0433 09/11/17 0511  09/12/17 0455  NA 138 142 136 134*  K 3.7 3.7 3.8 3.8  CL 106 107 105 105  CO2 22  --  22 21*  GLUCOSE 94 89 200* 341*  BUN 34* 33* 37* 43*  CREATININE 1.51* 1.60* 1.54* 1.73*  CALCIUM 9.4  --  8.2* 7.9*   GFR: Estimated Creatinine Clearance: 37.9 mL/min (A) (by C-G formula based on SCr of 1.73 mg/dL (H)). Liver Function Tests:  Recent Labs Lab 09/10/17 0420 09/11/17 0511 09/12/17 0455  AST ALT ALKPHOS 80 79 73  BILITOT 0.8 0.9 0.8  PROT 7.3 6.3* 5.8*  ALBUMIN 3.7 3.1* 2.9*    Recent Labs Lab 09/10/17 0420  LIPASE 19   No results for input(s): AMMONIA in the last 168 hours. Coagulation Profile: No results for input(s): INR, PROTIME in the last 168 hours. Cardiac Enzymes: No results for input(s): CKTOTAL, CKMB, CKMBINDEX,  TROPONINI in the last 168 hours. BNP (last 3 results) No results for input(s): PROBNP in the last 8760 hours. HbA1C: No results for input(s): HGBA1C in the last 72 hours. CBG:  Recent Labs Lab 09/11/17 1148 09/11/17 1656 09/12/17 0007 09/12/17 0634 09/12/17 0744  GLUCAP 340* 388* 384* 359* 324*   Lipid Profile: No results for input(s): CHOL, HDL, LDLCALC, TRIG, CHOLHDL, LDLDIRECT in the last 72 hours. Thyroid Function Tests: No results for input(s): TSH, T4TOTAL, FREET4, T3FREE, THYROIDAB in the last 72 hours. Anemia Panel: No results for input(s): VITAMINB12, FOLATE, FERRITIN, TIBC, IRON, RETICCTPCT in the last 72 hours. Sepsis Labs:  Recent Labs Lab 09/10/17 0709  LATICACIDVEN 1.45    Recent Results (from the past 240 hour(s))  Urine Culture     Status: Abnormal   Collection Time: 09/11/17  1:14 PM  Result Value Ref Range Status   Specimen Description URINE, RANDOM  Final   Special Requests NONE  Final   Culture MULTIPLE SPECIES PRESENT, SUGGEST RECOLLECTION (A)  Final   Report Status 09/12/2017 FINAL  Final         Radiology Studies: Dg Chest 2 View  Result Date: 09/12/2017 CLINICAL DATA:  Hypoxia, shortness of Breath EXAM: CHEST  2 VIEW COMPARISON:  02/04/2015 FINDINGS: Prior CABG. There is bibasilar atelectasis. Small bilateral effusions. Heart is normal size. No acute bony abnormality. IMPRESSION: Bibasilar atelectasis.  Small effusions. Electronically Signed   By: Charlett Nose M.D.   On: 09/12/2017 09:44   Dg Abd 1 View  Result Date: 09/11/2017 CLINICAL DATA:  hx of CAD s/p CABG and stent placements, HTN, DMII who came with cc of severe lower abdominal pain, started 1 am this morning, woke him up, non radiating, associated with nausea and vomiting w/o constipation or diarrhea, constant, without fever or chills. Similar episode last week. EXAM: ABDOMEN - 1 VIEW COMPARISON:  Abdominal ultrasound 09/10/2017. FINDINGS: Mild prominence of stool in the proximal  colon. Gallstones are present in the gallbladder, measuring up to 1.2 cm in long axis. Aortoiliac atherosclerotic vascular disease. Contrast medium is present in the urinary bladder. IMPRESSION: 1. Cholelithiasis. On yesterday's CT scan, the presence of the exaggerated hepatic parenchymal enhancement along the gallbladder fossa, a small cystic duct stone, and gallbladder wall thickening raise some concern for acute cholecystitis despite the documented lack of a sonographic Murphy's sign on ultrasound. In particular the cystic duct stone might be impacted. Hepatobiliary scan should be considered to rule out cholecystitis. 2. Mild prominence of stool in the ascending colon but otherwise unremarkable bowel gas  pattern. Electronically Signed   By: Gaylyn Rong M.D.   On: 09/11/2017 14:37        Scheduled Meds: . aspirin EC  81 mg Oral Daily  . gabapentin  100 mg Oral BID  . heparin  5,000 Units Subcutaneous Q8H  . insulin aspart  0-9 Units Subcutaneous Q6H  . insulin glargine  7 Units Subcutaneous QHS  . isosorbide mononitrate  60 mg Oral Daily  . labetalol  200 mg Oral BID  . pantoprazole (PROTONIX) IV  40 mg Intravenous Q24H   Continuous Infusions: . sodium chloride 10 mL/hr at 09/12/17 0800  . ciprofloxacin Stopped (09/12/17 0156)     LOS: 2 days    Time spent: 35 minutes.     Zannie Cove, MD [facility] Pager [phone]  If 7PM-7AM, please contact night-coverage [email or web address] Password [email or web address] 09/12/2017, 1:28 PM

## 2017-09-12 NOTE — Progress Notes (Signed)
[facility] Progress Note  CC:  Abdominal pain with nausea and vomiting  Subjective:  Patient just returned from GES.  Complaining of RUQ pain since he woke up this AM.  GES is abnormal, but doubt it should be causing this degree of RUQ tenderness.  Objective:  Vital signs in last 24 hours: Temp:  [99.3 F (37.4 C)-100.5 F (38.1 C)] 99.3 F (37.4 C) (09/19 0510) Pulse Rate:  [75-93] 75 (09/19 0510) Resp:  [15-30] 20 (09/19 0510) BP: (114-194)/(40-53) 161/50 (09/19 0510) SpO2:  [87 %-97 %] 97 % (09/19 0510) Weight:  [160 lb (72.6 kg)] 160 lb (72.6 kg) (09/18 1120) Last BM Date: 09/10/17 General:  Alert, Well-developed, in NAD Heart:  Regular rate and rhythm; no murmurs Pulm:  CTAB.  No increased WOB. Abdomen:  Soft, non-distended.  BS present.  moderate RUQ TTP.  Extremities:  Without edema. Neurologic:  Alert and oriented x 4;  grossly normal neurologically. Psych:  Alert and cooperative. Normal mood and affect.  Intake/Output from previous day: 09/18 0701 - 09/19 0700 In: 2080 [P.O.:480; I.V.:1600] Out: 1000 [Urine:1000]  Lab Results:  Recent Labs  09/10/17 0420 09/10/17 0433 09/11/17 0511 09/12/17 0455  WBC 12.3*  --  16.7* 14.4*  HGB 11.1* 11.2* 10.3* 9.4*  HCT 32.2* 33.0* 30.9* 27.8*  PLT 267  --  245 229   BMET  Recent Labs  09/10/17 0420 09/10/17 0433 09/11/17 0511 09/12/17 0455  NA 138 142 136 134*  K 3.7 3.7 3.8 3.8  CL 106 107 105 105  CO2 22  --  22 21*  GLUCOSE 94 89 200* 341*  BUN 34* 33* 37* 43*  CREATININE 1.51* 1.60* 1.54* 1.73*  CALCIUM 9.4  --  8.2* 7.9*   LFT  Recent Labs  09/12/17 0455  PROT 5.8*  ALBUMIN 2.9*  AST 23  ALT 18  ALKPHOS 73  BILITOT 0.8    Dg Chest 2 View  Result Date: 09/12/2017 CLINICAL DATA:  Hypoxia, shortness of Breath EXAM: CHEST  2 VIEW COMPARISON:  02/04/2015 FINDINGS: Prior CABG. There is bibasilar atelectasis. Small bilateral effusions. Heart is normal size. No acute bony  abnormality. IMPRESSION: Bibasilar atelectasis.  Small effusions. Electronically Signed   By: Charlett Nose M.D.   On: 09/12/2017 09:44   Dg Abd 1 View  Result Date: 09/11/2017 CLINICAL DATA:  hx of CAD s/p CABG and stent placements, HTN, DMII who came with cc of severe lower abdominal pain, started 1 am this morning, woke him up, non radiating, associated with nausea and vomiting w/o constipation or diarrhea, constant, without fever or chills. Similar episode last week. EXAM: ABDOMEN - 1 VIEW COMPARISON:  Abdominal ultrasound 09/10/2017. FINDINGS: Mild prominence of stool in the proximal colon. Gallstones are present in the gallbladder, measuring up to 1.2 cm in long axis. Aortoiliac atherosclerotic vascular disease. Contrast medium is present in the urinary bladder. IMPRESSION: 1. Cholelithiasis. On yesterday's CT scan, the presence of the exaggerated hepatic parenchymal enhancement along the gallbladder fossa, a small cystic duct stone, and gallbladder wall thickening raise some concern for acute cholecystitis despite the documented lack of a sonographic Murphy's sign on ultrasound. In particular the cystic duct stone might be impacted. Hepatobiliary scan should be considered to rule out cholecystitis. 2. Mild prominence of stool in the ascending colon but otherwise unremarkable bowel gas pattern. Electronically Signed   By: Gaylyn Rong M.D.   On: 09/11/2017 14:37   Assessment / Plan: *Acute onset diffuse abdominal pain,  nausea, and clear emesis.  No source of symptoms found at this point.  Does have gallstones but no evidence of cholecystitis.  Long history type 1 DM, with sugars increasingly difficult to control within the last month.  Raises question of diabetic gastroparesis.  However historically has little in the way of GI symptoms suggesting gastroparesis.  EGD normal 9/18.  GES shows delayed emptying, but doubt that this is the cause of his RUQ tenderness on exam today.  This is likely a  gallbladder issue.  Discussed with surgery and they will be seeing him later this afternoon.   LOS: 2 days   ZEHR, JESSICA D.  09/12/2017, 11:07 AM  Pager number [phone]  GI ATTENDING  Interval history and data reviewed. Nuclear medicine scan reviewed. Patient personally seen and examined. Wife in room. Patient has returned from nuclear medicine. 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 Upper endoscopy yesterday was unremarkable. No retained gastric contents. This afternoon he complains of moderately severe right upper quadrant tenderness. He is indeed tender on exam to palpation. This type of tenderness would be highly unusual for gastroparesis, particularly given an empty stomach on endoscopy yesterday. I am concerned that he may have had intermittent problems with abdominal pain secondary to biliary colic without acute cholecystitis. Agree with surgical opinion as his extensive workup, otherwise, has been unyielding. We'll follow. Discussed with patient and wife  Wilhemina Bonito. Eda Keys., M.D. [facility] Division of Gastroenterology

## 2017-09-13 ENCOUNTER — Encounter (HOSPITAL_COMMUNITY)
Admission: EM | Disposition: A | Discharge status: Home / Self Care | Payer: Self-pay | Source: Home / Self Care | Attending: Internal Medicine

## 2017-09-13 ENCOUNTER — Encounter (HOSPITAL_COMMUNITY): Payer: Self-pay

## 2017-09-13 ENCOUNTER — Inpatient Hospital Stay (HOSPITAL_COMMUNITY): Payer: Medicare Other | Admitting: Anesthesiology

## 2017-09-13 DIAGNOSIS — K5909 Other constipation: Secondary | ICD-10-CM

## 2017-09-13 HISTORY — PX: CHOLECYSTECTOMY: SHX55

## 2017-09-13 LAB — GLUCOSE, CAPILLARY
GLUCOSE-CAPILLARY: 209 mg/dL — AB (ref 65–99)
Glucose-Capillary: 159 mg/dL — ABNORMAL HIGH (ref 65–99)
Glucose-Capillary: 200 mg/dL — ABNORMAL HIGH (ref 65–99)
Glucose-Capillary: 226 mg/dL — ABNORMAL HIGH (ref 65–99)
Glucose-Capillary: 328 mg/dL — ABNORMAL HIGH (ref 65–99)

## 2017-09-13 LAB — SURGICAL PCR SCREEN
MRSA, PCR: NEGATIVE
STAPHYLOCOCCUS AUREUS: NEGATIVE

## 2017-09-13 SURGERY — LAPAROSCOPIC CHOLECYSTECTOMY WITH INTRAOPERATIVE CHOLANGIOGRAM
Anesthesia: General

## 2017-09-13 MED ORDER — OXYCODONE HCL 5 MG PO TABS: 5.0000 mg | ORAL_TABLET | Freq: Once | ORAL | Status: DC | PRN

## 2017-09-13 MED ORDER — FENTANYL CITRATE (PF) 100 MCG/2ML IJ SOLN
INTRAMUSCULAR | Status: AC
  Filled 2017-09-13: qty 2

## 2017-09-13 MED ORDER — ONDANSETRON HCL 4 MG/2ML IJ SOLN
INTRAMUSCULAR | Status: DC | PRN
  Administered 2017-09-13: 4 mg via INTRAVENOUS

## 2017-09-13 MED ORDER — FENTANYL CITRATE (PF) 100 MCG/2ML IJ SOLN
25.0000 ug | INTRAMUSCULAR | Status: DC | PRN
  Administered 2017-09-13 (×2): 50 ug via INTRAVENOUS

## 2017-09-13 MED ORDER — DEXAMETHASONE SODIUM PHOSPHATE 10 MG/ML IJ SOLN
INTRAMUSCULAR | Status: AC
  Filled 2017-09-13: qty 1

## 2017-09-13 MED ORDER — PROPOFOL 10 MG/ML IV BOLUS
INTRAVENOUS | Status: DC | PRN
  Administered 2017-09-13: 110 mg via INTRAVENOUS

## 2017-09-13 MED ORDER — OXYCODONE HCL 5 MG/5ML PO SOLN: 5.0000 mg | Freq: Once | ORAL | Status: DC | PRN

## 2017-09-13 MED ORDER — ROCURONIUM BROMIDE 10 MG/ML (PF) SYRINGE
PREFILLED_SYRINGE | INTRAVENOUS | Status: DC | PRN
  Administered 2017-09-13: 50 mg via INTRAVENOUS

## 2017-09-13 MED ORDER — BUPIVACAINE-EPINEPHRINE (PF) 0.25% -1:200000 IJ SOLN
INTRAMUSCULAR | Status: AC
  Filled 2017-09-13: qty 30

## 2017-09-13 MED ORDER — LACTATED RINGERS IV SOLN
INTRAVENOUS | Status: DC | PRN
  Administered 2017-09-13: 11:00:00 via INTRAVENOUS
  Administered 2017-09-13: 1000 mL

## 2017-09-13 MED ORDER — LIDOCAINE 2% (20 MG/ML) 5 ML SYRINGE
INTRAMUSCULAR | Status: AC
  Filled 2017-09-13: qty 5

## 2017-09-13 MED ORDER — ONDANSETRON HCL 4 MG/2ML IJ SOLN
INTRAMUSCULAR | Status: AC
  Filled 2017-09-13: qty 2

## 2017-09-13 MED ORDER — LABETALOL HCL 5 MG/ML IV SOLN
10.0000 mg | Freq: Once | INTRAVENOUS | Status: AC
  Administered 2017-09-13: 10 mg via INTRAVENOUS

## 2017-09-13 MED ORDER — SUGAMMADEX SODIUM 200 MG/2ML IV SOLN
INTRAVENOUS | Status: DC | PRN
  Administered 2017-09-13: 150 mg via INTRAVENOUS

## 2017-09-13 MED ORDER — SUGAMMADEX SODIUM 200 MG/2ML IV SOLN
INTRAVENOUS | Status: AC
  Filled 2017-09-13: qty 2

## 2017-09-13 MED ORDER — 0.9 % SODIUM CHLORIDE (POUR BTL) OPTIME
TOPICAL | Status: DC | PRN
  Administered 2017-09-13: 1000 mL

## 2017-09-13 MED ORDER — PROPOFOL 10 MG/ML IV BOLUS
INTRAVENOUS | Status: AC
  Filled 2017-09-13: qty 20

## 2017-09-13 MED ORDER — INSULIN ASPART 100 UNIT/ML ~~LOC~~ SOLN
SUBCUTANEOUS | Status: AC
  Filled 2017-09-13: qty 1

## 2017-09-13 MED ORDER — LIDOCAINE 2% (20 MG/ML) 5 ML SYRINGE
INTRAMUSCULAR | Status: DC | PRN
  Administered 2017-09-13: 60 mg via INTRAVENOUS

## 2017-09-13 MED ORDER — SUCCINYLCHOLINE CHLORIDE 200 MG/10ML IV SOSY
PREFILLED_SYRINGE | INTRAVENOUS | Status: AC
  Filled 2017-09-13: qty 10

## 2017-09-13 MED ORDER — FENTANYL CITRATE (PF) 100 MCG/2ML IJ SOLN
INTRAMUSCULAR | Status: DC | PRN
  Administered 2017-09-13 (×4): 50 ug via INTRAVENOUS

## 2017-09-13 MED ORDER — ONDANSETRON HCL 4 MG/2ML IJ SOLN: 4.0000 mg | Freq: Four times a day (QID) | INTRAMUSCULAR | Status: DC | PRN

## 2017-09-13 MED ORDER — BUPIVACAINE-EPINEPHRINE 0.25% -1:200000 IJ SOLN
INTRAMUSCULAR | Status: DC | PRN
  Administered 2017-09-13: 20 mL

## 2017-09-13 MED ORDER — ROCURONIUM BROMIDE 50 MG/5ML IV SOSY
PREFILLED_SYRINGE | INTRAVENOUS | Status: AC
  Filled 2017-09-13: qty 5

## 2017-09-13 MED ORDER — LABETALOL HCL 5 MG/ML IV SOLN
INTRAVENOUS | Status: AC
  Filled 2017-09-13: qty 4

## 2017-09-13 MED ORDER — FENTANYL CITRATE (PF) 250 MCG/5ML IJ SOLN
INTRAMUSCULAR | Status: AC
  Filled 2017-09-13: qty 5

## 2017-09-13 MED ORDER — IOPAMIDOL (ISOVUE-300) INJECTION 61%
INTRAVENOUS | Status: AC
Start: 2017-09-13 — End: 2017-09-13
  Filled 2017-09-13: qty 50

## 2017-09-13 MED ORDER — LACTATED RINGERS IR SOLN
Status: DC | PRN
  Administered 2017-09-13: 1000 mL

## 2017-09-13 MED ORDER — OXYCODONE HCL 5 MG PO TABS: 5.0000 mg | ORAL_TABLET | Freq: Four times a day (QID) | ORAL | Status: DC | PRN

## 2017-09-13 SURGICAL SUPPLY — 42 items
APPLIER CLIP ROT 10 11.4 M/L (STAPLE)
CABLE HIGH FREQUENCY MONO STRZ (ELECTRODE) ×3 IMPLANT
CATH CHOLANG 76X19 KUMAR (CATHETERS) IMPLANT
CHLORAPREP W/TINT 26ML (MISCELLANEOUS) ×3 IMPLANT
CLIP APPLIE ROT 10 11.4 M/L (STAPLE) IMPLANT
CLIP LIGATING HEMO O LOK GREEN (MISCELLANEOUS) ×6 IMPLANT
CLIP VESOLOCK LG 6/CT PURPLE (CLIP) IMPLANT
CLIP VESOLOCK XL 6/CT (CLIP) IMPLANT
COVER MAYO STAND STRL (DRAPES) IMPLANT
COVER SURGICAL LIGHT HANDLE (MISCELLANEOUS) ×3 IMPLANT
DECANTER SPIKE VIAL GLASS SM (MISCELLANEOUS) ×3 IMPLANT
DERMABOND ADVANCED (GAUZE/BANDAGES/DRESSINGS) ×2
DERMABOND ADVANCED .7 DNX12 (GAUZE/BANDAGES/DRESSINGS) ×1 IMPLANT
DRAIN CHANNEL 19F RND (DRAIN) ×3 IMPLANT
DRAPE C-ARM 42X120 X-RAY (DRAPES) IMPLANT
ELECT PENCIL ROCKER SW 15FT (MISCELLANEOUS) ×3 IMPLANT
EVACUATOR SILICONE 100CC (DRAIN) ×3 IMPLANT
GLOVE BIOGEL PI IND STRL 7.0 (GLOVE) ×1 IMPLANT
GLOVE BIOGEL PI INDICATOR 7.0 (GLOVE) ×2
GLOVE SURG SS PI 7.0 STRL IVOR (GLOVE) ×3 IMPLANT
GOWN STRL REUS W/TWL LRG LVL3 (GOWN DISPOSABLE) ×3 IMPLANT
GOWN STRL REUS W/TWL XL LVL3 (GOWN DISPOSABLE) ×6 IMPLANT
GRASPER SUT TROCAR 14GX15 (MISCELLANEOUS) ×3 IMPLANT
IRRIG SUCT STRYKERFLOW 2 WTIP (MISCELLANEOUS)
IRRIGATION SUCT STRKRFLW 2 WTP (MISCELLANEOUS) IMPLANT
KIT BASIN OR (CUSTOM PROCEDURE TRAY) ×3 IMPLANT
POUCH RETRIEVAL ECOSAC 10 (ENDOMECHANICALS) ×2 IMPLANT
POUCH RETRIEVAL ECOSAC 10MM (ENDOMECHANICALS) ×4
SCISSORS LAP 5X35 DISP (ENDOMECHANICALS) ×3 IMPLANT
SET IRRIG TUBING LAPAROSCOPIC (IRRIGATION / IRRIGATOR) ×3 IMPLANT
SHEARS HARMONIC ACE PLUS 36CM (ENDOMECHANICALS) IMPLANT
SLEEVE XCEL OPT CAN 5 100 (ENDOMECHANICALS) ×6 IMPLANT
STOPCOCK 4 WAY LG BORE MALE ST (IV SETS) IMPLANT
SUT ETHILON 2 0 PS N (SUTURE) ×3 IMPLANT
SUT MNCRL AB 4-0 PS2 18 (SUTURE) ×3 IMPLANT
SUT VICRYL 0 ENDOLOOP (SUTURE) IMPLANT
TOWEL OR 17X26 10 PK STRL BLUE (TOWEL DISPOSABLE) ×3 IMPLANT
TOWEL OR NON WOVEN STRL DISP B (DISPOSABLE) IMPLANT
TRAY LAPAROSCOPIC (CUSTOM PROCEDURE TRAY) ×3 IMPLANT
TROCAR BLADELESS OPT 5 100 (ENDOMECHANICALS) ×3 IMPLANT
TROCAR XCEL 12X100 BLDLESS (ENDOMECHANICALS) ×3 IMPLANT
TUBING INSUF HEATED (TUBING) ×3 IMPLANT

## 2017-09-13 NOTE — Op Note (Signed)
PATIENT:  Henry [name].  76 y.o. male  PRE-OPERATIVE DIAGNOSIS:  gallstones  POST-OPERATIVE DIAGNOSIS:  gallstones  PROCEDURE:  Procedure(s): LAPAROSCOPIC CHOLECYSTECTOMY   SURGEON:  Surgeon(s): Charlann Wayne, De Blanch, MD  ASSISTANT: none  ANESTHESIA:   local and general  Indications for procedure: [name] is a 76 y.o. male with symptoms of Abdominal pain and Nausea and vomiting consistent with gallbladder disease, Confirmed by Ultrasound.  Description of procedure: The patient was brought into the operative suite, placed supine. Anesthesia was administered with endotracheal tube. Patient was strapped in place and foot board was secured. All pressure points were offloaded by foam padding. The patient was prepped and draped in the usual sterile fashion.  An infraumbilical incision was made and blunt dissection used to encircle the umbilical hernia sac. The sac was dissected free of the underlying skin with cautery and then a 12mm trocar was placed through a hole in the hernia sac. Pneumoperitoneum was applied with high flow low pressure. 2 5mm trocars were placed in the RUQ. A 5mm trocar was placed in the subxiphoid space. All trocars sites were first anesthesized with 0.25% marcaine with epinephrine in the subcutaneous and preperitoneal layers. Next the patient was placed in reverse trendelenberg. The gallbladder was adherent to the omentum, these were taken down with blunt dissection to show a necrotic gallbladder.  The gallbladder was retracted cephalad and lateral. The peritoneum was reflected off the infundibulum working lateral to medial. The cystic duct and cystic artery were identified and further dissection revealed a critical view.  The cystic duct and cystic artery were doubly clipped and ligated.   The gallbladder was removed off the liver bed with cautery. The Gallbladder was placed in a specimen bag. The gallbladder fossa was irrigated and hemostasis was applied  with cautery. A 23fr blake was brought out the lateral port and the tip placed along the gallbladder fossa. The gallbladder was removed via the 12mm trocar. The umbilical hernia was closed with interrupted 0 vicryl suture. Pneumoperitoneum was removed, all trocar were removed. All incisions were closed with 4-0 monocryl subcuticular stitch. The patient woke from anesthesia and was brought to PACU in stable condition. All counts were correct  Findings: necrotic gallbladder, umbilical hernia  Specimen: gallbladder  Blood loss: Total I/O In: -  Out: 575 [Urine:525; Blood:50] ml  Local anesthesia: 20 ml 0.5% marcaine  Complications: none  PLAN OF CARE: Admit to inpatient   PATIENT DISPOSITION:  PACU - hemodynamically stable.  Feliciana Rossetti, M.D. General, Bariatric, & Minimally Invasive Surgery [facility]

## 2017-09-13 NOTE — Anesthesia Preprocedure Evaluation (Signed)
Anesthesia Evaluation  Patient identified by MRN, date of birth, ID band Patient awake    Reviewed: Allergy & Precautions, H&P , NPO status , Patient's Chart, lab work & pertinent test results  Airway Mallampati: II   Neck ROM: full    Dental   Pulmonary former smoker,    breath sounds clear to auscultation       Cardiovascular hypertension, + CAD, + CABG and + Peripheral Vascular Disease   Rhythm:regular Rate:Normal     Neuro/Psych  Neuromuscular disease    GI/Hepatic   Endo/Other  diabetes, Insulin Dependent  Renal/GU Renal InsufficiencyRenal disease     Musculoskeletal   Abdominal   Peds  Hematology  (+) anemia ,   Anesthesia Other Findings   Reproductive/Obstetrics                             Anesthesia Physical Anesthesia Plan  ASA: III  Anesthesia Plan: General   Post-op Pain Management:    Induction: Intravenous  PONV Risk Score and Plan: 3 and Ondansetron, Dexamethasone and Treatment may vary due to age or medical condition  Airway Management Planned: Oral ETT  Additional Equipment:   Intra-op Plan:   Post-operative Plan: Extubation in OR  Informed Consent: I have reviewed the patients History and Physical, chart, labs and discussed the procedure including the risks, benefits and alternatives for the proposed anesthesia with the patient or authorized representative who has indicated his/her understanding and acceptance.     Plan Discussed with: CRNA, Anesthesiologist and Surgeon  Anesthesia Plan Comments:         Anesthesia Quick Evaluation

## 2017-09-13 NOTE — Care Management Important Message (Signed)
Important Message  Patient Details  Name: Chanel Mckesson. MRN: [id number] Date of Birth: 03-17-1941   Medicare Important Message Given:  Yes    Caren Macadam 09/13/2017, 11:54 AMImportant Message  Patient Details  Name: Haward Pope. MRN: [id number] Date of Birth: [birth year]   Medicare Important Message Given:  Yes    Caren Macadam 09/13/2017, 11:54 AM

## 2017-09-13 NOTE — Anesthesia Procedure Notes (Signed)
Procedure Name: Intubation Date/Time: 09/13/2017 11:12 AM Performed by: Leroy Libman L Patient Re-evaluated:Patient Re-evaluated prior to induction Preoxygenation: Pre-oxygenation with 100% oxygen Induction Type: IV induction Ventilation: Mask ventilation without difficulty and Oral airway inserted - appropriate to patient size Laryngoscope Size: Miller and 3 Grade View: Grade III Tube type: Oral Tube size: 8.0 mm Number of attempts: 1 Airway Equipment and Method: Bougie stylet Placement Confirmation: ETT inserted through vocal cords under direct vision,  positive ETCO2 and breath sounds checked- equal and bilateral Secured at: 22 cm Tube secured with: Tape Dental Injury: Teeth and Oropharynx as per pre-operative assessment  Difficulty Due To: Difficulty was anticipated, Difficult Airway- due to anterior larynx and Difficult Airway- due to limited oral opening

## 2017-09-13 NOTE — Progress Notes (Signed)
Appreciate surgical help. Patient going for cholecystectomy this morning.  Still with RUQ abdominal pain.  Nothing new to add, for now, from a GI standpoint.  Hopefully surgery will resolve his recurrent acute abdominal pain. We will follow-up tomorrow.  Wilhemina Bonito. Eda Keys., M.D. [facility] Division of Gastroenterology

## 2017-09-13 NOTE — Progress Notes (Signed)
PROGRESS NOTE    Henry [name].  ZOX:[id number] DOB: Nov 28, 1941 DOA: 09/10/2017 PCP: Romero Belling, MD    Brief Narrative: Pt is a76 yo male with hx of  CAD s/p CABG and stent placements, HTN, DMII who came with cc of severe lower abdominal pain, started 1 am 9/17  morning, woke him up, non radiating, associated with nausea and vomiting w/o constipation or diarrhea, constant, w/o fever/chills. He had similar episode once last week that resolved on its own.  Assessment & Plan:  1-Acute Abdominal pain, nausea, vomiting.  -due to symptomatic cholelithiases/Gb sludge/biliary colic likely -symptoms not typical for gastroparesis despite delayed gastric emptying -EGD normal -CT with transient hepatic attenuation difference in the liver, [place] normal -plan for Lap chole today  2- CKD, cr baseline 1.5 -stable  3. Mild hypoxia  -iatrogenic from IVF administration, with small pleural effusions, -s/p lasix-now improved, wean O2  4-DM type one ; he is brittle Diabetic.  -increased lantus, add novolog with meals, SSI  5. HTN;  -continue labetalol, Imdur.    6. Hx of CAD s/p CABG/stents: -no chest pain, initial EKG/trop unremarkable, will continue asp, pt is intolerant of statins. Continue BB.  7. Gastroparesis -no symptoms, monitor -Rx DM better   DVT prophylaxis: Heparin  Code Status: full code.  Family Communication:  No family at bedside Disposition Plan: home when improved  Consultants:   GI   Surgery    Procedures:   Endoscopy normal.    Antimicrobials: cipro    Subjective: -mild R side abd pain, no N/V  Objective: Vitals:   09/13/17 1400 09/13/17 1415 09/13/17 1430 09/13/17 1445  BP: (!) 193/67 (!) 174/65 (!) 187/68 (!) 188/61  Pulse: 65 78 68 77  Resp: Temp:   99.4 F (37.4 C) 99.9 F (37.7 C)  TempSrc:      SpO2: 95% 95% 98% 94%  Weight:      Height:        Intake/Output Summary (Last 24 hours) at 09/13/17 1518 Last data filed at  09/13/17 1430  Gross per 24 hour  Intake             1540 ml  Output             1040 ml  Net              500 ml   Filed Weights   09/10/17 0406 09/10/17 1513 09/11/17 1120  Weight: 74.8 kg (165 lb) 72.9 kg (160 lb 11.5 oz) 72.6 kg (160 lb)    Examination:  Gen: Awake, Alert, Oriented X 3,  HEENT: PERRLA, Neck supple, no JVD Lungs: Good air movement bilaterally, CTAB CVS: RRR,No Gallops,Rubs or new Murmurs Abd: soft, tender RUQ, non distended, BS present Extremities: No Cyanosis, Clubbing or edema Skin: no new rashes    Data Reviewed: I have personally reviewed following labs and imaging studies  CBC:  Recent Labs Lab 09/10/17 0420 09/10/17 0433 09/11/17 0511 09/12/17 0455  WBC 12.3*  --  16.7* 14.4*  HGB 11.1* 11.2* 10.3* 9.4*  HCT 32.2* 33.0* 30.9* 27.8*  MCV 86.8  --  88.0 89.7  PLT 267  --  245 229   Basic Metabolic Panel:  Recent Labs Lab 09/10/17 0420 09/10/17 0433 09/11/17 0511 09/12/17 0455  NA 138 142 136 134*  K 3.7 3.7 3.8 3.8  CL 106 107 105 105  CO2 22  --  22 21*  GLUCOSE 94 89 200* 341*  BUN 34* 33* 37* 43*  CREATININE 1.51* 1.60* 1.54* 1.73*  CALCIUM 9.4  --  8.2* 7.9*   GFR: Estimated Creatinine Clearance: 37.9 mL/min (A) (by C-G formula based on SCr of 1.73 mg/dL (H)). Liver Function Tests:  Recent Labs Lab 09/10/17 0420 09/11/17 0511 09/12/17 0455  AST ALT ALKPHOS 80 79 73  BILITOT 0.8 0.9 0.8  PROT 7.3 6.3* 5.8*  ALBUMIN 3.7 3.1* 2.9*    Recent Labs Lab 09/10/17 0420  LIPASE 19   No results for input(s): AMMONIA in the last 168 hours. Coagulation Profile: No results for input(s): INR, PROTIME in the last 168 hours. Cardiac Enzymes: No results for input(s): CKTOTAL, CKMB, CKMBINDEX, TROPONINI in the last 168 hours. BNP (last 3 results) No results for input(s): PROBNP in the last 8760 hours. HbA1C: No results for input(s): HGBA1C in the last 72 hours. CBG:  Recent Labs Lab 09/12/17 1650  09/12/17 2102 09/13/17 0731 09/13/17 1004 09/13/17 1259  GLUCAP 359* 354* 159* 209* 200*   Lipid Profile: No results for input(s): CHOL, HDL, LDLCALC, TRIG, CHOLHDL, LDLDIRECT in the last 72 hours. Thyroid Function Tests: No results for input(s): TSH, T4TOTAL, FREET4, T3FREE, THYROIDAB in the last 72 hours. Anemia Panel: No results for input(s): VITAMINB12, FOLATE, FERRITIN, TIBC, IRON, RETICCTPCT in the last 72 hours. Sepsis Labs:  Recent Labs Lab 09/10/17 0709  LATICACIDVEN 1.45    Recent Results (from the past 240 hour(s))  Urine Culture     Status: Abnormal   Collection Time: 09/11/17  1:14 PM  Result Value Ref Range Status   Specimen Description URINE, RANDOM  Final   Special Requests NONE  Final   Culture MULTIPLE SPECIES PRESENT, SUGGEST RECOLLECTION (A)  Final   Report Status 09/12/2017 FINAL  Final  Culture, blood (routine x 2)     Status: None (Preliminary result)   Collection Time: 09/11/17  1:46 PM  Result Value Ref Range Status   Specimen Description BLOOD LEFT ANTECUBITAL  Final   Special Requests   Final    BOTTLES DRAWN AEROBIC ONLY Blood Culture adequate volume   Culture   Final    NO GROWTH 2 DAYS Performed at [facility], [address] [place], [place] [address]    Report Status PENDING  Incomplete  Culture, blood (routine x 2)     Status: None (Preliminary result)   Collection Time: 09/11/17  1:58 PM  Result Value Ref Range Status   Specimen Description BLOOD LEFT ARM  Final   Special Requests   Final    BOTTLES DRAWN AEROBIC ONLY Blood Culture adequate volume   Culture   Final    NO GROWTH 2 DAYS Performed at [facility], [address], [place], [place] [address]    Report Status PENDING  Incomplete  Surgical pcr screen     Status: None   Collection Time: 09/12/17 10:42 PM  Result Value Ref Range Status   MRSA, PCR NEGATIVE NEGATIVE Final   Staphylococcus aureus NEGATIVE NEGATIVE Final    Comment: (NOTE) The Xpert SA Assay  (FDA approved for NASAL specimens in patients 36 years of age and older), is one component of a comprehensive surveillance program. It is not intended to diagnose infection nor to guide or monitor treatment.          Radiology Studies: Dg Chest 2 View  Result Date: 09/12/2017 CLINICAL DATA:  Hypoxia, shortness of Breath EXAM: CHEST  2  VIEW COMPARISON:  02/04/2015 FINDINGS: Prior CABG. There is bibasilar atelectasis. Small bilateral effusions. Heart is normal size. No acute bony abnormality. IMPRESSION: Bibasilar atelectasis.  Small effusions. Electronically Signed   By: Charlett Nose M.D.   On: 09/12/2017 09:44   Nm Gastric Emptying  Result Date: 09/12/2017 CLINICAL DATA:  Abdominal pain and bloating.  Diabetes. EXAM: NUCLEAR MEDICINE GASTRIC EMPTYING SCAN TECHNIQUE: After oral ingestion of radiolabeled meal, sequential abdominal images were obtained for 4 hours. Percentage of activity emptying the stomach was calculated at 1 hour, 2 hour, 3 hour, and 4 hours. RADIOPHARMACEUTICALS:  2.1 mCi Tc-40m sulfur colloid in standardized meal COMPARISON:  None. FINDINGS: Expected location of the stomach in the left upper quadrant. Ingested meal empties the stomach slowly over the course of the study. 12.1% emptied at 1 hr ( normal >= 10%) 24.3% emptied at 2 hr ( normal >= 40%) 27.6% emptied at 3 hr ( normal >= 70%) 26.8% emptied at 4 hr ( normal >= 90%) IMPRESSION: Delayed gastric emptying study. Electronically Signed   By: Myles Rosenthal M.D.   On: 09/12/2017 14:07        Scheduled Meds: . aspirin EC  81 mg Oral Daily  . gabapentin  100 mg Oral BID  . heparin  5,000 Units Subcutaneous Q8H  . insulin aspart  0-15 Units Subcutaneous TID WC  . insulin aspart  0-5 Units Subcutaneous QHS  . insulin aspart  3 Units Subcutaneous TID WC  . insulin glargine  12 Units Subcutaneous QHS  . isosorbide mononitrate  60 mg Oral Daily  . labetalol  200 mg Oral BID  . neomycin-bacitracin-polymyxin   Topical  Daily  . pantoprazole (PROTONIX) IV  40 mg Intravenous Q24H   Continuous Infusions: . sodium chloride 10 mL/hr at 09/12/17 0800     LOS: 3 days    Time spent: 35 minutes.     Zannie Cove, MD [facility] Pager [phone]  If 7PM-7AM, please contact night-coverage [email or web address] Password [email or web address] 09/13/2017, 3:18 PM

## 2017-09-13 NOTE — Transfer of Care (Signed)
Immediate Anesthesia Transfer of Care Note  Patient: Henry [name].  Procedure(s) Performed: Procedure(s): LAPAROSCOPIC CHOLECYSTECTOMY (N/A)  Patient Location: PACU  Anesthesia Type:General  Level of Consciousness: sedated  Airway & Oxygen Therapy: Patient Spontanous Breathing and Patient connected to face mask oxygen  Post-op Assessment: Report given to RN and Post -op Vital signs reviewed and stable  Post vital signs: Reviewed and stable  Last Vitals:  Vitals:   09/12/17 2104 09/13/17 0535  BP: (!) 148/49 (!) 166/53  Pulse: 75 79  Resp: 18 18  Temp: 37.6 C 37.3 C  SpO2: 96% 97%    Last Pain:  Vitals:   09/13/17 0850  TempSrc:   PainSc: 3       Patients Stated Pain Goal: 3 (09/13/17 0850)  Complications: No apparent anesthesia complications

## 2017-09-13 NOTE — Progress Notes (Signed)
Pre Procedure note for inpatients:   Henry [name]. has been scheduled for Procedure(s): ESOPHAGOGASTRODUODENOSCOPY (EGD) (N/A) today. The various methods of treatment have been discussed with the patient. After consideration of the risks, benefits and treatment options the patient has consented to the planned procedure.   The patient has been seen and labs reviewed. There are no changes in the patient's condition to prevent proceeding with the planned procedure today.  Recent labs:  Lab Results  Component Value Date   WBC 14.4 (H) 09/12/2017   HGB 9.4 (L) 09/12/2017   HCT 27.8 (L) 09/12/2017   PLT 229 09/12/2017   GLUCOSE 341 (H) 09/12/2017   CHOL 167 05/03/2017   TRIG 84 05/03/2017   HDL 40 (L) 05/03/2017   LDLCALC 110 (H) 05/03/2017   ALT 18 09/12/2017   AST 23 09/12/2017   NA 134 (L) 09/12/2017   K 3.8 09/12/2017   CL 105 09/12/2017   CREATININE 1.73 (H) 09/12/2017   BUN 43 (H) 09/12/2017   CO2 21 (L) 09/12/2017   TSH 2.85 05/03/2017   PSA 1.20 10/06/2016   INR 1.08 05/17/2017   HGBA1C 10.2 08/06/2017   MICROALBUR 14.1 (H) 09/23/2015    Rodman Pickle, MD 09/13/2017 8:40 AM

## 2017-09-13 NOTE — Progress Notes (Signed)
Back from PACU.A&Ox4 drowsey.ABD w/ lap sites and JP drain CDI . Family at [facility]

## 2017-09-14 DIAGNOSIS — K8000 Calculus of gallbladder with acute cholecystitis without obstruction: Secondary | ICD-10-CM

## 2017-09-14 LAB — GLUCOSE, CAPILLARY
GLUCOSE-CAPILLARY: 254 mg/dL — AB (ref 65–99)
GLUCOSE-CAPILLARY: 275 mg/dL — AB (ref 65–99)
Glucose-Capillary: 371 mg/dL — ABNORMAL HIGH (ref 65–99)
Glucose-Capillary: 329 mg/dL — ABNORMAL HIGH (ref 65–99)

## 2017-09-14 LAB — CBC
HEMATOCRIT: 27.6 % — AB (ref 39.0–52.0)
HEMOGLOBIN: 9.3 g/dL — AB (ref 13.0–17.0)
MCH: 29.3 pg (ref 26.0–34.0)
MCHC: 33.7 g/dL (ref 30.0–36.0)
MCV: 87.1 fL (ref 78.0–100.0)
Platelets: 229 10*3/uL (ref 150–400)
RBC: 3.17 MIL/uL — AB (ref 4.22–5.81)
RDW: 13.5 % (ref 11.5–15.5)
WBC: 10.6 10*3/uL — AB (ref 4.0–10.5)

## 2017-09-14 LAB — BASIC METABOLIC PANEL
ANION GAP: 10 (ref 5–15)
BUN: 40 mg/dL — ABNORMAL HIGH (ref 6–20)
CHLORIDE: 106 mmol/L (ref 101–111)
CO2: 20 mmol/L — AB (ref 22–32)
Calcium: 8.1 mg/dL — ABNORMAL LOW (ref 8.9–10.3)
Creatinine, Ser: 1.72 mg/dL — ABNORMAL HIGH (ref 0.61–1.24)
GFR calc Af Amer: 43 mL/min — ABNORMAL LOW (ref >60)
GFR, EST NON AFRICAN AMERICAN: 37 mL/min — AB (ref >60)
GLUCOSE: 297 mg/dL — AB (ref 65–99)
POTASSIUM: 3.6 mmol/L (ref 3.5–5.1)
Sodium: 136 mmol/L (ref 135–145)

## 2017-09-14 MED ORDER — HYDROCODONE-ACETAMINOPHEN 5-325 MG PO TABS
1.0000 | ORAL_TABLET | Freq: Four times a day (QID) | ORAL | 0 refills | Status: DC | PRN
Start: 2017-09-14 — End: 2017-09-15

## 2017-09-14 MED ORDER — INSULIN GLARGINE 100 UNIT/ML ~~LOC~~ SOLN
25.0000 [IU] | Freq: Every day | SUBCUTANEOUS | Status: DC
  Administered 2017-09-14: 25 [IU] via SUBCUTANEOUS
  Filled 2017-09-14: qty 0.25

## 2017-09-14 MED ORDER — PANTOPRAZOLE SODIUM 40 MG PO TBEC
40.0000 mg | DELAYED_RELEASE_TABLET | Freq: Every day | ORAL | Status: DC
  Administered 2017-09-14 – 2017-09-15 (×2): 40 mg via ORAL
  Filled 2017-09-14 (×2): qty 1

## 2017-09-14 MED ORDER — INSULIN GLARGINE 100 UNIT/ML ~~LOC~~ SOLN
8.0000 [IU] | Freq: Once | SUBCUTANEOUS | Status: AC
  Administered 2017-09-14: 8 [IU] via SUBCUTANEOUS
  Filled 2017-09-14: qty 0.08

## 2017-09-14 NOTE — Anesthesia Postprocedure Evaluation (Signed)
Anesthesia Post Note  Patient: Henry [name].  Procedure(s) Performed: Procedure(s) (LRB): LAPAROSCOPIC CHOLECYSTECTOMY (N/A)     Patient location during evaluation: PACU Anesthesia Type: General Level of consciousness: awake and alert Pain management: pain level controlled Vital Signs Assessment: post-procedure vital signs reviewed and stable Respiratory status: spontaneous breathing, nonlabored ventilation, respiratory function stable and patient connected to nasal cannula oxygen Cardiovascular status: blood pressure returned to baseline and stable Postop Assessment: no apparent nausea or vomiting Anesthetic complications: no    Last Vitals:  Vitals:   09/13/17 2350 09/14/17 0500  BP: (!) 161/51 (!) 163/59  Pulse: 67 69  Resp: 16 16  Temp: 36.9 C 36.8 C  SpO2: 95% 94%    Last Pain:  Vitals:   09/14/17 0810  TempSrc:   PainSc: 0-No pain                 Nakhia Levitan S

## 2017-09-14 NOTE — Progress Notes (Signed)
PROGRESS NOTE    Henry [name].  ZOX:[id number] DOB: [birth year] DOA: 09/10/2017 PCP: Romero Belling, MD    Brief Narrative: Pt is a76 yo male with hx of  CAD s/p CABG and stent placements, HTN, DMII who came with cc of severe lower abdominal pain, started 1 am 9/17  morning, woke him up, non radiating, associated with nausea and vomiting w/o constipation or diarrhea, constant, w/o fever/chills. He had similar episode once last week that resolved on its own.  Assessment & Plan:  1-Acute Abdominal pain, nausea, vomiting.  -due to symptomatic cholelithiases/Gb sludge/biliary colic likely -symptoms not typical for gastroparesis despite delayed gastric emptying -EGD normal -s/p Lap chole 9/20 : noted Necrotic gall bladder in OR - c/o soreness, dizziness and nausea, doesn't feel well enough for DC today -advance diet, increase activity  2- CKD, cr baseline 1.5 -stable  3. Mild hypoxia  -iatrogenic from IVF administration, with small pleural effusions, -s/p lasix-now improved, weaned O2  4-DM type one ; he is brittle Diabetic.  -increase lantus, add novolog with meals, SSI  5. HTN;  -continue labetalol, Imdur.    6. Hx of CAD s/p CABG/stents: -no chest pain, initial EKG/trop unremarkable, will continue asp, pt is intolerant of statins. Continue BB.  7. Gastroparesis -no symptoms, monitor -Rx DM better  DVT prophylaxis: Heparin SQ Code Status: full code.  Family Communication:  No family at bedside Disposition Plan: home when improved  Consultants:   GI   Surgery    Procedures:   Endoscopy normal.    Antimicrobials: cipro    Subjective: -mild R side abd pain, no N/V  Objective: Vitals:   09/13/17 2350 09/14/17 0500 09/14/17 1004 09/14/17 1356  BP: (!) 161/51 (!) 163/59 (!) 155/62 (!) 155/49  Pulse: 67 69 74 70  Resp: Temp: 98.4 F (36.9 C) 98.2 F (36.8 C)  99.4 F (37.4 C)  TempSrc: Oral Oral  Oral  SpO2: 95% 94%  94%  Weight:        Height:        Intake/Output Summary (Last 24 hours) at 09/14/17 1608 Last data filed at 09/14/17 1356  Gross per 24 hour  Intake              810 ml  Output              305 ml  Net              505 ml   Filed Weights   09/10/17 0406 09/10/17 1513 09/11/17 1120  Weight: 74.8 kg (165 lb) 72.9 kg (160 lb 11.5 oz) 72.6 kg (160 lb)    Examination:  Gen: Awake, Alert, Oriented X 3,  HEENT: PERRLA, Neck supple, no JVD Lungs: Good air movement bilaterally, CTAB CVS: RRR,No Gallops,Rubs or new Murmurs Abd: soft, mild tenderness around port site, BS present Extremities: No Cyanosis, Clubbing or edema Skin: no new rashes    Data Reviewed: I have personally reviewed following labs and imaging studies  CBC:  Recent Labs Lab 09/10/17 0420 09/10/17 0433 09/11/17 0511 09/12/17 0455 09/14/17 0430  WBC 12.3*  --  16.7* 14.4* 10.6*  HGB 11.1* 11.2* 10.3* 9.4* 9.3*  HCT 32.2* 33.0* 30.9* 27.8* 27.6*  MCV 86.8  --  88.0 89.7 87.1  PLT 267  --  245 229 229   Basic Metabolic Panel:  Recent Labs Lab 09/10/17 0420 09/10/17 0433 09/11/17 0511 09/12/17 0455 09/14/17 0430  NA 138 142 136  134* 136  K 3.7 3.7 3.8 3.8 3.6  CL 106 107 105 105 106  CO2 22  --  22 21* 20*  GLUCOSE 94 89 200* 341* 297*  BUN 34* 33* 37* 43* 40*  CREATININE 1.51* 1.60* 1.54* 1.73* 1.72*  CALCIUM 9.4  --  8.2* 7.9* 8.1*   GFR: Estimated Creatinine Clearance: 38.1 mL/min (A) (by C-G formula based on SCr of 1.72 mg/dL (H)). Liver Function Tests:  Recent Labs Lab 09/10/17 0420 09/11/17 0511 09/12/17 0455  AST ALT ALKPHOS 80 79 73  BILITOT 0.8 0.9 0.8  PROT 7.3 6.3* 5.8*  ALBUMIN 3.7 3.1* 2.9*    Recent Labs Lab 09/10/17 0420  LIPASE 19   No results for input(s): AMMONIA in the last 168 hours. Coagulation Profile: No results for input(s): INR, PROTIME in the last 168 hours. Cardiac Enzymes: No results for input(s): CKTOTAL, CKMB, CKMBINDEX, TROPONINI in the last  168 hours. BNP (last 3 results) No results for input(s): PROBNP in the last 8760 hours. HbA1C: No results for input(s): HGBA1C in the last 72 hours. CBG:  Recent Labs Lab 09/13/17 1259 09/13/17 1655 09/13/17 2229 09/14/17 0726 09/14/17 1150  GLUCAP 200* 226* 328* 371* 329*   Lipid Profile: No results for input(s): CHOL, HDL, LDLCALC, TRIG, CHOLHDL, LDLDIRECT in the last 72 hours. Thyroid Function Tests: No results for input(s): TSH, T4TOTAL, FREET4, T3FREE, THYROIDAB in the last 72 hours. Anemia Panel: No results for input(s): VITAMINB12, FOLATE, FERRITIN, TIBC, IRON, RETICCTPCT in the last 72 hours. Sepsis Labs:  Recent Labs Lab 09/10/17 0709  LATICACIDVEN 1.45    Recent Results (from the past 240 hour(s))  Urine Culture     Status: Abnormal   Collection Time: 09/11/17  1:14 PM  Result Value Ref Range Status   Specimen Description URINE, RANDOM  Final   Special Requests NONE  Final   Culture MULTIPLE SPECIES PRESENT, SUGGEST RECOLLECTION (A)  Final   Report Status 09/12/2017 FINAL  Final  Culture, blood (routine x 2)     Status: None (Preliminary result)   Collection Time: 09/11/17  1:46 PM  Result Value Ref Range Status   Specimen Description BLOOD LEFT ANTECUBITAL  Final   Special Requests   Final    BOTTLES DRAWN AEROBIC ONLY Blood Culture adequate volume   Culture   Final    NO GROWTH 3 DAYS Performed at [facility], [address] [place], [place] [address]    Report Status PENDING  Incomplete  Culture, blood (routine x 2)     Status: None (Preliminary result)   Collection Time: 09/11/17  1:58 PM  Result Value Ref Range Status   Specimen Description BLOOD LEFT ARM  Final   Special Requests   Final    BOTTLES DRAWN AEROBIC ONLY Blood Culture adequate volume   Culture   Final    NO GROWTH 3 DAYS Performed at [facility], [address] [place], [place] [address]    Report Status PENDING  Incomplete  Surgical pcr screen     Status: None     Collection Time: 09/12/17 10:42 PM  Result Value Ref Range Status   MRSA, PCR NEGATIVE NEGATIVE Final   Staphylococcus aureus NEGATIVE NEGATIVE Final    Comment: (NOTE) The Xpert SA Assay (FDA approved for NASAL specimens in patients 59 years of age and older), is one component of a comprehensive surveillance program. It is not intended to diagnose  infection nor to guide or monitor treatment.          Radiology Studies: No results found.      Scheduled Meds: . aspirin EC  81 mg Oral Daily  . gabapentin  100 mg Oral BID  . heparin  5,000 Units Subcutaneous Q8H  . insulin aspart  0-15 Units Subcutaneous TID WC  . insulin aspart  0-5 Units Subcutaneous QHS  . insulin aspart  3 Units Subcutaneous TID WC  . insulin glargine  12 Units Subcutaneous QHS  . isosorbide mononitrate  60 mg Oral Daily  . labetalol  200 mg Oral BID  . neomycin-bacitracin-polymyxin   Topical Daily  . pantoprazole  40 mg Oral Daily   Continuous Infusions: . sodium chloride 10 mL/hr at 09/12/17 0800     LOS: 4 days    Time spent: 35 minutes.     Zannie Cove, MD [facility] Pager [phone]  If 7PM-7AM, please contact night-coverage [email or web address] Password [email or web address] 09/14/2017, 4:08 PM

## 2017-09-14 NOTE — Discharge Instructions (Signed)
[facility] ______[facility] °LAPAROSCOPIC SURGERY: POST OP INSTRUCTIONS °Always review your discharge instruction sheet given to you by the facility where your surgery was performed. °IF YOU HAVE DISABILITY OR FAMILY LEAVE FORMS, YOU MUST BRING THEM TO THE OFFICE FOR PROCESSING.   °DO NOT GIVE THEM TO YOUR DOCTOR. ° °1. A prescription for pain medication may be given to you upon discharge.  Take your pain medication as prescribed, if needed.  If narcotic pain medicine is not needed, then you may take acetaminophen (Tylenol) or ibuprofen (Advil) as needed. °2. Take your usually prescribed medications unless otherwise directed. °3. If you need a refill on your pain medication, please contact your pharmacy.  They will contact our office to request authorization. Prescriptions will not be filled after 5pm or on week-ends. °4. You should follow a light diet the first few days after arrival home, such as soup and crackers, etc.  Be sure to include lots of fluids daily. °5. Most patients will experience some swelling and bruising in the area of the incisions.  Ice packs will help.  Swelling and bruising can take several days to resolve.  °6. It is common to experience some constipation if taking pain medication after surgery.  Increasing fluid intake and taking a stool softener (such as Colace) will usually help or prevent this problem from occurring.  A mild laxative (Milk of Magnesia or Miralax) should be taken according to package instructions if there are no bowel movements after 48 hours. °7. Unless discharge instructions indicate otherwise, you may remove your bandages 24-48 hours after surgery, and you may shower at that time.  You may have steri-strips (small skin tapes) in place directly over the incision.  These strips should be left on the skin for 7-10 days.  If your surgeon used skin glue on the incision, you may shower in 24 hours.  The glue will flake off over the next 2-3 weeks.  Any sutures or  staples will be removed at the office during your follow-up visit. °8. ACTIVITIES:  You may resume regular (light) daily activities beginning the next day--such as daily self-care, walking, climbing stairs--gradually increasing activities as tolerated.  You may have sexual intercourse when it is comfortable.  Refrain from any heavy lifting or straining until approved by your doctor. °a. You may drive when you are no longer taking prescription pain medication, you can comfortably wear a seatbelt, and you can safely maneuver your car and apply brakes. °b. RETURN TO WORK:  __________________________________________________________ °9. You should see your doctor in the office for a follow-up appointment approximately 2-3 weeks after your surgery.  Make sure that you call for this appointment within a day or two after you arrive home to insure a convenient appointment time. °10. OTHER INSTRUCTIONS: __________________________________________________________________________________________________________________________ __________________________________________________________________________________________________________________________ °WHEN TO CALL YOUR DOCTOR: °1. Fever over 101.0 °2. Inability to urinate °3. Continued bleeding from incision. °4. Increased pain, redness, or drainage from the incision. °5. Increasing abdominal pain ° °The clinic staff is available to answer your questions during regular business hours.  Please don’t hesitate to call and ask to speak to one of the nurses for clinical concerns.  If you have a medical emergency, go to the nearest emergency room or call 911.  A surgeon from [facility] is always on call at the hospital. °[address], [address], [place], [place]  [address] ? [address] [address], [place],    [address] °[phone] ? [phone] ? FAX [phone] °Web site:   [email or web address] ° ° °Laparoscopic Cholecystectomy, Care After °This sheet  gives you information about how to care for yourself after your procedure. Your health care provider may also give you more specific instructions. If you have problems or questions, contact your health care provider. °What can I expect after the procedure? °After the procedure, it is common to have: °· Pain at your incision sites. You will be given medicines to control this pain. °· Mild nausea or vomiting. °· Bloating and possible shoulder pain from the air-like gas that was used during the procedure. ° °Follow these instructions at home: °Incision care ° °· Follow instructions from your health care provider about how to take care of your incisions. Make sure you: °? Wash your hands with soap and water before you change your bandage (dressing). If soap and water are not available, use hand sanitizer. °? Change your dressing as told by your health care provider. °? Leave stitches (sutures), skin glue, or adhesive strips in place. These skin closures may need to be in place for 2 weeks or longer. If adhesive strip edges start to loosen and curl up, you may trim the loose edges. Do not remove adhesive strips completely unless your health care provider tells you to do that. °· Do not take baths, swim, or use a hot tub until your health care provider approves. Ask your health care provider if you can take showers. You may only be allowed to take sponge baths for bathing. °· Check your incision area every day for signs of infection. Check for: °? More redness, swelling, or pain. °? More fluid or blood. °? Warmth. °? Pus or a bad smell. °Activity °· Do not drive or use heavy machinery while taking prescription pain medicine. °· Do not lift anything that is heavier than 10 lb (4.5 kg) until your health care provider approves. °· Do not play contact sports until your health care provider approves. °· Do not drive for 24 hours if you were given a medicine to help you relax (sedative). °· Rest as needed. Do not return to work  or school until your health care provider approves. °General instructions °· Take over-the-counter and prescription medicines only as told by your health care provider. °· To prevent or treat constipation while you are taking prescription pain medicine, your health care provider may recommend that you: °? Drink enough fluid to keep your urine clear or pale yellow. °? Take over-the-counter or prescription medicines. °? Eat foods that are high in fiber, such as fresh fruits and vegetables, whole grains, and beans. °? Limit foods that are high in fat and processed sugars, such as fried and sweet foods. °Contact a health care provider if: °· You develop a rash. °· You have more redness, swelling, or pain around your incisions. °· You have more fluid or blood coming from your incisions. °· Your incisions feel warm to the touch. °· You have pus or a bad smell coming from your incisions. °· You have a fever. °· One or more of your incisions breaks open. °Get help right away if: °· You have trouble breathing. °· You have chest pain. °· You have increasing pain in your shoulders. °· You faint or feel dizzy when you stand. °· You have severe pain in your abdomen. °· You have nausea or vomiting that lasts for more than one day. °· You have leg pain. °This information is not intended to replace advice given to you by your health care provider. Make sure you   discuss any questions you have with your health care provider. °Document Released: 12/11/2005 Document Revised: 07/01/2016 Document Reviewed: 05/29/2016 °Elsevier Interactive Patient Education © 2017 [employer] ° °

## 2017-09-14 NOTE — Progress Notes (Signed)
Results for FIDEL, CAGGIANO (MRN [id number]) as of 09/14/2017 12:40  Ref. Range 09/13/2017 12:59 09/13/2017 16:55 09/13/2017 22:29 09/14/2017 07:26 09/14/2017 11:50  Glucose-Capillary Latest Ref Range: 65 - 99 mg/dL 409 (H) 811 (H) 914 (H) 371 (H) 329 (H)  Noted that blood sugars are still elevated. Has gotten a total of 20 units of Lantus since last night at 2200. Recommend changing order to Lantus 20-25 units every HS and continuing Novolog MODERATE correction scale TID & HS and continuing Novolog 3 units TID with meals if eating at least 50% of meal. (Home dose of 70/30 insulin= to Lantus 37 units) Titrate dosages as needed.   Rotondo Mince RN BSN CDE Diabetes Coordinator Pager: [phone]  8am-5pm

## 2017-09-14 NOTE — Progress Notes (Signed)
     [facility] Progress Note  Subjective:  Feels ok.  Has already had 2 BM's.  Says that his pain now is not the same that he had prior to surgery; now just appropriate soreness related to surgery itself.  Objective:  Vital signs in last 24 hours: Temp:  [97.4 F (36.3 C)-100.2 F (37.9 C)] 98.2 F (36.8 C) (09/21 0500) Pulse Rate:  [64-78] 74 (09/21 1004) Resp:  [12-20] 16 (09/21 0500) BP: (130-193)/(45-90) 155/62 (09/21 1004) SpO2:  [91 %-100 %] 94 % (09/21 0500) Last BM Date: 09/10/17 General:  Alert, Well-developed, in NAD Heart:  Regular rate and rhythm; no murmurs Pulm:  CTAB.  No increased WOB. Abdomen:  Soft, non-distended.  BS present.  Appropriate TTP. Extremities:  Without edema. Neurologic:  Alert and oriented x 4;  grossly normal neurologically. Psych:  Alert and cooperative. Normal mood and affect.  Intake/Output from previous day: 09/20 0701 - 09/21 0700 In: 1810 [P.O.:420; I.V.:1390] Out: 870 [Urine:725; Drains:95; Blood:50]  Lab Results:  Recent Labs  09/12/17 0455 09/14/17 0430  WBC 14.4* 10.6*  HGB 9.4* 9.3*  HCT 27.8* 27.6*  PLT 229 229   BMET  Recent Labs  09/12/17 0455 09/14/17 0430  NA 134* 136  K 3.8 3.6  CL 105 106  CO2 21* 20*  GLUCOSE 341* 297*  BUN 43* 40*  CREATININE 1.73* 1.72*  CALCIUM 7.9* 8.1*   LFT  Recent Labs  09/12/17 0455  PROT 5.8*  ALBUMIN 2.9*  AST 23  ALT 18  ALKPHOS 73  BILITOT 0.8   Nm Gastric Emptying  Result Date: 09/12/2017 CLINICAL DATA:  Abdominal pain and bloating.  Diabetes. EXAM: NUCLEAR MEDICINE GASTRIC EMPTYING SCAN TECHNIQUE: After oral ingestion of radiolabeled meal, sequential abdominal images were obtained for 4 hours. Percentage of activity emptying the stomach was calculated at 1 hour, 2 hour, 3 hour, and 4 hours. RADIOPHARMACEUTICALS:  2.1 mCi Tc-76m sulfur colloid in standardized meal COMPARISON:  None. FINDINGS: Expected location of the stomach in the left upper  quadrant. Ingested meal empties the stomach slowly over the course of the study. 12.1% emptied at 1 hr ( normal >= 10%) 24.3% emptied at 2 hr ( normal >= 40%) 27.6% emptied at 3 hr ( normal >= 70%) 26.8% emptied at 4 hr ( normal >= 90%) IMPRESSION: Delayed gastric emptying study. Electronically Signed   By: Myles Rosenthal M.D.   On: 09/12/2017 14:07   Assessment / Plan: *Acute onset diffuse abdominal pain, nausea, and clear emesis.  Underwent extensive evaluation that was essentially unrevealing except for gallstones.  Abdominal pain localized to RUQ and he went for cholecystectomy on 9/20.  Found to have necrotic gallbladder, which was likely the cause of his symptoms.  No further recommendations from GI standpoint at this time.  Will sign off.   LOS: 4 days   ZEHR, JESSICA D.  09/14/2017, 10:52 AM  Pager number [phone]  GI ATTENDING  Interval history data reviewed. Disease gallbladder removed. Anticipate that he will do well. Standard postoperative care and postoperative follow-up this point. Will sign off.  Wilhemina Bonito. Eda Keys., M.D. [facility] Division of Gastroenterology

## 2017-09-14 NOTE — Progress Notes (Signed)
Progress Note: General Surgery Service   Assessment/Plan: Patient Active Problem List   Diagnosis Date Noted  . 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 Diabetic gastroparesis (HCC)   . Abdominal pain 09/10/2017  . Facial problem 08/06/2017  . CKD (chronic kidney disease), stage III 05/17/2017  . Transient alteration of awareness 05/17/2017  . Hypoglycemia due to insulin 05/17/2017  . Peripheral arterial disease (HCC) 11/04/2014  . Claudication (HCC) 09/24/2014  . Premature ventricular contractions (PVCs) (VPCs) 04/06/2014  . NSVT (nonsustained ventricular tachycardia) (HCC) 04/06/2014  . Diabetic peripheral neuropathy (HCC) 02/04/2014  . Dizziness 07/10/2013  . Diabetes (HCC) 06/13/2013  . 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 Screening for prostate cancer 05/12/2011  . Pure hypercholesterolemia 05/12/2011  . Encounter for long-term (current) use of other medications 05/12/2011  . 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 PROLIFERATIVE DIABETIC RETINOPATHY 05/05/2010  . Dyslipidemia 07/10/2007  . Anemia 07/10/2007  . Essential hypertension 07/10/2007  . Coronary atherosclerosis 07/10/2007  . Disorder resulting from impaired renal function 07/10/2007   s/p Procedure(s): LAPAROSCOPIC CHOLECYSTECTOMY 09/13/2017 Diet and nausea much improved after removal of necrotic gallbladder -remove drain -ok to discharge from surgery standpoint    LOS: 4 days  Chief Complaint/Subjective: Some pain around umbilicus, nausea much improved  Objective: Vital signs in last 24 hours: Temp:  [97.4 F (36.3 C)-100.2 F (37.9 C)] 98.2 F (36.8 C) (09/21 0500) Pulse Rate:  [64-78] 69 (09/21 0500) Resp:  [12-20] 16 (09/21 0500) BP: (130-193)/(45-90) 163/59 (09/21 0500) SpO2:  [91 %-100 %] 94 % (09/21 0500) Last BM Date: 09/10/17  Intake/Output from previous day: 09/20 0701 - 09/21 0700 In: 1810 [P.O.:420;  I.V.:1390] Out: 870 [Urine:725; Drains:95; Blood:50] Intake/Output this shift: No intake/output data recorded.   Abd: soft, ATTP, incision c/d/i, drain with SS drainage  Extremities: no edema  Neuro: AOx4  Lab Results: CBC   Recent Labs  09/12/17 0455 09/14/17 0430  WBC 14.4* 10.6*  HGB 9.4* 9.3*  HCT 27.8* 27.6*  PLT 229 229   BMET  Recent Labs  09/12/17 0455 09/14/17 0430  NA 134* 136  K 3.8 3.6  CL 105 106  CO2 21* 20*  GLUCOSE 341* 297*  BUN 43* 40*  CREATININE 1.73* 1.72*  CALCIUM 7.9* 8.1*   PT/INR No results for input(s): LABPROT, INR in the last 72 hours. ABG No results for input(s): PHART, HCO3 in the last 72 hours.  Invalid input(s): PCO2, PO2  Studies/Results:  Anti-infectives: Anti-infectives    Start     Dose/Rate Route Frequency Ordered Stop   09/11/17 1200  ciprofloxacin (CIPRO) IVPB 400 mg  Status:  Discontinued     400 mg 200 mL/hr over 60 Minutes Intravenous Every 12 hours 09/11/17 1033 09/12/17 1337      Medications: Scheduled Meds: . aspirin EC  81 mg Oral Daily  . gabapentin  100 mg Oral BID  . heparin  5,000 Units Subcutaneous Q8H  . insulin aspart  0-15 Units Subcutaneous TID WC  . insulin aspart  0-5 Units Subcutaneous QHS  . insulin aspart  3 Units Subcutaneous TID WC  . insulin glargine  12 Units Subcutaneous QHS  . insulin glargine  8 Units Subcutaneous Once  . isosorbide mononitrate  60 mg Oral Daily  . labetalol  200 mg Oral BID  . neomycin-bacitracin-polymyxin   Topical Daily  . pantoprazole (PROTONIX) IV  40 mg Intravenous Q24H   Continuous Infusions: . sodium chloride 10  mL/hr at 09/12/17 0800   PRN Meds:.acetaminophen, magic mouthwash, menthol-cetylpyridinium, morphine injection, ondansetron (ZOFRAN) IV, oxyCODONE, phenol  Rodman Pickle, MD Pg# [phone] [facility]

## 2017-09-15 LAB — BASIC METABOLIC PANEL
ANION GAP: 7 (ref 5–15)
BUN: 35 mg/dL — ABNORMAL HIGH (ref 6–20)
CALCIUM: 8.2 mg/dL — AB (ref 8.9–10.3)
CO2: 22 mmol/L (ref 22–32)
Chloride: 106 mmol/L (ref 101–111)
Creatinine, Ser: 1.79 mg/dL — ABNORMAL HIGH (ref 0.61–1.24)
GFR, EST AFRICAN AMERICAN: 41 mL/min — AB (ref >60)
GFR, EST NON AFRICAN AMERICAN: 35 mL/min — AB (ref >60)
Glucose, Bld: 202 mg/dL — ABNORMAL HIGH (ref 65–99)
Potassium: 3.4 mmol/L — ABNORMAL LOW (ref 3.5–5.1)
SODIUM: 135 mmol/L (ref 135–145)

## 2017-09-15 LAB — CBC
HCT: 26 % — ABNORMAL LOW (ref 39.0–52.0)
Hemoglobin: 8.9 g/dL — ABNORMAL LOW (ref 13.0–17.0)
MCH: 30.3 pg (ref 26.0–34.0)
MCHC: 34.2 g/dL (ref 30.0–36.0)
MCV: 88.4 fL (ref 78.0–100.0)
PLATELETS: 260 10*3/uL (ref 150–400)
RBC: 2.94 MIL/uL — ABNORMAL LOW (ref 4.22–5.81)
RDW: 13.5 % (ref 11.5–15.5)
WBC: 11.4 10*3/uL — AB (ref 4.0–10.5)

## 2017-09-15 MED ORDER — HYDROCODONE-ACETAMINOPHEN 5-325 MG PO TABS
1.0000 | ORAL_TABLET | Freq: Four times a day (QID) | ORAL | 0 refills | Status: DC | PRN
Start: 2017-09-15 — End: 2018-07-26

## 2017-09-15 MED ORDER — POLYETHYLENE GLYCOL 3350 17 G PO PACK
17.0000 g | PACK | Freq: Every day | ORAL | 0 refills | Status: DC | PRN
Start: 2017-09-15 — End: 2018-07-28

## 2017-09-15 NOTE — Progress Notes (Signed)
[facility] Office:  [phone] General Surgery Progress Note   LOS: 5 days  POD -  2 Days Post-Op  Chief Complaint: Abdominal pain  Assessment and Plan: 1.  LAPAROSCOPIC CHOLECYSTECTOMY - 06/13/2017 - [facility]  WBC - 11,400 - 09/15/2017  Looks good - ready to go home.  Follow up with Dr. Sheliah Hatch in 2 weeks.  2.  Anemia -  Hgb - 8.9 - 09/15/2017 3.  Creatinine - 1.79 - 09/15/2017   Active Problems:   Abdominal pain   Calculus of gallbladder without cholecystitis without obstruction   Diabetic gastroparesis (HCC)   Subjective:  Doing much better.  Discussed physical activity, showers, and follow up. Wife in room with patient.  Objective:   Vitals:   09/15/17 0431 09/15/17 0943  BP: (!) 169/46 (!) 160/66  Pulse: 71 78  Resp: 16   Temp: 98.5 F (36.9 C)   SpO2: 94%      Intake/Output from previous day:  09/21 0701 - 09/22 0700 In: 240 [P.O.:240] Out: 500 [Urine:450; Drains:50]  Intake/Output this shift:  Total I/O In: -  Out: 500 [Urine:500]   Physical Exam:   General: WN older WM who is alert and oriented.    HEENT: Normal. Pupils equal. .   Lungs: Clear   Abdomen: Soft.  Has BS.   Wound: Clean   Lab Results:    Recent Labs  09/14/17 0430 09/15/17 0505  WBC 10.6* 11.4*  HGB 9.3* 8.9*  HCT 27.6* 26.0*  PLT 229 260    BMET   Recent Labs  09/14/17 0430 09/15/17 0505  NA 136 135  K 3.6 3.4*  CL 106 106  CO2 20* 22  GLUCOSE 297* 202*  BUN 40* 35*  CREATININE 1.72* 1.79*  CALCIUM 8.1* 8.2*    PT/INR  No results for input(s): LABPROT, INR in the last 72 hours.  ABG  No results for input(s): PHART, HCO3 in the last 72 hours.  Invalid input(s): PCO2, PO2   Studies/Results:  No results found.   Anti-infectives:   Anti-infectives    Start     Dose/Rate Route Frequency Ordered Stop   09/11/17 1200  ciprofloxacin (CIPRO) IVPB 400 mg  Status:  Discontinued     400 mg 200 mL/hr over 60 Minutes Intravenous Every 12 hours  09/11/17 1033 09/12/17 1337      Ovidio Kin, MD, FACS Pager: [phone] [facility] Office: [phone] 09/15/2017

## 2017-09-15 NOTE — Progress Notes (Signed)
Pt for d/c home with wife. Condition stable. VSS, No changes in initial am assessment at this time. Pt/wife verbalized understanding of d/c instructions. Surgical site without compromise.

## 2017-09-16 LAB — CULTURE, BLOOD (ROUTINE X 2)
Culture: NO GROWTH
Culture: NO GROWTH
SPECIAL REQUESTS: ADEQUATE
Special Requests: ADEQUATE

## 2017-09-17 ENCOUNTER — Telehealth: Payer: Self-pay | Admitting: Neurology

## 2017-09-17 LAB — GLUCOSE, CAPILLARY: GLUCOSE-CAPILLARY: 185 mg/dL — AB (ref 65–99)

## 2017-09-17 NOTE — Telephone Encounter (Signed)
Pt left a message saying there has been some change of information regarding his MRI and would like a call back

## 2017-09-18 NOTE — Telephone Encounter (Signed)
Attempted to call Pt x2, was busy, will try again later

## 2017-09-18 NOTE — Discharge Summary (Signed)
Physician Discharge Summary  Henry [name]. JXB:[id number] DOB: 07/01/1941 DOA: 09/10/2017  PCP: Romero Belling, MD  Admit date: 09/10/2017 Discharge date: 09/15/2017  Time spent: 35 minutes  Recommendations for Outpatient Follow-up:  1. PCP in 1 week   Discharge Diagnoses:    Chronic cholecystitis   Necrotic gall bladder   CAD s/p CABG   CKD3   DM on Insulin   Abdominal pain   Calculus of gallbladder without cholecystitis without obstruction   Diabetic gastroparesis [facility])   Discharge Condition:stable  Diet recommendation: diabetic, low fat  Filed Weights   09/10/17 0406 09/10/17 1513 09/11/17 1120  Weight: 74.8 kg (165 lb) 72.9 kg (160 lb 11.5 oz) 72.6 kg (160 lb)    History of present illness:   Pt is a76 yo male with hx of CAD s/p CABG and stent placements, HTN, DMII who came with cc of severe lower abdominal pain, started 1 am 9/17  morning, woke him up, non radiating, associated with nausea and vomiting w/o constipation or diarrhea, constant, w/o fever/chills. He had similar episode once last week that resolved on its own.   Hospital Course:  1-Acute on chronic cholecystitis -CCS consulted, initially G workup was recommended, hence had EGD which was normal and gastric emptying scan showed despite delayed gastric emptying -but symptoms were concerning for cholecystitis, finally [facility] took him to the OR on 9/20 -s/p Lap chole 9/20 : noted Necrotic gall bladder in OR -discharged home 9/22 in a stable condition  2- CKD, cr baseline 1.5 -stable  3. Mild hypoxia  -iatrogenic from IVF administration, with small pleural effusions, -s/p lasix-now improved, weaned O2  4-DM type one ; he is brittle Diabetic.  -increased lantus, used novolog with meals inpatient  5. HTN;  -continue labetalol, Imdur.    6. Hx of CAD s/p CABG/stents: -no chest pain, initial EKG/trop unremarkable, Continued BB.  7. Gastroparesis -no symptoms, monitor -Rx DM  better  Procedures:  EGD: normal  Gastric emptying scan delayed  9/20: lap cholecystectomy  Consultations:  CCS, GI  Discharge Exam: Vitals:   09/15/17 0431 09/15/17 0943  BP: (!) 169/46 (!) 160/66  Pulse: 71 78  Resp: 16   Temp: 98.5 F (36.9 C)   SpO2: 94%     General: AAOx3 Cardiovascular: S1S2/RRR Respiratory: CTAB  Discharge Instructions   Discharge Instructions    Discharge instructions    Complete by:  As directed    Low fat diet   Increase activity slowly    Complete by:  As directed      Discharge Medication List as of 09/15/2017 11:17 AM    START taking these medications   Details  polyethylene glycol (MIRALAX / GLYCOLAX) packet Take 17 g by mouth daily as needed., Starting Sat 09/15/2017, Print      CONTINUE these medications which have CHANGED   Details  HYDROcodone-acetaminophen (NORCO) 5-325 MG tablet Take 1 tablet by mouth every 6 (six) hours as needed for moderate pain., Starting Sat 09/15/2017, Print      CONTINUE these medications which have NOT CHANGED   Details  acetaminophen (TYLENOL) 500 MG tablet Take 500 mg by mouth daily as needed (pain)., Until Discontinued, Historical Med    aspirin EC 81 MG tablet Take 81 mg by mouth daily., Until Discontinued, Historical Med    B Complex-C-Folic Acid (FOLBEE PLUS) TABS Take 1 tablet by mouth daily.  , Until Discontinued, Historical Med    Cholecalciferol (VITAMIN D) 2000 UNITS CAPS Take 2,000 Units by  mouth daily. , Until Discontinued, Historical Med    gabapentin (NEURONTIN) 100 MG capsule Take 100 mg by mouth 2 (two) times daily. , Until Discontinued, Historical Med    hydrochlorothiazide (MICROZIDE) 12.5 MG capsule Take 12.5 mg by mouth daily., Until Discontinued, Historical Med    insulin NPH-regular Human (NOVOLIN 70/30) (70-30) 100 UNIT/ML injection 42 units with breakfast and 25 units with supper, Print    isosorbide mononitrate (IMDUR) 60 MG 24 hr tablet TAKE 1 TABLET BY MOUTH  DAILY, Normal    labetalol (NORMODYNE) 200 MG tablet TAKE 1 TABLET(200 MG) BY MOUTH TWICE DAILY, Normal    omeprazole (PRILOSEC OTC) 20 MG tablet Take 20 mg by mouth daily., Until Discontinued, Historical Med       Allergies  Allergen Reactions  . Hydralazine Swelling    Feet swelled so bad they were cracking Severe foot swelling  . Penicillins Other (See Comments)    Per allergy test.  . Amlodipine Swelling  . Amlodipine Besylate Swelling  . Codeine Other (See Comments)    anxiety  . Contrast Media [Iodinated Diagnostic Agents]     Kidney issues when given  . Lipitor [Atorvastatin Calcium] Other (See Comments)    myalgia  . Valsartan Other (See Comments) and Swelling    Caused kidney damage  . Zetia [Ezetimibe] Other (See Comments)    Severe dizziness  . Penicillin G Rash   Follow-up Information    [facility] Follow up in 2 week(s).   Specialty:  General Surgery Why:  Office will call you with follow up appointment.  Be at the office 30 minutes early for check in.  Bring insurance information and photo ID. Contact information: [address] [place] [address] [phone]           The results of significant diagnostics from this hospitalization (including imaging, microbiology, ancillary and laboratory) are listed below for reference.    Significant Diagnostic Studies: Dg Chest 2 View  Result Date: 09/12/2017 CLINICAL DATA:  Hypoxia, shortness of Breath EXAM: CHEST  2 VIEW COMPARISON:  02/04/2015 FINDINGS: Prior CABG. There is bibasilar atelectasis. Small bilateral effusions. Heart is normal size. No acute bony abnormality. IMPRESSION: Bibasilar atelectasis.  Small effusions. Electronically Signed   By: Charlett Nose M.D.   On: 09/12/2017 09:44   Dg Abd 1 View  Result Date: 09/11/2017 CLINICAL DATA:  hx of CAD s/p CABG and stent placements, HTN, DMII who came with cc of severe lower abdominal pain, started 1 am  this morning, woke him up, non radiating, associated with nausea and vomiting w/o constipation or diarrhea, constant, without fever or chills. Similar episode last week. EXAM: ABDOMEN - 1 VIEW COMPARISON:  Abdominal ultrasound 09/10/2017. FINDINGS: Mild prominence of stool in the proximal colon. Gallstones are present in the gallbladder, measuring up to 1.2 cm in long axis. Aortoiliac atherosclerotic vascular disease. Contrast medium is present in the urinary bladder. IMPRESSION: 1. Cholelithiasis. On yesterday's CT scan, the presence of the exaggerated hepatic parenchymal enhancement along the gallbladder fossa, a small cystic duct stone, and gallbladder wall thickening raise some concern for acute cholecystitis despite the documented lack of a sonographic Murphy's sign on ultrasound. In particular the cystic duct stone might be impacted. Hepatobiliary scan should be considered to rule out cholecystitis. 2. Mild prominence of stool in the ascending colon but otherwise unremarkable bowel gas pattern. Electronically Signed   By: Gaylyn Rong M.D.   On: 09/11/2017 14:37   Nm  Gastric Emptying  Result Date: 09/12/2017 CLINICAL DATA:  Abdominal pain and bloating.  Diabetes. EXAM: NUCLEAR MEDICINE GASTRIC EMPTYING SCAN TECHNIQUE: After oral ingestion of radiolabeled meal, sequential abdominal images were obtained for 4 hours. Percentage of activity emptying the stomach was calculated at 1 hour, 2 hour, 3 hour, and 4 hours. RADIOPHARMACEUTICALS:  2.1 mCi Tc-61m sulfur colloid in standardized meal COMPARISON:  None. FINDINGS: Expected location of the stomach in the left upper quadrant. Ingested meal empties the stomach slowly over the course of the study. 12.1% emptied at 1 hr ( normal >= 10%) 24.3% emptied at 2 hr ( normal >= 40%) 27.6% emptied at 3 hr ( normal >= 70%) 26.8% emptied at 4 hr ( normal >= 90%) IMPRESSION: Delayed gastric emptying study. Electronically Signed   By: Myles Rosenthal M.D.   On: 09/12/2017  14:07   US Abdomen Limited  Result Date: 09/10/2017 CLINICAL DATA:  76 year old male with abdominal pain. Follow-up questionable liver lesion on recent CT. Subsequent encounter. EXAM: ULTRASOUND ABDOMEN LIMITED RIGHT UPPER QUADRANT COMPARISON:  09/10/2017 CT. FINDINGS: Gallbladder: Gallstones measuring up to 1 cm. Minimal sludge. No gallbladder wall thickening. Patient not tender over the gallbladder during scanning per ultrasound technologist. Common bile duct: Diameter: 4.3 mm proximally. Mid to distal aspect not visualized secondary to bowel gas. Liver: No focal lesion identified. Within normal limits in parenchymal echogenicity. Portal vein is patent on color Doppler imaging with normal direction of blood flow towards the liver. Trace right pleural effusion. IMPRESSION: No liver mass is identified as questioned on recent CT. This suggests that CT finding is related to transient hepatic attenuation difference. Gallstones with minimal gallbladder sludge. No ultrasound evidence of gallbladder inflammation. Trace right pleural effusion. Electronically Signed   By: Lacy Duverney M.D.   On: 09/10/2017 10:48   Ct Angio Chest/abd/pel For Dissection W And/or W/wo  Result Date: 09/10/2017 CLINICAL DATA:  Patient woke up with severe abdominal pain and nausea. History of renal insufficiency, hypertension, diabetes. EXAM: CT ANGIOGRAPHY CHEST, ABDOMEN AND PELVIS TECHNIQUE: Multidetector CT imaging through the chest, abdomen and pelvis was performed using the standard protocol during bolus administration of intravenous contrast. Multiplanar reconstructed images and MIPs were obtained and reviewed to evaluate the vascular anatomy. CONTRAST:  75 mL Isovue 350 COMPARISON:  None. FINDINGS: CTA CHEST FINDINGS Cardiovascular: Noncontrast images of the chest demonstrate postoperative changes consistent with previous sternotomy and coronary bypass grafts. Aortic calcification. No evidence of intramural hematoma. Calcified  granulomas in the spleen. Images obtained during arterial phase after intravenous contrast material was administered demonstrate normal caliber patent thoracic aorta and great vessel origins. No evidence of aortic aneurysm or dissection. Normal heart size. No pericardial effusion. Mediastinum/Nodes: No enlarged mediastinal, hilar, or axillary lymph nodes. Thyroid gland, trachea, and esophagus demonstrate no significant findings. Lungs/Pleura: Evaluation is limited due to motion artifact. Dependent atelectasis demonstrated in both lung bases. No pleural effusions. No pneumothorax. Airways are patent. Musculoskeletal: No chest wall abnormality. No acute or significant osseous findings. Review of the MIP images confirms the above findings. CTA ABDOMEN AND PELVIS FINDINGS VASCULAR Aorta: Normal caliber abdominal aorta. Diffuse calcification throughout the abdominal aorta. No aneurysm or dissection. Celiac: Celiac axis is patent with calcification demonstrated at the origin of the celiac trunk. Can't exclude some degree of stenosis. SMA: Superior mesenteric artery is patent with calcification at the origin and proximal and midportion. Can't exclude some degree of calcification. Renals: Single renal arteries bilaterally are patent. Scattered calcification throughout the renal arteries. IMA: Inferior  mesenteric artery is patent with dense calcification at the origin, likely resulting in stenosis. Inflow: Dense calcification of vessels is demonstrated although the vessels remain patent. Veins: No obvious venous abnormality within the limitations of this arterial phase study. Review of the MIP images confirms the above findings. NON-VASCULAR Hepatobiliary: Large area arterial phase enhancement in the junction of segments 4 and 5 of the liver measuring 6.2 cm diameter. This could represent an atypical hemangioma or more likely transient hepatic attenuation difference. Consider ultrasound for further evaluation to exclude a  focal mass lesion. Cholelithiasis with several stones in the gallbladder. No gallbladder wall thickening or edema. No bile duct dilatation. Pancreas: Pancreas is atrophic. Spleen: Normal in size without focal abnormality. Adrenals/Urinary Tract: Adrenal glands are unremarkable. Kidneys are normal, without renal calculi, focal lesion, or hydronephrosis. Bladder is unremarkable. Stomach/Bowel: Stomach, small bowel, and colon are not abnormally distended. Stool fills the colon. No inflammatory changes. Appendix is surgically absent. Lymphatic: No significant lymphadenopathy. Reproductive: Prostate gland is enlarged, measuring 5.3 cm diameter. Other: No free air or free fluid in the abdomen. Periumbilical hernia containing fat. Musculoskeletal: No acute or significant osseous findings. Review of the MIP images confirms the above findings. IMPRESSION: 1. No evidence of aneurysm or dissection of the thoracic or abdominal aorta. 2. Extensive aortic and major vessel calcifications. Visualized vessels appear patent but areas of nonocclusive stenosis are not excluded. Aortic atherosclerosis. 3. Cholelithiasis without evidence of cholecystitis. 4. Area of arterial phase enhancement in segments 4-5 of the liver likely represent transient hepatic attenuation difference. Consider ultrasound for further evaluation to exclude a focal mass lesion. 5. Prostate gland is enlarged. Electronically Signed   By: Burman Nieves M.D.   On: 09/10/2017 06:13    Microbiology: Recent Results (from the past 240 hour(s))  Urine Culture     Status: Abnormal   Collection Time: 09/11/17  1:14 PM  Result Value Ref Range Status   Specimen Description URINE, RANDOM  Final   Special Requests NONE  Final   Culture MULTIPLE SPECIES PRESENT, SUGGEST RECOLLECTION (A)  Final   Report Status 09/12/2017 FINAL  Final  Culture, blood (routine x 2)     Status: None   Collection Time: 09/11/17  1:46 PM  Result Value Ref Range Status   Specimen  Description BLOOD LEFT ANTECUBITAL  Final   Special Requests   Final    BOTTLES DRAWN AEROBIC ONLY Blood Culture adequate volume   Culture   Final    NO GROWTH 5 DAYS Performed at [facility], [address], [place], [place] [address]    Report Status 09/16/2017 FINAL  Final  Culture, blood (routine x 2)     Status: None   Collection Time: 09/11/17  1:58 PM  Result Value Ref Range Status   Specimen Description BLOOD LEFT ARM  Final   Special Requests   Final    BOTTLES DRAWN AEROBIC ONLY Blood Culture adequate volume   Culture   Final    NO GROWTH 5 DAYS Performed at [facility], [address], [place], [place] [address]    Report Status 09/16/2017 FINAL  Final  Surgical pcr screen     Status: None   Collection Time: 09/12/17 10:42 PM  Result Value Ref Range Status   MRSA, PCR NEGATIVE NEGATIVE Final   Staphylococcus aureus NEGATIVE NEGATIVE Final    Comment: (NOTE) The Xpert SA Assay (FDA approved for NASAL specimens in patients 33 years of age and older), is  one component of a comprehensive surveillance program. It is not intended to diagnose infection nor to guide or monitor treatment.      Labs: Basic Metabolic Panel:  Recent Labs Lab 09/12/17 0455 09/14/17 0430 09/15/17 0505  NA 134* 136 135  K 3.8 3.6 3.4*  CL 105 106 106  CO2 21* 20* 22  GLUCOSE 341* 297* 202*  BUN 43* 40* 35*  CREATININE 1.73* 1.72* 1.79*  CALCIUM 7.9* 8.1* 8.2*   Liver Function Tests:  Recent Labs Lab 09/12/17 0455  AST 23  ALT 18  ALKPHOS 73  BILITOT 0.8  PROT 5.8*  ALBUMIN 2.9*   No results for input(s): LIPASE, AMYLASE in the last 168 hours. No results for input(s): AMMONIA in the last 168 hours. CBC:  Recent Labs Lab 09/12/17 0455 09/14/17 0430 09/15/17 0505  WBC 14.4* 10.6* 11.4*  HGB 9.4* 9.3* 8.9*  HCT 27.8* 27.6* 26.0*  MCV 89.7 87.1 88.4  PLT 229 229 260   Cardiac Enzymes: No results for input(s): CKTOTAL, CKMB, CKMBINDEX, TROPONINI in the  last 168 hours. BNP: BNP (last 3 results) No results for input(s): BNP in the last 8760 hours.  ProBNP (last 3 results) No results for input(s): PROBNP in the last 8760 hours.  CBG:  Recent Labs Lab 09/14/17 0726 09/14/17 1150 09/14/17 1641 09/14/17 2250 09/15/17 0751  GLUCAP 371* 329* 254* 275* 185*       SignedZannie Cove MD.  [facility] 09/18/2017, 3:02 PM

## 2017-09-19 NOTE — Telephone Encounter (Signed)
Called and spoke with wife, Westley Hummer. Pt had emergency surgery to have his gallbladder removed last week, he is scheduled to have an MRI and MRA this Thurs and wanted to R/S, advsd her she can call [facility] and reschedule those and I will make Dr Everlena Cooper aware.

## 2017-09-20 ENCOUNTER — Other Ambulatory Visit: Payer: Medicare Other

## 2017-10-02 ENCOUNTER — Ambulatory Visit (INDEPENDENT_AMBULATORY_CARE_PROVIDER_SITE_OTHER): Payer: Medicare Other | Admitting: Podiatry

## 2017-10-02 ENCOUNTER — Encounter: Payer: Self-pay | Admitting: Podiatry

## 2017-10-02 DIAGNOSIS — M79676 Pain in unspecified toe(s): Secondary | ICD-10-CM

## 2017-10-02 DIAGNOSIS — L84 Corns and callosities: Secondary | ICD-10-CM | POA: Diagnosis not present

## 2017-10-02 DIAGNOSIS — E1151 Type 2 diabetes mellitus with diabetic peripheral angiopathy without gangrene: Secondary | ICD-10-CM

## 2017-10-02 DIAGNOSIS — I739 Peripheral vascular disease, unspecified: Secondary | ICD-10-CM | POA: Diagnosis not present

## 2017-10-02 DIAGNOSIS — B351 Tinea unguium: Secondary | ICD-10-CM

## 2017-10-02 NOTE — Patient Instructions (Signed)

## 2017-10-02 NOTE — Progress Notes (Signed)
Patient ID: Henry [name]., male   DOB: 1941/07/16, 76 y.o.   MRN: [id number]    Subjective: This patient presents today for scheduled visit complaining of toenails that are elongated and thickened and are uncomfortable whenwalking wearing shoesand herequeststoenail debridement.Also, patient is complaining of callouses that uncomfortable on the plantar aspect right and left feet  Objective: Orientated 3 DP pulses 2/4 bilaterally PT pulses 0/4 bilaterally Capillary reflex within normal limits bilaterally Sensation to 10 g monofilament wire intact 4/5 bilaterally Vibratory sensation intact bilaterally Ankle reflex equal and reactive bilaterally Plantar callus fifth MPJ bilaterally Bunionette bilaterally No open skin lesions bilaterally Atrophic skin bilaterally Absent hair growth bilaterally Shiny skin bilaterally The toenails are hypertrophic, incurvated, discolored, deformity tender direct palpation 6-10  Assessment: Symptomatic onychomycoses 6-10 Diabetic with a history of peripheral arterial disease Plantar callus sub-fifth MPJ, bilaterally Plantar callus heels bilaterally  Plan: Debridement toenails 6-10 mechanically and electrically without any bleeding Debrided plantar calluses 2without anybleeding  Patient is requesting diabetic shoes. I advised patient that his primary care physician will not give a certification for dispensing of diabetic shoes. I recommended that he contact his primary care physician and have him referred to an orthotist for diabetic shoes with custom insoles  Reappoint 61 days

## 2017-10-05 ENCOUNTER — Ambulatory Visit (INDEPENDENT_AMBULATORY_CARE_PROVIDER_SITE_OTHER): Payer: Medicare Other | Admitting: Endocrinology

## 2017-10-05 ENCOUNTER — Encounter: Payer: Self-pay | Admitting: Endocrinology

## 2017-10-05 VITALS — BP 118/60 | HR 79 | Wt 153.8 lb

## 2017-10-05 DIAGNOSIS — I25119 Atherosclerotic heart disease of native coronary artery with unspecified angina pectoris: Secondary | ICD-10-CM

## 2017-10-05 DIAGNOSIS — E1022 Type 1 diabetes mellitus with diabetic chronic kidney disease: Secondary | ICD-10-CM | POA: Diagnosis not present

## 2017-10-05 LAB — POCT GLYCOSYLATED HEMOGLOBIN (HGB A1C): Hemoglobin A1C: 9.7

## 2017-10-05 NOTE — Patient Instructions (Addendum)
check your blood sugar 4 times a day: before the 3 meals, and at bedtime.  also check if you have symptoms of your blood sugar being too high or too low.  please keep a record of the readings and bring it to your next appointment here.  You can write it on any piece of paper.  please call us sooner if your blood sugar goes below 70, or if you have a lot of readings over 200.  Also, please write on your record why you think it is higher or lower.   Please continue the same insulin: 42 units with breakfast (25 if you are going to be active), and 25 units with the evening meal.   Keep the sore on your left leg covered with antibiotic ointment and a bandaid.  Please come back for a follow-up appointment in 2 months.

## 2017-10-05 NOTE — Progress Notes (Signed)
Subjective:    Patient ID: Henry Less., male    DOB: [birth year], 76 y.o.   MRN: [id number]  HPI Pt returns for f/u of diabetes mellitus: DM type: 1 Dx'ed: 1961 Complications: renal insufficiency, CAD, PAD, and retinopathy.   Therapy: insulin since dx.  DKA: never.   Severe hypoglycemia: last episode was 2016.   Pancreatitis: never.  Other: after poor results with multiple daily injections, he was changed to 70/30; he has declined pump and continuous glucose monitor; pt says he cannot afford insulin analogs.  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  Interval history: cbg's continue to be variable. Pt says elev cbg's are due to recent recovery from recent GB and hernia surgery.  Since then, cbg's are much lower.  He brings a record of his cbg's which I have reviewed today.  It varies from 65-310.  There is no trend throughout the day.   Past Medical History:  Diagnosis Date  . ANEMIA-NOS 07/10/2007  . CORONARY ARTERY DISEASE 07/10/2007  . DIABETES MELLITUS, TYPE I 07/10/2007  . HYPERLIPIDEMIA 07/10/2007  . HYPERTENSION 07/10/2007  . Peripheral arterial disease (HCC)   . 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 RENAL INSUFFICIENCY 07/10/2007    Past Surgical History:  Procedure Laterality Date  . APPENDECTOMY    . CARDIAC CATHETERIZATION     stenting in the vein graft to the right coronary artery and had_DES 3x 26 mm Integrity stent inserted, post dilated to 3.4 mm  . CAROTID ENDARTERECTOMY Right    right  . CHOLECYSTECTOMY N/A 09/13/2017   Procedure: LAPAROSCOPIC CHOLECYSTECTOMY;  Surgeon: Kinsinger, De Blanch, MD;  Location: [facility];  Service: General;  Laterality: N/A;  . CORONARY ARTERY BYPASS GRAFT  02/2010   PTCA of his right coroary artery.   . ESOPHAGOGASTRODUODENOSCOPY N/A 09/11/2017   Procedure: ESOPHAGOGASTRODUODENOSCOPY (EGD);  Surgeon: Hilarie Fredrickson, MD;  Location: [facility];  Service: Endoscopy;  Laterality: N/A;     Social History   Social History  . Marital status: Married    Spouse name: N/A  . Number of children: N/A  . Years of education: BS   Occupational History  . retired     14 yrs ago   Social History Main Topics  . Smoking status: Former Games developer  . Smokeless tobacco: Never Used  . Alcohol use No  . Drug use: No  . Sexual activity: Not on file   Other Topics Concern  . Not on file   Social History Narrative   Says his diet and exercise are very good   Married lives with wife    Current Outpatient Prescriptions on File Prior to Visit  Medication Sig Dispense Refill  . acetaminophen (TYLENOL) 500 MG tablet Take 500 mg by mouth daily as needed (pain).    Marland Kitchen aspirin EC 81 MG tablet Take 81 mg by mouth daily.    . B Complex-C-Folic Acid (FOLBEE PLUS) TABS Take 1 tablet by mouth daily.      . Cholecalciferol (VITAMIN D) 2000 UNITS CAPS Take 2,000 Units by mouth daily.     Marland Kitchen gabapentin (NEURONTIN) 100 MG capsule Take 100 mg by mouth 2 (two) times daily.     . hydrochlorothiazide (MICROZIDE) 12.5 MG capsule Take 12.5 mg by mouth daily.    Marland Kitchen HYDROcodone-acetaminophen (NORCO) 5-325 MG tablet Take 1 tablet by mouth every 6 (six) hours as needed for moderate pain. 20 tablet 0  . insulin NPH-regular Human (NOVOLIN 70/30) (70-30) 100  UNIT/ML injection 42 units with breakfast and 25 units with supper 20 mL 11  . isosorbide mononitrate (IMDUR) 60 MG 24 hr tablet TAKE 1 TABLET BY MOUTH DAILY 30 tablet 6  . labetalol (NORMODYNE) 200 MG tablet TAKE 1 TABLET(200 MG) BY MOUTH TWICE DAILY 60 tablet 5  . omeprazole (PRILOSEC OTC) 20 MG tablet Take 20 mg by mouth daily.    . polyethylene glycol (MIRALAX / GLYCOLAX) packet Take 17 g by mouth daily as needed. 5 each 0   No current facility-administered medications on file prior to visit.     Allergies  Allergen Reactions  . Hydralazine Swelling    Feet swelled so bad they were cracking Severe foot swelling  . Penicillins Other (See Comments)     Per allergy test.  . Amlodipine Swelling  . Amlodipine Besylate Swelling  . Codeine Other (See Comments)    anxiety  . Contrast Media [Iodinated Diagnostic Agents]     Kidney issues when given  . Lipitor [Atorvastatin Calcium] Other (See Comments)    myalgia  . Valsartan Other (See Comments) and Swelling    Caused kidney damage  . Zetia [Ezetimibe] Other (See Comments)    Severe dizziness  . Penicillin G Rash    Family History  Problem Relation Age of Onset  . Heart disease Mother   . Cancer Neg Hx     BP 118/60   Pulse 79   Wt 153 lb 12.8 oz (69.8 kg)   SpO2 98%   BMI 22.07 kg/m    Review of Systems Denies LOC.      Objective:   Physical Exam VITAL SIGNS:  See vs page.  GENERAL: no distress.  Pulses: dorsalis pedis intact bilat.  Skin: feet are of normal temp. there is a 1 cm ulcer at the left leg. There is patchy hyperpigmentation. There is an old healed vein harvest scar at the left leg.   Feet: no deformity. There is bilateral onychomycosis.  CV: no leg edema.  Neuro: sensation is intact to touch on the feet, but decreased from normal.     Lab Results  Component Value Date   HGBA1C 9.7 10/05/2017       Assessment & Plan:  Leg ulcer: improved.  Cholecystitis: this appears to have affected a1c.  Type 1 DM, with PAD: glycemic control is apparently improved.   Patient Instructions  check your blood sugar 4 times a day: before the 3 meals, and at bedtime.  also check if you have symptoms of your blood sugar being too high or too low.  please keep a record of the readings and bring it to your next appointment here.  You can write it on any piece of paper.  please call us sooner if your blood sugar goes below 70, or if you have a lot of readings over 200.  Also, please write on your record why you think it is higher or lower.   Please continue the same insulin: 42 units with breakfast (25 if you are going to be active), and 25 units with the evening meal.    Keep the sore on your left leg covered with antibiotic ointment and a bandaid.  Please come back for a follow-up appointment in 2 months.

## 2017-10-25 ENCOUNTER — Ambulatory Visit
Admission: RE | Admit: 2017-10-25 | Discharge: 2017-10-25 | Disposition: A | Discharge status: Home / Self Care | Payer: Medicare Other | Source: Ambulatory Visit | Attending: Neurology | Admitting: Neurology

## 2017-10-25 DIAGNOSIS — R413 Other amnesia: Secondary | ICD-10-CM

## 2017-10-25 DIAGNOSIS — R42 Dizziness and giddiness: Secondary | ICD-10-CM

## 2017-10-25 DIAGNOSIS — R251 Tremor, unspecified: Secondary | ICD-10-CM | POA: Diagnosis not present

## 2017-10-25 DIAGNOSIS — I720 Aneurysm of carotid artery: Secondary | ICD-10-CM

## 2017-10-25 DIAGNOSIS — R404 Transient alteration of awareness: Secondary | ICD-10-CM

## 2017-10-25 DIAGNOSIS — H53461 Homonymous bilateral field defects, right side: Secondary | ICD-10-CM

## 2017-10-25 DIAGNOSIS — R2681 Unsteadiness on feet: Secondary | ICD-10-CM

## 2017-10-25 DIAGNOSIS — I6523 Occlusion and stenosis of bilateral carotid arteries: Secondary | ICD-10-CM | POA: Diagnosis not present

## 2017-10-25 MED ORDER — GADOBENATE DIMEGLUMINE 529 MG/ML IV SOLN
7.0000 mL | Freq: Once | INTRAVENOUS | Status: AC | PRN
  Administered 2017-10-25: 7 mL via INTRAVENOUS

## 2017-10-31 ENCOUNTER — Telehealth: Payer: Self-pay | Admitting: Neurology

## 2017-10-31 ENCOUNTER — Telehealth: Payer: Self-pay

## 2017-10-31 NOTE — Telephone Encounter (Signed)
Patient was returning your call. He said he will be available after 12:00 PM. Thanks

## 2017-10-31 NOTE — Telephone Encounter (Signed)
-----   Message from Drema DallasAdam R Jaffe, DO sent at 10/29/2017  2:45 PM EST ----- MRI shows atherosclerosis of the arteries in the head and neck.  There is nothing that explains his symptoms.  Recommend to continue aspirin and reconsider restarting a cholesterol-lowering medication (statin)

## 2017-10-31 NOTE — Telephone Encounter (Signed)
Called and spoke with Pt, advsd him of MRI results and recommendation to continue.ASA and consider restarting statin. Pt doesn't want to take statin, will discuss with cardiologist.

## 2017-11-02 DIAGNOSIS — I10 Essential (primary) hypertension: Secondary | ICD-10-CM | POA: Diagnosis not present

## 2017-11-02 DIAGNOSIS — N183 Chronic kidney disease, stage 3 (moderate): Secondary | ICD-10-CM | POA: Diagnosis not present

## 2017-11-08 DIAGNOSIS — N390 Urinary tract infection, site not specified: Secondary | ICD-10-CM | POA: Diagnosis not present

## 2017-11-08 DIAGNOSIS — E559 Vitamin D deficiency, unspecified: Secondary | ICD-10-CM | POA: Diagnosis not present

## 2017-11-08 DIAGNOSIS — I10 Essential (primary) hypertension: Secondary | ICD-10-CM | POA: Diagnosis not present

## 2017-11-08 DIAGNOSIS — N183 Chronic kidney disease, stage 3 (moderate): Secondary | ICD-10-CM | POA: Diagnosis not present

## 2017-11-09 ENCOUNTER — Ambulatory Visit: Payer: Medicare Other | Admitting: Neurology

## 2017-11-23 ENCOUNTER — Other Ambulatory Visit: Payer: Self-pay | Admitting: Cardiovascular Disease

## 2017-12-03 ENCOUNTER — Ambulatory Visit: Payer: Medicare Other | Admitting: Podiatry

## 2017-12-04 ENCOUNTER — Ambulatory Visit: Payer: Medicare Other | Admitting: Podiatry

## 2017-12-05 ENCOUNTER — Encounter: Payer: Self-pay | Admitting: Endocrinology

## 2017-12-05 ENCOUNTER — Ambulatory Visit (INDEPENDENT_AMBULATORY_CARE_PROVIDER_SITE_OTHER): Payer: Medicare Other | Admitting: Endocrinology

## 2017-12-05 VITALS — BP 144/82 | HR 72 | Ht 70.0 in | Wt 161.0 lb

## 2017-12-05 DIAGNOSIS — Z23 Encounter for immunization: Secondary | ICD-10-CM | POA: Diagnosis not present

## 2017-12-05 DIAGNOSIS — Z0000 Encounter for general adult medical examination without abnormal findings: Secondary | ICD-10-CM

## 2017-12-05 DIAGNOSIS — L97921 Non-pressure chronic ulcer of unspecified part of left lower leg limited to breakdown of skin: Secondary | ICD-10-CM

## 2017-12-05 DIAGNOSIS — E1029 Type 1 diabetes mellitus with other diabetic kidney complication: Secondary | ICD-10-CM

## 2017-12-05 DIAGNOSIS — I25119 Atherosclerotic heart disease of native coronary artery with unspecified angina pectoris: Secondary | ICD-10-CM

## 2017-12-05 DIAGNOSIS — IMO0002 Reserved for concepts with insufficient information to code with codable children: Secondary | ICD-10-CM

## 2017-12-05 DIAGNOSIS — E1065 Type 1 diabetes mellitus with hyperglycemia: Secondary | ICD-10-CM | POA: Diagnosis not present

## 2017-12-05 DIAGNOSIS — L97909 Non-pressure chronic ulcer of unspecified part of unspecified lower leg with unspecified severity: Secondary | ICD-10-CM | POA: Insufficient documentation

## 2017-12-05 LAB — POCT GLYCOSYLATED HEMOGLOBIN (HGB A1C): Hemoglobin A1C: 9.5

## 2017-12-05 NOTE — Patient Instructions (Addendum)
Please see a wound care specialist.  you will receive a phone call, about a day and time for an appointment. Please continue the same insulin for now Please consider these measures for your health:  minimize alcohol.  Do not use tobacco products.  Have a colonoscopy at least every 10 years from age 76.  Keep firearms safely stored.  Always use seat belts.  have working smoke alarms in your home.  See an eye doctor and dentist regularly.  Never drive under the influence of alcohol or drugs (including prescription drugs).  Those with fair skin should take precautions against the sun, and should carefully examine their skin once per month, for any new or changed moles. It is critically important to prevent falling down (keep floor areas well-lit, dry, and free of loose objects.  If you have a cane, walker, or wheelchair, you should use it, even for short trips around the house.  Wear flat-soled shoes.  Also, try not to rush). Please come back for a follow-up appointment in 3 months

## 2017-12-05 NOTE — Progress Notes (Signed)
Subjective:    Patient ID: Henry [name]., male    DOB: [birth year], 76 y.o.   MRN: [id number]  HPI Pt returns for f/u of diabetes mellitus: DM type: 1 Dx'ed: 1961 Complications: renal insufficiency, CAD, PAD, and retinopathy.   Therapy: insulin since dx.  DKA: never.   Severe hypoglycemia: last episode was 2016.   Pancreatitis: never.  Other: after poor results with multiple daily injections, he was changed to 70/30; he has declined pump and continuous glucose monitor; pt says he cannot afford insulin analogs.  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  Interval history: He brings a record of his cbg's which I have reviewed today.  It varies from 49-526.  There is no trend throughout the day.  pt states he feels well in general. Past Medical History:  Diagnosis Date  . ANEMIA-NOS 07/10/2007  . CORONARY ARTERY DISEASE 07/10/2007  . DIABETES MELLITUS, TYPE I 07/10/2007  . HYPERLIPIDEMIA 07/10/2007  . HYPERTENSION 07/10/2007  . Peripheral arterial disease (HCC)   . 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 RENAL INSUFFICIENCY 07/10/2007    Past Surgical History:  Procedure Laterality Date  . APPENDECTOMY    . CARDIAC CATHETERIZATION     stenting in the vein graft to the right coronary artery and had_DES 3x 26 mm Integrity stent inserted, post dilated to 3.4 mm  . CAROTID ENDARTERECTOMY Right    right  . CHOLECYSTECTOMY N/A 09/13/2017   Procedure: LAPAROSCOPIC CHOLECYSTECTOMY;  Surgeon: Kinsinger, De Blanch, MD;  Location: [facility];  Service: General;  Laterality: N/A;  . CORONARY ARTERY BYPASS GRAFT  02/2010   PTCA of his right coroary artery.   . ESOPHAGOGASTRODUODENOSCOPY N/A 09/11/2017   Procedure: ESOPHAGOGASTRODUODENOSCOPY (EGD);  Surgeon: Hilarie Fredrickson, MD;  Location: [facility];  Service: Endoscopy;  Laterality: N/A;    Social History   Socioeconomic History  . Marital status: Married    Spouse name: Not on file  . Number of  children: Not on file  . Years of education: BS  . Highest education level: Not on file  Social Needs  . Financial resource strain: Not on file  . Food insecurity - worry: Not on file  . Food insecurity - inability: Not on file  . Transportation needs - medical: Not on file  . Transportation needs - non-medical: Not on file  Occupational History  . Occupation: retired    Comment: 14 yrs ago  Tobacco Use  . Smoking status: Former Games developer  . Smokeless tobacco: Never Used  Substance and Sexual Activity  . Alcohol use: No  . Drug use: No  . Sexual activity: Not on file  Other Topics Concern  . Not on file  Social History Narrative   Says his diet and exercise are very good   Married lives with wife    Current Outpatient Medications on File Prior to Visit  Medication Sig Dispense Refill  . acetaminophen (TYLENOL) 500 MG tablet Take 500 mg by mouth daily as needed (pain).    Marland Kitchen aspirin EC 81 MG tablet Take 81 mg by mouth daily.    . B Complex-C-Folic Acid (FOLBEE PLUS) TABS Take 1 tablet by mouth daily.      . Cholecalciferol (VITAMIN D) 2000 UNITS CAPS Take 2,000 Units by mouth daily.     Marland Kitchen gabapentin (NEURONTIN) 100 MG capsule Take 100 mg by mouth 2 (two) times daily.     . hydrochlorothiazide (MICROZIDE) 12.5 MG capsule Take 12.5 mg by  mouth daily.    . insulin NPH-regular Human (NOVOLIN 70/30) (70-30) 100 UNIT/ML injection 42 units with breakfast and 25 units with supper 20 mL 11  . isosorbide mononitrate (IMDUR) 60 MG 24 hr tablet TAKE 1 TABLET BY MOUTH DAILY 30 tablet 0  . labetalol (NORMODYNE) 200 MG tablet TAKE 1 TABLET(200 MG) BY MOUTH TWICE DAILY 60 tablet 5  . omeprazole (PRILOSEC OTC) 20 MG tablet Take 20 mg by mouth daily.    Marland Kitchen. HYDROcodone-acetaminophen (NORCO) 5-325 MG tablet Take 1 tablet by mouth every 6 (six) hours as needed for moderate pain. (Patient not taking: Reported on 12/05/2017) 20 tablet 0  . polyethylene glycol (MIRALAX / GLYCOLAX) packet Take 17 g by mouth  daily as needed. (Patient not taking: Reported on 12/05/2017) 5 each 0   No current facility-administered medications on file prior to visit.     Allergies  Allergen Reactions  . Hydralazine Swelling    Feet swelled so bad they were cracking Severe foot swelling  . Penicillins Other (See Comments)    Per allergy test.  . Amlodipine Swelling  . Amlodipine Besylate Swelling  . Codeine Other (See Comments)    anxiety  . Contrast Media [Iodinated Diagnostic Agents]     Kidney issues when given  . Lipitor [Atorvastatin Calcium] Other (See Comments)    myalgia  . Valsartan Other (See Comments) and Swelling    Caused kidney damage  . Zetia [Ezetimibe] Other (See Comments)    Severe dizziness  . Penicillin G Rash    Family History  Problem Relation Age of Onset  . Heart disease Mother   . Cancer Neg Hx     BP (!) 144/82   Pulse 72   Ht 5\' 10"  (1.778 m)   Wt 161 lb (73 kg)   SpO2 98%   BMI 23.10 kg/m    Review of Systems Left leg ulcer persists.     Objective:   Physical Exam VITAL SIGNS:  See vs page.  GENERAL: no distress.  Pulses: dorsalis pedis absent bilat.  Skin: feet are of normal temp. there is a 2 cm ulcer at the left leg. There is patchy hyperpigmentation. There is an old healed vein harvest scar at the left leg.   Feet: no deformity. There is bilateral onychomycosis.  CV: no leg edema.  Neuro: sensation is intact to touch on the feet, but severely decreased from normal.    A1c=9.5%    Assessment & Plan:  Type 1 DM: therapy limited by variable cbg's.  I again advised pump, but he declines.  I also advised tresiba and low-dose qac insulin, but he declines this, also.      Subjective:   Patient here for Medicare annual wellness visit and management of other chronic and acute problems.     Risk factors: advanced age    Roster of Physicians Providing Medical Care to Patient:  See "snapshot"   Activities of Daily Living: In your present state of  health, do you have any difficulty performing the following activities (lives with wife)?:  Preparing food and eating?: No  Bathing yourself: No  Getting dressed: No  Using the toilet:No  Moving around from place to place: No  In the past year have you fallen or had a near fall?: No    Home Safety: Has smoke detector and wears seat belts. No firearms. No excess sun exposure.  Opioid Use: none   Diet and Exercise  Current exercise habits: pt says good Dietary  issues discussed: pt reports a healthy diet   Depression Screen  Q1: Over the past two weeks, have you felt down, depressed or hopeless? no  Q2: Over the past two weeks, have you felt little interest or pleasure in doing things? no   The following portions of the patient's history were reviewed and updated as appropriate: allergies, current medications, past family history, past medical history, past social history, past surgical history and problem list.   Review of Systems  Denies hearing loss, and visual loss Objective:   Vision:  [place] opthalmologist, so he declines [facility] today.   Hearing: grossly normal Body mass index:  See vs page Msk: pt easily and quickly performs "get-up-and-go" from a sitting position Cognitive Impairment Assessment: cognition, memory and judgment appear normal.  remembers 3/3 at 5 minutes.  excellent recall.  can easily read and write a sentence.  alert and oriented x 3.    Assessment:   Medicare wellness utd on preventive parameters    Plan:   During the course of the visit the patient was educated and counseled about appropriate screening and preventive services including:        Fall prevention is advised today  Screening mammography  Bone densitometry screening  Diabetes screening  Nutrition counseling is offered  advanced directives/end of life addressed today:  see healthcare directives hyperlink  Vaccines are updated as needed  Patient Instructions (the written plan) was given to  the patient.

## 2017-12-05 NOTE — Progress Notes (Signed)
we discussed code status.  pt requests full code, but would not want to be started or maintained on artificial life-support measures if there was not a reasonable chance of recovery 

## 2017-12-10 ENCOUNTER — Other Ambulatory Visit: Payer: Self-pay | Admitting: Cardiovascular Disease

## 2017-12-10 NOTE — Telephone Encounter (Signed)
REFILL 

## 2017-12-24 ENCOUNTER — Other Ambulatory Visit: Payer: Self-pay | Admitting: Cardiovascular Disease

## 2017-12-31 ENCOUNTER — Encounter: Payer: Self-pay | Admitting: Sports Medicine

## 2017-12-31 ENCOUNTER — Ambulatory Visit (INDEPENDENT_AMBULATORY_CARE_PROVIDER_SITE_OTHER): Payer: Medicare Other | Admitting: Sports Medicine

## 2017-12-31 DIAGNOSIS — E1151 Type 2 diabetes mellitus with diabetic peripheral angiopathy without gangrene: Secondary | ICD-10-CM | POA: Diagnosis not present

## 2017-12-31 DIAGNOSIS — M79676 Pain in unspecified toe(s): Secondary | ICD-10-CM

## 2017-12-31 DIAGNOSIS — L84 Corns and callosities: Secondary | ICD-10-CM | POA: Diagnosis not present

## 2017-12-31 DIAGNOSIS — B351 Tinea unguium: Secondary | ICD-10-CM | POA: Diagnosis not present

## 2017-12-31 NOTE — Progress Notes (Signed)
Subjective: Henry [name]. is a 77 y.o. male patient with history of diabetes who presents to office today complaining of long, painful nails  while ambulating in shoes; unable to trim. Patient states that the glucose reading this morning was 221 mg/dl; Always high because "brittle diabetic" and sugar is hard to control, A1c always 8-9. Patient denies any new changes in medication or new problems. Patient denies any new cramping, numbness, burning or tingling in the legs.  Saw PCP in Dec and will see endocrinologist in March  Patient Active Problem List   Diagnosis Date Noted  . Leg ulcer ([place]) 12/05/2017  . 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 Diabetic gastroparesis ([place])   . Abdominal pain 09/10/2017  . Facial problem 08/06/2017  . CKD (chronic kidney disease), stage III ([place]) 05/17/2017  . Transient alteration of awareness 05/17/2017  . Hypoglycemia due to insulin 05/17/2017  . Peripheral arterial disease ([place]) 11/04/2014  . Claudication ([place]) 09/24/2014  . Premature ventricular contractions (PVCs) ([place]) 04/06/2014  . NSVT (nonsustained ventricular tachycardia) ([place]) 04/06/2014  . Diabetic peripheral neuropathy ([place]) 02/04/2014  . Dizziness 07/10/2013  . Diabetes ([place]) 06/13/2013  . Uncontrolled type 1 diabetes mellitus with renal manifestations ([place]) 02/16/2012  . Screening for prostate cancer 05/12/2011  . Pure hypercholesterolemia 05/12/2011  . Encounter for long-term (current) use of other medications 05/12/2011  . 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 PROLIFERATIVE DIABETIC RETINOPATHY 05/05/2010  . Dyslipidemia 07/10/2007  . Anemia 07/10/2007  . Essential hypertension 07/10/2007  . Coronary atherosclerosis 07/10/2007  . Disorder resulting from impaired renal function 07/10/2007   Current Outpatient Medications on File Prior to Visit  Medication Sig Dispense Refill  . acetaminophen (TYLENOL) 500 MG tablet Take 500 mg by mouth daily as  needed (pain).    Marland Kitchen aspirin EC 81 MG tablet Take 81 mg by mouth daily.    . B Complex-C-Folic Acid (FOLBEE PLUS) TABS Take 1 tablet by mouth daily.      . Cholecalciferol (VITAMIN D) 2000 UNITS CAPS Take 2,000 Units by mouth daily.     Marland Kitchen gabapentin (NEURONTIN) 100 MG capsule Take 100 mg by mouth 2 (two) times daily.     . hydrochlorothiazide (MICROZIDE) 12.5 MG capsule Take 12.5 mg by mouth daily.    Marland Kitchen HYDROcodone-acetaminophen (NORCO) 5-325 MG tablet Take 1 tablet by mouth every 6 (six) hours as needed for moderate pain. (Patient not taking: Reported on 12/05/2017) 20 tablet 0  . insulin NPH-regular Human (NOVOLIN 70/30) (70-30) 100 UNIT/ML injection 42 units with breakfast and 25 units with supper 20 mL 11  . isosorbide mononitrate (IMDUR) 60 MG 24 hr tablet TAKE 1 TABLET BY MOUTH DAILY 30 tablet 0  . labetalol (NORMODYNE) 200 MG tablet Take 1 tablet (200 mg total) by mouth 2 (two) times daily. NEED OV. 60 tablet 0  . omeprazole (PRILOSEC OTC) 20 MG tablet Take 20 mg by mouth daily.    . polyethylene glycol (MIRALAX / GLYCOLAX) packet Take 17 g by mouth daily as needed. (Patient not taking: Reported on 12/05/2017) 5 each 0   No current facility-administered medications on file prior to visit.    Allergies  Allergen Reactions  . Hydralazine Swelling    Feet swelled so bad they were cracking Severe foot swelling  . Penicillins Other (See Comments)    Per allergy test.  . Amlodipine Swelling  . Amlodipine Besylate Swelling  . Codeine Other (See Comments)    anxiety  . Contrast  Media [Iodinated Diagnostic Agents]     Kidney issues when given  . Lipitor [Atorvastatin Calcium] Other (See Comments)    myalgia  . Valsartan Other (See Comments) and Swelling    Caused kidney damage  . Zetia [Ezetimibe] Other (See Comments)    Severe dizziness  . Penicillin G Rash    Recent Results (from the past 2160 hour(s))  POCT HgB A1C     Status: None   Collection Time: 10/05/17  9:50 AM  Result  Value Ref Range   Hemoglobin A1C 9.7   POCT glycosylated hemoglobin (Hb A1C)     Status: None   Collection Time: 12/05/17 10:42 AM  Result Value Ref Range   Hemoglobin A1C 9.5     Objective: General: Patient is awake, alert, and oriented x 3 and in no acute distress.  Integument: Skin is warm, dry and supple bilateral. Nails are tender, long, thickened and  dystrophic with subungual debris, consistent with onychomycosis, 1-5 bilateral. No signs of infection. + callus sub met 5 bilateral. Remaining integument unremarkable.  Vasculature:  Dorsalis Pedis pulse 1/4 bilateral. Posterior Tibial pulse  0/4 bilateral.  Capillary fill time <3 sec 1-5 bilateral. No hair growth to the level of the digits. Temperature gradient within normal limits. + varicosities present bilateral. Trace edema present bilateral.   Neurology: The patient has intact sensation measured with a 5.07/10g Semmes Weinstein Monofilament at all pedal sites bilateral . Vibratory sensation diminished bilateral with tuning fork. No Babinski sign present bilateral.   Musculoskeletal: Asymptomatic tailors bunion pedal deformities noted bilateral. Muscular strength 5/5 in all lower extremity muscular groups bilateral without pain on range of motion. No tenderness with calf compression bilateral.  Assessment and Plan: Problem List Items Addressed This Visit    None    Visit Diagnoses    Dermatophytosis of nail    -  Primary   Plantar callus       Pain of toe, unspecified laterality       Diabetic peripheral vascular disorder ([place])          -Examined patient. -Discussed and educated patient on diabetic foot care, especially with  regards to the vascular, neurological and musculoskeletal systems.  -Stressed the importance of good glycemic control and the detriment of not  controlling glucose levels in relation to the foot. -Mechanically debrided callus x 2 using sterile chisel blade and all nails 1-5 bilateral using sterile  nail nipper and filed with dremel without incident  -Recommend elevation when sitting for edema control and daily skin emollients for callus skin  -Answered all patient questions -Patient to return in 62 days for at risk foot care -Patient advised to call the office if any problems or questions arise in the meantime.  Landis Martins, DPM

## 2018-01-16 ENCOUNTER — Other Ambulatory Visit: Payer: Self-pay | Admitting: Cardiovascular Disease

## 2018-01-16 NOTE — Telephone Encounter (Signed)
Rx has been sent to the pharmacy electronically. ° °

## 2018-01-17 ENCOUNTER — Encounter (HOSPITAL_BASED_OUTPATIENT_CLINIC_OR_DEPARTMENT_OTHER): Payer: Medicare Other | Attending: Internal Medicine

## 2018-01-17 DIAGNOSIS — Z951 Presence of aortocoronary bypass graft: Secondary | ICD-10-CM | POA: Insufficient documentation

## 2018-01-17 DIAGNOSIS — I87312 Chronic venous hypertension (idiopathic) with ulcer of left lower extremity: Secondary | ICD-10-CM | POA: Insufficient documentation

## 2018-01-17 DIAGNOSIS — E1022 Type 1 diabetes mellitus with diabetic chronic kidney disease: Secondary | ICD-10-CM | POA: Diagnosis not present

## 2018-01-17 DIAGNOSIS — N186 End stage renal disease: Secondary | ICD-10-CM | POA: Insufficient documentation

## 2018-01-17 DIAGNOSIS — E10622 Type 1 diabetes mellitus with other skin ulcer: Secondary | ICD-10-CM | POA: Diagnosis not present

## 2018-01-17 DIAGNOSIS — E1042 Type 1 diabetes mellitus with diabetic polyneuropathy: Secondary | ICD-10-CM | POA: Diagnosis not present

## 2018-01-17 DIAGNOSIS — I2510 Atherosclerotic heart disease of native coronary artery without angina pectoris: Secondary | ICD-10-CM | POA: Insufficient documentation

## 2018-01-17 DIAGNOSIS — L97821 Non-pressure chronic ulcer of other part of left lower leg limited to breakdown of skin: Secondary | ICD-10-CM | POA: Insufficient documentation

## 2018-01-17 DIAGNOSIS — E1051 Type 1 diabetes mellitus with diabetic peripheral angiopathy without gangrene: Secondary | ICD-10-CM | POA: Diagnosis not present

## 2018-01-17 DIAGNOSIS — L97822 Non-pressure chronic ulcer of other part of left lower leg with fat layer exposed: Secondary | ICD-10-CM | POA: Diagnosis not present

## 2018-01-17 DIAGNOSIS — E10319 Type 1 diabetes mellitus with unspecified diabetic retinopathy without macular edema: Secondary | ICD-10-CM | POA: Insufficient documentation

## 2018-01-24 DIAGNOSIS — E1051 Type 1 diabetes mellitus with diabetic peripheral angiopathy without gangrene: Secondary | ICD-10-CM | POA: Diagnosis not present

## 2018-01-24 DIAGNOSIS — E1022 Type 1 diabetes mellitus with diabetic chronic kidney disease: Secondary | ICD-10-CM | POA: Diagnosis not present

## 2018-01-24 DIAGNOSIS — E10622 Type 1 diabetes mellitus with other skin ulcer: Secondary | ICD-10-CM | POA: Diagnosis not present

## 2018-01-24 DIAGNOSIS — I87312 Chronic venous hypertension (idiopathic) with ulcer of left lower extremity: Secondary | ICD-10-CM | POA: Diagnosis not present

## 2018-01-24 DIAGNOSIS — L97822 Non-pressure chronic ulcer of other part of left lower leg with fat layer exposed: Secondary | ICD-10-CM | POA: Diagnosis not present

## 2018-01-24 DIAGNOSIS — L97821 Non-pressure chronic ulcer of other part of left lower leg limited to breakdown of skin: Secondary | ICD-10-CM | POA: Diagnosis not present

## 2018-01-24 DIAGNOSIS — E1042 Type 1 diabetes mellitus with diabetic polyneuropathy: Secondary | ICD-10-CM | POA: Diagnosis not present

## 2018-01-31 ENCOUNTER — Encounter (HOSPITAL_BASED_OUTPATIENT_CLINIC_OR_DEPARTMENT_OTHER): Payer: Medicare Other | Attending: Internal Medicine

## 2018-01-31 DIAGNOSIS — E1022 Type 1 diabetes mellitus with diabetic chronic kidney disease: Secondary | ICD-10-CM | POA: Insufficient documentation

## 2018-01-31 DIAGNOSIS — L97822 Non-pressure chronic ulcer of other part of left lower leg with fat layer exposed: Secondary | ICD-10-CM | POA: Insufficient documentation

## 2018-01-31 DIAGNOSIS — N186 End stage renal disease: Secondary | ICD-10-CM | POA: Diagnosis not present

## 2018-01-31 DIAGNOSIS — Z961 Presence of intraocular lens: Secondary | ICD-10-CM | POA: Diagnosis not present

## 2018-01-31 DIAGNOSIS — I87312 Chronic venous hypertension (idiopathic) with ulcer of left lower extremity: Secondary | ICD-10-CM | POA: Diagnosis not present

## 2018-01-31 DIAGNOSIS — E1042 Type 1 diabetes mellitus with diabetic polyneuropathy: Secondary | ICD-10-CM | POA: Diagnosis not present

## 2018-01-31 DIAGNOSIS — E113513 Type 2 diabetes mellitus with proliferative diabetic retinopathy with macular edema, bilateral: Secondary | ICD-10-CM | POA: Diagnosis not present

## 2018-01-31 DIAGNOSIS — E11622 Type 2 diabetes mellitus with other skin ulcer: Secondary | ICD-10-CM | POA: Diagnosis not present

## 2018-01-31 DIAGNOSIS — I2510 Atherosclerotic heart disease of native coronary artery without angina pectoris: Secondary | ICD-10-CM | POA: Insufficient documentation

## 2018-01-31 DIAGNOSIS — E10622 Type 1 diabetes mellitus with other skin ulcer: Secondary | ICD-10-CM | POA: Diagnosis not present

## 2018-01-31 DIAGNOSIS — E1051 Type 1 diabetes mellitus with diabetic peripheral angiopathy without gangrene: Secondary | ICD-10-CM | POA: Insufficient documentation

## 2018-01-31 DIAGNOSIS — Z794 Long term (current) use of insulin: Secondary | ICD-10-CM | POA: Diagnosis not present

## 2018-02-07 DIAGNOSIS — E10622 Type 1 diabetes mellitus with other skin ulcer: Secondary | ICD-10-CM | POA: Diagnosis not present

## 2018-02-07 DIAGNOSIS — L97822 Non-pressure chronic ulcer of other part of left lower leg with fat layer exposed: Secondary | ICD-10-CM | POA: Diagnosis not present

## 2018-02-07 DIAGNOSIS — E1042 Type 1 diabetes mellitus with diabetic polyneuropathy: Secondary | ICD-10-CM | POA: Diagnosis not present

## 2018-02-07 DIAGNOSIS — E1022 Type 1 diabetes mellitus with diabetic chronic kidney disease: Secondary | ICD-10-CM | POA: Diagnosis not present

## 2018-02-07 DIAGNOSIS — I87312 Chronic venous hypertension (idiopathic) with ulcer of left lower extremity: Secondary | ICD-10-CM | POA: Diagnosis not present

## 2018-02-07 DIAGNOSIS — E1051 Type 1 diabetes mellitus with diabetic peripheral angiopathy without gangrene: Secondary | ICD-10-CM | POA: Diagnosis not present

## 2018-02-11 ENCOUNTER — Other Ambulatory Visit: Payer: Self-pay | Admitting: Internal Medicine

## 2018-02-11 DIAGNOSIS — L97922 Non-pressure chronic ulcer of unspecified part of left lower leg with fat layer exposed: Secondary | ICD-10-CM

## 2018-02-14 DIAGNOSIS — E10622 Type 1 diabetes mellitus with other skin ulcer: Secondary | ICD-10-CM | POA: Diagnosis not present

## 2018-02-14 DIAGNOSIS — E1051 Type 1 diabetes mellitus with diabetic peripheral angiopathy without gangrene: Secondary | ICD-10-CM | POA: Diagnosis not present

## 2018-02-14 DIAGNOSIS — E1042 Type 1 diabetes mellitus with diabetic polyneuropathy: Secondary | ICD-10-CM | POA: Diagnosis not present

## 2018-02-14 DIAGNOSIS — E1022 Type 1 diabetes mellitus with diabetic chronic kidney disease: Secondary | ICD-10-CM | POA: Diagnosis not present

## 2018-02-14 DIAGNOSIS — I87312 Chronic venous hypertension (idiopathic) with ulcer of left lower extremity: Secondary | ICD-10-CM | POA: Diagnosis not present

## 2018-02-14 DIAGNOSIS — L97822 Non-pressure chronic ulcer of other part of left lower leg with fat layer exposed: Secondary | ICD-10-CM | POA: Diagnosis not present

## 2018-02-20 ENCOUNTER — Ambulatory Visit (INDEPENDENT_AMBULATORY_CARE_PROVIDER_SITE_OTHER): Payer: Medicare Other | Admitting: Physician Assistant

## 2018-02-20 ENCOUNTER — Encounter: Payer: Self-pay | Admitting: Physician Assistant

## 2018-02-20 ENCOUNTER — Ambulatory Visit (HOSPITAL_COMMUNITY)
Admission: RE | Admit: 2018-02-20 | Discharge: 2018-02-20 | Disposition: A | Discharge status: Home / Self Care | Payer: Medicare Other | Source: Ambulatory Visit | Attending: Cardiology | Admitting: Cardiology

## 2018-02-20 VITALS — BP 128/52 | HR 76 | Ht 70.0 in | Wt 167.0 lb

## 2018-02-20 DIAGNOSIS — I10 Essential (primary) hypertension: Secondary | ICD-10-CM | POA: Diagnosis not present

## 2018-02-20 DIAGNOSIS — R0989 Other specified symptoms and signs involving the circulatory and respiratory systems: Secondary | ICD-10-CM | POA: Diagnosis not present

## 2018-02-20 DIAGNOSIS — I739 Peripheral vascular disease, unspecified: Secondary | ICD-10-CM | POA: Diagnosis not present

## 2018-02-20 DIAGNOSIS — I779 Disorder of arteries and arterioles, unspecified: Secondary | ICD-10-CM | POA: Diagnosis not present

## 2018-02-20 DIAGNOSIS — R011 Cardiac murmur, unspecified: Secondary | ICD-10-CM

## 2018-02-20 DIAGNOSIS — I70291 Other atherosclerosis of native arteries of extremities, right leg: Secondary | ICD-10-CM | POA: Insufficient documentation

## 2018-02-20 DIAGNOSIS — L97922 Non-pressure chronic ulcer of unspecified part of left lower leg with fat layer exposed: Secondary | ICD-10-CM

## 2018-02-20 DIAGNOSIS — E785 Hyperlipidemia, unspecified: Secondary | ICD-10-CM

## 2018-02-20 DIAGNOSIS — I771 Stricture of artery: Secondary | ICD-10-CM | POA: Insufficient documentation

## 2018-02-20 DIAGNOSIS — R0609 Other forms of dyspnea: Secondary | ICD-10-CM | POA: Diagnosis not present

## 2018-02-20 DIAGNOSIS — I25709 Atherosclerosis of coronary artery bypass graft(s), unspecified, with unspecified angina pectoris: Secondary | ICD-10-CM | POA: Diagnosis not present

## 2018-02-20 DIAGNOSIS — I70202 Unspecified atherosclerosis of native arteries of extremities, left leg: Secondary | ICD-10-CM | POA: Insufficient documentation

## 2018-02-20 NOTE — Patient Instructions (Signed)
Medication Instructions: Your physician recommends that you continue on your current medications as directed. Please refer to the Current Medication list given to you today.  If you need a refill on your cardiac medications before your next appointment, please call your pharmacy.   Labwork: Your provider would like for you to return in one month to have the following labs drawn: FASTING lipid. You do not need an appointment for the lab. Once in our office lobby there is a podium where you can sign in and ring the doorbell to alert us that you are here. The lab is open from 8:00 am to 4:30 pm; closed for lunch from 12:45pm-1:45pm.   Procedures/Testing: Your physician has requested that you have a lexiscan myoview. For further information please visit [email or web address]. Please follow instruction sheet, as given. This will take place at [address], [address]  Your physician has requested that you have a carotid duplex. This test is an ultrasound of the carotid arteries in your neck. It looks at blood flow through these arteries that supply the brain with blood. Allow one hour for this exam. There are no restrictions or special instructions. This will take place at [address], [address].  Your physician has requested that you have an echocardiogram. Echocardiography is a painless test that uses sound waves to create images of your heart. It provides your doctor with information about the size and shape of your heart and how well your heart's chambers and valves are working. This procedure takes approximately one hour. There are no restrictions for this procedure. This will take place at [address], [address].   Follow-Up: Your physician wants you to follow-up in: 3 months with Dr. Tresa EndoKelly.    Thank you for choosing [facility]!!

## 2018-02-20 NOTE — Progress Notes (Addendum)
Cardiology Office Note    Date:  02/22/2018   ID:  Henry Less., DOB 12/12/1941, MRN [id number]  PCP:  Romero Belling, MD  Cardiologist:  Dr. Tresa Endo   Chief Complaint  Patient presents with  . Follow-up    having some shortness of breath with exertion, X1wk, no chest pain    History of Present Illness:  Henry Depaz. is a 77 y.o. male with PMH of type 1 DM, HTN, HLD, PAD, and CAD.  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  He underwent PTCA of his RCA.  Due to extensive coronary artery disease, he ultimately underwent elective CABG.  He also had a history of right carotid endarterectomy.  Cardiac catheterization in March 2011 showed 95% stenosis in SVG to RCA, he ultimately underwent successful stenting with 3.0 x 26 mm stent.  A bare metal stent was used in light of his significant diabetic retinopathy with significant bleeding history to reduce his need for long-term dual antiplatelet therapy.  Last Myoview in May 2017 showed EF 52%, medium defect of moderate severity present in the basal inferoseptal and mid inferolateral septal location consistent with prior MI.  Last echocardiogram obtained on 05/31/2016 showed EF 50%, grade 2 DD, moderate MR, PA peak pressure 41 mmHg.  He had episodes of these in the past concerning for autonomic neuropathy.  However he never had any syncope.  He uses gabapentin for peripheral neuropathy.  He is unable to tolerate ACE inhibitor or ARB.  There is also a possibility of hydralazine related swelling back in 2011 as well.  In regard to his peripheral arterial disease, he has occluded anterior tibial and posterior tibial artery on the right, early high-grade left SFA stenosis with occluded left posterior tibial artery.  He has chronic claudication symptoms, however there was no significant progression in his symptom.  Therefore Dr. Gery Pray recommended no further therapy.  He is intolerant of Crestor, Lipitor and Zocor.   He was last seen by Dr. Tresa Endo in March 2018, Zetia 10 mg was added.  Patient presents today for evaluation of dyspnea on exertion.  This has been ongoing for the past week.  He says he used to be quite active and able to walk his dog without any issue.  He started noticing worsening dyspnea on exertion especially on incline a week ago.  In this regard, it is similar to what he experienced in 2011 prior to his stent placement.  The only difference is he also had a chest pressure at the time and that he had denies any recent chest pain.  On physical exam, he continued to have a 3 out of 6 murmur at the apex.  I plan to obtain a echocardiogram to make sure his valvular regurgitation has not worsened.  Even if it is severe, he will not be a good candidate for traditional repair, he may be a candidate for TMVR versus mitral clip.  Otherwise, I am concerned about anginal equivalent and recommended a Lexiscan stress test.  He does have bilateral carotid bruit, worse on the left side.  Previous carotid ultrasound in 2017 showed mild disease bilaterally.  He is due for repeat carotid ultrasound.  This test is not urgent.  Unfortunately, he is no longer taking Zetia.  He is unable to tolerate any of the statins.  I recommend a repeat fasting lipid panel in 1 month, if LDL remains high, I will referred the patient  to lipid clinic for consideration of PCSK 9 inhibitor.   Past Medical History:  Diagnosis Date  . ANEMIA-NOS 07/10/2007  . CORONARY ARTERY DISEASE 07/10/2007  . DIABETES MELLITUS, TYPE I 07/10/2007  . HYPERLIPIDEMIA 07/10/2007  . HYPERTENSION 07/10/2007  . Peripheral arterial disease (HCC)   . 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 RENAL INSUFFICIENCY 07/10/2007    Past Surgical History:  Procedure Laterality Date  . APPENDECTOMY    . CARDIAC CATHETERIZATION     stenting in the vein graft to the right coronary artery and had_DES 3x 26 mm Integrity stent inserted, post dilated to 3.4 mm   . CAROTID ENDARTERECTOMY Right    right  . CHOLECYSTECTOMY N/A 09/13/2017   Procedure: LAPAROSCOPIC CHOLECYSTECTOMY;  Surgeon: Kinsinger, De Blanch, MD;  Location: [facility];  Service: General;  Laterality: N/A;  . CORONARY ARTERY BYPASS GRAFT  02/2010   PTCA of his right coroary artery.   . ESOPHAGOGASTRODUODENOSCOPY N/A 09/11/2017   Procedure: ESOPHAGOGASTRODUODENOSCOPY (EGD);  Surgeon: Hilarie Fredrickson, MD;  Location: [facility];  Service: Endoscopy;  Laterality: N/A;    Current Medications: Outpatient Medications Prior to Visit  Medication Sig Dispense Refill  . acetaminophen (TYLENOL) 500 MG tablet Take 500 mg by mouth daily as needed (pain).    Marland Kitchen aspirin EC 81 MG tablet Take 81 mg by mouth daily.    . B Complex-C-Folic Acid (FOLBEE PLUS) TABS Take 1 tablet by mouth daily.      . Cholecalciferol (VITAMIN D) 2000 UNITS CAPS Take 2,000 Units by mouth daily.     Marland Kitchen gabapentin (NEURONTIN) 100 MG capsule Take 100 mg by mouth 2 (two) times daily.     . hydrochlorothiazide (MICROZIDE) 12.5 MG capsule Take 12.5 mg by mouth daily.    . insulin NPH-regular Human (NOVOLIN 70/30) (70-30) 100 UNIT/ML injection 42 units with breakfast and 25 units with supper 20 mL 11  . isosorbide mononitrate (IMDUR) 60 MG 24 hr tablet TAKE 1 TABLET BY MOUTH DAILY 30 tablet 3  . labetalol (NORMODYNE) 200 MG tablet Take 1 tablet (200 mg total) by mouth 2 (two) times daily. NEED OV. 60 tablet 0  . omeprazole (PRILOSEC OTC) 20 MG tablet Take 20 mg by mouth daily.    Marland Kitchen HYDROcodone-acetaminophen (NORCO) 5-325 MG tablet Take 1 tablet by mouth every 6 (six) hours as needed for moderate pain. (Patient not taking: Reported on 12/05/2017) 20 tablet 0  . polyethylene glycol (MIRALAX / GLYCOLAX) packet Take 17 g by mouth daily as needed. (Patient not taking: Reported on 12/05/2017) 5 each 0   No facility-administered medications prior to visit.      Allergies:   Hydralazine; Penicillins; Amlodipine; Amlodipine besylate; Codeine;  Contrast media [iodinated diagnostic agents]; Lipitor [atorvastatin calcium]; Valsartan; Zetia [ezetimibe]; and Penicillin g   Social History   Socioeconomic History  . Marital status: Married    Spouse name: None  . Number of children: None  . Years of education: BS  . Highest education level: None  Social Needs  . Financial resource strain: None  . Food insecurity - worry: None  . Food insecurity - inability: None  . Transportation needs - medical: None  . Transportation needs - non-medical: None  Occupational History  . Occupation: retired    Comment: 14 yrs ago  Tobacco Use  . Smoking status: Former Games developer  . Smokeless tobacco: Never Used  Substance and Sexual Activity  . Alcohol use: No  . Drug use: No  . Sexual activity: None  Other Topics Concern  . None  Social History Narrative   Says his diet and exercise are very good   Married lives with wife     Family History:  The patient's family history includes Heart disease in his mother.   ROS:   Please see the history of present illness.    ROS All other systems reviewed and are negative.   PHYSICAL EXAM:   VS:  BP (!) 128/52   Pulse 76   Ht 5\' 10"  (1.778 m)   Wt 167 lb (75.8 kg)   BMI 23.96 kg/m    GEN: Well nourished, well developed, in no acute distress  HEENT: normal  Neck: no JVD, carotid bruits, or masses Cardiac: RRR; no murmurs, rubs, or gallops,no edema  Respiratory:  clear to auscultation bilaterally, normal work of breathing GI: soft, nontender, nondistended, + BS MS: no deformity or atrophy  Skin: warm and dry, no rash Neuro:  Alert and Oriented x 3, Strength and sensation are intact Psych: euthymic mood, full affect  Wt Readings from Last 3 Encounters:  02/20/18 167 lb (75.8 kg)  12/05/17 161 lb (73 kg)  10/05/17 153 lb 12.8 oz (69.8 kg)      Studies/Labs Reviewed:   EKG:  EKG is ordered today.  The ekg ordered today demonstrates normal sinus rhythm, nonspecific ST-T wave  changes.  Recent Labs: 05/03/2017: TSH 2.85 09/12/2017: ALT 18 09/15/2017: BUN 35; Creatinine, Ser 1.79; Hemoglobin 8.9; Platelets 260; Potassium 3.4; Sodium 135   Lipid Panel    Component Value Date/Time   CHOL 167 05/03/2017 0814   TRIG 84 05/03/2017 0814   HDL 40 (L) 05/03/2017 0814   CHOLHDL 4.2 05/03/2017 0814   VLDL 17 05/03/2017 0814   LDLCALC 110 (H) 05/03/2017 0814    Additional studies/ records that were reviewed today include:    Echo 05/31/2016 LV EF: 50%  Study Conclusions  - Left ventricle: The cavity size was normal. Wall thickness was   normal. The estimated ejection fraction was 50%. Basal to mid   inferior and inferoseptal severe hypokinesis. Features are   consistent with a pseudonormal left ventricular filling pattern,   with concomitant abnormal relaxation and increased filling   pressure (grade 2 diastolic dysfunction). - Aortic valve: There was no stenosis. - Mitral valve: Bileaflet mitral valve prolapse with moderate MR.   Mildly calcified annulus. - Left atrium: The atrium was mildly dilated. - Right ventricle: The cavity size was normal. Systolic function   was mildly reduced. - Pulmonary arteries: PA peak pressure: 41 mm Hg (S). - Inferior vena cava: The vessel was normal in size. The   respirophasic diameter changes were in the normal range (= 50%),   consistent with normal central venous pressure.  Impressions:  - Normal LV size with EF 50%. Basal to mid inferior and   inferoseptal severe hypokinesis. Moderate diastolic dysfunction.   Normal RV size with mildly decreased systolic function. Mitral   valve prolapse with moderate mitral regurgitation. Mild pulmonary   hypertension.  ASSESSMENT:    1. Dyspnea on exertion   2. Bilateral carotid bruits   3. Dyslipidemia   4. 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 PAD (peripheral artery disease) (HCC)   6. Essential hypertension    7. Murmur      PLAN:  In order of problems listed above:  1. Dyspnea on exertion: His symptom is similar to what he experienced prior to his  previous cardiac catheterization, the only difference is this time he did not have chest pain.  I recommended a Lexiscan Myoview to rule out significant reversible disease  2. Bilateral carotid bruit: Worse on the left side, although his previous carotid ultrasound 2 years ago did not show significant disease, he has noticeable bruit on physical exam.  I will repeat a carotid ultrasound  3. Murmur: Louder near the apex suggesting possible mitral regurgitation.  I plan to obtain echocardiogram.  If mitral valve worsens, may need to consider either TMVR versus mitral clip.  4. Hyperlipidemia: I will obtain fasting lipid panel in 1 month, if cholesterol still uncontrolled, then I will referred the patient to lipid clinic for consideration of PCSK 9 inhibitor.  He is unable to tolerate any statins.  5. Hypertension: Blood pressure stable.    Medication Adjustments/Labs and Tests Ordered: Current medicines are reviewed at length with the patient today.  Concerns regarding medicines are outlined above.  Medication changes, Labs and Tests ordered today are listed in the Patient Instructions below. Patient Instructions  Medication Instructions: Your physician recommends that you continue on your current medications as directed. Please refer to the Current Medication list given to you today.  If you need a refill on your cardiac medications before your next appointment, please call your pharmacy.   Labwork: Your provider would like for you to return in one month to have the following labs drawn: FASTING lipid. You do not need an appointment for the lab. Once in our office lobby there is a podium where you can sign in and ring the doorbell to alert [place] that you are here. The lab is open from 8:00 am to 4:30 pm; closed for lunch from  12:45pm-1:45pm.   Procedures/Testing: Your physician has requested that you have a lexiscan myoview. For further information please visit [email or web address] Please follow instruction sheet, as given. This will take place at [address] [facility], [address]  Your physician has requested that you have a carotid duplex. This test is an ultrasound of the carotid arteries in your neck. It looks at blood flow through these arteries that supply the brain with blood. Allow one hour for this exam. There are no restrictions or special instructions. This will take place at [address] [facility], [address].  Your physician has requested that you have an echocardiogram. Echocardiography is a painless test that uses sound waves to create images of your heart. It provides your doctor with information about the size and shape of your heart and how well your heart's chambers and valves are working. This procedure takes approximately one hour. There are no restrictions for this procedure. This will take place at [address].   Follow-Up: Your physician wants you to follow-up in: 3 months with Dr. Tresa Endo.    Thank you for choosing [facility] at [facility], [place] Course, [place]  02/22/2018 10:40 AM    [facility] [address] [place], [place], [place]  [address] Phone: [phone]; Fax: [phone]

## 2018-02-21 DIAGNOSIS — E1022 Type 1 diabetes mellitus with diabetic chronic kidney disease: Secondary | ICD-10-CM | POA: Diagnosis not present

## 2018-02-21 DIAGNOSIS — E10622 Type 1 diabetes mellitus with other skin ulcer: Secondary | ICD-10-CM | POA: Diagnosis not present

## 2018-02-21 DIAGNOSIS — E1051 Type 1 diabetes mellitus with diabetic peripheral angiopathy without gangrene: Secondary | ICD-10-CM | POA: Diagnosis not present

## 2018-02-21 DIAGNOSIS — I87312 Chronic venous hypertension (idiopathic) with ulcer of left lower extremity: Secondary | ICD-10-CM | POA: Diagnosis not present

## 2018-02-21 DIAGNOSIS — L97822 Non-pressure chronic ulcer of other part of left lower leg with fat layer exposed: Secondary | ICD-10-CM | POA: Diagnosis not present

## 2018-02-21 DIAGNOSIS — E1042 Type 1 diabetes mellitus with diabetic polyneuropathy: Secondary | ICD-10-CM | POA: Diagnosis not present

## 2018-02-22 ENCOUNTER — Encounter: Payer: Self-pay | Admitting: Physician Assistant

## 2018-02-28 ENCOUNTER — Encounter (HOSPITAL_BASED_OUTPATIENT_CLINIC_OR_DEPARTMENT_OTHER): Payer: Medicare Other | Attending: Internal Medicine

## 2018-02-28 ENCOUNTER — Telehealth: Payer: Self-pay | Admitting: Cardiovascular Disease

## 2018-02-28 DIAGNOSIS — I87302 Chronic venous hypertension (idiopathic) without complications of left lower extremity: Secondary | ICD-10-CM | POA: Diagnosis not present

## 2018-02-28 DIAGNOSIS — I2510 Atherosclerotic heart disease of native coronary artery without angina pectoris: Secondary | ICD-10-CM | POA: Insufficient documentation

## 2018-02-28 DIAGNOSIS — E1042 Type 1 diabetes mellitus with diabetic polyneuropathy: Secondary | ICD-10-CM | POA: Insufficient documentation

## 2018-02-28 DIAGNOSIS — E1022 Type 1 diabetes mellitus with diabetic chronic kidney disease: Secondary | ICD-10-CM | POA: Insufficient documentation

## 2018-02-28 DIAGNOSIS — E10622 Type 1 diabetes mellitus with other skin ulcer: Secondary | ICD-10-CM | POA: Diagnosis not present

## 2018-02-28 DIAGNOSIS — E1051 Type 1 diabetes mellitus with diabetic peripheral angiopathy without gangrene: Secondary | ICD-10-CM | POA: Insufficient documentation

## 2018-02-28 DIAGNOSIS — T8189XA Other complications of procedures, not elsewhere classified, initial encounter: Secondary | ICD-10-CM | POA: Diagnosis not present

## 2018-02-28 DIAGNOSIS — I739 Peripheral vascular disease, unspecified: Secondary | ICD-10-CM | POA: Diagnosis not present

## 2018-02-28 DIAGNOSIS — L97822 Non-pressure chronic ulcer of other part of left lower leg with fat layer exposed: Secondary | ICD-10-CM | POA: Insufficient documentation

## 2018-02-28 DIAGNOSIS — N186 End stage renal disease: Secondary | ICD-10-CM | POA: Diagnosis not present

## 2018-02-28 NOTE — Telephone Encounter (Signed)
Closed Encounter  °

## 2018-03-01 ENCOUNTER — Encounter: Payer: Self-pay | Admitting: Cardiovascular Disease

## 2018-03-01 ENCOUNTER — Ambulatory Visit (INDEPENDENT_AMBULATORY_CARE_PROVIDER_SITE_OTHER): Payer: Medicare Other | Admitting: Cardiovascular Disease

## 2018-03-01 DIAGNOSIS — E785 Hyperlipidemia, unspecified: Secondary | ICD-10-CM | POA: Diagnosis not present

## 2018-03-01 DIAGNOSIS — I10 Essential (primary) hypertension: Secondary | ICD-10-CM

## 2018-03-01 DIAGNOSIS — I2510 Atherosclerotic heart disease of native coronary artery without angina pectoris: Secondary | ICD-10-CM | POA: Diagnosis not present

## 2018-03-01 DIAGNOSIS — I70229 Atherosclerosis of native arteries of extremities with rest pain, unspecified extremity: Secondary | ICD-10-CM

## 2018-03-01 DIAGNOSIS — I998 Other disorder of circulatory system: Secondary | ICD-10-CM | POA: Diagnosis not present

## 2018-03-01 NOTE — Assessment & Plan Note (Addendum)
History of essential hypertension blood pressure 120/73. He is on hydrochlorothiazide and labetalol. Continue current meds at current dosing.

## 2018-03-01 NOTE — Assessment & Plan Note (Signed)
History of dyslipidemia not on statin therapy with lipid profile performed 05/03/17 revealing a total cholesterol 167, LDL 110 and HDL 45 PCP. He may need a statin.

## 2018-03-01 NOTE — Progress Notes (Signed)
03/01/2018 [name]   02/01/1941  [id number]  Primary Physician Romero Belling, MD Primary Cardiologist: Runell Gess MD Milagros Loll, [place], [place]  HPI:  Henry [name]. is a 77 y.o.  year-old thin-appearing Caucasian male patient of Dr. Devona Konig and Dr. Jannetta Quint,. I last saw him in the office 11/04/14. He has a history of hypertension, hyperlipidemia and diabetes. He has coronary artery disease status post remote myocardial infarction in 1999 with stenting of his RCA and subsequent coronary artery bypass grafting. He is likewise had right carotid endarterectomy in the past. He was referred because of poorly palpable pedal pulses though he really denies significant claudication and does not have any evidence of open wounds. Lower extremity arterial Doppler studies performed at our office 04/29/14 revealed tibial vessel disease on the right with only a patent peroneal artery and moderate SFA disease on the left with an occluded posterior tibial.  He saw one of our PAs several weeks ago with increasing dyspnea on exertion similar to his pre-RCA vein graft intervention in symptoms in 2011. A 2-D echo and a Myoview stress test were ordered. He see me back for peripheral vascular disease.  The nonhealing wound on his left calf which has been present since 1999 when he had vein harvested during his bypass graft operation and has gotten worse recently although apparently improving with aggressive local wound care. He had Doppler studies performed in our office 02/26/18 revealing a right ABI of 1.18 and a left ABI 0.73. He did have a high-frequency signal in his mid left SFA with 1 vessel runoff via the peroneal artery.   Current Meds  Medication Sig  . acetaminophen (TYLENOL) 500 MG tablet Take 500 mg by mouth daily as needed (pain).  Marland Kitchen aspirin EC 81 MG tablet Take 81 mg by mouth daily.  . B Complex-C-Folic Acid (FOLBEE PLUS) TABS Take 1 tablet by mouth daily.    . Cholecalciferol  (VITAMIN D) 2000 UNITS CAPS Take 2,000 Units by mouth daily.   Marland Kitchen gabapentin (NEURONTIN) 100 MG capsule Take 100 mg by mouth 2 (two) times daily.   . hydrochlorothiazide (MICROZIDE) 12.5 MG capsule Take 12.5 mg by mouth daily.  Marland Kitchen HYDROcodone-acetaminophen (NORCO) 5-325 MG tablet Take 1 tablet by mouth every 6 (six) hours as needed for moderate pain.  Marland Kitchen insulin NPH-regular Human (NOVOLIN 70/30) (70-30) 100 UNIT/ML injection 42 units with breakfast and 25 units with supper  . isosorbide mononitrate (IMDUR) 60 MG 24 hr tablet TAKE 1 TABLET BY MOUTH DAILY  . labetalol (NORMODYNE) 200 MG tablet Take 1 tablet (200 mg total) by mouth 2 (two) times daily. NEED OV.  Marland Kitchen omeprazole (PRILOSEC OTC) 20 MG tablet Take 20 mg by mouth daily.  . polyethylene glycol (MIRALAX / GLYCOLAX) packet Take 17 g by mouth daily as needed.     Allergies  Allergen Reactions  . Hydralazine Swelling    Feet swelled so bad they were cracking Severe foot swelling  . Penicillins Other (See Comments)    Per allergy test.  . Amlodipine Swelling  . Amlodipine Besylate Swelling  . Codeine Other (See Comments)    anxiety  . Contrast Media [Iodinated Diagnostic Agents]     Kidney issues when given  . Lipitor [Atorvastatin Calcium] Other (See Comments)    myalgia  . Valsartan Other (See Comments) and Swelling    Caused kidney damage  . Zetia [Ezetimibe] Other (See Comments)    Severe dizziness  . Penicillin  G Rash    Social History   Socioeconomic History  . Marital status: Married    Spouse name: Not on file  . Number of children: Not on file  . Years of education: BS  . Highest education level: Not on file  Social Needs  . Financial resource strain: Not on file  . Food insecurity - worry: Not on file  . Food insecurity - inability: Not on file  . Transportation needs - medical: Not on file  . Transportation needs - non-medical: Not on file  Occupational History  . Occupation: retired    Comment: 14 yrs ago    Tobacco Use  . Smoking status: Former Games developermoker  . Smokeless tobacco: Never Used  Substance and Sexual Activity  . Alcohol use: No  . Drug use: No  . Sexual activity: Not on file  Other Topics Concern  . Not on file  Social History Narrative   Says his diet and exercise are very good   Married lives with wife     Review of Systems: General: negative for chills, fever, night sweats or weight changes.  Cardiovascular: negative for chest pain, dyspnea on exertion, edema, orthopnea, palpitations, paroxysmal nocturnal dyspnea or shortness of breath Dermatological: negative for rash Respiratory: negative for cough or wheezing Urologic: negative for hematuria Abdominal: negative for nausea, vomiting, diarrhea, bright red blood per rectum, melena, or hematemesis Neurologic: negative for visual changes, syncope, or dizziness All other systems reviewed and are otherwise negative except as noted above.    Blood pressure 128/73, pulse (!) 56, height 5\' 10"  (1.778 m), weight 163 lb 3.2 oz (74 kg).  General appearance: alert and no distress Neck: no adenopathy, no JVD, supple, symmetrical, trachea midline, thyroid not enlarged, symmetric, no tenderness/mass/nodules and Bilateral carotid bruits Lungs: clear to auscultation bilaterally Heart: regular rate and rhythm, S1, S2 normal, no murmur, click, rub or gallop Extremities: extremities normal, atraumatic, no cyanosis or edema Pulses: Diminished pedal pulses bilaterally Skin: Skin color, texture, turgor normal. No rashes or lesions Neurologic: Alert and oriented X 3, normal strength and tone. Normal symmetric reflexes. Normal coordination and gait  EKG not performed today  ASSESSMENT AND PLAN:   Dyslipidemia History of dyslipidemia not on statin therapy with lipid profile performed 05/03/17 revealing a total cholesterol 167, LDL 110 and HDL 45 PCP. He may need a statin.  Essential hypertension History of essential hypertension blood  pressure 120/73. He is on hydrochlorothiazide and labetalol. Continue current meds at current dosing.  Coronary atherosclerosis History of CAD status post bypass grafting in 1999 after an inferior MI with subsequent coronary bypass grafting. Catheterization by Dr. Tresa EndoKelly in 2011 and had stenting of his RCA vein graft. This was done because of dyspnea improvement in symptoms subsequently. He has had some increasing shortness of breath and was seen by Azalee CourseHao Meng PAC  in the office 02/20/18 for increasing shortness of breath. 2-D echo and a Myoview stress test were ordered. The symptoms have somewhat improved over the last several weeks.  Critical lower limb ischemia History critical limb ischemia with a nonhealing wound on his left leg. He's had this since his vein graft harvest in 1999. Recent Doppler showed a right ABI of 1.18 and a left ABI of 0.73. He did have a high-frequency signal in his proximal left SFA with 1 vessel runoff via peroneal artery. [facility] and says he's had some improvement in his wound with aggressive local wound care. I will see  him back in 2 months. His wound is healing will follow him conservatively. Otherwise, we can arrange for him to undergo left SFA intervention.      Runell Gess MD FACP,FACC,FAHA, [facility] 03/01/2018 10:33 AM

## 2018-03-01 NOTE — Assessment & Plan Note (Signed)
History critical limb ischemia with a nonhealing wound on his left leg. He's had this since his vein graft harvest in 1999. Recent Doppler showed a right ABI of 1.18 and a left ABI of 0.73. He did have a high-frequency signal in his proximal left SFA with 1 vessel runoff via peroneal artery. He [facility] and says he's had some improvement in his wound with aggressive local wound care. I will see him back in 2 months. His wound is healing will follow him conservatively. Otherwise, we can arrange for him to undergo left SFA intervention.

## 2018-03-01 NOTE — Patient Instructions (Signed)
Medication Instructions: Your physician recommends that you continue on your current medications as directed. Please refer to the Current Medication list given to you today.   Follow-Up: Your physician recommends that you schedule a follow-up appointment in: 2 months with Dr. Berry.    

## 2018-03-01 NOTE — Assessment & Plan Note (Signed)
History of CAD status post bypass grafting in 1999 after an inferior MI with subsequent coronary bypass grafting. Catheterization by Dr. Tresa EndoKelly in 2011 and had stenting of his RCA vein graft. This was done because of dyspnea improvement in symptoms subsequently. He has had some increasing shortness of breath and was seen by Azalee CourseHao Meng PAC  in the office 02/20/18 for increasing shortness of breath. 2-D echo and a Myoview stress test were ordered. The symptoms have somewhat improved over the last several weeks.

## 2018-03-05 ENCOUNTER — Ambulatory Visit: Payer: PRIVATE HEALTH INSURANCE | Admitting: Sports Medicine

## 2018-03-06 ENCOUNTER — Ambulatory Visit (INDEPENDENT_AMBULATORY_CARE_PROVIDER_SITE_OTHER): Payer: Medicare Other | Admitting: Endocrinology

## 2018-03-06 ENCOUNTER — Telehealth (HOSPITAL_COMMUNITY): Payer: Self-pay

## 2018-03-06 ENCOUNTER — Encounter: Payer: Self-pay | Admitting: Endocrinology

## 2018-03-06 VITALS — BP 132/64 | HR 76 | Wt 164.8 lb

## 2018-03-06 DIAGNOSIS — E1029 Type 1 diabetes mellitus with other diabetic kidney complication: Secondary | ICD-10-CM | POA: Diagnosis not present

## 2018-03-06 DIAGNOSIS — E1065 Type 1 diabetes mellitus with hyperglycemia: Secondary | ICD-10-CM | POA: Diagnosis not present

## 2018-03-06 DIAGNOSIS — I2510 Atherosclerotic heart disease of native coronary artery without angina pectoris: Secondary | ICD-10-CM | POA: Diagnosis not present

## 2018-03-06 DIAGNOSIS — IMO0002 Reserved for concepts with insufficient information to code with codable children: Secondary | ICD-10-CM

## 2018-03-06 LAB — POCT GLYCOSYLATED HEMOGLOBIN (HGB A1C): HEMOGLOBIN A1C: 9.6

## 2018-03-06 MED ORDER — INSULIN NPH ISOPHANE & REGULAR (70-30) 100 UNIT/ML ~~LOC~~ SUSP: SUBCUTANEOUS | 11 refills | Status: DC

## 2018-03-06 NOTE — Patient Instructions (Addendum)
Please change the insulin to the numbers listed below. check your blood sugar 4 times a day: before the 3 meals, and at bedtime.  also check if you have symptoms of your blood sugar being too high or too low.  please keep a record of the readings and bring it to your next appointment here (or you can bring the meter itself).  You can write it on any piece of paper.  please call us sooner if your blood sugar goes below 70, or if you have a lot of readings over 200. Please come back for a follow-up appointment in 3 months

## 2018-03-06 NOTE — Telephone Encounter (Signed)
Encounter complete. 

## 2018-03-06 NOTE — Progress Notes (Signed)
Subjective:    Patient ID: Henry LessGlenn T Weseman Jr., male    DOB: 31-May-1941, 77 y.o.   MRN: [id number]  HPI Pt returns for f/u of diabetes mellitus: DM type: 1 Dx'ed: 1961 Complications: renal insufficiency, CAD, PAD, and retinopathy.   Therapy: insulin since dx.  DKA: never.   Severe hypoglycemia: last episode was 2016.   Pancreatitis: never.  Other: after poor results with multiple daily injections, he was changed to 70/30; he has declined pump and continuous glucose monitor; pt says he cannot afford insulin analogs.  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  Interval history: He brings a record of his cbg's which I have reviewed today.  It varies from 53-262.  It is lowest at lunch, and highest at HS, but there is not much trend throughout the day.  pt states he feels well in general.   Past Medical History:  Diagnosis Date  . ANEMIA-NOS 07/10/2007  . CORONARY ARTERY DISEASE 07/10/2007  . DIABETES MELLITUS, TYPE I 07/10/2007  . HYPERLIPIDEMIA 07/10/2007  . HYPERTENSION 07/10/2007  . Peripheral arterial disease (HCC)   . 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 RENAL INSUFFICIENCY 07/10/2007    Past Surgical History:  Procedure Laterality Date  . APPENDECTOMY    . CARDIAC CATHETERIZATION     stenting in the vein graft to the right coronary artery and had_DES 3x 26 mm Integrity stent inserted, post dilated to 3.4 mm  . CAROTID ENDARTERECTOMY Right    right  . CHOLECYSTECTOMY N/A 09/13/2017   Procedure: LAPAROSCOPIC CHOLECYSTECTOMY;  Surgeon: Kinsinger, De BlanchLuke Aaron, MD;  Location: [facility];  Service: General;  Laterality: N/A;  . CORONARY ARTERY BYPASS GRAFT  02/2010   PTCA of his right coroary artery.   . ESOPHAGOGASTRODUODENOSCOPY N/A 09/11/2017   Procedure: ESOPHAGOGASTRODUODENOSCOPY (EGD);  Surgeon: Hilarie FredricksonPerry, John N, MD;  [facility];  Service: Endoscopy;  Laterality: N/A;    Social History   Socioeconomic History  .  Marital status: Married    Spouse name: Not on file  . Number of children: Not on file  . Years of education: BS  . Highest education level: Not on file  Social Needs  . Financial resource strain: Not on file  . Food insecurity - worry: Not on file  . Food insecurity - inability: Not on file  . Transportation needs - medical: Not on file  . Transportation needs - non-medical: Not on file  Occupational History  . Occupation: retired    Comment: 14 yrs ago  Tobacco Use  . Smoking status: Former Games developermoker  . Smokeless tobacco: Never Used  Substance and Sexual Activity  . Alcohol use: No  . Drug use: No  . Sexual activity: Not on file  Other Topics Concern  . Not on file  Social History Narrative   Says his diet and exercise are very good   Married lives with wife    Current Outpatient Medications on File Prior to Visit  Medication Sig Dispense Refill  . acetaminophen (TYLENOL) 500 MG tablet Take 500 mg by mouth daily as needed (pain).    Marland Kitchen. aspirin EC 81 MG tablet Take 81 mg by mouth daily.    . B Complex-C-Folic Acid (FOLBEE PLUS) TABS Take 1 tablet by mouth daily.      . Cholecalciferol (VITAMIN D) 2000 UNITS CAPS Take 2,000 Units by mouth daily.     Marland Kitchen. gabapentin (NEURONTIN) 100 MG capsule Take 100 mg by mouth 2 (two) times daily.     .Marland Kitchen  hydrochlorothiazide (MICROZIDE) 12.5 MG capsule Take 12.5 mg by mouth daily.    . isosorbide mononitrate (IMDUR) 60 MG 24 hr tablet TAKE 1 TABLET BY MOUTH DAILY 30 tablet 3  . labetalol (NORMODYNE) 200 MG tablet Take 1 tablet (200 mg total) by mouth 2 (two) times daily. NEED OV. 60 tablet 0  . omeprazole (PRILOSEC OTC) 20 MG tablet Take 20 mg by mouth daily.    Marland Kitchen HYDROcodone-acetaminophen (NORCO) 5-325 MG tablet Take 1 tablet by mouth every 6 (six) hours as needed for moderate pain. (Patient not taking: Reported on 03/06/2018) 20 tablet 0  . polyethylene glycol (MIRALAX / GLYCOLAX) packet Take 17 g by mouth daily as needed. (Patient not taking:  Reported on 03/06/2018) 5 each 0   No current facility-administered medications on file prior to visit.     Allergies  Allergen Reactions  . Hydralazine Swelling    Feet swelled so bad they were cracking Severe foot swelling  . Penicillins Other (See Comments)    Per allergy test.  . Amlodipine Swelling  . Amlodipine Besylate Swelling  . Codeine Other (See Comments)    anxiety  . Contrast Media [Iodinated Diagnostic Agents]     Kidney issues when given  . Lipitor [Atorvastatin Calcium] Other (See Comments)    myalgia  . Valsartan Other (See Comments) and Swelling    Caused kidney damage  . Zetia [Ezetimibe] Other (See Comments)    Severe dizziness  . Penicillin G Rash    Family History  Problem Relation Age of Onset  . Heart disease Mother   . Cancer Neg Hx     BP 132/64 (BP Location: Left Arm, Patient Position: Sitting, Cuff Size: Normal)   Pulse 76   Wt 164 lb 12.8 oz (74.8 kg)   SpO2 97%   BMI 23.65 kg/m    Review of Systems Denies LOC    Objective:   Physical Exam VITAL SIGNS:  See vs page.  GENERAL: no distress.  Pulses: dorsalis pedis absent bilat.  Skin: feet are of normal temp.  left leg is bandaged. There is patchy hyperpigmentation. There is an old healed vein harvest scar at the left leg.   Feet: no deformity. There is bilateral onychomycosis.  CV: no leg edema.  Neuro: sensation is intact to touch on the feet, but severely decreased from normal.  Lab Results  Component Value Date   HGBA1C 9.6 03/06/2018       Assessment & Plan:  Type 1 DM: the pattern of his cbg's indicates he needs some adjustment in his therapy  Patient Instructions  Please change the insulin to the numbers listed below. check your blood sugar 4 times a day: before the 3 meals, and at bedtime.  also check if you have symptoms of your blood sugar being too high or too low.  please keep a record of the readings and bring it to your next appointment here (or you can bring  the meter itself).  You can write it on any piece of paper.  please call us sooner if your blood sugar goes below 70, or if you have a lot of readings over 200. Please come back for a follow-up appointment in 3 months

## 2018-03-07 DIAGNOSIS — E1051 Type 1 diabetes mellitus with diabetic peripheral angiopathy without gangrene: Secondary | ICD-10-CM | POA: Diagnosis not present

## 2018-03-07 DIAGNOSIS — E1022 Type 1 diabetes mellitus with diabetic chronic kidney disease: Secondary | ICD-10-CM | POA: Diagnosis not present

## 2018-03-07 DIAGNOSIS — T8189XA Other complications of procedures, not elsewhere classified, initial encounter: Secondary | ICD-10-CM | POA: Diagnosis not present

## 2018-03-07 DIAGNOSIS — L97822 Non-pressure chronic ulcer of other part of left lower leg with fat layer exposed: Secondary | ICD-10-CM | POA: Diagnosis not present

## 2018-03-07 DIAGNOSIS — I2510 Atherosclerotic heart disease of native coronary artery without angina pectoris: Secondary | ICD-10-CM | POA: Diagnosis not present

## 2018-03-07 DIAGNOSIS — E10622 Type 1 diabetes mellitus with other skin ulcer: Secondary | ICD-10-CM | POA: Diagnosis not present

## 2018-03-07 DIAGNOSIS — N186 End stage renal disease: Secondary | ICD-10-CM | POA: Diagnosis not present

## 2018-03-08 ENCOUNTER — Ambulatory Visit (HOSPITAL_COMMUNITY)
Admission: RE | Admit: 2018-03-08 | Discharge: 2018-03-08 | Disposition: A | Discharge status: Home / Self Care | Payer: Medicare Other | Source: Ambulatory Visit | Attending: Internal Medicine | Admitting: Internal Medicine

## 2018-03-08 DIAGNOSIS — I2510 Atherosclerotic heart disease of native coronary artery without angina pectoris: Secondary | ICD-10-CM | POA: Diagnosis not present

## 2018-03-08 DIAGNOSIS — I10 Essential (primary) hypertension: Secondary | ICD-10-CM | POA: Insufficient documentation

## 2018-03-08 DIAGNOSIS — Z794 Long term (current) use of insulin: Secondary | ICD-10-CM | POA: Diagnosis not present

## 2018-03-08 DIAGNOSIS — Z87891 Personal history of nicotine dependence: Secondary | ICD-10-CM | POA: Diagnosis not present

## 2018-03-08 DIAGNOSIS — R0609 Other forms of dyspnea: Secondary | ICD-10-CM | POA: Diagnosis not present

## 2018-03-08 DIAGNOSIS — I252 Old myocardial infarction: Secondary | ICD-10-CM | POA: Insufficient documentation

## 2018-03-08 DIAGNOSIS — E1151 Type 2 diabetes mellitus with diabetic peripheral angiopathy without gangrene: Secondary | ICD-10-CM | POA: Insufficient documentation

## 2018-03-08 DIAGNOSIS — I779 Disorder of arteries and arterioles, unspecified: Secondary | ICD-10-CM | POA: Diagnosis not present

## 2018-03-08 DIAGNOSIS — I25709 Atherosclerosis of coronary artery bypass graft(s), unspecified, with unspecified angina pectoris: Secondary | ICD-10-CM | POA: Diagnosis not present

## 2018-03-08 DIAGNOSIS — R9439 Abnormal result of other cardiovascular function study: Secondary | ICD-10-CM | POA: Insufficient documentation

## 2018-03-08 DIAGNOSIS — Z951 Presence of aortocoronary bypass graft: Secondary | ICD-10-CM | POA: Diagnosis not present

## 2018-03-08 LAB — MYOCARDIAL PERFUSION IMAGING
CHL CUP NUCLEAR SRS: 9
CHL CUP NUCLEAR SSS: 10
LV dias vol: 140 mL (ref 62–150)
LV sys vol: 76 mL
Peak HR: 77 {beats}/min
Rest HR: 63 {beats}/min
SDS: 1
TID: 1.12

## 2018-03-08 MED ORDER — REGADENOSON 0.4 MG/5ML IV SOLN
0.4000 mg | Freq: Once | INTRAVENOUS | Status: AC
Start: 2018-03-08 — End: 2018-03-08
  Administered 2018-03-08: 0.4 mg via INTRAVENOUS

## 2018-03-08 MED ORDER — TECHNETIUM TC 99M TETROFOSMIN IV KIT
32.5000 | PACK | Freq: Once | INTRAVENOUS | Status: AC | PRN
  Administered 2018-03-08: 32.5 via INTRAVENOUS
  Filled 2018-03-08: qty 33

## 2018-03-08 MED ORDER — TECHNETIUM TC 99M TETROFOSMIN IV KIT
11.0000 | PACK | Freq: Once | INTRAVENOUS | Status: AC | PRN
  Administered 2018-03-08: 11 via INTRAVENOUS
  Filled 2018-03-08: qty 11

## 2018-03-09 NOTE — Progress Notes (Signed)
No reversible significant blockage, the same scar is noted this time at the same location when compare to the previous stress test in 2017. The pumping function was quoted to be 46% which is mildly low, however pending upcoming echo to evaluate true pumping function

## 2018-03-12 ENCOUNTER — Encounter: Payer: Self-pay | Admitting: Podiatry

## 2018-03-12 ENCOUNTER — Ambulatory Visit (INDEPENDENT_AMBULATORY_CARE_PROVIDER_SITE_OTHER): Payer: Medicare Other | Admitting: Podiatry

## 2018-03-12 DIAGNOSIS — B351 Tinea unguium: Secondary | ICD-10-CM

## 2018-03-12 DIAGNOSIS — M79676 Pain in unspecified toe(s): Secondary | ICD-10-CM | POA: Diagnosis not present

## 2018-03-12 DIAGNOSIS — E1151 Type 2 diabetes mellitus with diabetic peripheral angiopathy without gangrene: Secondary | ICD-10-CM | POA: Diagnosis not present

## 2018-03-12 DIAGNOSIS — L84 Corns and callosities: Secondary | ICD-10-CM

## 2018-03-13 NOTE — Progress Notes (Signed)
Subjective: 77 y.o. returns the office today for painful, elongated, thickened toenails which he cannot trim himself. Denies any redness or drainage around the nails.  He has has calluses to both feet that he would have trimmed.  He has no new concerns today.  Denies any acute changes since last appointment and no new complaints today. Denies any systemic complaints such as fevers, chills, nausea, vomiting.   PCP: Henry BellingEllison, Sean, MD  Last A1c was 9.6  Objective: AAO 3, NAD DP/PT pulses 1/4, CRT less than 3 seconds Sensation decreased bilaterally. Nails hypertrophic, dystrophic, elongated, brittle, discolored 10. There is tenderness overlying the nails 1-5 bilaterally. There is no surrounding erythema or drainage along the nail sites. Hyperkeratotic lesions are present submetatarsal 5 bilaterally.  No underlying ulceration, drainage or any signs of infection. No open lesions or pre-ulcerative lesions are identified. No other areas of tenderness bilateral lower extremities. No overlying edema, erythema, increased warmth. No pain with calf compression, swelling, warmth, erythema.  Assessment: Patient presents with symptomatic onychomycosis, hyperkeratotic lesions  Plan: -Treatment options including alternatives, risks, complications were discussed -Nails sharply debrided 10 without complication/bleeding. -Hyperkeratotic lesion Sharpy debrided x2 without any complications or bleeding. -Discussed daily foot inspection. If there are any changes, to call the office immediately.  -Follow-up in 3 months or sooner if any problems are to arise. In the meantime, encouraged to call the office with any questions, concerns, changes symptoms.  Henry [name], DPM

## 2018-03-14 ENCOUNTER — Other Ambulatory Visit: Payer: Self-pay

## 2018-03-14 ENCOUNTER — Ambulatory Visit (HOSPITAL_COMMUNITY): Payer: Medicare Other | Attending: Cardiology

## 2018-03-14 DIAGNOSIS — E119 Type 2 diabetes mellitus without complications: Secondary | ICD-10-CM | POA: Insufficient documentation

## 2018-03-14 DIAGNOSIS — I340 Nonrheumatic mitral (valve) insufficiency: Secondary | ICD-10-CM | POA: Insufficient documentation

## 2018-03-14 DIAGNOSIS — R0609 Other forms of dyspnea: Secondary | ICD-10-CM | POA: Diagnosis not present

## 2018-03-14 DIAGNOSIS — I119 Hypertensive heart disease without heart failure: Secondary | ICD-10-CM | POA: Insufficient documentation

## 2018-03-14 DIAGNOSIS — R0600 Dyspnea, unspecified: Secondary | ICD-10-CM | POA: Diagnosis present

## 2018-03-14 DIAGNOSIS — N289 Disorder of kidney and ureter, unspecified: Secondary | ICD-10-CM | POA: Insufficient documentation

## 2018-03-14 DIAGNOSIS — E785 Hyperlipidemia, unspecified: Secondary | ICD-10-CM | POA: Diagnosis not present

## 2018-03-15 ENCOUNTER — Other Ambulatory Visit (HOSPITAL_COMMUNITY): Payer: Medicare Other

## 2018-03-15 NOTE — Progress Notes (Signed)
Pumping function is near lower limit of normal. Some stiffening of the heart muscle expected with his age.

## 2018-03-18 ENCOUNTER — Other Ambulatory Visit: Payer: Self-pay | Admitting: Physician Assistant

## 2018-03-18 DIAGNOSIS — I6523 Occlusion and stenosis of bilateral carotid arteries: Secondary | ICD-10-CM

## 2018-03-18 DIAGNOSIS — R0989 Other specified symptoms and signs involving the circulatory and respiratory systems: Secondary | ICD-10-CM

## 2018-03-19 ENCOUNTER — Telehealth: Payer: Self-pay | Admitting: Cardiovascular Disease

## 2018-03-19 NOTE — Telephone Encounter (Signed)
Patient notified of lab results

## 2018-03-19 NOTE — Telephone Encounter (Signed)
Follow up    Patient calling back for echo results

## 2018-03-21 DIAGNOSIS — N186 End stage renal disease: Secondary | ICD-10-CM | POA: Diagnosis not present

## 2018-03-21 DIAGNOSIS — E1022 Type 1 diabetes mellitus with diabetic chronic kidney disease: Secondary | ICD-10-CM | POA: Diagnosis not present

## 2018-03-21 DIAGNOSIS — L97822 Non-pressure chronic ulcer of other part of left lower leg with fat layer exposed: Secondary | ICD-10-CM | POA: Diagnosis not present

## 2018-03-21 DIAGNOSIS — E1051 Type 1 diabetes mellitus with diabetic peripheral angiopathy without gangrene: Secondary | ICD-10-CM | POA: Diagnosis not present

## 2018-03-21 DIAGNOSIS — T8189XA Other complications of procedures, not elsewhere classified, initial encounter: Secondary | ICD-10-CM | POA: Diagnosis not present

## 2018-03-21 DIAGNOSIS — E10622 Type 1 diabetes mellitus with other skin ulcer: Secondary | ICD-10-CM | POA: Diagnosis not present

## 2018-03-21 DIAGNOSIS — I2510 Atherosclerotic heart disease of native coronary artery without angina pectoris: Secondary | ICD-10-CM | POA: Diagnosis not present

## 2018-04-12 ENCOUNTER — Ambulatory Visit (HOSPITAL_COMMUNITY)
Admission: RE | Admit: 2018-04-12 | Payer: Medicare Other | Source: Ambulatory Visit | Attending: Physician Assistant | Admitting: Physician Assistant

## 2018-04-17 ENCOUNTER — Ambulatory Visit (HOSPITAL_COMMUNITY)
Admission: RE | Admit: 2018-04-17 | Discharge: 2018-04-17 | Disposition: A | Discharge status: Home / Self Care | Payer: Medicare Other | Source: Ambulatory Visit | Attending: Internal Medicine | Admitting: Internal Medicine

## 2018-04-17 DIAGNOSIS — R0989 Other specified symptoms and signs involving the circulatory and respiratory systems: Secondary | ICD-10-CM

## 2018-04-17 DIAGNOSIS — E119 Type 2 diabetes mellitus without complications: Secondary | ICD-10-CM | POA: Diagnosis not present

## 2018-04-17 DIAGNOSIS — E785 Hyperlipidemia, unspecified: Secondary | ICD-10-CM | POA: Diagnosis not present

## 2018-04-17 DIAGNOSIS — I10 Essential (primary) hypertension: Secondary | ICD-10-CM | POA: Diagnosis not present

## 2018-04-17 DIAGNOSIS — I6523 Occlusion and stenosis of bilateral carotid arteries: Secondary | ICD-10-CM

## 2018-04-17 DIAGNOSIS — Z87891 Personal history of nicotine dependence: Secondary | ICD-10-CM | POA: Diagnosis not present

## 2018-04-17 DIAGNOSIS — R269 Unspecified abnormalities of gait and mobility: Secondary | ICD-10-CM | POA: Insufficient documentation

## 2018-04-17 DIAGNOSIS — I2510 Atherosclerotic heart disease of native coronary artery without angina pectoris: Secondary | ICD-10-CM | POA: Insufficient documentation

## 2018-04-18 ENCOUNTER — Other Ambulatory Visit: Payer: Self-pay

## 2018-04-18 DIAGNOSIS — I6523 Occlusion and stenosis of bilateral carotid arteries: Secondary | ICD-10-CM

## 2018-04-18 NOTE — Progress Notes (Signed)
carotid 

## 2018-04-29 DIAGNOSIS — I10 Essential (primary) hypertension: Secondary | ICD-10-CM | POA: Diagnosis not present

## 2018-04-29 DIAGNOSIS — N183 Chronic kidney disease, stage 3 (moderate): Secondary | ICD-10-CM | POA: Diagnosis not present

## 2018-04-29 DIAGNOSIS — E785 Hyperlipidemia, unspecified: Secondary | ICD-10-CM | POA: Diagnosis not present

## 2018-04-30 LAB — LIPID PANEL
CHOL/HDL RATIO: 3.3 ratio (ref 0.0–5.0)
Cholesterol, Total: 159 mg/dL (ref 100–199)
HDL: 48 mg/dL (ref >39)
LDL CALC: 100 mg/dL — AB (ref 0–99)
Triglycerides: 57 mg/dL (ref 0–149)
VLDL CHOLESTEROL CAL: 11 mg/dL (ref 5–40)

## 2018-05-03 ENCOUNTER — Ambulatory Visit (INDEPENDENT_AMBULATORY_CARE_PROVIDER_SITE_OTHER): Payer: Medicare Other | Admitting: Cardiovascular Disease

## 2018-05-03 ENCOUNTER — Encounter: Payer: Self-pay | Admitting: Cardiovascular Disease

## 2018-05-03 DIAGNOSIS — I998 Other disorder of circulatory system: Secondary | ICD-10-CM

## 2018-05-03 DIAGNOSIS — I6523 Occlusion and stenosis of bilateral carotid arteries: Secondary | ICD-10-CM

## 2018-05-03 DIAGNOSIS — I70229 Atherosclerosis of native arteries of extremities with rest pain, unspecified extremity: Secondary | ICD-10-CM

## 2018-05-03 NOTE — Assessment & Plan Note (Signed)
Critical limb ischemia with a wound on his left lower extremity that is healed because of the excellent care that he received at the wound care center.  His lower extremity arterial Doppler studies performed 02/26/2018 revealed a right ABI of 1.18 in the left 0.73.  He did have a high-frequency signal in his mid left SFA with one vessel runoff.  He does complain of claudication bilaterally but appears to have moderate renal insufficiency with a serum creatinine in the high 1 range and he is a diabetic placing him at high risk for radiocontrast nephropathy.  We will look at his most recent lab work prior to making this a decision with regards to angiography and intervention.

## 2018-05-03 NOTE — Patient Instructions (Signed)

## 2018-05-03 NOTE — Progress Notes (Signed)
Critical limb ischemia with a wound on his left lower extremity that is healed because of the excellent care that he received at the wound care center.  His lower extremity arterial Doppler studies performed 02/26/2018 revealed a right ABI of 1.18 in the left 0.73.  He did have a high-frequency signal in his mid left SFA with one vessel runoff.  He does complain of claudication bilaterally but appears to have moderate renal insufficiency with a serum creatinine in the high 1 range and he is a diabetic placing him at high risk for radiocontrast nephropathy.  We will look at his most recent lab work prior to making this a decision with regards to angiography and intervention.  Runell Gess, M.D., FACP, [facility], [facility] [address] [place], [place]  [address]  [phone] 05/03/2018 10:38 AM

## 2018-05-06 DIAGNOSIS — I10 Essential (primary) hypertension: Secondary | ICD-10-CM | POA: Diagnosis not present

## 2018-05-06 DIAGNOSIS — N183 Chronic kidney disease, stage 3 (moderate): Secondary | ICD-10-CM | POA: Diagnosis not present

## 2018-05-08 ENCOUNTER — Telehealth: Payer: Self-pay | Admitting: Cardiovascular Disease

## 2018-05-08 NOTE — Telephone Encounter (Signed)
New Message:       Pt is calling to see if he will need to get more blood work done since he had to reschedule his appt

## 2018-05-08 NOTE — Telephone Encounter (Signed)
Spoke with pt, he is wondering if he needs to have his cholesterol checked again prior to his appt in September. Patient made aware these labs will be fine, no repeat needed.

## 2018-05-09 DIAGNOSIS — I10 Essential (primary) hypertension: Secondary | ICD-10-CM | POA: Diagnosis not present

## 2018-05-09 DIAGNOSIS — E559 Vitamin D deficiency, unspecified: Secondary | ICD-10-CM | POA: Diagnosis not present

## 2018-05-09 DIAGNOSIS — N183 Chronic kidney disease, stage 3 (moderate): Secondary | ICD-10-CM | POA: Diagnosis not present

## 2018-05-09 DIAGNOSIS — N390 Urinary tract infection, site not specified: Secondary | ICD-10-CM | POA: Diagnosis not present

## 2018-05-10 ENCOUNTER — Ambulatory Visit: Payer: Medicare Other | Admitting: Cardiovascular Disease

## 2018-05-13 ENCOUNTER — Ambulatory Visit (INDEPENDENT_AMBULATORY_CARE_PROVIDER_SITE_OTHER): Payer: Medicare Other | Admitting: Podiatry

## 2018-05-13 DIAGNOSIS — M79676 Pain in unspecified toe(s): Secondary | ICD-10-CM | POA: Diagnosis not present

## 2018-05-13 DIAGNOSIS — E1151 Type 2 diabetes mellitus with diabetic peripheral angiopathy without gangrene: Secondary | ICD-10-CM

## 2018-05-13 DIAGNOSIS — B351 Tinea unguium: Secondary | ICD-10-CM

## 2018-05-14 NOTE — Progress Notes (Signed)
Subjective: 77 y.o. returns the office today for painful, elongated, thickened toenails which he cannot trim himself as well as for calluses. Denies any redness or drainage around the nails or calluses. Denies any acute changes since last appointment and no new complaints today. Denies any systemic complaints such as fevers, chills, nausea, vomiting.   PCP: Romero Belling, MD  Last A1c was 9.6 03/06/2018  Objective: AAO 3, NAD DP/PT pulses 1/4, CRT less than 3 seconds Sensation decreased bilaterally. Nails hypertrophic, dystrophic, elongated, brittle, discolored 10. There is tenderness overlying the nails 1-5 bilaterally. There is no surrounding erythema or drainage along the nail sites. Hyperkeratotic lesions are present submetatarsal 5 bilaterally.  No underlying ulceration, drainage or any signs of infection. No open lesions or pre-ulcerative lesions are identified. No pain with calf compression, swelling, warmth, erythema.  Assessment: Patient presents with symptomatic onychomycosis, hyperkeratotic lesions  Plan: -Treatment options including alternatives, risks, complications were discussed -Nails sharply debrided 10 without complication/bleeding. -Hyperkeratotic lesion Sharpy debrided x2 without any complications or bleeding. -Discussed daily foot inspection. If there are any changes, to call the office immediately.  -Follow-up in 3 months or sooner if any problems are to arise. In the meantime, encouraged to call the office with any questions, concerns, changes symptoms.  [name], DPM

## 2018-05-26 ENCOUNTER — Other Ambulatory Visit: Payer: Self-pay | Admitting: Cardiovascular Disease

## 2018-05-28 NOTE — Telephone Encounter (Signed)
Rx(s) sent to pharmacy electronically.  

## 2018-06-17 ENCOUNTER — Encounter: Payer: Self-pay | Admitting: Endocrinology

## 2018-06-17 ENCOUNTER — Ambulatory Visit (INDEPENDENT_AMBULATORY_CARE_PROVIDER_SITE_OTHER): Payer: Medicare Other | Admitting: Endocrinology

## 2018-06-17 VITALS — BP 120/68 | HR 72 | Wt 162.6 lb

## 2018-06-17 DIAGNOSIS — E1065 Type 1 diabetes mellitus with hyperglycemia: Secondary | ICD-10-CM | POA: Diagnosis not present

## 2018-06-17 DIAGNOSIS — I6523 Occlusion and stenosis of bilateral carotid arteries: Secondary | ICD-10-CM | POA: Diagnosis not present

## 2018-06-17 DIAGNOSIS — E1029 Type 1 diabetes mellitus with other diabetic kidney complication: Secondary | ICD-10-CM | POA: Diagnosis not present

## 2018-06-17 DIAGNOSIS — IMO0002 Reserved for concepts with insufficient information to code with codable children: Secondary | ICD-10-CM

## 2018-06-17 LAB — POCT GLYCOSYLATED HEMOGLOBIN (HGB A1C): Hemoglobin A1C: 10.6 % — AB (ref 4.0–5.6)

## 2018-06-17 NOTE — Patient Instructions (Addendum)
Please continue the same insulin.   check your blood sugar 4 times a day: before the 3 meals, and at bedtime.  also check if you have symptoms of your blood sugar being too high or too low.  please keep a record of the readings and bring it to your next appointment here (or you can bring the meter itself).  You can write it on any piece of paper.  please call us sooner if your blood sugar goes below 70, or if you have a lot of readings over 200.   Please come back for a follow-up appointment in 3 months.  

## 2018-06-17 NOTE — Progress Notes (Signed)
Subjective:    Patient ID: Henry LessGlenn T Lessley Jr., male    DOB: 14-Sep-1941, 77 y.o.   MRN: [id number]  HPI Pt returns for f/u of diabetes mellitus: DM type: 1 Dx'ed: 1961 Complications: renal insufficiency, CAD, PAD, and retinopathy.   Therapy: insulin since dx.  DKA: never.   Severe hypoglycemia: last episode was 2016.   Pancreatitis: never.  Other: after poor results with multiple daily injections, he was changed to 70/30; he has declined pump and continuous glucose monitor; pt says he cannot afford insulin analogs.  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 Interval history: He brings a record of his cbg's which I have reviewed today.  It varies from 68-294.  There is no trend throughout the day.  pt states he feels well in general.   Past Medical History:  Diagnosis Date  . ANEMIA-NOS 07/10/2007  . CORONARY ARTERY DISEASE 07/10/2007  . DIABETES MELLITUS, TYPE I 07/10/2007  . HYPERLIPIDEMIA 07/10/2007  . HYPERTENSION 07/10/2007  . Peripheral arterial disease (HCC)   . 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 RENAL INSUFFICIENCY 07/10/2007    Past Surgical History:  Procedure Laterality Date  . APPENDECTOMY    . CARDIAC CATHETERIZATION     stenting in the vein graft to the right coronary artery and had_DES 3x 26 mm Integrity stent inserted, post dilated to 3.4 mm  . CAROTID ENDARTERECTOMY Right    right  . CHOLECYSTECTOMY N/A 09/13/2017   Procedure: LAPAROSCOPIC CHOLECYSTECTOMY;  Surgeon: Kinsinger, De BlanchLuke Aaron, MD;  Location: [facility];  Service: General;  Laterality: N/A;  . CORONARY ARTERY BYPASS GRAFT  02/2010   PTCA of his right coroary artery.   . ESOPHAGOGASTRODUODENOSCOPY N/A 09/11/2017   Procedure: ESOPHAGOGASTRODUODENOSCOPY (EGD);  Surgeon: Hilarie FredricksonPerry, John N, MD;  [facility];  Service: Endoscopy;  Laterality: N/A;    Social History   Socioeconomic History  . Marital status: Married   Spouse name: Not on file  . Number of children: Not on file  . Years of education: BS  . Highest education level: Not on file  Occupational History  . Occupation: retired    Comment: 14 yrs ago  Social Needs  . Financial resource strain: Not on file  . Food insecurity:    Worry: Not on file    Inability: Not on file  . Transportation needs:    Medical: Not on file    Non-medical: Not on file  Tobacco Use  . Smoking status: Former Games developermoker  . Smokeless tobacco: Never Used  Substance and Sexual Activity  . Alcohol use: No  . Drug use: No  . Sexual activity: Not on file  Lifestyle  . Physical activity:    Days per week: Not on file    Minutes per session: Not on file  . Stress: Not on file  Relationships  . Social connections:    Talks on phone: Not on file    Gets together: Not on file    Attends religious service: Not on file    Active member of club or organization: Not on file    Attends meetings of clubs or organizations: Not on file    Relationship status: Not on file  . Intimate partner violence:    Fear of current or ex partner: Not on file    Emotionally abused: Not on file    Physically abused: Not on file    Forced sexual activity: Not on file  Other Topics  Concern  . Not on file  Social History Narrative   Says his diet and exercise are very good   Married lives with wife    Current Outpatient Medications on File Prior to Visit  Medication Sig Dispense Refill  . acetaminophen (TYLENOL) 500 MG tablet Take 500 mg by mouth daily as needed (pain).    Marland Kitchen aspirin EC 81 MG tablet Take 81 mg by mouth daily.    . B Complex-C-Folic Acid (FOLBEE PLUS) TABS Take 1 tablet by mouth daily.      . Cholecalciferol (VITAMIN D) 2000 UNITS CAPS Take 2,000 Units by mouth daily.     . furosemide (LASIX) 20 MG tablet Take 20 mg by mouth.    . gabapentin (NEURONTIN) 100 MG capsule Take 100 mg by mouth 2 (two) times daily.     . insulin NPH-regular Human (NOVOLIN 70/30) (70-30)  100 UNIT/ML injection 43 units with breakfast and 26 units with supper 20 mL 11  . isosorbide mononitrate (IMDUR) 60 MG 24 hr tablet TAKE 1 TABLET BY MOUTH DAILY 30 tablet 9  . labetalol (NORMODYNE) 200 MG tablet Take 1 tablet (200 mg total) by mouth 2 (two) times daily. NEED OV. 60 tablet 0  . omeprazole (PRILOSEC OTC) 20 MG tablet Take 20 mg by mouth daily.    . hydrochlorothiazide (MICROZIDE) 12.5 MG capsule Take 12.5 mg by mouth daily.    Marland Kitchen HYDROcodone-acetaminophen (NORCO) 5-325 MG tablet Take 1 tablet by mouth every 6 (six) hours as needed for moderate pain. (Patient not taking: Reported on 06/17/2018) 20 tablet 0  . polyethylene glycol (MIRALAX / GLYCOLAX) packet Take 17 g by mouth daily as needed. (Patient not taking: Reported on 06/17/2018) 5 each 0   No current facility-administered medications on file prior to visit.     Allergies  Allergen Reactions  . Hydralazine Swelling    Feet swelled so bad they were cracking Severe foot swelling  . Penicillins Other (See Comments)    Per allergy test.  . Amlodipine Swelling  . Amlodipine Besylate Swelling  . Codeine Other (See Comments)    anxiety  . Contrast Media [Iodinated Diagnostic Agents]     Kidney issues when given  . Lipitor [Atorvastatin Calcium] Other (See Comments)    myalgia  . Valsartan Other (See Comments) and Swelling    Caused kidney damage  . Zetia [Ezetimibe] Other (See Comments)    Severe dizziness  . Penicillin G Rash    Family History  Problem Relation Age of Onset  . Heart disease Mother   . Cancer Neg Hx     BP 120/68   Pulse 72   Wt 162 lb 9.6 oz (73.8 kg)   SpO2 97%   BMI 23.33 kg/m    Review of Systems Denies LOC    Objective:   Physical Exam VITAL SIGNS:  See vs page.  GENERAL: no distress.  Pulses: dorsalis pedis absent bilat.  Skin: feet are of normal temp.  left leg is bandaged. There is patchy hyperpigmentation. There is an old healed vein harvest scar at the left leg.   Feet: no  deformity. There is bilateral onychomycosis.   CV: no leg edema.  Neuro: sensation is intact to touch on the feet, but severely decreased from normal.     Lab Results  Component Value Date   HGBA1C 10.6 (A) 06/17/2018   Lab Results  Component Value Date   CREATININE 1.79 (H) 09/15/2017   BUN 35 (H) 09/15/2017  NA 135 09/15/2017   K 3.4 (L) 09/15/2017   CL 106 09/15/2017   CO2 22 09/15/2017      Assessment & Plan:  Type 1 DM, with PAD: worse.  Due to widely varying cbg's, he is not a candidate for tight glycemic control.   Renal failure: this increases the duration of action of insulin.    Patient Instructions  Please continue the same insulin.  check your blood sugar 4 times a day: before the 3 meals, and at bedtime.  also check if you have symptoms of your blood sugar being too high or too low.  please keep a record of the readings and bring it to your next appointment here (or you can bring the meter itself).  You can write it on any piece of paper.  please call us sooner if your blood sugar goes below 70, or if you have a lot of readings over 200. Please come back for a follow-up appointment in 3 months.

## 2018-07-03 ENCOUNTER — Encounter

## 2018-07-18 ENCOUNTER — Ambulatory Visit (INDEPENDENT_AMBULATORY_CARE_PROVIDER_SITE_OTHER): Payer: Medicare Other | Admitting: Podiatry

## 2018-07-18 ENCOUNTER — Encounter: Payer: Self-pay | Admitting: Podiatry

## 2018-07-18 DIAGNOSIS — Q828 Other specified congenital malformations of skin: Secondary | ICD-10-CM | POA: Diagnosis not present

## 2018-07-18 DIAGNOSIS — B351 Tinea unguium: Secondary | ICD-10-CM

## 2018-07-18 DIAGNOSIS — M79676 Pain in unspecified toe(s): Secondary | ICD-10-CM | POA: Diagnosis not present

## 2018-07-18 DIAGNOSIS — E1151 Type 2 diabetes mellitus with diabetic peripheral angiopathy without gangrene: Secondary | ICD-10-CM | POA: Diagnosis not present

## 2018-07-21 NOTE — Progress Notes (Signed)
Subjective: 77 y.o. returns the office today for painful, elongated, thickened toenails which he cannot trim himself as well as for calluses. Denies any redness or drainage around the nails or calluses. Denies any acute changes since last appointment and no new complaints today. Denies any systemic complaints such as fevers, chills, nausea, vomiting.   PCP: Romero BellingEllison, Sean, MD  Last A1c was 10.6 06/17/2018  Objective: AAO 3, NAD DP/PT pulses 1/4, CRT less than 3 seconds Sensation decreased bilaterally. Nails hypertrophic, dystrophic, elongated, brittle, discolored 10. There is tenderness overlying the nails 1-5 bilaterally. There is no surrounding erythema or drainage along the nail sites. Hyperkeratotic lesions are present submetatarsal 5 bilaterally.  No underlying ulceration, drainage or any signs of infection. No open lesions or pre-ulcerative lesions are identified. No pain with calf compression, swelling, warmth, erythema.  Assessment: Patient presents with symptomatic onychomycosis, hyperkeratotic lesions  Plan: -Treatment options including alternatives, risks, complications were discussed -Nails sharply debrided 10 without complication/bleeding. -Hyperkeratotic lesion Sharpy debrided x2 without any complications or bleeding. -Discussed daily foot inspection. If there are any changes, to call the office immediately.  -Follow-up in 3 months or sooner if any problems are to arise. In the meantime, encouraged to call the office with any questions, concerns, changes symptoms.  Ovid CurdMatthew Wagoner, DPM

## 2018-07-26 ENCOUNTER — Emergency Department (HOSPITAL_BASED_OUTPATIENT_CLINIC_OR_DEPARTMENT_OTHER): Payer: Medicare Other

## 2018-07-26 ENCOUNTER — Other Ambulatory Visit: Payer: Self-pay

## 2018-07-26 ENCOUNTER — Observation Stay (HOSPITAL_BASED_OUTPATIENT_CLINIC_OR_DEPARTMENT_OTHER)
Admission: EM | Admit: 2018-07-26 | Discharge: 2018-07-28 | Disposition: A | Discharge status: Home / Self Care | Payer: Medicare Other | Attending: Internal Medicine | Admitting: Internal Medicine

## 2018-07-26 ENCOUNTER — Encounter (HOSPITAL_BASED_OUTPATIENT_CLINIC_OR_DEPARTMENT_OTHER): Payer: Self-pay

## 2018-07-26 DIAGNOSIS — E1022 Type 1 diabetes mellitus with diabetic chronic kidney disease: Secondary | ICD-10-CM | POA: Diagnosis not present

## 2018-07-26 DIAGNOSIS — R2689 Other abnormalities of gait and mobility: Secondary | ICD-10-CM | POA: Insufficient documentation

## 2018-07-26 DIAGNOSIS — N179 Acute kidney failure, unspecified: Secondary | ICD-10-CM | POA: Insufficient documentation

## 2018-07-26 DIAGNOSIS — I739 Peripheral vascular disease, unspecified: Secondary | ICD-10-CM | POA: Diagnosis present

## 2018-07-26 DIAGNOSIS — R42 Dizziness and giddiness: Secondary | ICD-10-CM | POA: Diagnosis not present

## 2018-07-26 DIAGNOSIS — Z79899 Other long term (current) drug therapy: Secondary | ICD-10-CM | POA: Insufficient documentation

## 2018-07-26 DIAGNOSIS — E1029 Type 1 diabetes mellitus with other diabetic kidney complication: Secondary | ICD-10-CM | POA: Diagnosis present

## 2018-07-26 DIAGNOSIS — I129 Hypertensive chronic kidney disease with stage 1 through stage 4 chronic kidney disease, or unspecified chronic kidney disease: Secondary | ICD-10-CM | POA: Insufficient documentation

## 2018-07-26 DIAGNOSIS — Z794 Long term (current) use of insulin: Secondary | ICD-10-CM | POA: Diagnosis not present

## 2018-07-26 DIAGNOSIS — N189 Chronic kidney disease, unspecified: Secondary | ICD-10-CM

## 2018-07-26 DIAGNOSIS — R270 Ataxia, unspecified: Secondary | ICD-10-CM

## 2018-07-26 DIAGNOSIS — E1065 Type 1 diabetes mellitus with hyperglycemia: Secondary | ICD-10-CM

## 2018-07-26 DIAGNOSIS — I10 Essential (primary) hypertension: Secondary | ICD-10-CM | POA: Diagnosis present

## 2018-07-26 DIAGNOSIS — N183 Chronic kidney disease, stage 3 (moderate): Secondary | ICD-10-CM | POA: Diagnosis not present

## 2018-07-26 DIAGNOSIS — IMO0002 Reserved for concepts with insufficient information to code with codable children: Secondary | ICD-10-CM | POA: Diagnosis present

## 2018-07-26 LAB — URINALYSIS, ROUTINE W REFLEX MICROSCOPIC
Bilirubin Urine: NEGATIVE
Hgb urine dipstick: NEGATIVE
Ketones, ur: 15 mg/dL — AB
LEUKOCYTES UA: NEGATIVE
Nitrite: NEGATIVE
Protein, ur: 30 mg/dL — AB
SPECIFIC GRAVITY, URINE: 1.015 (ref 1.005–1.030)
pH: 5.5 (ref 5.0–8.0)

## 2018-07-26 LAB — URINALYSIS, MICROSCOPIC (REFLEX)

## 2018-07-26 LAB — COMPREHENSIVE METABOLIC PANEL
ALT: 21 U/L (ref 0–44)
AST: 23 U/L (ref 15–41)
Albumin: 3.7 g/dL (ref 3.5–5.0)
Alkaline Phosphatase: 83 U/L (ref 38–126)
Anion gap: 9 (ref 5–15)
BILIRUBIN TOTAL: 0.6 mg/dL (ref 0.3–1.2)
BUN: 38 mg/dL — AB (ref 8–23)
CHLORIDE: 103 mmol/L (ref 98–111)
CO2: 22 mmol/L (ref 22–32)
Calcium: 8.7 mg/dL — ABNORMAL LOW (ref 8.9–10.3)
Creatinine, Ser: 2.59 mg/dL — ABNORMAL HIGH (ref 0.61–1.24)
GFR calc Af Amer: 26 mL/min — ABNORMAL LOW (ref >60)
GFR calc non Af Amer: 22 mL/min — ABNORMAL LOW (ref >60)
Glucose, Bld: 511 mg/dL (ref 70–99)
POTASSIUM: 4.3 mmol/L (ref 3.5–5.1)
Sodium: 134 mmol/L — ABNORMAL LOW (ref 135–145)
TOTAL PROTEIN: 7 g/dL (ref 6.5–8.1)

## 2018-07-26 LAB — CBC WITH DIFFERENTIAL/PLATELET
BASOS ABS: 0 10*3/uL (ref 0.0–0.1)
Basophils Relative: 1 %
Eosinophils Absolute: 0.3 10*3/uL (ref 0.0–0.7)
Eosinophils Relative: 5 %
HEMATOCRIT: 32.1 % — AB (ref 39.0–52.0)
Hemoglobin: 11 g/dL — ABNORMAL LOW (ref 13.0–17.0)
LYMPHS ABS: 1.7 10*3/uL (ref 0.7–4.0)
LYMPHS PCT: 28 %
MCH: 30.5 pg (ref 26.0–34.0)
MCHC: 34.3 g/dL (ref 30.0–36.0)
MCV: 88.9 fL (ref 78.0–100.0)
MONO ABS: 1 10*3/uL (ref 0.1–1.0)
Monocytes Relative: 17 %
NEUTROS ABS: 3 10*3/uL (ref 1.7–7.7)
Neutrophils Relative %: 49 %
Platelets: 226 10*3/uL (ref 150–400)
RBC: 3.61 MIL/uL — ABNORMAL LOW (ref 4.22–5.81)
RDW: 13.7 % (ref 11.5–15.5)
WBC: 5.9 10*3/uL (ref 4.0–10.5)

## 2018-07-26 LAB — CBG MONITORING, ED
GLUCOSE-CAPILLARY: 467 mg/dL — AB (ref 70–99)
GLUCOSE-CAPILLARY: 305 mg/dL — AB (ref 70–99)

## 2018-07-26 LAB — TROPONIN I: Troponin I: <0.03 ng/mL (ref <0.03)

## 2018-07-26 MED ORDER — SODIUM CHLORIDE 0.9 % IV BOLUS
1000.0000 mL | Freq: Once | INTRAVENOUS | Status: AC
  Administered 2018-07-26: 1000 mL via INTRAVENOUS

## 2018-07-26 MED ORDER — LABETALOL HCL 200 MG PO TABS
200.0000 mg | ORAL_TABLET | Freq: Two times a day (BID) | ORAL | Status: DC
  Filled 2018-07-26: qty 1

## 2018-07-26 MED ORDER — LACTATED RINGERS IV BOLUS
1000.0000 mL | Freq: Once | INTRAVENOUS | Status: AC
  Administered 2018-07-26: 1000 mL via INTRAVENOUS

## 2018-07-26 MED ORDER — HYDROCHLOROTHIAZIDE 12.5 MG PO CAPS
12.5000 mg | ORAL_CAPSULE | Freq: Every day | ORAL | Status: DC
  Filled 2018-07-26: qty 1

## 2018-07-26 MED ORDER — INSULIN REGULAR HUMAN 100 UNIT/ML IJ SOLN
8.0000 [IU] | Freq: Once | INTRAMUSCULAR | Status: AC
  Administered 2018-07-26: 8 [IU] via SUBCUTANEOUS
  Filled 2018-07-26: qty 1

## 2018-07-26 MED ORDER — GABAPENTIN 100 MG PO CAPS
100.0000 mg | ORAL_CAPSULE | Freq: Two times a day (BID) | ORAL | Status: DC
  Administered 2018-07-26 – 2018-07-28 (×4): 100 mg via ORAL
  Filled 2018-07-26 (×4): qty 1

## 2018-07-26 MED ORDER — HYDROCHLOROTHIAZIDE 25 MG PO TABS
ORAL_TABLET | ORAL | Status: AC
  Administered 2018-07-26: 25 mg
  Filled 2018-07-26: qty 1

## 2018-07-26 MED ORDER — INSULIN ASPART PROT & ASPART (70-30 MIX) 100 UNIT/ML ~~LOC~~ SUSP
SUBCUTANEOUS | Status: AC
  Filled 2018-07-26: qty 10

## 2018-07-26 MED ORDER — INSULIN ASPART PROT & ASPART (70-30 MIX) 100 UNIT/ML ~~LOC~~ SUSP
26.0000 [IU] | Freq: Once | SUBCUTANEOUS | Status: AC
  Administered 2018-07-26: 26 [IU] via SUBCUTANEOUS

## 2018-07-26 MED ORDER — LABETALOL HCL 5 MG/ML IV SOLN
5.0000 mg | Freq: Once | INTRAVENOUS | Status: AC
  Administered 2018-07-26: 5 mg via INTRAVENOUS
  Filled 2018-07-26: qty 4

## 2018-07-26 NOTE — ED Notes (Signed)
Report attempted, RN to call back for report. 

## 2018-07-26 NOTE — ED Provider Notes (Signed)
Emergency Department Provider Note   I have reviewed the triage vital signs and the nursing notes.   HISTORY  Chief Complaint Dizziness   HPI Henry LessGlenn T Pino Jr. is a 77 y.o. male with multiple medical problems as documented below the presents to the emergency department today secondary to difficulty walking and falling.  Patient states that he has had for the last week multiple episodes of veering to the left without being I would control it.  He had 2 episodes of falling where he is steady and does not feel exam pass out.  He has no lightheadedness or dizziness but just all of a sudden loses his balance and falls backwards to the left.  He feels like he wants to try to stop it but there is nothing he can do to stop it and is falling down.  Has not syncopized.  No starting chest pain, shortness of breath, diaphoresis or other symptoms.  Patient states he had some similar episodes in the past and had extensive work-up without any changes.  He has a history of carotid endarterectomy multiple years ago.  He has peripheral vascular disease and also has a stent. No other associated or modifying symptoms.    Past Medical History:  Diagnosis Date  . ANEMIA-NOS 07/10/2007  . CORONARY ARTERY DISEASE 07/10/2007  . DIABETES MELLITUS, TYPE I 07/10/2007  . HYPERLIPIDEMIA 07/10/2007  . HYPERTENSION 07/10/2007  . Peripheral arterial disease (HCC)   . 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 RENAL INSUFFICIENCY 07/10/2007    Patient Active Problem List   Diagnosis Date Noted  . Ataxia 07/26/2018  . Critical lower limb ischemia 03/01/2018  . Leg ulcer (HCC) 12/05/2017  . 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 Diabetic gastroparesis (HCC)   . Abdominal pain 09/10/2017  . Facial problem 08/06/2017  . CKD (chronic kidney disease), stage III (HCC) 05/17/2017  . Transient alteration of awareness 05/17/2017  . Hypoglycemia due to insulin 05/17/2017  .  Peripheral arterial disease (HCC) 11/04/2014  . Claudication (HCC) 09/24/2014  . Premature ventricular contractions (PVCs) (VPCs) 04/06/2014  . NSVT (nonsustained ventricular tachycardia) (HCC) 04/06/2014  . Diabetic peripheral neuropathy (HCC) 02/04/2014  . Dizziness 07/10/2013  . Diabetes (HCC) 06/13/2013  . 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 Screening for prostate cancer 05/12/2011  . Pure hypercholesterolemia 05/12/2011  . Encounter for long-term (current) use of other medications 05/12/2011  . 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 PROLIFERATIVE DIABETIC RETINOPATHY 05/05/2010  . Dyslipidemia 07/10/2007  . Anemia 07/10/2007  . Essential hypertension 07/10/2007  . Coronary atherosclerosis 07/10/2007  . Disorder resulting from impaired renal function 07/10/2007    Past Surgical History:  Procedure Laterality Date  . APPENDECTOMY    . CARDIAC CATHETERIZATION     stenting in the vein graft to the right coronary artery and had_DES 3x 26 mm Integrity stent inserted, post dilated to 3.4 mm  . CAROTID ENDARTERECTOMY Right    right  . CHOLECYSTECTOMY N/A 09/13/2017   Procedure: LAPAROSCOPIC CHOLECYSTECTOMY;  Surgeon: Kinsinger, De BlanchLuke Aaron, MD;  Location: [facility];  Service: General;  Laterality: N/A;  . CORONARY ARTERY BYPASS GRAFT  02/2010   PTCA of his right coroary artery.   . ESOPHAGOGASTRODUODENOSCOPY N/A 09/11/2017   Procedure: ESOPHAGOGASTRODUODENOSCOPY (EGD);  Surgeon: Hilarie FredricksonPerry, John N, MD;  [facility];  Service: Endoscopy;  Laterality: N/A;      Allergies Hydralazine; Penicillins; Amlodipine; Amlodipine besylate; Codeine; Contrast media [iodinated diagnostic agents];  Lipitor [atorvastatin calcium]; Valsartan; Zetia [ezetimibe]; and Penicillin g  Family History  Problem Relation Age of Onset  . Heart disease Mother   . Cancer Neg Hx     Social History Social History   Tobacco Use  . Smoking status: Former  Games developer  . Smokeless tobacco: Never Used  Substance Use Topics  . Alcohol use: No  . Drug use: No    Review of Systems  All other systems negative except as documented in the HPI. All pertinent positives and negatives as reviewed in the HPI. ____________________________________________   PHYSICAL EXAM:  VITAL SIGNS: ED Triage Vitals  Enc Vitals Group     BP 07/26/18 1622 (!) 144/64     Pulse Rate 07/26/18 1622 73     Resp 07/26/18 1622 18     Temp 07/26/18 1622 98.3 F (36.8 C)     Temp Source 07/26/18 1622 Oral     SpO2 07/26/18 1622 99 %     Weight 07/26/18 1621 165 lb (74.8 kg)     Height 07/26/18 1621 5\' 10"  (1.778 m)    Constitutional: Alert and oriented. Well appearing and in no acute distress. Eyes: Conjunctivae are normal. PERRL. EOMI. Head: Atraumatic. Nose: No congestion/rhinnorhea. Mouth/Throat: Mucous membranes are moist.  Oropharynx non-erythematous. Neck: No stridor.  No meningeal signs.   Cardiovascular: Normal rate, regular rhythm. Good peripheral circulation. Grossly normal heart sounds.   Respiratory: Normal respiratory effort.  No retractions. Lungs CTAB. Gastrointestinal: Soft and nontender. No distention.  Musculoskeletal: No lower extremity tenderness nor edema. No gross deformities of extremities. Neurologic:  Normal speech and language. No gross focal neurologic deficits are appreciated. Less strength in left bicep. No CN abnormalities. No nystagmus.  Skin:  Skin is warm, dry and intact. No rash noted.   ____________________________________________   LABS (all labs ordered are listed, but only abnormal results are displayed)  Labs Reviewed  CBC WITH DIFFERENTIAL/PLATELET - Abnormal; Notable for the following components:      Result Value   RBC 3.61 (*)    Hemoglobin 11.0 (*)    HCT 32.1 (*)    All other components within normal limits  COMPREHENSIVE METABOLIC PANEL - Abnormal; Notable for the following components:   Sodium 134 (*)     Glucose, Bld 511 (*)    BUN 38 (*)    Creatinine, Ser 2.59 (*)    Calcium 8.7 (*)    GFR calc non Af Amer 22 (*)    GFR calc Af Amer 26 (*)    All other components within normal limits  URINALYSIS, ROUTINE W REFLEX MICROSCOPIC - Abnormal; Notable for the following components:   Glucose, UA >=500 (*)    Ketones, ur 15 (*)    Protein, ur 30 (*)    All other components within normal limits  URINALYSIS, MICROSCOPIC (REFLEX) - Abnormal; Notable for the following components:   Bacteria, UA RARE (*)    All other components within normal limits  CBG MONITORING, ED - Abnormal; Notable for the following components:   Glucose-Capillary 467 (*)    All other components within normal limits  CBG MONITORING, ED - Abnormal; Notable for the following components:   Glucose-Capillary 305 (*)    All other components within normal limits  TROPONIN I   ____________________________________________  EKG   EKG Interpretation  Date/Time:  Friday July 26 2018 16:23:26 EDT Ventricular Rate:  72 PR Interval:  162 QRS Duration: 92 QT Interval:  392 QTC Calculation: 429 R Axis:  72 Text Interpretation:  Normal sinus rhythm Normal ECG No significant change since last tracing in september Confirmed by Marily Memos [phone]) on 07/26/2018 5:38:02 PM       ____________________________________________  RADIOLOGY  Dg Chest 2 View  Result Date: 07/26/2018 CLINICAL DATA:  Dizziness for 1 week EXAM: CHEST - 2 VIEW COMPARISON:  09/12/2017 FINDINGS: Post sternotomy changes. No acute opacity or pleural effusion. Stable cardiomediastinal silhouette with aortic atherosclerosis. No pneumothorax. Mild scoliosis of the spine. IMPRESSION: No active cardiopulmonary disease. Electronically Signed   By: Jasmine Pang M.D.   On: 07/26/2018 17:48   Ct Head Wo Contrast  Result Date: 07/26/2018 CLINICAL DATA:  Dizziness with unsteady gait EXAM: CT HEAD WITHOUT CONTRAST TECHNIQUE: Contiguous axial images were obtained from  the base of the skull through the vertex without intravenous contrast. COMPARISON:  10/25/2017 MRI, CT brain 05/17/2017 FINDINGS: Brain: No evidence of acute infarction, hemorrhage, hydrocephalus, extra-axial collection or mass lesion/mass effect. Vascular: No hyperdense vessels. Vertebral artery and carotid vascular calcification Skull: Normal. Negative for fracture or focal lesion. Sinuses/Orbits: Mucosal thickening and retention cyst in the right maxillary sinus. Mild mucosal thickening in the ethmoid sinuses. Other: None IMPRESSION: 1. No CT evidence for acute intracranial abnormality. 2. Mild atrophy and minimal small vessel ischemic changes of the white matter Electronically Signed   By: Jasmine Pang M.D.   On: 07/26/2018 17:41    ____________________________________________    INITIAL IMPRESSION / ASSESSMENT AND PLAN / ED COURSE  Concern for possible cerebellar issues.  He has a systolic murmur and what could be bruits or a murmur that is transmitted to his bilateral carotid arteries worse on the right.  We will get a CT of his head, labs and carotid ultrasound  Can't do carotid ultrasound.   Patient has had some elevated creatinine compared to his baseline so fluids were given to see if this helped him walk better however he still unable to walk.  Does fall to the left and has just 0 balance.  Not we can anyway just does not have balance.  I discussed the case with Dr. Jerrell Belfast who thinks patient would likely benefit from a MRI and MRA secondary to his kidney function if these were negative but they can continue outpatient work-up.  Discussed with Dr. Toniann Fail who will admit to the hospital.   Pertinent labs & imaging results that were available during my care of the patient were reviewed by me and considered in my medical decision making (see chart for details).  ____________________________________________  FINAL CLINICAL IMPRESSION(S) / ED DIAGNOSES  Final diagnoses:  Dizziness      MEDICATIONS GIVEN DURING THIS VISIT:  Medications  gabapentin (NEURONTIN) capsule 100 mg (100 mg Oral Given 07/26/18 2054)  hydrochlorothiazide (MICROZIDE) capsule 12.5 mg (0 mg Oral Hold 07/26/18 2055)  insulin aspart protamine- aspart (NOVOLOG MIX 70/30) (70-30) 100 UNIT/ML injection (0 Units  Hold 07/26/18 2109)  sodium chloride 0.9 % bolus 1,000 mL ( Intravenous Stopped 07/26/18 1952)  insulin regular (NOVOLIN R,HUMULIN R) 100 units/mL injection 8 Units (8 Units Subcutaneous Given 07/26/18 1836)  lactated ringers bolus 1,000 mL ( Intravenous Stopped 07/26/18 2212)  insulin aspart protamine- aspart (NOVOLOG MIX 70/30) injection 26 Units (26 Units Subcutaneous Given 07/26/18 2100)  hydrochlorothiazide (HYDRODIURIL) 25 MG tablet (25 mg  Given 07/26/18 2055)  labetalol (NORMODYNE,TRANDATE) injection 5 mg (5 mg Intravenous Given 07/26/18 2100)    NEW OUTPATIENT MEDICATIONS STARTED DURING THIS VISIT:  Current Discharge Medication List  Note:  This note was prepared with assistance of Dragon voice recognition software. Occasional wrong-word or sound-a-like substitutions may have occurred due to the inherent limitations of voice recognition software.   Keyle Doby, Barbara Cower, MD 07/27/18 [phone]

## 2018-07-26 NOTE — ED Notes (Addendum)
Pt states he is never dizzy when he is lying or sitting. Pt states only when he stands or walks he feels off balance and "Veers to the left." Pt does not describe it as room spinning, or lightheadedness. Pt states for the last week his CBG has been higher than usual stating, " in the high 200's-300s." Pt endorses decreased appetite.

## 2018-07-26 NOTE — ED Notes (Signed)
Attempt made to walk pt on the hallway, pt got very dizzy and unsteady on his feet, pt started asking for a walker to be able to walk here, per wife pt never uses a walker at home to walk, pt usually able to walk with slow steady gait at home. Dr. Clayborne DanaMesner notified.

## 2018-07-26 NOTE — ED Notes (Signed)
ED Provider at bedside. 

## 2018-07-26 NOTE — ED Triage Notes (Signed)
C/o dizziness with fall x 2 over the last 5-7 days-states he started having dizziness in Jan with neuro workup and no dx-to triage in w/c-NAD

## 2018-07-27 ENCOUNTER — Encounter (HOSPITAL_COMMUNITY): Payer: Self-pay | Admitting: Internal Medicine

## 2018-07-27 ENCOUNTER — Observation Stay (HOSPITAL_COMMUNITY): Payer: Medicare Other

## 2018-07-27 DIAGNOSIS — N179 Acute kidney failure, unspecified: Secondary | ICD-10-CM | POA: Diagnosis present

## 2018-07-27 DIAGNOSIS — N183 Chronic kidney disease, stage 3 (moderate): Secondary | ICD-10-CM | POA: Diagnosis not present

## 2018-07-27 DIAGNOSIS — I739 Peripheral vascular disease, unspecified: Secondary | ICD-10-CM

## 2018-07-27 DIAGNOSIS — E1029 Type 1 diabetes mellitus with other diabetic kidney complication: Secondary | ICD-10-CM

## 2018-07-27 DIAGNOSIS — N170 Acute kidney failure with tubular necrosis: Secondary | ICD-10-CM | POA: Diagnosis not present

## 2018-07-27 DIAGNOSIS — I10 Essential (primary) hypertension: Secondary | ICD-10-CM

## 2018-07-27 DIAGNOSIS — N189 Chronic kidney disease, unspecified: Secondary | ICD-10-CM

## 2018-07-27 DIAGNOSIS — E1065 Type 1 diabetes mellitus with hyperglycemia: Secondary | ICD-10-CM

## 2018-07-27 DIAGNOSIS — R42 Dizziness and giddiness: Secondary | ICD-10-CM

## 2018-07-27 DIAGNOSIS — R270 Ataxia, unspecified: Secondary | ICD-10-CM | POA: Diagnosis not present

## 2018-07-27 LAB — COMPREHENSIVE METABOLIC PANEL
ALBUMIN: 3.1 g/dL — AB (ref 3.5–5.0)
ALK PHOS: 75 U/L (ref 38–126)
ALT: 20 U/L (ref 0–44)
AST: 21 U/L (ref 15–41)
Anion gap: 7 (ref 5–15)
BILIRUBIN TOTAL: 0.7 mg/dL (ref 0.3–1.2)
BUN: 24 mg/dL — ABNORMAL HIGH (ref 8–23)
CO2: 22 mmol/L (ref 22–32)
Calcium: 8.8 mg/dL — ABNORMAL LOW (ref 8.9–10.3)
Chloride: 110 mmol/L (ref 98–111)
Creatinine, Ser: 1.62 mg/dL — ABNORMAL HIGH (ref 0.61–1.24)
GFR calc Af Amer: 46 mL/min — ABNORMAL LOW (ref >60)
GFR calc non Af Amer: 40 mL/min — ABNORMAL LOW (ref >60)
GLUCOSE: 175 mg/dL — AB (ref 70–99)
POTASSIUM: 3.1 mmol/L — AB (ref 3.5–5.1)
Sodium: 139 mmol/L (ref 135–145)
TOTAL PROTEIN: 6.1 g/dL — AB (ref 6.5–8.1)

## 2018-07-27 LAB — CBC
HEMATOCRIT: 31.1 % — AB (ref 39.0–52.0)
HEMOGLOBIN: 10.5 g/dL — AB (ref 13.0–17.0)
MCH: 29.8 pg (ref 26.0–34.0)
MCHC: 33.8 g/dL (ref 30.0–36.0)
MCV: 88.4 fL (ref 78.0–100.0)
Platelets: 224 10*3/uL (ref 150–400)
RBC: 3.52 MIL/uL — AB (ref 4.22–5.81)
RDW: 13.4 % (ref 11.5–15.5)
WBC: 7.9 10*3/uL (ref 4.0–10.5)

## 2018-07-27 LAB — GLUCOSE, CAPILLARY
GLUCOSE-CAPILLARY: 200 mg/dL — AB (ref 70–99)
GLUCOSE-CAPILLARY: 147 mg/dL — AB (ref 70–99)
GLUCOSE-CAPILLARY: 154 mg/dL — AB (ref 70–99)
GLUCOSE-CAPILLARY: 212 mg/dL — AB (ref 70–99)
Glucose-Capillary: 122 mg/dL — ABNORMAL HIGH (ref 70–99)
Glucose-Capillary: 122 mg/dL — ABNORMAL HIGH (ref 70–99)

## 2018-07-27 LAB — LIPID PANEL
Cholesterol: 163 mg/dL (ref 0–200)
HDL: 30 mg/dL — ABNORMAL LOW (ref >40)
LDL CALC: 108 mg/dL — AB (ref 0–99)
Total CHOL/HDL Ratio: 5.4 RATIO
Triglycerides: 124 mg/dL (ref <150)
VLDL: 25 mg/dL (ref 0–40)

## 2018-07-27 LAB — HEMOGLOBIN A1C
HEMOGLOBIN A1C: 10.2 % — AB (ref 4.8–5.6)
MEAN PLASMA GLUCOSE: 246.04 mg/dL

## 2018-07-27 MED ORDER — PANTOPRAZOLE SODIUM 40 MG PO TBEC
40.0000 mg | DELAYED_RELEASE_TABLET | Freq: Every day | ORAL | Status: DC
  Administered 2018-07-27 – 2018-07-28 (×2): 40 mg via ORAL
  Filled 2018-07-27 (×2): qty 1

## 2018-07-27 MED ORDER — ISOSORBIDE MONONITRATE ER 60 MG PO TB24
60.0000 mg | ORAL_TABLET | Freq: Every day | ORAL | Status: DC
  Administered 2018-07-27 – 2018-07-28 (×2): 60 mg via ORAL
  Filled 2018-07-27 (×2): qty 1

## 2018-07-27 MED ORDER — ACETAMINOPHEN 325 MG PO TABS: 650.0000 mg | ORAL_TABLET | ORAL | Status: DC | PRN

## 2018-07-27 MED ORDER — INSULIN ASPART 100 UNIT/ML ~~LOC~~ SOLN
0.0000 [IU] | Freq: Three times a day (TID) | SUBCUTANEOUS | Status: DC
  Administered 2018-07-27 (×2): 1 [IU] via SUBCUTANEOUS
  Administered 2018-07-27: 2 [IU] via SUBCUTANEOUS
  Administered 2018-07-28: 3 [IU] via SUBCUTANEOUS
  Administered 2018-07-28: 7 [IU] via SUBCUTANEOUS

## 2018-07-27 MED ORDER — STROKE: EARLY STAGES OF RECOVERY BOOK
Freq: Once | Status: AC
  Administered 2018-07-27: 04:00:00

## 2018-07-27 MED ORDER — INSULIN ASPART PROT & ASPART (70-30 MIX) 100 UNIT/ML ~~LOC~~ SUSP: 42.0000 [IU] | Freq: Every day | SUBCUTANEOUS | Status: DC

## 2018-07-27 MED ORDER — INSULIN ASPART PROT & ASPART (70-30 MIX) 100 UNIT/ML ~~LOC~~ SUSP: 26.0000 [IU] | Freq: Every day | SUBCUTANEOUS | Status: DC

## 2018-07-27 MED ORDER — ACETAMINOPHEN 160 MG/5ML PO SOLN: 650.0000 mg | ORAL | Status: DC | PRN

## 2018-07-27 MED ORDER — LABETALOL HCL 5 MG/ML IV SOLN: 5.0000 mg | INTRAVENOUS | Status: DC | PRN

## 2018-07-27 MED ORDER — POLYETHYLENE GLYCOL 3350 17 G PO PACK: 17.0000 g | PACK | Freq: Every day | ORAL | Status: DC | PRN

## 2018-07-27 MED ORDER — INSULIN ASPART PROT & ASPART (70-30 MIX) 100 UNIT/ML ~~LOC~~ SUSP
22.0000 [IU] | Freq: Every day | SUBCUTANEOUS | Status: DC
  Administered 2018-07-27: 22 [IU] via SUBCUTANEOUS
  Filled 2018-07-27: qty 10

## 2018-07-27 MED ORDER — ACETAMINOPHEN 650 MG RE SUPP: 650.0000 mg | RECTAL | Status: DC | PRN

## 2018-07-27 MED ORDER — INSULIN ASPART PROT & ASPART (70-30 MIX) 100 UNIT/ML ~~LOC~~ SUSP
40.0000 [IU] | Freq: Every day | SUBCUTANEOUS | Status: DC
  Administered 2018-07-27 – 2018-07-28 (×2): 40 [IU] via SUBCUTANEOUS
  Filled 2018-07-27: qty 10

## 2018-07-27 MED ORDER — ENOXAPARIN SODIUM 30 MG/0.3ML ~~LOC~~ SOLN
30.0000 mg | SUBCUTANEOUS | Status: DC
  Administered 2018-07-27: 30 mg via SUBCUTANEOUS
  Filled 2018-07-27 (×2): qty 0.3

## 2018-07-27 MED ORDER — ASPIRIN EC 81 MG PO TBEC
81.0000 mg | DELAYED_RELEASE_TABLET | Freq: Every day | ORAL | Status: DC
  Administered 2018-07-27 – 2018-07-28 (×2): 81 mg via ORAL
  Filled 2018-07-27 (×2): qty 1

## 2018-07-27 MED ORDER — SODIUM CHLORIDE 0.9 % IV SOLN
INTRAVENOUS | Status: AC
  Administered 2018-07-27: 02:00:00 via INTRAVENOUS

## 2018-07-27 NOTE — Progress Notes (Signed)
PROGRESS NOTE    Henry LessGlenn T Semidey Jr.  ZOX:[id number] DOB: 12/31/1940 DOA: 07/26/2018 PCP: Romero BellingEllison, Sean, MD    Brief Narrative:  77 yo male who presented with difficulty walking. Patient has the significant past medical history of T2DM, CAD sp CABG, HTN, PVD, and chronic kidney disease. Reported persistent symptoms for the last 7 days, difficulty ambulating and became worse on the day of admission with multiple mechanical falls.  Initial physical examination blood pressure 138/111, heart rate 66, respiratory rate 14, temperature 97.1, oxygen saturation 98%.  Moist mucous membranes, lungs clear to auscultation bilaterally, heart S1-S2 present rhythmic, abdomen soft nontender, no lower extremity edema.  Patient was non-focal, difficulty ambulating due dis-balance.  Sodium 139, potassium 3.1, chloride 110, bicarb 22, glucose 175, BUN 24, creatinine 1.62, calcium 8.8, AST 21, ALT 20, total proteins 6.1, white count 7.9, hemoglobin 10.5, hematocrit 31.1, platelets 224.  Urinalysis with greater than 500 glucose, 30 protein, specific gravity 1.015.  Head CT with no acute changes, mild atrophy and minimal small vessel ischemic changes of the white matter.  Chest x-ray was negative for infiltrates.  EKG with normal sinus rhythm, normal axis, normal intervals.  Patient was admitted to the hospital with a working diagnosis of ambulatory dysfunction due to new onset ataxia.  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 New onset ataxia with ambulatory dysfunction. Will continue neuro checks, physical therapy evaluation. Follow on neurologic work up including brain MRI, carotid US, echocardiogram and follow with neurology recommendations. For now will continue aspirin.   2. Uncontrolled DM type 2. Will  continue glucose cover and monitoring with insulin sliding scale, fasting glucose down to 175. Continue insulin 70/30 44 and 22 units.   3. Pre renal AKI. Renal function has improved with serum cr down to 1,62 from 2,59, will continue to follow on renal panel in am, K at 3,1with serum bicarbonate at 22. Will replete K with 40 meq kcl. Avoid hypotension or nephrotoxic medications. Continue isotonic saline at 50 ml per hour for the next 24 hours.   4. HTN. Will continue blood pressure control with isosorbide, systolic blood pressure 156 to 179 mmHg  DVT prophylaxis: enoxaparin   Code Status:  full Family Communication: I spoke with patient's family at the bedside and all questions were addressed.  Disposition Plan/ discharge barriers: pending neurologic workup.    Consultants:   Neurology   Procedures:     Antimicrobials:       Subjective: Patient is feeling better but not back to baseline, able to ambulate with physical therapy and help of a walker. No chest pain, no nausea or vomiting, no dyspnea.   Objective: Vitals:   07/27/18 0500 07/27/18 0600 07/27/18 0832 07/27/18 1135  BP:   (!) 179/61 (!) 156/67  Pulse:   71 74  Resp: 20 20 20 20   Temp:   98.4 F (36.9 C) 98.6 F (37 C)  TempSrc:   Oral Oral  SpO2:   99% 97%  Weight:      Height:        Intake/Output Summary (Last 24 hours) at 07/27/2018 1316 Last data filed at 07/26/2018 2314 Gross per 24 hour  Intake 2010.74 ml  Output 500 ml  Net 1510.74 ml   Filed Weights   07/26/18  1621 07/26/18 2358  Weight: 74.8 kg (165 lb) 70 kg (154 lb 5.2 oz)    Examination:   General: Not in pain or dyspnea, deconditioned  Neurology: Awake and alert, non focal . Strength preserved proximal and distal lower extremities at rest.  E ENT: no pallor, no icterus, oral mucosa moist Cardiovascular: No JVD. S1-S2 present, rhythmic, no gallops, rubs, or murmurs. No lower extremity edema. Pulmonary: vesicular breath sounds  bilaterally, adequate air movement, no wheezing, rhonchi or rales. Gastrointestinal. Abdomen flat, no organomegaly, non tender, no rebound or guarding Skin. No rashes Musculoskeletal: no joint deformities     Data Reviewed: I have personally reviewed following labs and imaging studies  CBC: Recent Labs  Lab 07/26/18 1700 07/27/18 0459  WBC 5.9 7.9  NEUTROABS 3.0  --   HGB 11.0* 10.5*  HCT 32.1* 31.1*  MCV 88.9 88.4  PLT 226 224   Basic Metabolic Panel: Recent Labs  Lab 07/26/18 1700 07/27/18 0459  NA 134* 139  K 4.3 3.1*  CL 103 110  CO2 22 22  GLUCOSE 511* 175*  BUN 38* 24*  CREATININE 2.59* 1.62*  CALCIUM 8.7* 8.8*   GFR: Estimated Creatinine Clearance: 38.4 mL/min (A) (by C-G formula based on SCr of 1.62 mg/dL (H)). Liver Function Tests: Recent Labs  Lab 07/26/18 1700 07/27/18 0459  AST 23 21  ALT 21 20  ALKPHOS 83 75  BILITOT 0.6 0.7  PROT 7.0 6.1*  ALBUMIN 3.7 3.1*   No results for input(s): LIPASE, AMYLASE in the last 168 hours. No results for input(s): AMMONIA in the last 168 hours. Coagulation Profile: No results for input(s): INR, PROTIME in the last 168 hours. Cardiac Enzymes: Recent Labs  Lab 07/26/18 1700  TROPONINI <0.03   BNP (last 3 results) No results for input(s): PROBNP in the last 8760 hours. HbA1C: Recent Labs    07/27/18 0459  HGBA1C 10.2*   CBG: Recent Labs  Lab 07/26/18 2215 07/27/18 0030 07/27/18 0127 07/27/18 0618 07/27/18 1107  GLUCAP 305* 122* 200* 147* 122*   Lipid Profile: Recent Labs    07/27/18 0459  CHOL 163  HDL 30*  LDLCALC 108*  TRIG 124  CHOLHDL 5.4   Thyroid Function Tests: No results for input(s): TSH, T4TOTAL, FREET4, T3FREE, THYROIDAB in the last 72 hours. Anemia Panel: No results for input(s): VITAMINB12, FOLATE, FERRITIN, TIBC, IRON, RETICCTPCT in the last 72 hours.    Radiology Studies: I have reviewed all of the imaging during this hospital visit personally     Scheduled  Meds: . aspirin EC  81 mg Oral Daily  . enoxaparin (LOVENOX) injection  30 mg Subcutaneous Q24H  . gabapentin  100 mg Oral BID  . insulin aspart  0-9 Units Subcutaneous TID WC  . insulin aspart protamine- aspart  22 Units Subcutaneous Q supper  . insulin aspart protamine- aspart  40 Units Subcutaneous Q breakfast  . isosorbide mononitrate  60 mg Oral Daily  . pantoprazole  40 mg Oral Daily   Continuous Infusions: . sodium chloride 100 mL/hr at 07/27/18 0224     LOS: 0 days        Coralie Keens, MD [facility] Pager [phone]

## 2018-07-27 NOTE — Evaluation (Addendum)
Occupational Therapy Evaluation Patient Details Name: Henry LessGlenn T Economos Jr. MRN: [id number] DOB: [birth year] Today's Date: 07/27/2018    History of Present Illness 77 y.o. male with history of diabetes mellitus type 1, CAD s/p CABG, HTN, peripheral arterial disease, and chronic kidney disease. [facility] with decreased balance during walking. CT showing no acute findings. Awaiting MRI.    Clinical Impression   PTA, pt was living with his wife and was independent. Pt reporting two falls within the past week where his legs "gave out". Pt currently requiring Min A for LB ADLs and functional mobility. Pt presenting with decreased balance during dynamic standing with a posterior lean. Pt with single LOB during dynamic balance tasks and presenting with increased fear of falling. Pt would benefit from further acute OT to facilitate safe dc. Recommend dc to home with initial 24/7 support and follow up with HHOT for further OT to optimize safety, independence with ADLs, and return to PLOF.      Follow Up Recommendations  HHOT;Supervision/Assistance - 24 hour   Equipment Recommendations  3 in 1 bedside commode    Recommendations for Other Services PT consult     Precautions / Restrictions Precautions Precautions: Fall Restrictions Weight Bearing Restrictions: No      Mobility Bed Mobility Overal bed mobility: Needs Assistance Bed Mobility: Supine to Sit     Supine to sit: HOB elevated;Supervision     General bed mobility comments: supervision for safety. HOB elevated  Transfers Overall transfer level: Needs assistance Equipment used: None Transfers: Sit to/from Stand Sit to Stand: Min assist;Min guard         General transfer comment: MIn GUard A for safety. Min A to steady in standing    Balance Overall balance assessment: Needs assistance Sitting-balance support: No upper extremity supported;Feet supported Sitting balance-Leahy Scale: Good      Standing balance support: No upper extremity supported;During functional activity Standing balance-Leahy Scale: Fair Standing balance comment: Able to maintain static standing without UE support                           ADL either performed or assessed with clinical judgement   ADL Overall ADL's : Needs assistance/impaired Eating/Feeding: Independent   Grooming: Oral care;Wash/dry face;Wash/dry hands;Set up;Supervision/safety;Standing Grooming Details (indicate cue type and reason): Pt performing grooming at sink with supervision for safety. Upper Body Bathing: Supervision/ safety;Sitting   Lower Body Bathing: Minimal assistance;Sit to/from stand Lower Body Bathing Details (indicate cue type and reason): Min A for dyanmic standing balance Upper Body Dressing : Supervision/safety;Sitting Upper Body Dressing Details (indicate cue type and reason): donning second gown like jacket Lower Body Dressing: Sit to/from stand;Minimal assistance Lower Body Dressing Details (indicate cue type and reason): Min A for standing balance with dynamic balance. Pt donning shoes while seated demonstrating WFL ROM Toilet Transfer: Min guard;Ambulation(Simulated to recliner) StatisticianToilet Transfer Details (indicate cue type and reason): Min Guard A for safety. Cues for hand placement         Functional mobility during ADLs: Minimal assistance General ADL Comments: Pt presenting with decreased balance as seen duirng funcitonal mobility and dynamic standing tasks. Pt with LOB (posterior leaning) during a task qhich requested turning. Pt also presenting fear of falling.      Vision Baseline Vision/History: Wears glasses Wears Glasses: At all times Patient Visual Report: No change from baseline       Perception     Praxis  Pertinent Vitals/Pain Pain Assessment: No/denies pain     Hand Dominance Right   Extremity/Trunk Assessment Upper Extremity Assessment Upper Extremity Assessment:  Overall WFL for tasks assessed   Lower Extremity Assessment Lower Extremity Assessment: Generalized weakness   Cervical / Trunk Assessment Cervical / Trunk Assessment: Normal;Other exceptions Cervical / Trunk Exceptions: Tendency for forward flexion   Communication Communication Communication: No difficulties   Cognition Arousal/Alertness: Awake/alert Behavior During Therapy: WFL for tasks assessed/performed Overall Cognitive Status: Within Functional Limits for tasks assessed                                     General Comments  Wife present during session    Exercises     Shoulder Instructions      Home Living Family/patient expects to be discharged to:: Private residence Living Arrangements: Spouse/significant other Available Help at Discharge: Family;Available 24 hours/day Type of Home: House Home Access: Stairs to enter;Ramped entrance     Home Layout: One level     Bathroom Shower/Tub: Tub/shower unit;Curtain   Firefighter: Standard     Home Equipment: Environmental consultant - standard(Old walker)   Additional Comments: Pt reports he has fall twice in the last week      Prior Functioning/Environment Level of Independence: Independent        Comments: ADLs, IADLs, driving, and enjoys volunerring with neighbor watch and church        OT Problem List: Impaired balance (sitting and/or standing);Decreased knowledge of use of DME or AE;Decreased knowledge of precautions;Pain      OT Treatment/Interventions: Self-care/ADL training;Therapeutic exercise;Energy conservation;DME and/or AE instruction;Therapeutic activities;Patient/family education;Balance training    OT Goals(Current goals can be found in the care plan section) Acute Rehab OT Goals Patient Stated Goal: "Find out why I am falling. It is scary." OT Goal Formulation: With patient Time For Goal Achievement: 08/10/18 Potential to Achieve Goals: Good  OT Frequency: Min 3X/week   Barriers to  D/C:            Co-evaluation              AM-PAC PT "6 Clicks" Daily Activity     Outcome Measure Help from another person eating meals?: None Help from another person taking care of personal grooming?: None Help from another person toileting, which includes using toliet, bedpan, or urinal?: A Little Help from another person bathing (including washing, rinsing, drying)?: A Little Help from another person to put on and taking off regular upper body clothing?: None Help from another person to put on and taking off regular lower body clothing?: A Little 6 Click Score: 21   End of Session Equipment Utilized During Treatment: Gait belt Nurse Communication: Mobility status  Activity Tolerance: Patient tolerated treatment well Patient left: in chair;with call bell/phone within reach  OT Visit Diagnosis: Unsteadiness on feet (R26.81);Other abnormalities of gait and mobility (R26.89);Muscle weakness (generalized) (M62.81);History of falling (Z91.81)                Time: 1014-1040 OT Time Calculation (min): 26 min Charges:  OT General Charges $OT Visit: 1 Visit OT Evaluation $OT Eval Low Complexity: 1 Low OT Treatments $Self Care/Home Management : 8-22 mins  Marquis Diles MSOT, OTR/L Acute Rehab Pager: [phone] Office: [phone]  Theodoro Grist Ricardo Schubach 07/27/2018, 11:00 AM

## 2018-07-27 NOTE — H&P (Signed)
History and Physical    Henry LessGlenn T Kinchen Jr. ZOX:[id number] DOB: 1941-11-16 DOA: 07/26/2018  PCP: Romero BellingEllison, Sean, MD  Patient coming from: Home.  [facility]  Chief Complaint: Difficulty walking.  HPI: Henry LessGlenn T Franzoni Jr. is a 77 y.o. male with history of diabetes mellitus type 1 CAD status post CABG, hypertension, peripheral arterial disease, chronic kidney disease presents to the ER [facility] with difficulty having to walk with imbalance.  Has been having the symptoms for last 1 week.  Patient also noticed over the last 1 week has been having elevated blood sugars.  Denies nausea vomiting diarrhea has been a poor appetite.  Denies any weakness of the upper extremities or any difficulty swallowing speaking or any visual symptoms.  And patient tries to walk his legs give way and has imbalance.  ED Course: In the ER on exam patient was found to be ataxic.  CT head is unremarkable.  Labs show acute renal failure.  Blood sugar was in the 500 with not in DKA.  Was given subcutaneous NovoLog insulin for elevated blood sugar and home dose of insulin.  Was given 1 L fluid bolus for acute renal failure.  ER physician discussed with on-call neurologist Dr. Jerrell BelfastAurora who advised to get MRI brain for further specimen for the plans.  Review of Systems: As per HPI, rest all negative.   Past Medical History:  Diagnosis Date  . ANEMIA-NOS 07/10/2007  . CORONARY ARTERY DISEASE 07/10/2007  . DIABETES MELLITUS, TYPE I 07/10/2007  . HYPERLIPIDEMIA 07/10/2007  . HYPERTENSION 07/10/2007  . Peripheral arterial disease (HCC)   . 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 RENAL INSUFFICIENCY 07/10/2007    Past Surgical History:  Procedure Laterality Date  . APPENDECTOMY    . CARDIAC CATHETERIZATION     stenting in the vein graft to the right coronary artery and had_DES 3x 26 mm Integrity stent inserted, post dilated to 3.4 mm  . CAROTID ENDARTERECTOMY Right     right  . CHOLECYSTECTOMY N/A 09/13/2017   Procedure: LAPAROSCOPIC CHOLECYSTECTOMY;  Surgeon: Kinsinger, De BlanchLuke Aaron, MD;  Location: [facility];  Service: General;  Laterality: N/A;  . CORONARY ARTERY BYPASS GRAFT  02/2010   PTCA of his right coroary artery.   . ESOPHAGOGASTRODUODENOSCOPY N/A 09/11/2017   Procedure: ESOPHAGOGASTRODUODENOSCOPY (EGD);  Surgeon: Hilarie FredricksonPerry, John N, MD;  [facility];  Service: Endoscopy;  Laterality: N/A;     reports that he has quit smoking. He has never used smokeless tobacco. He reports that he does not drink alcohol or use drugs.  Allergies  Allergen Reactions  . Hydralazine Swelling    Feet swelled so bad they were cracking Severe foot swelling  . Penicillins Other (See Comments)    Per allergy test.  . Amlodipine Swelling  . Amlodipine Besylate Swelling  . Codeine Other (See Comments)    anxiety  . Contrast Media [Iodinated Diagnostic Agents]     Kidney issues when given  . Lipitor [Atorvastatin Calcium] Other (See Comments)    myalgia  . Valsartan Other (See Comments) and Swelling    Caused kidney damage  . Zetia [Ezetimibe] Other (See Comments)    Severe dizziness  . Penicillin G Rash    Family History  Problem Relation Age of Onset  . Heart disease Mother   . Cancer Neg Hx     Prior to Admission medications   Medication Sig Start Date End Date Taking? Authorizing Provider  acetaminophen (  TYLENOL) 500 MG tablet Take 500 mg by mouth daily as needed (pain).    [provider]  aspirin EC 81 MG tablet Take 81 mg by mouth daily.    [provider]  B Complex-C-Folic Acid (FOLBEE PLUS) TABS Take 1 tablet by mouth daily.      [provider]  Cholecalciferol (VITAMIN D) 2000 UNITS CAPS Take 2,000 Units by mouth daily.     [provider]  furosemide (LASIX) 20 MG tablet Take 20 mg by mouth.    [provider]  gabapentin (NEURONTIN) 100 MG capsule Take 100 mg by mouth 2 (two) times daily.      [provider]  hydrochlorothiazide (MICROZIDE) 12.5 MG capsule Take 12.5 mg by mouth daily.    [provider]  insulin NPH-regular Human (NOVOLIN 70/30) (70-30) 100 UNIT/ML injection 43 units with breakfast and 26 units with supper 03/06/18   Romero Belling, MD  isosorbide mononitrate (IMDUR) 60 MG 24 hr tablet TAKE 1 TABLET BY MOUTH DAILY 05/28/18   Lennette Bihari, MD  labetalol (NORMODYNE) 200 MG tablet Take 1 tablet (200 mg total) by mouth 2 (two) times daily. NEED OV. 12/10/17   Lennette Bihari, MD  omeprazole (PRILOSEC OTC) 20 MG tablet Take 20 mg by mouth daily.    [provider]  polyethylene glycol (MIRALAX / GLYCOLAX) packet Take 17 g by mouth daily as needed. Patient not taking: Reported on 06/17/2018 09/15/17   Zannie Cove, MD    Physical Exam: Vitals:   07/26/18 2207 07/26/18 2230 07/26/18 2300 07/26/18 2358  BP: (!) 138/111 (!) 211/61 (!) 198/65 (!) 224/61  Pulse: 66 63 66 70  Resp: 14   18  Temp: (!) 97.1 F (36.2 C)   99.1 F (37.3 C)  TempSrc:    Oral  SpO2: 98% 99% 98% 99%  Weight:    70 kg (154 lb 5.2 oz)  Height:    5\' 10"  (1.778 m)      Constitutional: Moderately built and nourished. Vitals:   07/26/18 2207 07/26/18 2230 07/26/18 2300 07/26/18 2358  BP: (!) 138/111 (!) 211/61 (!) 198/65 (!) 224/61  Pulse: 66 63 66 70  Resp: 14   18  Temp: (!) 97.1 F (36.2 C)   99.1 F (37.3 C)  TempSrc:    Oral  SpO2: 98% 99% 98% 99%  Weight:    70 kg (154 lb 5.2 oz)  Height:    5\' 10"  (1.778 m)   Eyes: Anicteric no pallor. ENMT: No discharge from the ears eyes nose or mouth. Neck: No mass felt.  No neck rigidity. Respiratory: No rhonchi or crepitations. Cardiovascular: S1-S2 heard no murmurs appreciated. Abdomen: Soft nontender bowel sounds present. Musculoskeletal: No edema.  No joint effusion. Skin: No rash.  Skin appears warm. Neurologic: Alert awake oriented to time place and person.  Moves all extremities 5 x 5.  Trying to  ambulate patient finds it difficult because of imbalance.  No facial asymmetry tongue is midline pupils equal and reacting to light. Psychiatric: Appears normal.   Labs on Admission: I have personally reviewed following labs and imaging studies  CBC: Recent Labs  Lab 07/26/18 1700  WBC 5.9  NEUTROABS 3.0  HGB 11.0*  HCT 32.1*  MCV 88.9  PLT 226   Basic Metabolic Panel: Recent Labs  Lab 07/26/18 1700  NA 134*  K 4.3  CL 103  CO2 22  GLUCOSE 511*  BUN 38*  CREATININE 2.59*  CALCIUM  8.7*   GFR: Estimated Creatinine Clearance: 24 mL/min (A) (by C-G formula based on SCr of 2.59 mg/dL (H)). Liver Function Tests: Recent Labs  Lab 07/26/18 1700  AST 23  ALT 21  ALKPHOS 83  BILITOT 0.6  PROT 7.0  ALBUMIN 3.7   No results for input(s): LIPASE, AMYLASE in the last 168 hours. No results for input(s): AMMONIA in the last 168 hours. Coagulation Profile: No results for input(s): INR, PROTIME in the last 168 hours. Cardiac Enzymes: Recent Labs  Lab 07/26/18 1700  TROPONINI <0.03   BNP (last 3 results) No results for input(s): PROBNP in the last 8760 hours. HbA1C: No results for input(s): HGBA1C in the last 72 hours. CBG: Recent Labs  Lab 07/26/18 1953 07/26/18 2215  GLUCAP 467* 305*   Lipid Profile: No results for input(s): CHOL, HDL, LDLCALC, TRIG, CHOLHDL, LDLDIRECT in the last 72 hours. Thyroid Function Tests: No results for input(s): TSH, T4TOTAL, FREET4, T3FREE, THYROIDAB in the last 72 hours. Anemia Panel: No results for input(s): VITAMINB12, FOLATE, FERRITIN, TIBC, IRON, RETICCTPCT in the last 72 hours. Urine analysis:    Component Value Date/Time   COLORURINE YELLOW 07/26/2018 1700   APPEARANCEUR CLEAR 07/26/2018 1700   LABSPEC 1.015 07/26/2018 1700   PHURINE 5.5 07/26/2018 1700   GLUCOSEU >=500 (A) 07/26/2018 1700   GLUCOSEU 100 (A) 09/23/2015 0958   HGBUR NEGATIVE 07/26/2018 1700   BILIRUBINUR NEGATIVE 07/26/2018 1700   KETONESUR 15 (A)  07/26/2018 1700   PROTEINUR 30 (A) 07/26/2018 1700   UROBILINOGEN 0.2 09/23/2015 0958   NITRITE NEGATIVE 07/26/2018 1700   LEUKOCYTESUR NEGATIVE 07/26/2018 1700   Sepsis Labs: @LABRCNTIP (procalcitonin:4,lacticidven:4) )No results found for this or any previous visit (from the past 240 hour(s)).   Radiological Exams on Admission: Dg Chest 2 View  Result Date: 07/26/2018 CLINICAL DATA:  Dizziness for 1 week EXAM: CHEST - 2 VIEW COMPARISON:  09/12/2017 FINDINGS: Post sternotomy changes. No acute opacity or pleural effusion. Stable cardiomediastinal silhouette with aortic atherosclerosis. No pneumothorax. Mild scoliosis of the spine. IMPRESSION: No active cardiopulmonary disease. Electronically Signed   By: Jasmine Pang M.D.   On: 07/26/2018 17:48   Ct Head Wo Contrast  Result Date: 07/26/2018 CLINICAL DATA:  Dizziness with unsteady gait EXAM: CT HEAD WITHOUT CONTRAST TECHNIQUE: Contiguous axial images were obtained from the base of the skull through the vertex without intravenous contrast. COMPARISON:  10/25/2017 MRI, CT brain 05/17/2017 FINDINGS: Brain: No evidence of acute infarction, hemorrhage, hydrocephalus, extra-axial collection or mass lesion/mass effect. Vascular: No hyperdense vessels. Vertebral artery and carotid vascular calcification Skull: Normal. Negative for fracture or focal lesion. Sinuses/Orbits: Mucosal thickening and retention cyst in the right maxillary sinus. Mild mucosal thickening in the ethmoid sinuses. Other: None IMPRESSION: 1. No CT evidence for acute intracranial abnormality. 2. Mild atrophy and minimal small vessel ischemic changes of the white matter Electronically Signed   By: Jasmine Pang M.D.   On: 07/26/2018 17:41    EKG: Independently reviewed.  Normal sinus rhythm.  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 Ataxia -discussed with neurologist Dr. Laurence Slate who advised to get MRI brain and if patient rules in for stroke further stroke work-up.  We will get physical therapy consult continue antiplatelet agents. 2. Uncontrolled diabetes mellitus type 1 not in DKA -received  8 units of NovoLog in the ER and has also received home dose of NovoLog 70/30 evening dose.  Patient was having sugar was around 120 when he came to our facility.  Was to give some sugar due to patient was mildly symptomatic but patient states he becomes symptomatic when his blood sugars drop less than 5 and 50.  His hemoglobin A1c loss as per the patient was around 10.2.  Closely follow CBGs.  I have decreased patient's insulin dose by 2 to 3 units because of renal failure. 3. Acute renal failure on chronic kidney disease stage III -not sure of the exact cause patient states last 2 to 3 days he has not been eating well.  Will hydrate and recheck metabolic panel.  Closely follow intake output. 4. Hypertension uncontrolled -we will allow for permissive hypertension until stroke ruled out.  Patient is on labetalol and immediate.  Hold hydrochlorothiazide due to patient receiving fluids and had renal failure.   DVT prophylaxis: Lovenox. Code Status: Full code. Family Communication: Discussed with patient. Disposition Plan: Home. Consults called: Discussed with neurologist. Admission status: Observation.   Eduard Clos MD [facility] Pager [phone].  If 7PM-7AM, please contact night-coverage [email or web address] Password [email or web address]  07/27/2018, 2:07 AM

## 2018-07-27 NOTE — Evaluation (Signed)
Physical Therapy Evaluation Patient Details Name: Henry LessGlenn T Lavey Jr. MRN: [id number] DOB: 1941/11/08 Today's Date: 07/27/2018   History of Present Illness  77 y.o. male with history of diabetes mellitus type 1, CAD s/p CABG, HTN, peripheral arterial disease, and chronic kidney disease. [facility] with decreased balance during walking. CT showing no acute findings. Awaiting MRI.   Clinical Impression  Pt was able to be seen for evaluation of mobility, and noted his improvement of gait with RW.  Steadier and more confident, and wife was able to see his performance of gait.  He is recommended to continue acute therapy, progress to HHPT when ready.  He is going to focus on strength, balance and control of gait with gradually lesser AD's.    Follow Up Recommendations Home health PT;Supervision for mobility/OOB    Equipment Recommendations  Rolling walker with 5" wheels    Recommendations for Other Services       Precautions / Restrictions Precautions Precautions: Fall Restrictions Weight Bearing Restrictions: No      Mobility  Bed Mobility               General bed mobility comments: up in chair when PT arrived  Transfers Overall transfer level: Needs assistance Equipment used: Rolling walker (2 wheeled) Transfers: Sit to/from Stand Sit to Stand: Min guard(cued to use hands on chair arms)            Ambulation/Gait Ambulation/Gait assistance: Min guard Gait Distance (Feet): 180 Feet Assistive device: Rolling walker (2 wheeled) Gait Pattern/deviations: Step-through pattern;Decreased stride length;Trunk flexed;Wide base of support Gait velocity: reduced Gait velocity interpretation: <1.31 ft/sec, indicative of household ambulator General Gait Details: safe turns on RW with pt using reasonable pace to walk  Stairs            Wheelchair Mobility    Modified Rankin (Stroke Patients Only)       Balance Overall balance assessment:  Needs assistance Sitting-balance support: Feet supported Sitting balance-Leahy Scale: Good     Standing balance support: Bilateral upper extremity supported;During functional activity Standing balance-Leahy Scale: Fair                               Pertinent Vitals/Pain Pain Assessment: No/denies pain    Home Living Family/patient expects to be discharged to:: Private residence Living Arrangements: Spouse/significant other Available Help at Discharge: Family;Available 24 hours/day Type of Home: House Home Access: Stairs to enter;Ramped entrance     Home Layout: One level Home Equipment: Walker - standard Additional Comments: Pt reports he has fall twice in the last week    Prior Function Level of Independence: Independent         Comments: pt is independent to care for himself and drives, home independently     Hand Dominance   Dominant Hand: Right    Extremity/Trunk Assessment   Upper Extremity Assessment Upper Extremity Assessment: Overall WFL for tasks assessed    Lower Extremity Assessment Lower Extremity Assessment: RLE deficits/detail;LLE deficits/detail RLE Deficits / Details: DF 4- RLE Coordination: decreased gross motor LLE Deficits / Details: DF and L hamstrings 4- LLE Coordination: decreased gross motor    Cervical / Trunk Assessment Cervical / Trunk Assessment: Normal  Communication   Communication: No difficulties  Cognition Arousal/Alertness: Awake/alert Behavior During Therapy: WFL for tasks assessed/performed Overall Cognitive Status: Within Functional Limits for tasks assessed  General Comments General comments (skin integrity, edema, etc.): wife observed PT eval    Exercises     Assessment/Plan    PT Assessment Patient needs continued PT services  PT Problem List Decreased strength;Decreased range of motion;Decreased activity tolerance;Decreased balance;Decreased  mobility;Decreased coordination;Decreased knowledge of use of DME;Decreased safety awareness       PT Treatment Interventions DME instruction;Gait training;Functional mobility training;Therapeutic activities;Therapeutic exercise;Balance training;Neuromuscular re-education;Patient/family education    PT Goals (Current goals can be found in the Care Plan section)  Acute Rehab PT Goals Patient Stated Goal: to stop falling PT Goal Formulation: With patient/family Time For Goal Achievement: 08/10/18 Potential to Achieve Goals: Good    Frequency Min 3X/week   Barriers to discharge Other (comment) has tendency to fall at home alone    Co-evaluation               AM-PAC PT "6 Clicks" Daily Activity  Outcome Measure Difficulty turning over in bed (including adjusting bedclothes, sheets and blankets)?: A Little Difficulty moving from lying on back to sitting on the side of the bed? : Unable Difficulty sitting down on and standing up from a chair with arms (e.g., wheelchair, bedside commode, etc,.)?: Unable Help needed moving to and from a bed to chair (including a wheelchair)?: A Little Help needed walking in hospital room?: A Little Help needed climbing 3-5 steps with a railing? : A Lot 6 Click Score: 13    End of Session Equipment Utilized During Treatment: Gait belt Activity Tolerance: Patient tolerated treatment well;Patient limited by fatigue Patient left: in chair;with call bell/phone within reach;with family/visitor present Nurse Communication: Mobility status PT Visit Diagnosis: Unsteadiness on feet (R26.81);Muscle weakness (generalized) (M62.81);Difficulty in walking, not elsewhere classified (R26.2)    Time: 1610-9604 PT Time Calculation (min) (ACUTE ONLY): 29 min   Charges:   PT Evaluation $PT Eval Moderate Complexity: 1 Mod PT Treatments $Gait Training: 8-22 mins       Ivar Drape 07/27/2018, 3:14 PM   Samul Dada, PT MS Acute Rehab Dept. Number: [facility]  [phone] and [facility] [phone]

## 2018-07-27 NOTE — Progress Notes (Signed)
Paged [facility], informing patient has arrived.

## 2018-07-28 DIAGNOSIS — I739 Peripheral vascular disease, unspecified: Secondary | ICD-10-CM | POA: Diagnosis not present

## 2018-07-28 DIAGNOSIS — I10 Essential (primary) hypertension: Secondary | ICD-10-CM | POA: Diagnosis not present

## 2018-07-28 DIAGNOSIS — R42 Dizziness and giddiness: Secondary | ICD-10-CM | POA: Diagnosis not present

## 2018-07-28 DIAGNOSIS — R270 Ataxia, unspecified: Secondary | ICD-10-CM | POA: Diagnosis not present

## 2018-07-28 DIAGNOSIS — N170 Acute kidney failure with tubular necrosis: Secondary | ICD-10-CM | POA: Diagnosis not present

## 2018-07-28 DIAGNOSIS — N183 Chronic kidney disease, stage 3 (moderate): Secondary | ICD-10-CM | POA: Diagnosis not present

## 2018-07-28 LAB — GLUCOSE, CAPILLARY
Glucose-Capillary: 238 mg/dL — ABNORMAL HIGH (ref 70–99)
Glucose-Capillary: 334 mg/dL — ABNORMAL HIGH (ref 70–99)

## 2018-07-28 LAB — BASIC METABOLIC PANEL
Anion gap: 9 (ref 5–15)
BUN: 22 mg/dL (ref 8–23)
CO2: 20 mmol/L — ABNORMAL LOW (ref 22–32)
Calcium: 8.7 mg/dL — ABNORMAL LOW (ref 8.9–10.3)
Chloride: 106 mmol/L (ref 98–111)
Creatinine, Ser: 1.5 mg/dL — ABNORMAL HIGH (ref 0.61–1.24)
GFR calc Af Amer: 50 mL/min — ABNORMAL LOW (ref >60)
GFR, EST NON AFRICAN AMERICAN: 43 mL/min — AB (ref >60)
Glucose, Bld: 241 mg/dL — ABNORMAL HIGH (ref 70–99)
POTASSIUM: 3.8 mmol/L (ref 3.5–5.1)
Sodium: 135 mmol/L (ref 135–145)

## 2018-07-28 MED ORDER — ENOXAPARIN SODIUM 40 MG/0.4ML ~~LOC~~ SOLN
40.0000 mg | SUBCUTANEOUS | Status: DC
  Administered 2018-07-28: 40 mg via SUBCUTANEOUS
  Filled 2018-07-28: qty 0.4

## 2018-07-28 NOTE — Care Management Note (Signed)
Case Management Note  Patient Details  Name: Henry LessGlenn T Centner Jr. MRN: [id number] Date of Birth: 11/28/1941  Subjective/Objective:    Pt presented for ataxia.  Pt from home with wife.[facility] and states he has a walker to use at home. Pt requests 3n1.                [facility]: [facility]  Jermaine with [facility] conatcted and accepted referral.  [facility] to deliver 3n1 to room prior to d/c.   Expected Discharge Date:  07/28/18               Expected Discharge Plan:  Home w Home Health Services  In-House Referral:  NA  Discharge planning Services  CM Consult  Post Acute Care Choice:  Home Health, Durable Medical Equipment Choice offered to:  Patient, Spouse  DME Arranged:  3-N-1 DME Agency:  [facility]  HH Arranged:  [facility], [facility]  [facility]  Status of Service:  Completed, signed off  If discussed at Long Length of Stay Meetings, dates discussed:    Additional Comments:  Deveron Furlongshley  Cristo Ausburn, RN 07/28/2018, 12:37 PM

## 2018-07-28 NOTE — Progress Notes (Signed)
NURSING PROGRESS NOTE  Henry [name] Buysse Jr. [id number] Discharge Data: 07/28/2018 2:04 PM Attending Provider: Coralie KeensArrien, Mauricio Daniel,* NWG:NFAOZHYPCP:Ellison, Gregary SignsSean, MD     Henry [name] Koziol Jr. to be D/C'd Home per MD order.  Discussed with the patient the After Visit Summary and all questions fully answered. All IV's discontinued with no bleeding noted. All belongings returned to patient for patient to take home.   Last Vital Signs:  Blood pressure (!) (P) 154/51, pulse (P) 72, temperature (P) 98.6 F (37 C), temperature source (P) Oral, resp. rate (P) 18, height 5\' 10"  (1.778 m), weight 70 kg (154 lb 5.2 oz), SpO2 (P) 100 %.  Discharge Medication List Allergies as of 07/28/2018      Reactions   Hydralazine Swelling   Feet swelled so bad they were cracking Severe foot swelling   Penicillins Other (See Comments)   Per allergy test. (pt states he was never given penicillin)   Amlodipine Besylate Swelling   Codeine Other (See Comments)   anxiety   Contrast Media [iodinated Diagnostic Agents] Other (See Comments)   Kidney issues when given   Lipitor [atorvastatin Calcium] Other (See Comments)   myalgia   Valsartan Other (See Comments), Swelling   Caused kidney damage   Zetia [ezetimibe] Other (See Comments)   Severe dizziness      Medication List    STOP taking these medications   polyethylene glycol packet Commonly known as:  MIRALAX / GLYCOLAX     TAKE these medications   acetaminophen 500 MG tablet Commonly known as:  TYLENOL Take 500 mg by mouth daily as needed (pain).   aspirin EC 81 MG tablet Take 81 mg by mouth daily.   FOLBEE PLUS Tabs Take 1 tablet by mouth daily.   furosemide 20 MG tablet Commonly known as:  LASIX Take 20 mg by mouth daily.   gabapentin 100 MG capsule Commonly known as:  NEURONTIN Take 100 mg by mouth at bedtime.   insulin NPH-regular Human (70-30) 100 UNIT/ML injection Commonly known as:  NOVOLIN 70/30 43 units with breakfast and 26 units with  supper What changed:    how much to take  how to take this  when to take this  additional instructions   isosorbide mononitrate 60 MG 24 hr tablet Commonly known as:  IMDUR TAKE 1 TABLET BY MOUTH DAILY   labetalol 200 MG tablet Commonly known as:  NORMODYNE Take 1 tablet (200 mg total) by mouth 2 (two) times daily. NEED OV.   omeprazole 20 MG tablet Commonly known as:  PRILOSEC OTC Take 20 mg by mouth daily.   Vitamin D 2000 units Caps Take 2,000 Units by mouth daily.            Durable Medical Equipment  (From admission, onward)        Start     Ordered   07/28/18 1235  For home use only DME 3 n 1  Once     07/28/18 1234

## 2018-07-28 NOTE — Discharge Summary (Signed)
Physician Discharge Summary  Henry [name]. ZOX:[id number] DOB: [birth year] DOA: 07/26/2018  PCP: Romero Belling, MD  Admit date: 07/26/2018 Discharge date: 07/28/2018  Admitted From: Home  Disposition:  Home   Recommendations for Outpatient Follow-up and new medication changes:  1. Follow up Dr. Everardo All in 7 days 2. Follow up with neurology as scheduled   Home Health: yes   Equipment/Devices: walker    Discharge Condition: stable  CODE STATUS: full  Diet recommendation: heart healthy and diabetic prudent   Brief/Interim Summary: 77 yo male who presented with difficulty walking. Patient has the significant past medical history of T2DM, CAD sp CABG, HTN, PVD, and chronic kidney disease. Reported persistent symptoms for the last 7 days, difficulty ambulating that became worse on the day of admission with multiple mechanical falls.  Initial physical examination blood pressure 138/111, heart rate 66, respiratory rate 14, temperature 97.1, oxygen saturation 98%.  Moist mucous membranes, lungs clear to auscultation bilaterally, heart S1-S2 present rhythmic, abdomen soft nontender, no lower extremity edema.  Patient was non-focal, difficulty ambulating due to dis-balance.  Sodium 139, potassium 3.1, chloride 110, bicarb 22, glucose 175, BUN 24, creatinine 1.62, calcium 8.8, AST 21, ALT 20, total proteins 6.1, white count 7.9, hemoglobin 10.5, hematocrit 31.1, platelets 224.  Urinalysis with greater than 500 glucose, 30 protein, specific gravity 1.015.  Head CT with no acute changes, mild atrophy and minimal small vessel ischemic changes of the white matter.  Chest x-ray was negative for infiltrates.  EKG with normal sinus rhythm, normal axis, normal intervals.  Patient was admitted to the hospital with a working diagnosis of ambulatory dysfunction due to new onset ataxia.  1. Acute on chronic ambulatory dysfunction, CVA was ruled out. Patient was admitted to the medical ward, he was placed on a  remote telemetry monitor, frequent neuro checks, further workup with the brain MRI  was negative for acute CVA. Recent carotid ultrasonography and echocardiography were negative in terms of cause of ischemic CVA. Patient was seen by physical therapy with recommendations for home PT/OT, he was able to ambulate with a walker 180 feet, with decreased stride length, trunk flex, wide-based support. We will arrange for outpatient neurology follow-up. Continue gabapentin 100 mg daily, cholecalciferol, vitamin B C complex.  2. Prerenal acute kidney injury on chronic kidney disease stage 3 (base GFR 45) Patient received isotonic IV fluids with improvement of kidney function,his discharge creatinine is 1.5, serum bicarbonate 20, serum potassium 3.8. His basic creatinine is 1.5-1.6. Patient will resume furosemide at discharge.  3. Uncontrolled type 2 diabetes mellitus. Patient was placed on insulin sliding scale for glucose coverage and monitoring, capillary glucose 147, 122, 154, 212, 238. At home he will resume his insulin regimen, with insulin 70/30, 43 units in a.m. 26 units p.m.  4. Hypertension.Will continue blood pressure control with labetalol and isosorbide.   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 (pt states he was never given penicillin)   Amlodipine Besylate Swelling   Codeine Other (See Comments)   anxiety   Contrast Media [iodinated  Diagnostic Agents] Other (See Comments)   Kidney issues when given   Lipitor [atorvastatin Calcium] Other (See Comments)   myalgia    Valsartan Other (See Comments), Swelling   Caused kidney damage   Zetia [ezetimibe] Other (See Comments)   Severe dizziness      Medication List    STOP taking these medications   polyethylene glycol packet Commonly known as:  MIRALAX / GLYCOLAX     TAKE these medications   acetaminophen 500 MG tablet Commonly known as:  TYLENOL Take 500 mg by mouth daily as needed (pain).   aspirin EC 81 MG tablet Take 81 mg by mouth daily.   FOLBEE PLUS Tabs Take 1 tablet by mouth daily.   furosemide 20 MG tablet Commonly known as:  LASIX Take 20 mg by mouth daily.   gabapentin 100 MG capsule Commonly known as:  NEURONTIN Take 100 mg by mouth at bedtime.   insulin NPH-regular Human (70-30) 100 UNIT/ML injection Commonly known as:  NOVOLIN 70/30 43 units with breakfast and 26 units with supper What changed:    how much to take  how to take this  when to take this  additional instructions   isosorbide mononitrate 60 MG 24 hr tablet Commonly known as:  IMDUR TAKE 1 TABLET BY MOUTH DAILY   labetalol 200 MG tablet Commonly known as:  NORMODYNE Take 1 tablet (200 mg total) by mouth 2 (two) times daily. NEED OV.   omeprazole 20 MG tablet Commonly known as:  PRILOSEC OTC Take 20 mg by mouth daily.   Vitamin D 2000 units Caps Take 2,000 Units by mouth daily.       Allergies  Allergen Reactions  . Hydralazine Swelling    Feet swelled so bad they were cracking Severe foot swelling  . Penicillins Other (See Comments)    Per allergy test. (pt states he was never given penicillin)  . Amlodipine Besylate Swelling  . Codeine Other (See Comments)    anxiety  . Contrast Media [Iodinated Diagnostic Agents] Other (See Comments)    Kidney issues when given  . Lipitor [Atorvastatin Calcium] Other (See Comments)    myalgia  . Valsartan Other (See Comments) and Swelling    Caused kidney damage  . Zetia [Ezetimibe] Other (See Comments)    Severe dizziness     Consultations:  Neurology over the phone, Dr. Laurence Slate recommended brain MRI.   Procedures/Studies: Dg Chest 2 View  Result Date: 07/26/2018 CLINICAL DATA:  Dizziness for 1 week EXAM: CHEST - 2 VIEW COMPARISON:  09/12/2017 FINDINGS: Post sternotomy changes. No acute opacity or pleural effusion. Stable cardiomediastinal silhouette with aortic atherosclerosis. No pneumothorax. Mild scoliosis of the spine. IMPRESSION: No active cardiopulmonary disease. Electronically Signed   By: Jasmine Pang M.D.   On: 07/26/2018 17:48   Ct Head Wo Contrast  Result Date: 07/26/2018 CLINICAL DATA:  Dizziness with unsteady gait EXAM: CT HEAD WITHOUT CONTRAST TECHNIQUE: Contiguous axial images were obtained from the base of the skull through the vertex without intravenous contrast. COMPARISON:  10/25/2017 MRI, CT brain 05/17/2017 FINDINGS: Brain: No evidence of acute infarction, hemorrhage, hydrocephalus, extra-axial collection or mass lesion/mass effect. Vascular: No hyperdense vessels. Vertebral artery and carotid vascular calcification Skull: Normal. Negative for fracture or focal lesion. Sinuses/Orbits: Mucosal thickening and retention cyst in the right maxillary sinus. Mild mucosal thickening in the ethmoid sinuses. Other: None IMPRESSION: 1. No CT evidence for acute intracranial abnormality. 2. Mild atrophy and minimal small vessel ischemic changes of the white  matter Electronically Signed   By: Jasmine Pang M.D.   On: 07/26/2018 17:41   Mr Brain Wo Contrast  Result Date: 07/27/2018 CLINICAL DATA:  Ataxia. EXAM: MRI HEAD WITHOUT CONTRAST TECHNIQUE: Multiplanar, multiecho pulse sequences of the brain and surrounding structures were obtained without intravenous contrast. COMPARISON:  Head CT 07/26/2018 and MRI 10/25/2017 FINDINGS: Brain: There is no evidence of acute infarct, mass, midline shift, or extra-axial fluid collection. A mega cisterna magna is a normal variant. The ventricles and sulci are within normal  limits for age. 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 Minimal hazy T2/FLAIR hyperintensity in the cerebral white matter and pons is unchanged from the prior MRI and nonspecific but compatible with chronic small vessel ischemic disease. A few scattered chronic microhemorrhages are again noted in the cerebral hemispheres, brainstem, and cerebellum, possibly related to chronic hypertension. Vascular: Major intracranial vascular flow voids are preserved. Skull and upper cervical spine: Unremarkable bone marrow signal. Sinuses/Orbits: Bilateral cataract extraction. Mild chronic right maxillary sinus mucosal thickening. Clear mastoid air cells. Other: None. IMPRESSION: 1. No acute intracranial abnormality. 2. Very mild chronic small vessel ischemic disease. Electronically Signed   By: Sebastian Ache M.D.   On: 07/27/2018 13:12       Subjective: Patient is feeling better, ambulation is close to baseline, able to move with help of a walker, no nausea or vomiting, no fever or chills.   Discharge Exam: Vitals:   07/28/18 0629 07/28/18 0824  BP: (!) 194/61 (!) 153/52  Pulse: 81 85  Resp:  18  Temp: 99.5 F (37.5 C) 98.5 F (36.9 C)  SpO2:  96%   Vitals:   07/28/18 0513 07/28/18 0600 07/28/18 0629 07/28/18 0824  BP: (!) 207/66  (!) 194/61 (!) 153/52  Pulse: 84  81 85  Resp: 18 (!) 24  18  Temp: 99.6 F (37.6 C)  99.5 F (37.5 C) 98.5 F (36.9 C)  TempSrc: Oral  Oral Oral  SpO2: 98%   96%  Weight:      Height:        General: Not in pain or dyspnea, Neurology: Awake and alert, non focal  E ENT: no pallor, no icterus, oral mucosa moist Cardiovascular: No JVD. S1-S2 present, rhythmic, no gallops, rubs, or murmurs. No lower extremity edema. Pulmonary: vesicular breath sounds bilaterally, adequate air movement, no wheezing, rhonchi or rales. Gastrointestinal. Abdomen flat, no organomegaly, non tender, no rebound or guarding Skin. No rashes Musculoskeletal: no joint  deformities   The results of significant diagnostics from this hospitalization (including imaging, microbiology, ancillary and laboratory) are listed below for reference.     Microbiology: No results found for this or any previous visit (from the past 240 hour(s)).   Labs: BNP (last 3 results) No results for input(s): BNP in the last 8760 hours. Basic Metabolic Panel: Recent Labs  Lab 07/26/18 1700 07/27/18 0459 07/28/18 0603  NA 134* 139 135  K 4.3 3.1* 3.8  CL 103 110 106  CO2 22 22 20*  GLUCOSE 511* 175* 241*  BUN 38* 24* 22  CREATININE 2.59* 1.62* 1.50*  CALCIUM 8.7* 8.8* 8.7*   Liver Function Tests: Recent Labs  Lab 07/26/18 1700 07/27/18 0459  AST 23 21  ALT 21 20  ALKPHOS 83 75  BILITOT 0.6 0.7  PROT 7.0 6.1*  ALBUMIN 3.7 3.1*   No results for input(s): LIPASE, AMYLASE in the last 168 hours. No results for input(s): AMMONIA in the last 168 hours. CBC:  Recent Labs  Lab 07/26/18 1700 07/27/18 0459  WBC 5.9 7.9  NEUTROABS 3.0  --   HGB 11.0* 10.5*  HCT 32.1* 31.1*  MCV 88.9 88.4  PLT 226 224   Cardiac Enzymes: Recent Labs  Lab 07/26/18 1700  TROPONINI <0.03   BNP: Invalid input(s): POCBNP CBG: Recent Labs  Lab 07/27/18 0618 07/27/18 1107 07/27/18 1527 07/27/18 2122 07/28/18 0619  GLUCAP 147* 122* 154* 212* 238*   D-Dimer No results for input(s): DDIMER in the last 72 hours. Hgb A1c Recent Labs    07/27/18 0459  HGBA1C 10.2*   Lipid Profile Recent Labs    07/27/18 0459  CHOL 163  HDL 30*  LDLCALC 108*  TRIG 124  CHOLHDL 5.4   Thyroid function studies No results for input(s): TSH, T4TOTAL, T3FREE, THYROIDAB in the last 72 hours.  Invalid input(s): FREET3 Anemia work up No results for input(s): VITAMINB12, FOLATE, FERRITIN, TIBC, IRON, RETICCTPCT in the last 72 hours. Urinalysis    Component Value Date/Time   COLORURINE YELLOW 07/26/2018 1700   APPEARANCEUR CLEAR 07/26/2018 1700   LABSPEC 1.015 07/26/2018 1700    PHURINE 5.5 07/26/2018 1700   GLUCOSEU >=500 (A) 07/26/2018 1700   GLUCOSEU 100 (A) 09/23/2015 0958   HGBUR NEGATIVE 07/26/2018 1700   BILIRUBINUR NEGATIVE 07/26/2018 1700   KETONESUR 15 (A) 07/26/2018 1700   PROTEINUR 30 (A) 07/26/2018 1700   UROBILINOGEN 0.2 09/23/2015 0958   NITRITE NEGATIVE 07/26/2018 1700   LEUKOCYTESUR NEGATIVE 07/26/2018 1700   Sepsis Labs Invalid input(s): PROCALCITONIN,  WBC,  LACTICIDVEN Microbiology No results found for this or any previous visit (from the past 240 hour(s)).   Time coordinating discharge: 45 minutes  SIGNED:   Coralie KeensMauricio Daniel Rosabelle Jupin, MD  [facility] 07/28/2018, 11:13 AM Pager [phone]  If 7PM-7AM, please contact night-coverage [email or web address] Password [email or web address]

## 2018-07-28 NOTE — Progress Notes (Signed)
Occupational Therapy Treatment Patient Details Name: Henry [name]. MRN: [id number] DOB: October 24, 1941 Today's Date: 07/28/2018    History of present illness 77 y.o. male with history of diabetes mellitus type 1, CAD s/p CABG, HTN, peripheral arterial disease, and chronic kidney disease. Presenting to [facility] ED with decreased balance during walking. CT showing no acute findings. Awaiting MRI.    OT comments  Pt progressing towards established OT goals. Providing pt with education and handout on tub transfers with RW and 3N1. Pt demonstrating understanding and performed tub transfer with Min A. Pt continues to present with decreased strength and balance as well as fear of falling. Continue to recommend dc home with HHOT and will continue to follow acutely as admitted.    Follow Up Recommendations  Home health OT;Supervision/Assistance - 24 hour    Equipment Recommendations  3 in 1 bedside commode    Recommendations for Other Services PT consult    Precautions / Restrictions Precautions Precautions: Fall Restrictions Weight Bearing Restrictions: No       Mobility Bed Mobility Overal bed mobility: Needs Assistance Bed Mobility: Supine to Sit     Supine to sit: Supervision     General bed mobility comments: supervision for safety  Transfers Overall transfer level: Needs assistance Equipment used: Rolling walker (2 wheeled) Transfers: Sit to/from Stand Sit to Stand: Min guard         General transfer comment: Min Guard A for safety and Min cues to remember hand placement    Balance Overall balance assessment: Needs assistance Sitting-balance support: Feet supported Sitting balance-Leahy Scale: Good     Standing balance support: Bilateral upper extremity supported;During functional activity Standing balance-Leahy Scale: Fair Standing balance comment: Able to maintain static standing without UE support                           ADL either  performed or assessed with clinical judgement   ADL Overall ADL's : Needs assistance/impaired                 Upper Body Dressing : Supervision/safety;Sitting Upper Body Dressing Details (indicate cue type and reason): Donning new gown            Tub/ Shower Transfer: Tub transfer;3 in 1;Ambulation;Rolling walker;Minimal assistance Tub/Shower Transfer Details (indicate cue type and reason): Educating pt on use of 3N1 as tub shower seat. Pt demosntrating understanding of tub transfer with RW and seat. Required Min A for safety. Pt with increased fear of falling ang shaking during transfer. Functional mobility during ADLs: Min guard;Rolling walker General ADL Comments: Pt continues to present with increased fear of falling. Pt BUE/BLEs shaking throughout session possibly due to weakness and anxiety with dynamic standing balance     Vision       Perception     Praxis      Cognition Arousal/Alertness: Awake/alert Behavior During Therapy: WFL for tasks assessed/performed Overall Cognitive Status: Within Functional Limits for tasks assessed                                          Exercises     Shoulder Instructions       General Comments Providing handout for tub transfer    Pertinent Vitals/ Pain       Pain Assessment: No/denies pain  Home Living  Prior Functioning/Environment              Frequency  Min 3X/week        Progress Toward Goals  OT Goals(current goals can now be found in the care plan section)  Progress towards OT goals: Progressing toward goals  Acute Rehab OT Goals Patient Stated Goal: to stop falling OT Goal Formulation: With patient Time For Goal Achievement: 08/10/18 Potential to Achieve Goals: Good ADL Goals Pt Will Perform Lower Body Dressing: with supervision;with set-up;sit to/from stand Pt Will Transfer to Toilet: with set-up;with  supervision;ambulating;regular height toilet Pt Will Perform Tub/Shower Transfer: Tub transfer;3 in 1;rolling walker;ambulating;with set-up;with supervision  Plan Discharge plan remains appropriate    Co-evaluation                 AM-PAC PT "6 Clicks" Daily Activity     Outcome Measure   Help from another person eating meals?: None Help from another person taking care of personal grooming?: None Help from another person toileting, which includes using toliet, bedpan, or urinal?: A Little Help from another person bathing (including washing, rinsing, drying)?: A Little Help from another person to put on and taking off regular upper body clothing?: None Help from another person to put on and taking off regular lower body clothing?: A Little 6 Click Score: 21    End of Session Equipment Utilized During Treatment: Gait belt;Rolling walker  OT Visit Diagnosis: Unsteadiness on feet (R26.81);Other abnormalities of gait and mobility (R26.89);Muscle weakness (generalized) (M62.81);History of falling (Z91.81)   Activity Tolerance Patient tolerated treatment well   Patient Left in chair;with call bell/phone within reach   Nurse Communication Mobility status        Time: [id number] OT Time Calculation (min): 25 min  Charges: OT General Charges $OT Visit: 1 Visit OT Treatments $Self Care/Home Management : 23-37 mins  Henry [name] MSOT, OTR/L Acute Rehab Pager: [phone] Office: [phone]   Henry [name] 07/28/2018, 8:30 AM

## 2018-07-30 ENCOUNTER — Telehealth: Payer: Self-pay | Admitting: Endocrinology

## 2018-07-30 ENCOUNTER — Encounter: Payer: Self-pay | Admitting: *Deleted

## 2018-07-30 NOTE — Telephone Encounter (Signed)
Patient was released from hospital on 07/28/18. He was advised by hospital to get in touch with his PCP to see if he needs to come in for appointment or not. Please call patient at ph# [phone] to advise.

## 2018-07-30 NOTE — Congregational Nurse Program (Signed)
[id number]/Rhonda Davis,BSN,RN3,CCM/TCT patient for check up for recent hospitalization.  States he is doing better still weak.  Use walker in the house with difficulties.  No other needs.  Following md orders and medications.

## 2018-07-30 NOTE — Telephone Encounter (Signed)
3:15 PM, 08/13/18

## 2018-07-31 ENCOUNTER — Telehealth: Payer: Self-pay | Admitting: Endocrinology

## 2018-07-31 DIAGNOSIS — I129 Hypertensive chronic kidney disease with stage 1 through stage 4 chronic kidney disease, or unspecified chronic kidney disease: Secondary | ICD-10-CM | POA: Diagnosis not present

## 2018-07-31 DIAGNOSIS — E1051 Type 1 diabetes mellitus with diabetic peripheral angiopathy without gangrene: Secondary | ICD-10-CM | POA: Diagnosis not present

## 2018-07-31 DIAGNOSIS — Z7982 Long term (current) use of aspirin: Secondary | ICD-10-CM | POA: Diagnosis not present

## 2018-07-31 DIAGNOSIS — E103599 Type 1 diabetes mellitus with proliferative diabetic retinopathy without macular edema, unspecified eye: Secondary | ICD-10-CM | POA: Diagnosis not present

## 2018-07-31 DIAGNOSIS — Z791 Long term (current) use of non-steroidal anti-inflammatories (NSAID): Secondary | ICD-10-CM | POA: Diagnosis not present

## 2018-07-31 DIAGNOSIS — Z794 Long term (current) use of insulin: Secondary | ICD-10-CM | POA: Diagnosis not present

## 2018-07-31 DIAGNOSIS — E1122 Type 2 diabetes mellitus with diabetic chronic kidney disease: Secondary | ICD-10-CM | POA: Diagnosis not present

## 2018-07-31 DIAGNOSIS — R260 Ataxic gait: Secondary | ICD-10-CM | POA: Diagnosis not present

## 2018-07-31 DIAGNOSIS — I2510 Atherosclerotic heart disease of native coronary artery without angina pectoris: Secondary | ICD-10-CM | POA: Diagnosis not present

## 2018-07-31 DIAGNOSIS — N183 Chronic kidney disease, stage 3 (moderate): Secondary | ICD-10-CM | POA: Diagnosis not present

## 2018-07-31 DIAGNOSIS — Z951 Presence of aortocoronary bypass graft: Secondary | ICD-10-CM | POA: Diagnosis not present

## 2018-07-31 DIAGNOSIS — E785 Hyperlipidemia, unspecified: Secondary | ICD-10-CM | POA: Diagnosis not present

## 2018-07-31 DIAGNOSIS — Z87891 Personal history of nicotine dependence: Secondary | ICD-10-CM | POA: Diagnosis not present

## 2018-07-31 DIAGNOSIS — E1065 Type 1 diabetes mellitus with hyperglycemia: Secondary | ICD-10-CM | POA: Diagnosis not present

## 2018-07-31 NOTE — Telephone Encounter (Signed)
I have spoken with patient & given Judeth CornfieldStephanie info to get him scheduled.

## 2018-07-31 NOTE — Telephone Encounter (Signed)
Tresa EndoKelly is calling from [facility] # [phone]  She is requesting VO for home health PT and nursing

## 2018-08-01 ENCOUNTER — Telehealth: Payer: Self-pay | Admitting: Endocrinology

## 2018-08-01 DIAGNOSIS — N183 Chronic kidney disease, stage 3 (moderate): Secondary | ICD-10-CM | POA: Diagnosis not present

## 2018-08-01 DIAGNOSIS — I129 Hypertensive chronic kidney disease with stage 1 through stage 4 chronic kidney disease, or unspecified chronic kidney disease: Secondary | ICD-10-CM | POA: Diagnosis not present

## 2018-08-01 DIAGNOSIS — E1065 Type 1 diabetes mellitus with hyperglycemia: Secondary | ICD-10-CM | POA: Diagnosis not present

## 2018-08-01 DIAGNOSIS — I2510 Atherosclerotic heart disease of native coronary artery without angina pectoris: Secondary | ICD-10-CM | POA: Diagnosis not present

## 2018-08-01 DIAGNOSIS — E1122 Type 2 diabetes mellitus with diabetic chronic kidney disease: Secondary | ICD-10-CM | POA: Diagnosis not present

## 2018-08-01 DIAGNOSIS — R260 Ataxic gait: Secondary | ICD-10-CM | POA: Diagnosis not present

## 2018-08-01 NOTE — Telephone Encounter (Signed)
Please advise if ok to give verbal order

## 2018-08-01 NOTE — Telephone Encounter (Signed)
PT was given the verbal OK now they want the OK for OT

## 2018-08-01 NOTE — Telephone Encounter (Signed)
OK 

## 2018-08-01 NOTE — Telephone Encounter (Signed)
Called and left the OK [facility]ic[facility]

## 2018-08-01 NOTE — Telephone Encounter (Signed)
Ritu @ [facility] needing to get verbal orders for patient to have OT. Best number [phone]

## 2018-08-02 DIAGNOSIS — R260 Ataxic gait: Secondary | ICD-10-CM | POA: Diagnosis not present

## 2018-08-02 DIAGNOSIS — N183 Chronic kidney disease, stage 3 (moderate): Secondary | ICD-10-CM | POA: Diagnosis not present

## 2018-08-02 DIAGNOSIS — E1122 Type 2 diabetes mellitus with diabetic chronic kidney disease: Secondary | ICD-10-CM | POA: Diagnosis not present

## 2018-08-02 DIAGNOSIS — I2510 Atherosclerotic heart disease of native coronary artery without angina pectoris: Secondary | ICD-10-CM | POA: Diagnosis not present

## 2018-08-02 DIAGNOSIS — E1065 Type 1 diabetes mellitus with hyperglycemia: Secondary | ICD-10-CM | POA: Diagnosis not present

## 2018-08-02 DIAGNOSIS — I129 Hypertensive chronic kidney disease with stage 1 through stage 4 chronic kidney disease, or unspecified chronic kidney disease: Secondary | ICD-10-CM | POA: Diagnosis not present

## 2018-08-02 NOTE — Telephone Encounter (Signed)
I called & spoke to Ritu to give her verbal orders.

## 2018-08-06 DIAGNOSIS — E1065 Type 1 diabetes mellitus with hyperglycemia: Secondary | ICD-10-CM | POA: Diagnosis not present

## 2018-08-06 DIAGNOSIS — E1122 Type 2 diabetes mellitus with diabetic chronic kidney disease: Secondary | ICD-10-CM | POA: Diagnosis not present

## 2018-08-06 DIAGNOSIS — I2510 Atherosclerotic heart disease of native coronary artery without angina pectoris: Secondary | ICD-10-CM | POA: Diagnosis not present

## 2018-08-06 DIAGNOSIS — R260 Ataxic gait: Secondary | ICD-10-CM | POA: Diagnosis not present

## 2018-08-06 DIAGNOSIS — I129 Hypertensive chronic kidney disease with stage 1 through stage 4 chronic kidney disease, or unspecified chronic kidney disease: Secondary | ICD-10-CM | POA: Diagnosis not present

## 2018-08-06 DIAGNOSIS — N183 Chronic kidney disease, stage 3 (moderate): Secondary | ICD-10-CM | POA: Diagnosis not present

## 2018-08-07 ENCOUNTER — Telehealth: Payer: Self-pay | Admitting: Endocrinology

## 2018-08-07 ENCOUNTER — Encounter (HOSPITAL_BASED_OUTPATIENT_CLINIC_OR_DEPARTMENT_OTHER): Payer: Self-pay

## 2018-08-07 ENCOUNTER — Emergency Department (HOSPITAL_BASED_OUTPATIENT_CLINIC_OR_DEPARTMENT_OTHER)
Admission: EM | Admit: 2018-08-07 | Discharge: 2018-08-08 | Disposition: A | Discharge status: Home / Self Care | Payer: Medicare Other | Attending: Emergency Medicine | Admitting: Emergency Medicine

## 2018-08-07 ENCOUNTER — Emergency Department (HOSPITAL_BASED_OUTPATIENT_CLINIC_OR_DEPARTMENT_OTHER): Payer: Medicare Other

## 2018-08-07 ENCOUNTER — Other Ambulatory Visit: Payer: Self-pay

## 2018-08-07 DIAGNOSIS — Z79899 Other long term (current) drug therapy: Secondary | ICD-10-CM | POA: Diagnosis not present

## 2018-08-07 DIAGNOSIS — E1022 Type 1 diabetes mellitus with diabetic chronic kidney disease: Secondary | ICD-10-CM | POA: Diagnosis not present

## 2018-08-07 DIAGNOSIS — Y929 Unspecified place or not applicable: Secondary | ICD-10-CM | POA: Insufficient documentation

## 2018-08-07 DIAGNOSIS — I2510 Atherosclerotic heart disease of native coronary artery without angina pectoris: Secondary | ICD-10-CM | POA: Insufficient documentation

## 2018-08-07 DIAGNOSIS — Y999 Unspecified external cause status: Secondary | ICD-10-CM | POA: Diagnosis not present

## 2018-08-07 DIAGNOSIS — N183 Chronic kidney disease, stage 3 (moderate): Secondary | ICD-10-CM | POA: Insufficient documentation

## 2018-08-07 DIAGNOSIS — Y93E1 Activity, personal bathing and showering: Secondary | ICD-10-CM | POA: Diagnosis not present

## 2018-08-07 DIAGNOSIS — Z951 Presence of aortocoronary bypass graft: Secondary | ICD-10-CM | POA: Diagnosis not present

## 2018-08-07 DIAGNOSIS — Z87891 Personal history of nicotine dependence: Secondary | ICD-10-CM | POA: Insufficient documentation

## 2018-08-07 DIAGNOSIS — W182XXA Fall in (into) shower or empty bathtub, initial encounter: Secondary | ICD-10-CM | POA: Insufficient documentation

## 2018-08-07 DIAGNOSIS — S2232XA Fracture of one rib, left side, initial encounter for closed fracture: Secondary | ICD-10-CM

## 2018-08-07 DIAGNOSIS — Z7982 Long term (current) use of aspirin: Secondary | ICD-10-CM | POA: Diagnosis not present

## 2018-08-07 DIAGNOSIS — I129 Hypertensive chronic kidney disease with stage 1 through stage 4 chronic kidney disease, or unspecified chronic kidney disease: Secondary | ICD-10-CM | POA: Insufficient documentation

## 2018-08-07 DIAGNOSIS — S299XXA Unspecified injury of thorax, initial encounter: Secondary | ICD-10-CM | POA: Diagnosis present

## 2018-08-07 NOTE — Telephone Encounter (Signed)
OK, but please also offer OV

## 2018-08-07 NOTE — ED Provider Notes (Signed)
[facility] EMERGENCY DEPARTMENT Provider Note   CSN: [id number] Arrival date & time: 08/07/18  2229     History   Chief Complaint Chief Complaint  Patient presents with  . Fall    HPI Henry [name]. is a 77 y.o. male.  Patient is a 77 year old male presenting with complaints of left rib pain.  He reports losing his balance and falling in the shower 2 days ago.  His pain is worse with movement and breathing.  He denies any shortness of breath.  He denies any back pain or radiation into his legs.  He was recently admitted at [facility] after 2 similar episodes and underwent multiple studies including MRI, CT scan, laboratory studies.  He has an upcoming appointment to discuss these episodes with neurology on Monday.  The history is provided by the patient.  Fall  This is a recurrent problem. The current episode started 2 days ago. The problem occurs constantly. The problem has been gradually worsening. Pertinent negatives include no chest pain and no shortness of breath. Exacerbated by: Breathing and palpation. Nothing relieves the symptoms. He has tried nothing for the symptoms.    Past Medical History:  Diagnosis Date  . ANEMIA-NOS 07/10/2007  . CORONARY ARTERY DISEASE 07/10/2007  . DIABETES MELLITUS, TYPE I 07/10/2007  . HYPERLIPIDEMIA 07/10/2007  . HYPERTENSION 07/10/2007  . Peripheral arterial disease (HCC)   . 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 RENAL INSUFFICIENCY 07/10/2007    Patient Active Problem List   Diagnosis Date Noted  . Renal failure (ARF), acute on chronic (HCC) 07/27/2018  . Ataxia 07/26/2018  . Critical lower limb ischemia 03/01/2018  . Leg ulcer (HCC) 12/05/2017  . 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 Diabetic gastroparesis (HCC)   . Abdominal pain 09/10/2017  . Facial problem 08/06/2017  . CKD (chronic kidney disease), stage III (HCC) 05/17/2017  . Transient alteration of awareness  05/17/2017  . Hypoglycemia due to insulin 05/17/2017  . Peripheral arterial disease (HCC) 11/04/2014  . Claudication (HCC) 09/24/2014  . Premature ventricular contractions (PVCs) (VPCs) 04/06/2014  . NSVT (nonsustained ventricular tachycardia) (HCC) 04/06/2014  . Diabetic peripheral neuropathy (HCC) 02/04/2014  . Dizziness 07/10/2013  . Diabetes (HCC) 06/13/2013  . 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 Screening for prostate cancer 05/12/2011  . Pure hypercholesterolemia 05/12/2011  . Encounter for long-term (current) use of other medications 05/12/2011  . 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 PROLIFERATIVE DIABETIC RETINOPATHY 05/05/2010  . Dyslipidemia 07/10/2007  . Anemia 07/10/2007  . Essential hypertension 07/10/2007  . Coronary atherosclerosis 07/10/2007  . Disorder resulting from impaired renal function 07/10/2007    Past Surgical History:  Procedure Laterality Date  . APPENDECTOMY    . CARDIAC CATHETERIZATION     stenting in the vein graft to the right coronary artery and had_DES 3x 26 mm Integrity stent inserted, post dilated to 3.4 mm  . CAROTID ENDARTERECTOMY Right    right  . CHOLECYSTECTOMY N/A 09/13/2017   Procedure: LAPAROSCOPIC CHOLECYSTECTOMY;  Surgeon: Kinsinger, De Blanch, MD;  Location: [facility] ORS;  Service: General;  Laterality: N/A;  . CORONARY ARTERY BYPASS GRAFT  02/2010   PTCA of his right coroary artery.   . ESOPHAGOGASTRODUODENOSCOPY N/A 09/11/2017   Procedure: ESOPHAGOGASTRODUODENOSCOPY (EGD);  Surgeon: Hilarie Fredrickson, MD;  Location: [facility];  Service: Endoscopy;  Laterality: N/A;        Home Medications    Prior to Admission medications  Medication Sig Start Date End Date Taking? Authorizing Provider  acetaminophen (TYLENOL) 500 MG tablet Take 500 mg by mouth daily as needed (pain).    [provider]  aspirin EC 81 MG tablet Take 81 mg by mouth daily.    [provider]  B  Complex-C-Folic Acid (FOLBEE PLUS) TABS Take 1 tablet by mouth daily.      [provider]  Cholecalciferol (VITAMIN D) 2000 UNITS CAPS Take 2,000 Units by mouth daily.     [provider]  furosemide (LASIX) 20 MG tablet Take 20 mg by mouth daily.     [provider]  gabapentin (NEURONTIN) 100 MG capsule Take 100 mg by mouth at bedtime.     [provider]  insulin NPH-regular Human (NOVOLIN 70/30) (70-30) 100 UNIT/ML injection 43 units with breakfast and 26 units with supper Patient taking differently: Inject 26-40 Units into the skin See admin instructions. Inject 40 units subcutaneously before breakfast and 26 units before supper 03/06/18   Romero BellingEllison, Sean, MD  isosorbide mononitrate (IMDUR) 60 MG 24 hr tablet TAKE 1 TABLET BY MOUTH DAILY 05/28/18   Lennette BihariKelly, Thomas A, MD  labetalol (NORMODYNE) 200 MG tablet Take 1 tablet (200 mg total) by mouth 2 (two) times daily. NEED OV. 12/10/17   Lennette BihariKelly, Thomas A, MD  omeprazole (PRILOSEC OTC) 20 MG tablet Take 20 mg by mouth daily.    [provider]    Family History Family History  Problem Relation Age of Onset  . Heart disease Mother   . Cancer Neg Hx     Social History Social History   Tobacco Use  . Smoking status: Former Games developermoker  . Smokeless tobacco: Never Used  Substance Use Topics  . Alcohol use: No  . Drug use: No     Allergies   Hydralazine; Penicillins; Amlodipine besylate; Codeine; Contrast media [iodinated diagnostic agents]; Lipitor [atorvastatin calcium]; Valsartan; and Zetia [ezetimibe]   Review of Systems Review of Systems  Respiratory: Negative for shortness of breath.   Cardiovascular: Negative for chest pain.  All other systems reviewed and are negative.    Physical Exam Updated Vital Signs BP (!) 176/58 (BP Location: Right Arm)   Pulse 78   Temp 98.6 F (37 C) (Oral)   Resp 18   SpO2 96%   Physical Exam  Constitutional: He is oriented to person, place, and time. He  appears well-developed and well-nourished. No distress.  HENT:  Head: Normocephalic and atraumatic.  Mouth/Throat: Oropharynx is clear and moist.  Neck: Normal range of motion. Neck supple.  Cardiovascular: Normal rate and regular rhythm. Exam reveals no friction rub.  No murmur heard. Pulmonary/Chest: Effort normal and breath sounds normal. No respiratory distress. He has no wheezes. He has no rales. He exhibits tenderness.  There is ttp to the left lateral ribs.  Breath sounds are equal bilaterally.  Abdominal: Soft. Bowel sounds are normal. He exhibits no distension. There is no tenderness.  Musculoskeletal: Normal range of motion. He exhibits no edema.  Neurological: He is alert and oriented to person, place, and time. Coordination normal.  Skin: Skin is warm and dry. He is not diaphoretic.  Nursing note and vitals reviewed.    ED Treatments / Results  Labs (all labs ordered are listed, but only abnormal results are displayed) Labs Reviewed - No data to display  EKG None  Radiology No results found.  Procedures Procedures (including critical care time)  Medications Ordered in ED Medications - No data to  display   Initial Impression / Assessment and Plan / ED Course  I have reviewed the triage vital signs and the nursing notes.  Pertinent labs & imaging results that were available during my care of the patient were reviewed by me and considered in my medical decision making (see chart for details).  X-rays show a 10th rib fracture on the left.  This will be treated with pain medication and follow-up as needed.  He has an appointment with neurology to help attempt to explain his dizzy episodes.  Final Clinical Impressions(s) / ED Diagnoses   Final diagnoses:  None    ED Discharge Orders    None       Geoffery Lyonselo, Bunyan Brier, MD 08/08/18 [phone]

## 2018-08-07 NOTE — Telephone Encounter (Signed)
[facility] is calling to received verbal orders. Patient fell on Monday in the shower and has some swelling on the lower part of his rib cage. They would like a verbal order for ice application  Please advise She stated if she does not answer they a voicemail could be left.   Graybar ElectricBeth  [phone]

## 2018-08-07 NOTE — ED Triage Notes (Signed)
Pt states he fell in shower 2 days ago-pain to left lower back-reports hx of recent fall with admn to [facility]-to triage in w/c

## 2018-08-07 NOTE — ED Notes (Signed)
ED Provider at bedside. 

## 2018-08-07 NOTE — Telephone Encounter (Signed)
Please advise 

## 2018-08-08 ENCOUNTER — Telehealth: Payer: Self-pay

## 2018-08-08 DIAGNOSIS — E1122 Type 2 diabetes mellitus with diabetic chronic kidney disease: Secondary | ICD-10-CM | POA: Diagnosis not present

## 2018-08-08 DIAGNOSIS — I2510 Atherosclerotic heart disease of native coronary artery without angina pectoris: Secondary | ICD-10-CM | POA: Diagnosis not present

## 2018-08-08 DIAGNOSIS — N183 Chronic kidney disease, stage 3 (moderate): Secondary | ICD-10-CM | POA: Diagnosis not present

## 2018-08-08 DIAGNOSIS — I129 Hypertensive chronic kidney disease with stage 1 through stage 4 chronic kidney disease, or unspecified chronic kidney disease: Secondary | ICD-10-CM | POA: Diagnosis not present

## 2018-08-08 DIAGNOSIS — R260 Ataxic gait: Secondary | ICD-10-CM | POA: Diagnosis not present

## 2018-08-08 DIAGNOSIS — E1065 Type 1 diabetes mellitus with hyperglycemia: Secondary | ICD-10-CM | POA: Diagnosis not present

## 2018-08-08 MED ORDER — HYDROCODONE-ACETAMINOPHEN 5-325 MG PO TABS
2.0000 | ORAL_TABLET | Freq: Once | ORAL | Status: DC
  Filled 2018-08-08: qty 2

## 2018-08-08 MED ORDER — HYDROCODONE-ACETAMINOPHEN 5-325 MG PO TABS: 1.0000 | ORAL_TABLET | Freq: Four times a day (QID) | ORAL | 0 refills | Status: DC | PRN

## 2018-08-08 NOTE — Telephone Encounter (Signed)
Beth from [facility] called to report the patient had a fall and xray shows rib fracture- patient was prescribed hydrocodone and is experiencing constipation he was put on colace to help with this but would like some instruction  from Dr. Everardo AllEllison on what he needs to do now- patient has an appointment on 08/13/18 and wants to know if he should come in sooner- if patient does not need to come in sooner he would like dosage adjustment for colace- please advise

## 2018-08-08 NOTE — Telephone Encounter (Signed)
I spoke with Beth to give verbal orders. She stated that patient was able to participate in therapy session, but was sore. She would see him later today & see if she felt that he needed to be seen that she would have him call our office.

## 2018-08-08 NOTE — Telephone Encounter (Signed)
2:15 PM, 08/09/18 

## 2018-08-08 NOTE — Discharge Instructions (Addendum)
Hydrocodone as prescribed as needed for pain. ° °Follow-up with your primary doctor in the next 1 to 2 weeks, and return to the ER if symptoms significantly worsen or change. °

## 2018-08-08 NOTE — Progress Notes (Signed)
RN instructed patient in the correct use of Incentive Spirometry

## 2018-08-09 ENCOUNTER — Telehealth: Payer: Self-pay | Admitting: Endocrinology

## 2018-08-09 DIAGNOSIS — N183 Chronic kidney disease, stage 3 (moderate): Secondary | ICD-10-CM | POA: Diagnosis not present

## 2018-08-09 DIAGNOSIS — R260 Ataxic gait: Secondary | ICD-10-CM | POA: Diagnosis not present

## 2018-08-09 DIAGNOSIS — I129 Hypertensive chronic kidney disease with stage 1 through stage 4 chronic kidney disease, or unspecified chronic kidney disease: Secondary | ICD-10-CM | POA: Diagnosis not present

## 2018-08-09 DIAGNOSIS — E1122 Type 2 diabetes mellitus with diabetic chronic kidney disease: Secondary | ICD-10-CM | POA: Diagnosis not present

## 2018-08-09 DIAGNOSIS — I2510 Atherosclerotic heart disease of native coronary artery without angina pectoris: Secondary | ICD-10-CM | POA: Diagnosis not present

## 2018-08-09 DIAGNOSIS — E1065 Type 1 diabetes mellitus with hyperglycemia: Secondary | ICD-10-CM | POA: Diagnosis not present

## 2018-08-09 NOTE — Telephone Encounter (Signed)
Noreene LarssonJill with [facility] ph# [phone] called re: She needs parameters for patient's blood sugars. Please call Noreene LarssonJill at the above phone#

## 2018-08-11 NOTE — Progress Notes (Signed)
NEUROLOGY FOLLOW UP OFFICE NOTE  Henry LessGlenn T Boyde [name]. [id number]  HISTORY OF PRESENT ILLNESS: Henry [name] is a 77 year old right-handed male with type 1 diabetes with retinopathy, CAD, HTN, HLD, PAD, and carotid artery disease s/p carotid endarterectomy who follows up for dizziness.    UPDATE: I saw him in August 2018 for episode of dizziness.  MRI of brain from 10/25/17 was personally reviewed and showed small chronic right pontine lacunar infarct.  MRA of head showed extensive atheromatous disease with severe stenoses of the distal left cervical ICA, bilateral cavernous segments and severe near occlusive proximal right P2 stenosis.  MRA of neck showed atheromatous disease with 40-50% of the bilateral carotid bifurcations, moderate right and severe left stenoses of distal cervical/petrous ICAs bilaterally, and multifocal atheromatous irregularity of the V2 segments bilaterally, worse on the left with 50-60% stenosis.  He was advised to continue ASA and reconsider restarting statin therapy.  To evaluate transient altered awareness, he had an EEG on 09/03/17, which was normal.  He was [facility] to [facility] on 07/26/18 for several days of progressive difficulty ambulating with frequent falls.  He reports similar events. It is not a dizziness such as spinning or lightheadedness.  Each time he falls, his eyes are closed.  Such as standing in the shower with soap in his eyes.  Another time, he was walking in the driveway and just tripped.  Another time, he turned and fell.  The fall feels like slow motion but he cannot catch himself.  MRI of brain was personally reviewed and was unchanged compared to prior imaging from November 2018.  He takes gabapentin for neuropathic pain from diabetic neuropathy.  His diabetes was fairly under control until just before his first fall about a month ago.  Last A1c 2 months ago was 10.5.  His wife says that sometimes he slurs his speech and the right side of his  mouth pulls up in the past.  HISTORY: In early August 2018, he woke up one morning and when sitting at the side of the bed, he felt himself being pulled to the left.  When he got up, he had to hold onto furniture to keep his balance.  However, he did not feel dizzy nor experience facial droop, diplopia, dysphagia, tinnitus, headache or unilateral numbness and weakness.  He checked his blood pressure, which was around 130/85.  He checked his blood sugar, which was 134.  He improved throughout the day.  Since then, he has been unsteady on his feet.  He at times stumbles.  When he is walking quickly, he is fine.  However, if he stands and looks up, he develops spinning sensation.  It occurs until he is able to sit down.  He feels fine when he sits down.  He has had vertigo in the past, but that was associated with nausea.  He doesn't feel sick with this.  About 10 years ago, he underwent bilateral stapedectomy.  However, he reports his hearing has gotten worse.  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  08/06/17: Hgb A1c 10.2  Of note, he had an episode of confusion in May 2018. He was evaluated in the ED on 05/17/17 for altered mental status.  He has prior history of hypoglycemic episodes, but this was different.  He felt funny and exhibited slurred and nonsensical speech.  He was diaphoretic.  He and his wife thought he may be hypoglycemic, so he drank orange juice, which dripped  out of his mouth.  He did not have any lateralizing symptoms such as facial droop or unilateral weakness.  He did not have loss of consciousness but he was amnestic to the event and didn't become aware until he was in the ambulance.  He presented to the ED for further evaluation.  CT of head was personally reviewed and revealed mild chronic small vessel ischemic changes but no acute abnormalities.  Blood pressure was 217/61.  Blood glucose was 116.  Admission was recommended to evaluate for possible seizure, but patient left  AMA.  PAST MEDICAL HISTORY: Past Medical History:  Diagnosis Date  . ANEMIA-NOS 07/10/2007  . CORONARY ARTERY DISEASE 07/10/2007  . DIABETES MELLITUS, TYPE I 07/10/2007  . HYPERLIPIDEMIA 07/10/2007  . HYPERTENSION 07/10/2007  . Peripheral arterial disease (HCC)   . 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 RENAL INSUFFICIENCY 07/10/2007    MEDICATIONS: Current Outpatient Medications on File Prior to Visit  Medication Sig Dispense Refill  . acetaminophen (TYLENOL) 500 MG tablet Take 500 mg by mouth daily as needed (pain).    Marland Kitchen aspirin EC 81 MG tablet Take 81 mg by mouth daily.    . B Complex-C-Folic Acid (FOLBEE PLUS) TABS Take 1 tablet by mouth daily.      . Cholecalciferol (VITAMIN D) 2000 UNITS CAPS Take 2,000 Units by mouth daily.     . furosemide (LASIX) 20 MG tablet Take 20 mg by mouth daily.     Marland Kitchen gabapentin (NEURONTIN) 100 MG capsule Take 100 mg by mouth at bedtime.     Marland Kitchen HYDROcodone-acetaminophen (NORCO) 5-325 MG tablet Take 1-2 tablets by mouth every 6 (six) hours as needed. 20 tablet 0  . insulin NPH-regular Human (NOVOLIN 70/30) (70-30) 100 UNIT/ML injection 43 units with breakfast and 26 units with supper (Patient taking differently: Inject 26-40 Units into the skin See admin instructions. Inject 40 units subcutaneously before breakfast and 26 units before supper) 20 mL 11  . isosorbide mononitrate (IMDUR) 60 MG 24 hr tablet TAKE 1 TABLET BY MOUTH DAILY 30 tablet 9  . labetalol (NORMODYNE) 200 MG tablet Take 1 tablet (200 mg total) by mouth 2 (two) times daily. NEED OV. 60 tablet 0  . omeprazole (PRILOSEC OTC) 20 MG tablet Take 20 mg by mouth daily.     No current facility-administered medications on file prior to visit.     ALLERGIES: Allergies  Allergen Reactions  . Hydralazine Swelling    Feet swelled so bad they were cracking Severe foot swelling  . Penicillins Other (See Comments)    Per allergy test. (pt states he was never given penicillin)  .  Amlodipine Besylate Swelling  . Codeine Other (See Comments)    anxiety  . Contrast Media [Iodinated Diagnostic Agents] Other (See Comments)    Kidney issues when given  . Lipitor [Atorvastatin Calcium] Other (See Comments)    myalgia  . Valsartan Other (See Comments) and Swelling    Caused kidney damage  . Zetia [Ezetimibe] Other (See Comments)    Severe dizziness    FAMILY HISTORY: Family History  Problem Relation Age of Onset  . Heart disease Mother   . Cancer Neg Hx     SOCIAL HISTORY: Social History   Socioeconomic History  . Marital status: Married    Spouse name: Not on file  . Number of children: Not on file  . Years of education: BS  . Highest education level: Not on file  Occupational History  . Occupation: retired  Comment: 14 yrs ago  Social Needs  . Financial resource strain: Not on file  . Food insecurity:    Worry: Not on file    Inability: Not on file  . Transportation needs:    Medical: Not on file    Non-medical: Not on file  Tobacco Use  . Smoking status: Former Games developermoker  . Smokeless tobacco: Never Used  Substance and Sexual Activity  . Alcohol use: No  . Drug use: No  . Sexual activity: Not on file  Lifestyle  . Physical activity:    Days per week: Not on file    Minutes per session: Not on file  . Stress: Not on file  Relationships  . Social connections:    Talks on phone: Not on file    Gets together: Not on file    Attends religious service: Not on file    Active member of club or organization: Not on file    Attends meetings of clubs or organizations: Not on file    Relationship status: Not on file  . Intimate partner violence:    Fear of current or ex partner: Not on file    Emotionally abused: Not on file    Physically abused: Not on file    Forced sexual activity: Not on file  Other Topics Concern  . Not on file  Social History Narrative   Says his diet and exercise are very good   Married lives with wife    REVIEW OF  SYSTEMS: Constitutional: No fevers, chills, or sweats, no generalized fatigue, change in appetite Eyes: No visual changes, double vision, eye pain Ear, nose and throat: No hearing loss, ear pain, nasal congestion, sore throat Cardiovascular: No chest pain, palpitations Respiratory:  No shortness of breath at rest or with exertion, wheezes GastrointestinaI: No nausea, vomiting, diarrhea, abdominal pain, fecal incontinence Genitourinary:  No dysuria, urinary retention or frequency Musculoskeletal:  No neck pain, back pain Integumentary: No rash, pruritus, skin lesions Neurological: as above Psychiatric: No depression, insomnia, anxiety Endocrine: No palpitations, fatigue, diaphoresis, mood swings, change in appetite, change in weight, increased thirst Hematologic/Lymphatic:  No purpura, petechiae. Allergic/Immunologic: no itchy/runny eyes, nasal congestion, recent allergic reactions, rashes  PHYSICAL EXAM: Blood pressure 92/60, pulse 72, height 5\' 10"  (1.778 m), weight 159 lb (72.1 kg), SpO2 94 %. General: No acute distress.  Patient appears well-groomed.   Head:  Normocephalic/atraumatic Eyes:  Fundi examined but not visualized Neck: supple, no paraspinal tenderness, full range of motion Heart:  Regular rate and rhythm Lungs:  Clear to auscultation bilaterally Back: No paraspinal tenderness Neurological Exam: alert and oriented to person, place, and time. Attention span and concentration intact, recent and remote memory intact, fund of knowledge intact.  Speech fluent and not dysarthric, language intact.  Decreased hearing bilaterally.  Otherwise, CN II-XII intact. Bulk and tone normal, 5-/5 left hand grip.  Otherwise, muscle strength 5/5 throughout.  Sensation to light touch, temperature intact.  Reduced vibration sensation in feet.  Deep tendon reflexes 2+ throughout, questionable left Babinski.  Right toes downgoing.  Finger to nose testing intact.  Gait shuffling and unsteady.  Romberg  positive.    IMPRESSION: Frequent falls, unsteady gait.  Given the questionable Babinski sign, consider myelopathy from cervical spinal stenosis.  Alternatively, may be secondary to diabetic polyneuropathy.  Left sided symptoms may be explained from chronic pontine infarct, which has been seen on prior MRI in November.  PLAN: 1.  Check MRI of cervical spine to evaluate for  cervical spinal stenosis 2.  Continue PT and fall precautions.  Use walker 3.  Continue ASA and optimize medical management for cerebrovascular disease (choleterol/glycemic/blood pressure control) 4.  Follow up after testing.  25 minutes spent face to face with patient, over 50% spent discussing management.  Shon Millet, DO  CC:  Romero Belling, MD

## 2018-08-12 ENCOUNTER — Ambulatory Visit (INDEPENDENT_AMBULATORY_CARE_PROVIDER_SITE_OTHER): Payer: Medicare Other | Admitting: Neurology

## 2018-08-12 ENCOUNTER — Encounter

## 2018-08-12 ENCOUNTER — Encounter: Payer: Self-pay | Admitting: Neurology

## 2018-08-12 VITALS — BP 92/60 | HR 72 | Ht 70.0 in | Wt 159.0 lb

## 2018-08-12 DIAGNOSIS — R2681 Unsteadiness on feet: Secondary | ICD-10-CM | POA: Diagnosis not present

## 2018-08-12 DIAGNOSIS — R296 Repeated falls: Secondary | ICD-10-CM

## 2018-08-12 DIAGNOSIS — I6523 Occlusion and stenosis of bilateral carotid arteries: Secondary | ICD-10-CM | POA: Diagnosis not present

## 2018-08-12 DIAGNOSIS — E1042 Type 1 diabetes mellitus with diabetic polyneuropathy: Secondary | ICD-10-CM | POA: Diagnosis not present

## 2018-08-12 DIAGNOSIS — R292 Abnormal reflex: Secondary | ICD-10-CM

## 2018-08-12 NOTE — Telephone Encounter (Signed)
I have spoken to [place] to give her parameters.

## 2018-08-12 NOTE — Patient Instructions (Addendum)
1.  We will check MRI of cervical spine to evaluate for cervical spinal stenosis 2.  Continue physical therapy and safety precautions 3.  Follow up after testing  We have sent a referral to [facility] for your MRI and they will call you directly to schedule your appt. They are located at [address] If you need to contact them directly please call [phone].

## 2018-08-12 NOTE — Telephone Encounter (Signed)
Call if cbg is over 400.

## 2018-08-12 NOTE — Telephone Encounter (Signed)
ok 

## 2018-08-12 NOTE — Telephone Encounter (Signed)
Since 8/16 has passed is it still ok for patient to be seen tomorrow?

## 2018-08-13 ENCOUNTER — Encounter: Payer: Self-pay | Admitting: Endocrinology

## 2018-08-13 ENCOUNTER — Ambulatory Visit (INDEPENDENT_AMBULATORY_CARE_PROVIDER_SITE_OTHER): Payer: Medicare Other | Admitting: Endocrinology

## 2018-08-13 ENCOUNTER — Encounter

## 2018-08-13 ENCOUNTER — Other Ambulatory Visit (INDEPENDENT_AMBULATORY_CARE_PROVIDER_SITE_OTHER): Payer: Medicare Other

## 2018-08-13 VITALS — BP 170/62 | HR 66 | Ht 70.0 in | Wt 159.6 lb

## 2018-08-13 DIAGNOSIS — E1065 Type 1 diabetes mellitus with hyperglycemia: Secondary | ICD-10-CM

## 2018-08-13 DIAGNOSIS — D649 Anemia, unspecified: Secondary | ICD-10-CM

## 2018-08-13 DIAGNOSIS — E1029 Type 1 diabetes mellitus with other diabetic kidney complication: Secondary | ICD-10-CM

## 2018-08-13 DIAGNOSIS — IMO0002 Reserved for concepts with insufficient information to code with codable children: Secondary | ICD-10-CM

## 2018-08-13 DIAGNOSIS — I6523 Occlusion and stenosis of bilateral carotid arteries: Secondary | ICD-10-CM

## 2018-08-13 LAB — T4, FREE: FREE T4: 0.74 ng/dL (ref 0.60–1.60)

## 2018-08-13 LAB — BASIC METABOLIC PANEL
BUN: 41 mg/dL — ABNORMAL HIGH (ref 6–23)
CALCIUM: 10.1 mg/dL (ref 8.4–10.5)
CHLORIDE: 105 meq/L (ref 96–112)
CO2: 26 mEq/L (ref 19–32)
CREATININE: 1.67 mg/dL — AB (ref 0.40–1.50)
GFR: 42.64 mL/min — ABNORMAL LOW (ref >60.00)
Glucose, Bld: 271 mg/dL — ABNORMAL HIGH (ref 70–99)
Potassium: 4.1 mEq/L (ref 3.5–5.1)
SODIUM: 137 meq/L (ref 135–145)

## 2018-08-13 LAB — IBC PANEL
IRON: 86 ug/dL (ref 42–165)
SATURATION RATIOS: 25.4 % (ref 20.0–50.0)
TRANSFERRIN: 242 mg/dL (ref 212.0–360.0)

## 2018-08-13 LAB — TSH: TSH: 2.37 u[IU]/mL (ref 0.35–4.50)

## 2018-08-13 NOTE — Progress Notes (Signed)
Subjective:    Patient ID: Henry [name]., male    DOB: 07/09/1941, 77 y.o.   MRN: [id number]  HPI Pt returns for f/u of diabetes mellitus: DM type: 1 Dx'ed: 1961 Complications: renal insufficiency, CAD, PAD, and retinopathy.   Therapy: insulin since dx.  DKA: never.   Severe hypoglycemia: last episode was 2016.   Pancreatitis: never.  Other: after poor results with multiple daily injections, he was changed to 70/30; he has declined pump and continuous glucose monitor; pt says he cannot afford insulin analogs.  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 Interval history: He brings a record of his cbg's which I have reviewed today.  It varies from 60-400.  There is no trend throughout the day.  Pt says he is completely unable to cite any reason for the variations.  pt states he feels well in general.   8 days ago, pt fell again in the shower, striking left chest wall against the side of the tub.  Since then, pain is improved.  He ihas been able to reduce hydrocodone to 1/2 pill q6h prn.    Past Medical History:  Diagnosis Date  . ANEMIA-NOS 07/10/2007  . CORONARY ARTERY DISEASE 07/10/2007  . DIABETES MELLITUS, TYPE I 07/10/2007  . HYPERLIPIDEMIA 07/10/2007  . HYPERTENSION 07/10/2007  . Peripheral arterial disease (HCC)   . 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 RENAL INSUFFICIENCY 07/10/2007    Past Surgical History:  Procedure Laterality Date  . APPENDECTOMY    . CARDIAC CATHETERIZATION     stenting in the vein graft to the right coronary artery and had_DES 3x 26 mm Integrity stent inserted, post dilated to 3.4 mm  . CAROTID ENDARTERECTOMY Right    right  . CHOLECYSTECTOMY N/A 09/13/2017   Procedure: LAPAROSCOPIC CHOLECYSTECTOMY;  Surgeon: Kinsinger, De Blanch, MD;  Location: [facility];  Service: General;  Laterality: N/A;  . CORONARY ARTERY BYPASS GRAFT  02/2010   PTCA of his right coroary  artery.   . ESOPHAGOGASTRODUODENOSCOPY N/A 09/11/2017   Procedure: ESOPHAGOGASTRODUODENOSCOPY (EGD);  Surgeon: Hilarie Fredrickson, MD;  Location: [facility];  Service: Endoscopy;  Laterality: N/A;    Social History   Socioeconomic History  . Marital status: Married    Spouse name: Not on file  . Number of children: Not on file  . Years of education: BS  . Highest education level: Not on file  Occupational History  . Occupation: retired    Comment: 14 yrs ago  Social Needs  . Financial resource strain: Not on file  . Food insecurity:    Worry: Not on file    Inability: Not on file  . Transportation needs:    Medical: Not on file    Non-medical: Not on file  Tobacco Use  . Smoking status: Former Games developer  . Smokeless tobacco: Never Used  Substance and Sexual Activity  . Alcohol use: No  . Drug use: No  . Sexual activity: Not on file  Lifestyle  . Physical activity:    Days per week: Not on file    Minutes per session: Not on file  . Stress: Not on file  Relationships  . Social connections:    Talks on phone: Not on file    Gets together: Not on file    Attends religious service: Not on file    Active member of club or organization: Not on file    Attends meetings of clubs  or organizations: Not on file    Relationship status: Not on file  . Intimate partner violence:    Fear of current or ex partner: Not on file    Emotionally abused: Not on file    Physically abused: Not on file    Forced sexual activity: Not on file  Other Topics Concern  . Not on file  Social History Narrative   Says his diet and exercise are very good   Married lives with wife    Current Outpatient Medications on File Prior to Visit  Medication Sig Dispense Refill  . acetaminophen (TYLENOL) 500 MG tablet Take 500 mg by mouth daily as needed (pain).    Marland Kitchen. aspirin EC 81 MG tablet Take 81 mg by mouth daily.    . B Complex-C-Folic Acid (FOLBEE PLUS) TABS Take 1 tablet by mouth daily.      .  Cholecalciferol (VITAMIN D) 2000 UNITS CAPS Take 2,000 Units by mouth daily.     . furosemide (LASIX) 20 MG tablet Take 20 mg by mouth daily.     Marland Kitchen. gabapentin (NEURONTIN) 100 MG capsule Take 100 mg by mouth at bedtime.     Marland Kitchen. HYDROcodone-acetaminophen (NORCO) 5-325 MG tablet Take 1-2 tablets by mouth every 6 (six) hours as needed. 20 tablet 0  . insulin NPH-regular Human (NOVOLIN 70/30) (70-30) 100 UNIT/ML injection 43 units with breakfast and 26 units with supper (Patient taking differently: Inject 26-40 Units into the skin See admin instructions. Inject 40 units subcutaneously before breakfast and 26 units before supper) 20 mL 11  . isosorbide mononitrate (IMDUR) 60 MG 24 hr tablet TAKE 1 TABLET BY MOUTH DAILY 30 tablet 9  . labetalol (NORMODYNE) 200 MG tablet Take 1 tablet (200 mg total) by mouth 2 (two) times daily. NEED OV. 60 tablet 0  . omeprazole (PRILOSEC OTC) 20 MG tablet Take 20 mg by mouth daily.     No current facility-administered medications on file prior to visit.     Allergies  Allergen Reactions  . Hydralazine Swelling    Feet swelled so bad they were cracking Severe foot swelling  . Penicillins Other (See Comments)    Per allergy test. (pt states he was never given penicillin)  . Amlodipine Besylate Swelling  . Atorvastatin     Muscle aches/pain.   . Codeine Other (See Comments)    anxiety  . Contrast Media [Iodinated Diagnostic Agents] Other (See Comments)    Kidney issues when given  . Lipitor [Atorvastatin Calcium] Other (See Comments)    myalgia  . Valsartan Other (See Comments) and Swelling    Caused kidney damage  . Zetia [Ezetimibe] Other (See Comments)    Severe dizziness    Family History  Problem Relation Age of Onset  . Heart disease Mother   . Cancer Neg Hx     BP (!) 170/62 (BP Location: Right Arm, Patient Position: Sitting, Cuff Size: Normal)   Pulse 66   Ht 5\' 10"  (1.778 m)   Wt 159 lb 9.6 oz (72.4 kg)   SpO2 97%   BMI 22.90 kg/m    Review of Systems He had lightheadedness, but no LOC.  He has slight tremor of the hands.      Objective:   Physical Exam VITAL SIGNS:  See vs page.  GENERAL: no distress.  LUNGS:  Clear to auscultation.    Lab Results  Component Value Date   HGBA1C 10.2 (H) 07/27/2018   Lab Results  Component Value Date  CREATININE 1.50 (H) 07/28/2018   BUN 22 07/28/2018   NA 135 07/28/2018   K 3.8 07/28/2018   CL 106 07/28/2018   CO2 20 (L) 07/28/2018   Lab Results  Component Value Date   WBC 7.9 07/27/2018   HGB 10.5 (L) 07/27/2018   HCT 31.1 (L) 07/27/2018   MCV 88.4 07/27/2018   PLT 224 07/27/2018    CXR: Negative for pneumothorax.  Streaky atelectasis at the left base. 2. Acute mildly displaced left tenth rib fracture.       Assessment & Plan:  HTN: with prob situational component.  Type 1 DM, with PAD: we discussed.  he again declines pump rx.   Hypothyroidism: recheck today Renal insuff: recheck today Anemia: check iron today  Patient Instructions  Please continue the same insulin.   blood tests are requested for you today.  We'll let you know about the results.   check your blood sugar 4 times a day: before the 3 meals, and at bedtime.  also check if you have symptoms of your blood sugar being too high or too low.  please keep a record of the readings and bring it to your next appointment here (or you can bring the meter itself).  You can write it on any piece of paper.  please call us sooner if your blood sugar goes below 70, or if you have a lot of readings over 200. Please see [name], for your wife to go over the insulin injections.   Please come back for a follow-up appointment in 3 months.

## 2018-08-13 NOTE — Patient Instructions (Addendum)
Please continue the same insulin.   blood tests are requested for you today.  We'll let you know about the results.   check your blood sugar 4 times a day: before the 3 meals, and at bedtime.  also check if you have symptoms of your blood sugar being too high or too low.  please keep a record of the readings and bring it to your next appointment here (or you can bring the meter itself).  You can write it on any piece of paper.  please call us sooner if your blood sugar goes below 70, or if you have a lot of readings over 200. Please see [name], for your wife to go over the insulin injections.   Please come back for a follow-up appointment in 3 months.

## 2018-08-14 DIAGNOSIS — E1065 Type 1 diabetes mellitus with hyperglycemia: Secondary | ICD-10-CM | POA: Diagnosis not present

## 2018-08-14 DIAGNOSIS — I129 Hypertensive chronic kidney disease with stage 1 through stage 4 chronic kidney disease, or unspecified chronic kidney disease: Secondary | ICD-10-CM | POA: Diagnosis not present

## 2018-08-14 DIAGNOSIS — I2510 Atherosclerotic heart disease of native coronary artery without angina pectoris: Secondary | ICD-10-CM | POA: Diagnosis not present

## 2018-08-14 DIAGNOSIS — R260 Ataxic gait: Secondary | ICD-10-CM | POA: Diagnosis not present

## 2018-08-14 DIAGNOSIS — E1122 Type 2 diabetes mellitus with diabetic chronic kidney disease: Secondary | ICD-10-CM | POA: Diagnosis not present

## 2018-08-14 DIAGNOSIS — N183 Chronic kidney disease, stage 3 (moderate): Secondary | ICD-10-CM | POA: Diagnosis not present

## 2018-08-15 DIAGNOSIS — E1065 Type 1 diabetes mellitus with hyperglycemia: Secondary | ICD-10-CM | POA: Diagnosis not present

## 2018-08-15 DIAGNOSIS — I2510 Atherosclerotic heart disease of native coronary artery without angina pectoris: Secondary | ICD-10-CM | POA: Diagnosis not present

## 2018-08-15 DIAGNOSIS — E1122 Type 2 diabetes mellitus with diabetic chronic kidney disease: Secondary | ICD-10-CM | POA: Diagnosis not present

## 2018-08-15 DIAGNOSIS — R260 Ataxic gait: Secondary | ICD-10-CM | POA: Diagnosis not present

## 2018-08-15 DIAGNOSIS — I129 Hypertensive chronic kidney disease with stage 1 through stage 4 chronic kidney disease, or unspecified chronic kidney disease: Secondary | ICD-10-CM | POA: Diagnosis not present

## 2018-08-15 DIAGNOSIS — N183 Chronic kidney disease, stage 3 (moderate): Secondary | ICD-10-CM | POA: Diagnosis not present

## 2018-08-16 ENCOUNTER — Telehealth: Payer: Self-pay | Admitting: Emergency Medicine

## 2018-08-16 DIAGNOSIS — N183 Chronic kidney disease, stage 3 (moderate): Secondary | ICD-10-CM | POA: Diagnosis not present

## 2018-08-16 DIAGNOSIS — E1065 Type 1 diabetes mellitus with hyperglycemia: Secondary | ICD-10-CM | POA: Diagnosis not present

## 2018-08-16 DIAGNOSIS — I2510 Atherosclerotic heart disease of native coronary artery without angina pectoris: Secondary | ICD-10-CM | POA: Diagnosis not present

## 2018-08-16 DIAGNOSIS — I129 Hypertensive chronic kidney disease with stage 1 through stage 4 chronic kidney disease, or unspecified chronic kidney disease: Secondary | ICD-10-CM | POA: Diagnosis not present

## 2018-08-16 DIAGNOSIS — R260 Ataxic gait: Secondary | ICD-10-CM | POA: Diagnosis not present

## 2018-08-16 DIAGNOSIS — E1122 Type 2 diabetes mellitus with diabetic chronic kidney disease: Secondary | ICD-10-CM | POA: Diagnosis not present

## 2018-08-16 NOTE — Telephone Encounter (Signed)
OK 

## 2018-08-16 NOTE — Telephone Encounter (Signed)
Please advise 

## 2018-08-16 NOTE — Telephone Encounter (Signed)
[facility] called and wants to know if they can get orders for Outpatient PT to focus on balance training. If so please have order faxed to [phone]. Thanks.

## 2018-08-18 ENCOUNTER — Ambulatory Visit
Admission: RE | Admit: 2018-08-18 | Discharge: 2018-08-18 | Disposition: A | Discharge status: Home / Self Care | Payer: Medicare Other | Source: Ambulatory Visit | Attending: Neurology | Admitting: Neurology

## 2018-08-18 DIAGNOSIS — R296 Repeated falls: Secondary | ICD-10-CM

## 2018-08-18 DIAGNOSIS — M542 Cervicalgia: Secondary | ICD-10-CM | POA: Diagnosis not present

## 2018-08-18 DIAGNOSIS — R292 Abnormal reflex: Secondary | ICD-10-CM

## 2018-08-19 DIAGNOSIS — I10 Essential (primary) hypertension: Secondary | ICD-10-CM | POA: Diagnosis not present

## 2018-08-19 DIAGNOSIS — E559 Vitamin D deficiency, unspecified: Secondary | ICD-10-CM | POA: Diagnosis not present

## 2018-08-19 DIAGNOSIS — N183 Chronic kidney disease, stage 3 (moderate): Secondary | ICD-10-CM | POA: Diagnosis not present

## 2018-08-19 DIAGNOSIS — N390 Urinary tract infection, site not specified: Secondary | ICD-10-CM | POA: Diagnosis not present

## 2018-08-20 NOTE — Telephone Encounter (Signed)
I LVM with Henry [name] that I could give her the orders verbally or she could fax us an order to sign. I stated that isn't something that Dr. Everardo AllEllison normally types up & faxes. Paperwork is usually sent to us.

## 2018-08-21 DIAGNOSIS — E1122 Type 2 diabetes mellitus with diabetic chronic kidney disease: Secondary | ICD-10-CM | POA: Diagnosis not present

## 2018-08-21 DIAGNOSIS — I2510 Atherosclerotic heart disease of native coronary artery without angina pectoris: Secondary | ICD-10-CM | POA: Diagnosis not present

## 2018-08-21 DIAGNOSIS — I129 Hypertensive chronic kidney disease with stage 1 through stage 4 chronic kidney disease, or unspecified chronic kidney disease: Secondary | ICD-10-CM | POA: Diagnosis not present

## 2018-08-21 DIAGNOSIS — E1065 Type 1 diabetes mellitus with hyperglycemia: Secondary | ICD-10-CM | POA: Diagnosis not present

## 2018-08-21 DIAGNOSIS — N183 Chronic kidney disease, stage 3 (moderate): Secondary | ICD-10-CM | POA: Diagnosis not present

## 2018-08-21 DIAGNOSIS — R260 Ataxic gait: Secondary | ICD-10-CM | POA: Diagnosis not present

## 2018-08-22 ENCOUNTER — Telehealth: Payer: Self-pay

## 2018-08-22 DIAGNOSIS — I2510 Atherosclerotic heart disease of native coronary artery without angina pectoris: Secondary | ICD-10-CM | POA: Diagnosis not present

## 2018-08-22 DIAGNOSIS — R296 Repeated falls: Secondary | ICD-10-CM

## 2018-08-22 DIAGNOSIS — I129 Hypertensive chronic kidney disease with stage 1 through stage 4 chronic kidney disease, or unspecified chronic kidney disease: Secondary | ICD-10-CM | POA: Diagnosis not present

## 2018-08-22 DIAGNOSIS — R260 Ataxic gait: Secondary | ICD-10-CM | POA: Diagnosis not present

## 2018-08-22 DIAGNOSIS — E1122 Type 2 diabetes mellitus with diabetic chronic kidney disease: Secondary | ICD-10-CM | POA: Diagnosis not present

## 2018-08-22 DIAGNOSIS — R292 Abnormal reflex: Secondary | ICD-10-CM

## 2018-08-22 DIAGNOSIS — E1065 Type 1 diabetes mellitus with hyperglycemia: Secondary | ICD-10-CM | POA: Diagnosis not present

## 2018-08-22 DIAGNOSIS — N183 Chronic kidney disease, stage 3 (moderate): Secondary | ICD-10-CM | POA: Diagnosis not present

## 2018-08-22 NOTE — Telephone Encounter (Signed)
-----   Message from Drema DallasAdam R Jaffe, DO sent at 08/20/2018 12:09 PM EDT ----- MRI of cervical spine does not reveal any cause for balance problems.  I would like to order MRI of thoracic spine to evaluate for Babinski and frequent falls

## 2018-08-22 NOTE — Telephone Encounter (Signed)
Called and spoke with Pt, advised of MRI results and T spine recommendation. Pt will wait to hear from [facility]

## 2018-08-29 ENCOUNTER — Telehealth: Payer: Self-pay | Admitting: Endocrinology

## 2018-08-29 DIAGNOSIS — R260 Ataxic gait: Secondary | ICD-10-CM | POA: Diagnosis not present

## 2018-08-29 DIAGNOSIS — E1122 Type 2 diabetes mellitus with diabetic chronic kidney disease: Secondary | ICD-10-CM | POA: Diagnosis not present

## 2018-08-29 DIAGNOSIS — S20212A Contusion of left front wall of thorax, initial encounter: Secondary | ICD-10-CM

## 2018-08-29 DIAGNOSIS — N183 Chronic kidney disease, stage 3 (moderate): Secondary | ICD-10-CM | POA: Diagnosis not present

## 2018-08-29 DIAGNOSIS — I2510 Atherosclerotic heart disease of native coronary artery without angina pectoris: Secondary | ICD-10-CM | POA: Diagnosis not present

## 2018-08-29 DIAGNOSIS — E1065 Type 1 diabetes mellitus with hyperglycemia: Secondary | ICD-10-CM | POA: Diagnosis not present

## 2018-08-29 DIAGNOSIS — I129 Hypertensive chronic kidney disease with stage 1 through stage 4 chronic kidney disease, or unspecified chronic kidney disease: Secondary | ICD-10-CM | POA: Diagnosis not present

## 2018-08-29 NOTE — Telephone Encounter (Signed)
OK 

## 2018-08-29 NOTE — Telephone Encounter (Signed)
Is ov needed?

## 2018-08-29 NOTE — Telephone Encounter (Signed)
Kelly-physical therapist with [facility] ph# [phone] called re: patient has been discharged from home health physical therapy-[facility] is recommending that patient starts outpatient physical therapy at [facility] -please send referral to fax# [phone] [place])

## 2018-08-30 ENCOUNTER — Ambulatory Visit: Payer: Medicare Other | Admitting: Cardiovascular Disease

## 2018-09-02 DIAGNOSIS — S20219A Contusion of unspecified front wall of thorax, initial encounter: Secondary | ICD-10-CM | POA: Insufficient documentation

## 2018-09-02 NOTE — Telephone Encounter (Signed)
please call patient: Do you want to continue to do phys therapy?

## 2018-09-02 NOTE — Telephone Encounter (Signed)
lft message with whomever answered pt phone and requested a return call

## 2018-09-02 NOTE — Telephone Encounter (Signed)
Pt stated yes he would like to continue

## 2018-09-02 NOTE — Telephone Encounter (Signed)
Please place referral or does pt need an ov first?

## 2018-09-02 NOTE — Telephone Encounter (Signed)
Ok, I did referral 

## 2018-09-03 NOTE — Telephone Encounter (Signed)
Pt informed

## 2018-09-04 DIAGNOSIS — E1122 Type 2 diabetes mellitus with diabetic chronic kidney disease: Secondary | ICD-10-CM | POA: Diagnosis not present

## 2018-09-04 DIAGNOSIS — I129 Hypertensive chronic kidney disease with stage 1 through stage 4 chronic kidney disease, or unspecified chronic kidney disease: Secondary | ICD-10-CM | POA: Diagnosis not present

## 2018-09-04 DIAGNOSIS — E1065 Type 1 diabetes mellitus with hyperglycemia: Secondary | ICD-10-CM | POA: Diagnosis not present

## 2018-09-04 DIAGNOSIS — I2510 Atherosclerotic heart disease of native coronary artery without angina pectoris: Secondary | ICD-10-CM | POA: Diagnosis not present

## 2018-09-04 DIAGNOSIS — R260 Ataxic gait: Secondary | ICD-10-CM | POA: Diagnosis not present

## 2018-09-04 DIAGNOSIS — N183 Chronic kidney disease, stage 3 (moderate): Secondary | ICD-10-CM | POA: Diagnosis not present

## 2018-09-05 DIAGNOSIS — Z961 Presence of intraocular lens: Secondary | ICD-10-CM | POA: Diagnosis not present

## 2018-09-05 DIAGNOSIS — E103593 Type 1 diabetes mellitus with proliferative diabetic retinopathy without macular edema, bilateral: Secondary | ICD-10-CM | POA: Diagnosis not present

## 2018-09-07 ENCOUNTER — Ambulatory Visit
Admission: RE | Admit: 2018-09-07 | Discharge: 2018-09-07 | Disposition: A | Discharge status: Home / Self Care | Payer: Medicare Other | Source: Ambulatory Visit | Attending: Neurology | Admitting: Neurology

## 2018-09-07 DIAGNOSIS — R296 Repeated falls: Secondary | ICD-10-CM

## 2018-09-07 DIAGNOSIS — M47814 Spondylosis without myelopathy or radiculopathy, thoracic region: Secondary | ICD-10-CM | POA: Diagnosis not present

## 2018-09-07 DIAGNOSIS — R292 Abnormal reflex: Secondary | ICD-10-CM

## 2018-09-10 ENCOUNTER — Ambulatory Visit: Payer: Medicare Other | Admitting: Cardiovascular Disease

## 2018-09-11 DIAGNOSIS — M1830 Unilateral post-traumatic osteoarthritis of first carpometacarpal joint, unspecified hand: Secondary | ICD-10-CM | POA: Diagnosis not present

## 2018-09-12 ENCOUNTER — Encounter

## 2018-09-12 ENCOUNTER — Ambulatory Visit: Payer: Medicare Other | Admitting: Neurology

## 2018-09-16 ENCOUNTER — Telehealth: Payer: Self-pay | Admitting: Neurology

## 2018-09-16 DIAGNOSIS — N183 Chronic kidney disease, stage 3 (moderate): Secondary | ICD-10-CM | POA: Diagnosis not present

## 2018-09-16 DIAGNOSIS — I10 Essential (primary) hypertension: Secondary | ICD-10-CM | POA: Diagnosis not present

## 2018-09-16 DIAGNOSIS — E559 Vitamin D deficiency, unspecified: Secondary | ICD-10-CM | POA: Diagnosis not present

## 2018-09-16 DIAGNOSIS — N390 Urinary tract infection, site not specified: Secondary | ICD-10-CM | POA: Diagnosis not present

## 2018-09-16 DIAGNOSIS — R809 Proteinuria, unspecified: Secondary | ICD-10-CM | POA: Diagnosis not present

## 2018-09-16 NOTE — Telephone Encounter (Signed)
Mychart message sent to patient.

## 2018-09-16 NOTE — Telephone Encounter (Signed)
-----   Message from Drema DallasAdam R Jaffe, DO sent at 09/09/2018  7:39 PM EDT ----- MRI of thoracic spine unremarkable.  I would like to check MRI of lumbar spine to evaluate for spinal stenosis.

## 2018-09-19 ENCOUNTER — Ambulatory Visit (INDEPENDENT_AMBULATORY_CARE_PROVIDER_SITE_OTHER): Payer: Medicare Other | Admitting: Endocrinology

## 2018-09-19 ENCOUNTER — Encounter: Payer: Self-pay | Admitting: Endocrinology

## 2018-09-19 VITALS — BP 124/72 | HR 69 | Ht 70.0 in | Wt 166.0 lb

## 2018-09-19 DIAGNOSIS — E1065 Type 1 diabetes mellitus with hyperglycemia: Secondary | ICD-10-CM

## 2018-09-19 DIAGNOSIS — I6523 Occlusion and stenosis of bilateral carotid arteries: Secondary | ICD-10-CM

## 2018-09-19 DIAGNOSIS — E1029 Type 1 diabetes mellitus with other diabetic kidney complication: Secondary | ICD-10-CM

## 2018-09-19 DIAGNOSIS — IMO0002 Reserved for concepts with insufficient information to code with codable children: Secondary | ICD-10-CM

## 2018-09-19 MED ORDER — INSULIN NPH ISOPHANE & REGULAR (70-30) 100 UNIT/ML ~~LOC~~ SUSP: SUBCUTANEOUS | 11 refills | Status: AC

## 2018-09-19 NOTE — Progress Notes (Signed)
Subjective:    Patient ID: Henry Desha., male    DOB: 1941/06/13, 77 y.o.   MRN: [id number]  HPI Pt returns for f/u of diabetes mellitus: DM type: 1 Dx'ed: 1961 Complications: renal insufficiency, CAD, PAD, and retinopathy.   Therapy: insulin since dx.  DKA: never.   Severe hypoglycemia: last episode was 2016.   Pancreatitis: never.  Other: after poor results with multiple daily injections, he was changed to 70/30; he has declined pump and continuous glucose monitor; pt says he cannot afford insulin analogs.  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   Interval history: He brings a record of his cbg's which I have reviewed today.  It varies from 63-400's.  There is no trend throughout the day.  Pt says he is again completely unable to cite any reason for the variations.  He has early am hypoglycemia approx once a week, which awakens him. pt states he feels well in general.   Past Medical History:  Diagnosis Date  . ANEMIA-NOS 07/10/2007  . CORONARY ARTERY DISEASE 07/10/2007  . DIABETES MELLITUS, TYPE I 07/10/2007  . HYPERLIPIDEMIA 07/10/2007  . HYPERTENSION 07/10/2007  . Peripheral arterial disease (HCC)   . 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 RENAL INSUFFICIENCY 07/10/2007    Past Surgical History:  Procedure Laterality Date  . APPENDECTOMY    . CARDIAC CATHETERIZATION     stenting in the vein graft to the right coronary artery and had_DES 3x 26 mm Integrity stent inserted, post dilated to 3.4 mm  . CAROTID ENDARTERECTOMY Right    right  . CHOLECYSTECTOMY N/A 09/13/2017   Procedure: LAPAROSCOPIC CHOLECYSTECTOMY;  Surgeon: Kinsinger, De Blanch, MD;  Location: [facility];  Service: General;  Laterality: N/A;  . CORONARY ARTERY BYPASS GRAFT  02/2010   PTCA of his right coroary artery.   . ESOPHAGOGASTRODUODENOSCOPY N/A 09/11/2017   Procedure: ESOPHAGOGASTRODUODENOSCOPY (EGD);  Surgeon: Hilarie Fredrickson, MD;  Location: [facility];  Service: Endoscopy;  Laterality: N/A;    Social History   Socioeconomic History  . Marital status: Married    Spouse name: Not on file  . Number of children: Not on file  . Years of education: BS  . Highest education level: Not on file  Occupational History  . Occupation: retired    Comment: 14 yrs ago  Social Needs  . Financial resource strain: Not on file  . Food insecurity:    Worry: Not on file    Inability: Not on file  . Transportation needs:    Medical: Not on file    Non-medical: Not on file  Tobacco Use  . Smoking status: Former Games developer  . Smokeless tobacco: Never Used  Substance and Sexual Activity  . Alcohol use: No  . Drug use: No  . Sexual activity: Not on file  Lifestyle  . Physical activity:    Days per week: Not on file    Minutes per session: Not on file  . Stress: Not on file  Relationships  . Social connections:    Talks on phone: Not on file    Gets together: Not on file    Attends religious service: Not on file    Active member of club or organization: Not on file    Attends meetings of clubs or organizations: Not on file    Relationship status: Not on file  . Intimate partner violence:    Fear of current or ex  partner: Not on file    Emotionally abused: Not on file    Physically abused: Not on file    Forced sexual activity: Not on file  Other Topics Concern  . Not on file  Social History Narrative   Says his diet and exercise are very good   Married lives with wife    Current Outpatient Medications on File Prior to Visit  Medication Sig Dispense Refill  . acetaminophen (TYLENOL) 500 MG tablet Take 500 mg by mouth daily as needed (pain).    Marland Kitchen amLODipine (NORVASC) 2.5 MG tablet Take 2.5 mg by mouth daily.     Marland Kitchen aspirin EC 81 MG tablet Take 81 mg by mouth daily.    . B Complex-C-Folic Acid (FOLBEE PLUS) TABS Take 1 tablet by mouth daily.      . Cholecalciferol (VITAMIN D) 2000 UNITS CAPS Take 2,000  Units by mouth daily.     . furosemide (LASIX) 20 MG tablet Take 20 mg by mouth daily.     Marland Kitchen gabapentin (NEURONTIN) 100 MG capsule Take 100 mg by mouth at bedtime.     . isosorbide mononitrate (IMDUR) 60 MG 24 hr tablet TAKE 1 TABLET BY MOUTH DAILY 30 tablet 9  . metoprolol succinate (TOPROL-XL) 25 MG 24 hr tablet 2 (two) times daily.    Marland Kitchen omeprazole (PRILOSEC OTC) 20 MG tablet Take 20 mg by mouth daily.     No current facility-administered medications on file prior to visit.     Allergies  Allergen Reactions  . Hydralazine Swelling    Feet swelled so bad they were cracking Severe foot swelling  . Penicillins Other (See Comments)    Per allergy test. (pt states he was never given penicillin)  . Amlodipine Besylate Swelling  . Atorvastatin     Muscle aches/pain.   . Codeine Other (See Comments)    anxiety  . Contrast Media [Iodinated Diagnostic Agents] Other (See Comments)    Kidney issues when given  . Lipitor [Atorvastatin Calcium] Other (See Comments)    myalgia  . Other Other (See Comments)    X ray dye, kidney problem  . Valsartan Other (See Comments) and Swelling    Caused kidney damage  . Zetia [Ezetimibe] Other (See Comments)    Severe dizziness    Family History  Problem Relation Age of Onset  . Heart disease Mother   . Cancer Neg Hx     BP 124/72   Pulse 69   Ht 5\' 10"  (1.778 m)   Wt 166 lb (75.3 kg)   SpO2 99%   BMI 23.82 kg/m     Review of Systems Denies LOC    Objective:   Physical Exam VITAL SIGNS:  See vs page.  GENERAL: no distress.  Pulses: dorsalis pedis absent bilat.  Skin: feet are of normal temp.  left leg is bandaged. There is patchy hyperpigmentation. There is an old healed vein harvest scar at the left leg.   Feet: no deformity. There is bilateral onychomycosis.   CV: no leg edema.   Neuro: sensation is intact to touch on the feet, but severely decreased from normal.     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   Patient Instructions  Please reduce the insulin to 41 units with breakfast, and 25 units with supper. check your blood sugar 4 times a day: before the 3 meals, and at bedtime.  also check if you have symptoms of your blood sugar being too high or too low.  please keep a record of the readings and bring it to your next appointment here (or you can bring the meter itself).  You can write it on any piece of paper.  please call us sooner if your blood sugar goes below 70, or if you have a lot of readings over 200. Please come back for a follow-up appointment in 4 months.

## 2018-09-19 NOTE — Patient Instructions (Signed)
Please reduce the insulin to 41 units with breakfast, and 25 units with supper. check your blood sugar 4 times a day: before the 3 meals, and at bedtime.  also check if you have symptoms of your blood sugar being too high or too low.  please keep a record of the readings and bring it to your next appointment here (or you can bring the meter itself).  You can write it on any piece of paper.  please call us sooner if your blood sugar goes below 70, or if you have a lot of readings over 200. Please come back for a follow-up appointment in 4 months.

## 2018-09-20 ENCOUNTER — Ambulatory Visit (INDEPENDENT_AMBULATORY_CARE_PROVIDER_SITE_OTHER): Payer: Medicare Other | Admitting: Podiatry

## 2018-09-20 DIAGNOSIS — E1151 Type 2 diabetes mellitus with diabetic peripheral angiopathy without gangrene: Secondary | ICD-10-CM | POA: Diagnosis not present

## 2018-09-20 DIAGNOSIS — B351 Tinea unguium: Secondary | ICD-10-CM

## 2018-09-20 DIAGNOSIS — Q828 Other specified congenital malformations of skin: Secondary | ICD-10-CM

## 2018-09-20 DIAGNOSIS — M79676 Pain in unspecified toe(s): Secondary | ICD-10-CM | POA: Diagnosis not present

## 2018-09-23 NOTE — Progress Notes (Signed)
Subjective: 77 y.o. returns the office today for painful, elongated, thickened toenails which he cannot trim himself.  Denies any redness or drainage around the nails.  Since last appointment he does state he has been falling is been having to use a cane.  He thinks this is coming from a change in his blood pressure medicine as well as his neuropathy.  He has been seen for this.  Denies any acute changes since last appointment and no new complaints today. Denies any systemic complaints such as fevers, chills, nausea, vomiting.   PCP: Romero Belling, MD  Last A1c was 10.2 07/27/2018  Objective: AAO 3, NAD DP/PT pulses 1/4, CRT less than 3 seconds Sensation decreased bilaterally. Nails hypertrophic, dystrophic, elongated, brittle, discolored 10. There is tenderness overlying the nails 1-5 bilaterally. There is no surrounding erythema or drainage along the nail sites. Minimal hyperkeratotic tissue present bilateral submetatarsal 5 without any underlying ulceration drainage or any signs of infection.  No open lesions or pre-ulcerative lesions are identified. No pain with calf compression, swelling, warmth, erythema.  Assessment: Patient presents with symptomatic onychomycosis, hyperkeratotic lesions  Plan: -Treatment options including alternatives, risks, complications were discussed -Nails sharply debrided 10 without complication/bleeding. -Hyperkeratotic lesion Sharpy debrided x2 without any complications or bleeding. -Discussed daily foot inspection. If there are any changes, to call the office immediately.  -Follow-up in 3 months or sooner if any problems are to arise. In the meantime, encouraged to call the office with any questions, concerns, changes symptoms.  Henry [name], DPM

## 2018-10-30 ENCOUNTER — Ambulatory Visit: Payer: Medicare Other | Admitting: Nutrition

## 2018-11-14 ENCOUNTER — Ambulatory Visit (INDEPENDENT_AMBULATORY_CARE_PROVIDER_SITE_OTHER): Payer: Medicare Other | Admitting: Cardiovascular Disease

## 2018-11-14 ENCOUNTER — Encounter: Payer: Self-pay | Admitting: Cardiovascular Disease

## 2018-11-14 VITALS — BP 142/70 | HR 84 | Ht 70.0 in | Wt 175.2 lb

## 2018-11-14 DIAGNOSIS — Z951 Presence of aortocoronary bypass graft: Secondary | ICD-10-CM | POA: Diagnosis not present

## 2018-11-14 DIAGNOSIS — Z79899 Other long term (current) drug therapy: Secondary | ICD-10-CM

## 2018-11-14 DIAGNOSIS — E785 Hyperlipidemia, unspecified: Secondary | ICD-10-CM | POA: Diagnosis not present

## 2018-11-14 DIAGNOSIS — R600 Localized edema: Secondary | ICD-10-CM

## 2018-11-14 DIAGNOSIS — I2510 Atherosclerotic heart disease of native coronary artery without angina pectoris: Secondary | ICD-10-CM | POA: Diagnosis not present

## 2018-11-14 DIAGNOSIS — Z9889 Other specified postprocedural states: Secondary | ICD-10-CM

## 2018-11-14 DIAGNOSIS — K219 Gastro-esophageal reflux disease without esophagitis: Secondary | ICD-10-CM | POA: Diagnosis not present

## 2018-11-14 DIAGNOSIS — E1021 Type 1 diabetes mellitus with diabetic nephropathy: Secondary | ICD-10-CM

## 2018-11-14 DIAGNOSIS — I739 Peripheral vascular disease, unspecified: Secondary | ICD-10-CM

## 2018-11-14 DIAGNOSIS — I10 Essential (primary) hypertension: Secondary | ICD-10-CM | POA: Diagnosis not present

## 2018-11-14 DIAGNOSIS — E1042 Type 1 diabetes mellitus with diabetic polyneuropathy: Secondary | ICD-10-CM | POA: Diagnosis not present

## 2018-11-14 DIAGNOSIS — I6523 Occlusion and stenosis of bilateral carotid arteries: Secondary | ICD-10-CM | POA: Diagnosis not present

## 2018-11-14 MED ORDER — FUROSEMIDE 40 MG PO TABS: 40.0000 mg | ORAL_TABLET | Freq: Every day | ORAL | 3 refills | Status: DC

## 2018-11-14 NOTE — Patient Instructions (Signed)
Medication Instructions:  INCREASE furosemide (Lasix) to 40 mg daily  If you need a refill on your cardiac medications before your next appointment, please call your pharmacy.   Lab work: Please return for FASTING labs in 1 week (CMET, Lipid, BNP)  Our in office lab hours are Monday-Friday 8:00-4:00, closed for lunch 12:45-1:45 pm.  No appointment needed.  If you have labs (blood work) drawn today and your tests are completely normal, you will receive your results only by: Marland Kitchen. MyChart Message (if you have MyChart) OR . A paper copy in the mail If you have any lab test that is abnormal or we need to change your treatment, we will call you to review the results.  Follow-Up: [facility], you and your health needs are our priority.  As part of our continuing mission to provide you with exceptional heart care, we have created designated Provider Care Teams.  These Care Teams include your primary Cardiologist (physician) and Advanced Practice Providers (APPs -  Physician Assistants and Nurse Practitioners) who all work together to provide you with the care you need, when you need it. You will need a follow up appointment in 4 months.  Please call our office 2 months in advance to schedule this appointment.  You may see Nicki Guadalajarahomas Kelly, MD or one of the following Advanced Practice Providers on your designated Care Team: ProctorHao Meng, [place]PA-C . Micah FlesherAngela Duke, PA-C

## 2018-11-14 NOTE — Progress Notes (Signed)
Patient ID: Henry [name]., male   DOB: 1941/02/24, 77 y.o.   MRN: [id number]      HPI: Henry [name]. is a 77 y.o. male who presents to the office today for a cardiology follow-up evaluation.  I last saw him in March 2018.  Henry [name] has a history of brittle type I diabetes. He has CAD and in April 1999 suffered an inferior wall myocardial infarction which initially was complicated by third-degree heart block in the setting of his MI. He underwent PTCA of his RCA. Due to the extensive CAD he ultimately underwent elective CABG surgery. He also has peripheral vascular disease and is status post right carotid endarterectomy. At catheterization March 2011 he was found to have 95% stenosis in the graft supplying his RCA and underwent successful stenting with a 3.0x26 mm integrity stent. A bare metal stent was used in light of his significant diabetic retinopathy with significant bleeding history to reduce his need for long-term dual antiplatelet therapy. Henry [name] has renal insufficiency and is followed by Dr. Dimas Aguas. Laboratory in 2013 showed a creatinine of 1.32.   Henry [name] has experienced several episodes of dizziness and has been felt to have an autonomic neuropathy. He denies frank syncope. Denies recurrent episodes of chest pain. He denies significant shortness of breath.  He tells me recently, his sugars have been very difficult to control. Recent hemoglobin A1c checked by Dr. Loanne Drilling was elevated at 11.4.Henry [name] seems to develop the beginning of right foot paresthesias but most likely her peripheral neuropathy.  In 2015 I recommended a titration of his labetolol for suboptimal blood pressure control, as well as ectopy.  He did have occasional PVCs, predominately unifocal.  However, he did have several episodes of 4 beats of ventricular tachycardia at a rate of 114 and then another episode of 7 beats of nonsustained ventricular tachycardia.   He has had issues with dizziness in the past  resulting in medication reduction.  He is felt to have a peripheral neuropathy and is now on gabapentin.  Remotely, he had developed significant leg edema with amlodipine.  He was unable to tolerate ACE inhibition or ARB therapy.  There also was a possibility of hydralazine-induced leg swelling back in 2011.  He has peripheral vascular disease and is status post right carotid endarterectomy.  Since I last saw him, he was diagnosed with significant PVD with an occluded anterior tibial and posterior tibial arteries on the right, early high-grade left SFA stenosis with an occluded left breast posterior tibial artery.  He has had chronic claudication symptoms but these do not appear to be significantly progressing. He has seen Dr. Gwenlyn Found for PV evaluation and remotely was told that no further therapy with regards intervention was warranted.  I saw him in March 2018 time he denied any episodes of chest tightness experiencing some occasional episodes of dyspepsia.  He has continued to be followed by Dr. Renato Shin for his diabetes and Dr.Igwemezie for his renal issues.  Over the last year and a half, he denies any issues with chest tightness.  He has had episodes of leg edema.  He saw Almyra Deforest, [place] in February 2019 and had complaints of dyspnea on exertion.  He underwent an echo Doppler study in March 2019 which showed an EF of 50 to 55% with mild LVH.  There was grade 2 diastolic dysfunction.  There was mild increased PA pressure at 44 mm.  There was mitral annular calcification with mild MR.  He underwent a nuclear perfusion study on March 08, 2018 which showed an EF of 46% with a fixed medium-sized basal inferoseptal/inferior and mid inferior defect suggestive of his prior infarction.  There was no ischemia.  He has peripheral vascular disease and was also evaluated by Dr. Alvester Chou in May 2019.  ABIs revealed right ABI of 1.18 and left of 0.73.  There was a high-frequency signal in his mid left SFA with 1 vessel  runoff.  With his diabetes and renal insufficiency medical therapy was recommended.  He states that time she has had sudden falls due to imbalance resulting from his diabetic nephropathy.  He has been intolerant to statins as well as Zetia.  He presents for evaluation   Past Medical History:  Diagnosis Date  . ANEMIA-NOS 07/10/2007  . CORONARY ARTERY DISEASE 07/10/2007  . DIABETES MELLITUS, TYPE I 07/10/2007  . HYPERLIPIDEMIA 07/10/2007  . HYPERTENSION 07/10/2007  . Peripheral arterial disease ([place])   . 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 RENAL INSUFFICIENCY 07/10/2007    Past Surgical History:  Procedure Laterality Date  . APPENDECTOMY    . CARDIAC CATHETERIZATION     stenting in the vein graft to the right coronary artery and had_DES 3x 26 mm Integrity stent inserted, post dilated to 3.4 mm  . CAROTID ENDARTERECTOMY Right    right  . CHOLECYSTECTOMY N/A 09/13/2017   Procedure: LAPAROSCOPIC CHOLECYSTECTOMY;  Surgeon: Kinsinger, Arta Bruce, MD;  Location: [facility];  Service: General;  Laterality: N/A;  . CORONARY ARTERY BYPASS GRAFT  02/2010   PTCA of his right coroary artery.   . ESOPHAGOGASTRODUODENOSCOPY N/A 09/11/2017   Procedure: ESOPHAGOGASTRODUODENOSCOPY (EGD);  Surgeon: Irene Shipper, MD;  Location: [facility];  Service: Endoscopy;  Laterality: N/A;    Allergies  Allergen Reactions  . Hydralazine Swelling    Feet swelled so bad they were cracking Severe foot swelling  . Penicillins Other (See Comments)    Per allergy test. (pt states he was never given penicillin)  . Amlodipine Besylate Swelling  . Atorvastatin     Muscle aches/pain.   . Codeine Other (See Comments)    anxiety  . Contrast Media [Iodinated Diagnostic Agents] Other (See Comments)    Kidney issues when given  . Lipitor [Atorvastatin Calcium] Other (See Comments)    myalgia  . Other Other (See Comments)    X ray dye, kidney problem  . Valsartan Other (See Comments) and Swelling     Caused kidney damage  . Zetia [Ezetimibe] Other (See Comments)    Severe dizziness    Current Outpatient Medications  Medication Sig Dispense Refill  . acetaminophen (TYLENOL) 500 MG tablet Take 500 mg by mouth daily as needed (pain).    Marland Kitchen amLODipine (NORVASC) 2.5 MG tablet Take 2.5 mg by mouth daily.     Marland Kitchen aspirin EC 81 MG tablet Take 81 mg by mouth daily.    . B Complex-C-Folic Acid (FOLBEE PLUS) TABS Take 1 tablet by mouth daily.      . Cholecalciferol (VITAMIN D) 2000 UNITS CAPS Take 2,000 Units by mouth daily.     . furosemide (LASIX) 40 MG tablet Take 1 tablet (40 mg total) by mouth daily. 90 tablet 3  . gabapentin (NEURONTIN) 100 MG capsule Take 100 mg by mouth at bedtime.     . insulin NPH-regular Human (NOVOLIN 70/30) (70-30) 100 UNIT/ML injection 41 units with breakfast and 25 units with supper 20 mL 11  . isosorbide mononitrate (IMDUR) 60 MG 24 hr  tablet TAKE 1 TABLET BY MOUTH DAILY 30 tablet 9  . metoprolol succinate (TOPROL-XL) 25 MG 24 hr tablet 2 (two) times daily.    Marland Kitchen omeprazole (PRILOSEC OTC) 20 MG tablet Take 20 mg by mouth daily.     No current facility-administered medications for this visit.     Socially he is married has 2 children and 2 grandchildren. No tobacco history or alcohol use. He is not routinely exercise.  ROS General: Negative; No fevers, chills, or night sweats;  HEENT: He is status post multiple laser procedures for diabetic retinopathy., sinus congestion, difficulty swallowing Pulmonary: Negative; No cough, wheezing, shortness of breath, hemoptysis Cardiovascular: See HPI Positive for claudication symptoms  GI: Negative; No nausea, vomiting, diarrhea, or abdominal pain GU: Positive for mild renal insufficiency; No dysuria, hematuria, or difficulty voiding Musculoskeletal: Negative; no myalgias, joint pain, or weakness Hematologic/Oncology: Negative; no easy bruising, bleeding Endocrine: Positive for brittle diabetes no heat/cold intolerance;    Neuro: Positive for neuropathy with paresthesias; no changes in balance, headaches Skin: Negative; No rashes or skin lesions Psychiatric: Negative; No behavioral problems, depression Sleep: Negative; No snoring, daytime sleepiness, hypersomnolence, bruxism, restless legs, hypnogognic hallucinations, no cataplexy Other comprehensive 14 point system review is negative.   PE BP (!) 142/70   Pulse 84   Ht '5\' 10"'$  (1.778 m)   Wt 175 lb 3.2 oz (79.5 kg)   BMI 25.14 kg/m     Repeat blood pressure by me was 124/70 supine and 122/70 standing  Wt Readings from Last 3 Encounters:  11/14/18 175 lb 3.2 oz (79.5 kg)  09/19/18 166 lb (75.3 kg)  08/13/18 159 lb 9.6 oz (72.4 kg)   General: Alert, oriented, no distress.  Skin: normal turgor, no rashes, warm and dry HEENT: Normocephalic, atraumatic. Pupils equal round and reactive to light; sclera anicteric; extraocular muscles intact; he has a history of diabetic retinopathy Nose without nasal septal hypertrophy Mouth/Parynx benign; Mallinpatti scale 3 Neck: No JVD, no carotid bruits; normal carotid upstroke Lungs: clear to ausculatation and percussion; no wheezing or rales Chest wall: without tenderness to palpitation Heart: PMI not displaced, RRR, s1 s2 normal, 1/6 systolic murmur, no diastolic murmur, no rubs, gallops, thrills, or heaves Abdomen: soft, nontender; no hepatosplenomehaly, BS+; abdominal aorta nontender and not dilated by palpation. Back: no CVA tenderness Pulses 2+ Musculoskeletal: full range of motion, normal strength, no joint deformities Extremities: 1-2+ pitting edema bilaterally; no clubbing cyanosis, Homan's sign negative  Neurologic: grossly nonfocal; Cranial nerves grossly wnl Psychologic: Normal mood and affect  ECG (independently read by me): NSR ay 84; Inferolateral STT changes  March 2018 ECG (independently read by me): Normal sinus rhythm at 71 bpm.  Lateral ST-T changes.  May 2018 ECG (independently read by  me): Normal sinus rhythm at 64 bpm.  Inferolateral ST abnormality.  October 2015 ECG (independently read by me): Normal sinus rhythm at 63 beats per minute.  Nonspecific ST changes.  Prior ECG (independently read by me): Sinus rhythm at 59 beats per minute.  Nonspecific ST changes.  Prior 02/04/2014 ECG  (independently read by me): Sinus rhythm at 68 beats per minute with frequent PVCs unifocal.  Prior ECG of July 2014: Sinus rhythm at 64 beats per minute. QTc interval 400 ms. One isolated PAC. Small nondiagnostic Q waves inferiorly with preserved R waves.  LABS:  BMP Latest Ref Rng & Units 08/13/2018 07/28/2018 07/27/2018  Glucose 70 - 99 mg/dL 271(H) 241(H) 175(H)  BUN 6 - 23 mg/dL 41(H) 22 24(H)  Creatinine 0.40 - 1.50 mg/dL 1.67(H) 1.50(H) 1.62(H)  Sodium 135 - 145 mEq/L 137 135 139  Potassium 3.5 - 5.1 mEq/L 4.1 3.8 3.1(L)  Chloride 96 - 112 mEq/L 105 106 110  CO2 19 - 32 mEq/L 26 20(L) 22  Calcium 8.4 - 10.5 mg/dL 10.1 8.7(L) 8.8(L)   Hepatic Function Latest Ref Rng & Units 07/27/2018 07/26/2018 09/12/2017  Total Protein 6.5 - 8.1 g/dL 6.1(L) 7.0 5.8(L)  Albumin 3.5 - 5.0 g/dL 3.1(L) 3.7 2.9(L)  AST 15 - 41 U/L '21 23 23  '$ ALT 0 - 44 U/L '20 21 18  '$ Alk Phosphatase 38 - 126 U/L 75 83 73  Total Bilirubin 0.3 - 1.2 mg/dL 0.7 0.6 0.8  Bilirubin, Direct 0.0 - 0.3 mg/dL - - -   CBC Latest Ref Rng & Units 07/27/2018 07/26/2018 09/15/2017  WBC 4.0 - 10.5 K/uL 7.9 5.9 11.4(H)  Hemoglobin 13.0 - 17.0 g/dL 10.5(L) 11.0(L) 8.9(L)  Hematocrit 39.0 - 52.0 % 31.1(L) 32.1(L) 26.0(L)  Platelets 150 - 400 K/uL 224 226 260   Lab Results  Component Value Date   MCV 88.4 07/27/2018   MCV 88.9 07/26/2018   MCV 88.4 09/15/2017    Lab Results  Component Value Date   TSH 2.37 08/13/2018   Lab Results  Component Value Date   HGBA1C 10.2 (H) 07/27/2018   Lipid Panel     Component Value Date/Time   CHOL 163 07/27/2018 0459   CHOL 159 04/29/2018 1456   TRIG 124 07/27/2018 0459   HDL 30 (L)  07/27/2018 0459   HDL 48 04/29/2018 1456   CHOLHDL 5.4 07/27/2018 0459   VLDL 25 07/27/2018 0459   LDLCALC 108 (H) 07/27/2018 0459   LDLCALC 100 (H) 04/29/2018 1456     RADIOLOGY: No results found.  IMPRESSION:  1. Atherosclerosis of native coronary artery of native heart without angina pectoris   2. Hx of CABG   3. Hyperlipidemia with target LDL less than 70   4. Essential hypertension   5. Lower leg edema   6. Medication management   7. PAD (peripheral artery disease) ([place])   8. Claudication in peripheral vascular disease ([place])   9. H/O carotid endarterectomy   10. Gastroesophageal reflux disease without esophagitis   11. Diabetic polyneuropathy associated with type 1 diabetes mellitus ([place])   12. Diabetic nephropathy associated with type 1 diabetes mellitus ([place])     ASSESSMENT AND PLAN: Mr. Capistran Is a 77 year old Caucasian male who has a history of type 1 diabetes mellitus which has been brittle over the years and has documented nephropathy and retinopathy.  He has significant cardiovascular issues and suffered inferior wall MI, complicated by third-degree heart block in 1999 which was treated successfully with PTCA with resultant significant myocardial salvage. Due to concomitant CAD he underwent CABG surgery with a LIMA to his LAD, vein to diagonal, sequential vein to OM1 and distal circumflex, and SVG to his right coronary artery. He status post stenting of the proximal portion of the vein graft to his right coronary artery in 2011. An  echo Doppler study in February 2014 showed an ejection fraction of 50-55%; mitral annular calcification with mild MR. His right ventricle was dilated. He did have mild TR and mild MR. 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   A follow-up echo Doppler study in June 2017 which showed an EF of 50% with basal to mid inferior inferoseptal hypokinesis.  There was grade 2 diastolic dysfunction.  There  was bileaflet mitral  valve prolapse with moderate MR and mitral annular calcification.  His left atrium was mildly dilated.  His PA pressure was mildly increased at 41 mm. I reviewed his most recent echo Doppler study from March 2019 which was not significantly changed and showed an EF of 50 to 55%.  He has peripheral vascular disease and is status post right carotid endarterectomy.  He also has claudication symptomatology bilaterally in his lower extremity with documented significant occlusion of both the anterior tibial and posterior tibial arteries on the right and has left SFA stenosis with occluded left posterior tibial artery on the left.  I reviewed his recent evaluation with Dr. Gwenlyn Found and his claudication appears stable for which medical therapy is continued to be recommended he underwent  A follow-up nuclear perfusion study in June 2017, which continue to be low risk and showed basal inferoseptal and mid inferoseptal scar without associated ischemia.  EF was 52%.  In March 2019 his most recent nuclear perfusion study did not reveal any major changes although EF on that study was 46% which was lower than on the echocardiographic evaluation.  His most recent carotid study in April 2019 was unchanged from his previous evaluation and carotid duplex  showed heterogeneous plaque bilaterally with stable 1 a 39% bilateral ICA stenoses and he was status post right carotid endarterectomy.  On exam today, his blood pressure is stable without orthostatic change.  However, he has 1-2+ bilateral pitting edema which is new.  He has been taking amlodipine at just 2.5 mg, furosemide 20 mg, isosorbide 60 mg, and Toprol XL 25 mg twice a day.  I have suggested he increase furosemide to 40 mg daily as long as there is significant edema.  I also have recommended support stockings.  He has been intolerant to statins in addition to Zetia.  In August 2019 LDL cholesterol was 108.  I am recommending repeat chemistry lipid panel as well as a BNP for  further evaluation.  If his LDL remains elevated greater than 70 I have recommended consideration for PCSK9 inhibition.  He will return to see his renal MD next month.  He continues to take omeprazole for GERD.  I will see him in 4 months for reevaluation.  Time spent: 30 minutes  Troy Sine, MD, [facility]  11/16/2018 10:59 AM

## 2018-11-16 ENCOUNTER — Encounter: Payer: Self-pay | Admitting: Cardiovascular Disease

## 2018-11-25 ENCOUNTER — Ambulatory Visit (INDEPENDENT_AMBULATORY_CARE_PROVIDER_SITE_OTHER): Payer: Medicare Other | Admitting: Podiatry

## 2018-11-25 ENCOUNTER — Encounter: Payer: Self-pay | Admitting: Podiatry

## 2018-11-25 DIAGNOSIS — E1151 Type 2 diabetes mellitus with diabetic peripheral angiopathy without gangrene: Secondary | ICD-10-CM | POA: Diagnosis not present

## 2018-11-25 DIAGNOSIS — B351 Tinea unguium: Secondary | ICD-10-CM | POA: Diagnosis not present

## 2018-11-25 DIAGNOSIS — Q828 Other specified congenital malformations of skin: Secondary | ICD-10-CM | POA: Diagnosis not present

## 2018-11-25 DIAGNOSIS — M79676 Pain in unspecified toe(s): Secondary | ICD-10-CM | POA: Diagnosis not present

## 2018-11-27 ENCOUNTER — Encounter: Payer: Self-pay | Admitting: Family Medicine

## 2018-11-27 ENCOUNTER — Ambulatory Visit (INDEPENDENT_AMBULATORY_CARE_PROVIDER_SITE_OTHER): Payer: Medicare Other | Admitting: Family Medicine

## 2018-11-27 VITALS — BP 118/82 | HR 82 | Temp 97.8°F | Ht 70.0 in | Wt 167.6 lb

## 2018-11-27 DIAGNOSIS — I2510 Atherosclerotic heart disease of native coronary artery without angina pectoris: Secondary | ICD-10-CM

## 2018-11-27 DIAGNOSIS — Z7689 Persons encountering health services in other specified circumstances: Secondary | ICD-10-CM

## 2018-11-27 DIAGNOSIS — E7800 Pure hypercholesterolemia, unspecified: Secondary | ICD-10-CM | POA: Diagnosis not present

## 2018-11-27 DIAGNOSIS — I10 Essential (primary) hypertension: Secondary | ICD-10-CM | POA: Diagnosis not present

## 2018-11-27 DIAGNOSIS — E1029 Type 1 diabetes mellitus with other diabetic kidney complication: Secondary | ICD-10-CM

## 2018-11-27 DIAGNOSIS — E1065 Type 1 diabetes mellitus with hyperglycemia: Secondary | ICD-10-CM

## 2018-11-27 DIAGNOSIS — N183 Chronic kidney disease, stage 3 unspecified: Secondary | ICD-10-CM

## 2018-11-27 DIAGNOSIS — IMO0002 Reserved for concepts with insufficient information to code with codable children: Secondary | ICD-10-CM

## 2018-11-27 NOTE — Progress Notes (Addendum)
Subjective: 77 y.o. returns the office today for painful, elongated, thickened toenails which he cannot trim himself.  Denies any redness or drainage around the nails. Denies any acute changes since last appointment and no new complaints today. Denies any systemic complaints such as fevers, chills, nausea, vomiting.   PCP: Romero BellingEllison, Sean, MD  Last A1c was 10.2 07/27/2018  Objective: AAO 3, NAD DP/PT pulses 1/4, CRT less than 3 seconds Sensation decreased bilaterally. Nails hypertrophic, dystrophic, elongated, brittle, discolored 10. There is tenderness overlying the nails 1-5 bilaterally. There is no surrounding erythema or drainage along the nail sites. Minimal hyperkeratotic tissue present bilateral submetatarsal 5 without any underlying ulceration drainage or any signs of infection.  No open lesions or pre-ulcerative lesions are identified. Superficial wound present to the anterior aspect the left leg.  Faint rim of erythema but no ascending cellulitis there is no edema, erythema, drainage of pus or any other signs of infection noted today. No pain with calf compression, swelling, warmth, erythema.  Assessment: Patient presents with symptomatic onychomycosis, hyperkeratotic lesions; left leg wound  Plan: -Treatment options including alternatives, risks, complications were discussed -Nails sharply debrided 10 without complication/bleeding. -Hyperkeratotic lesion Sharpy debrided x2 without any complications or bleeding. -Went to antibiotic ointment dressing changes to the wound of the left leg daily.  He with bandage intact.  Monitoring signs or symptoms of infection. -Discussed daily foot inspection. If there are any changes, to call the office immediately.  -Follow-up in 3 months or sooner if any problems are to arise. In the meantime, encouraged to call the office with any questions, concerns, changes symptoms.  Ovid CurdMatthew Wagoner, DPM

## 2018-11-27 NOTE — Progress Notes (Signed)
Henry [name]. is a 77 y.o. male  Chief Complaint  Patient presents with  . Establish Care    est. care / transfer of care . h/o high blood pressure & severe diabetic    HPI: Henry [name]. is a 77 y.o. male here to establish care with our office. His endocrinologist Dr. Everardo All has also been his PCP for years. Pt will continue to see Dr. Everardo All for management of his Type I DM. He has complex medical issues including Type 1 DM, HTN, CAD, PAD, CKD stage 3, peripheral neuropathy, hyperlipidemia.  He is married with 2 grown daughters and 2 grandsons.   Specialists: endocrinology (Dr. Everardo All), ophthalmology, cardio (Dr. Tresa Endo), nephro, podiatry (Dr. Ardelle Anton)  Last PAP:n/a Last mammo: n/a Last Dexa: n/a Last colonoscopy: EGD (08/2017), [facility] (3 years ago) and does not want to do this or any CRC screening again  Med refills needed today: none   Past Medical History:  Diagnosis Date  . ANEMIA-NOS 07/10/2007  . CORONARY ARTERY DISEASE 07/10/2007  . DIABETES MELLITUS, TYPE I 07/10/2007  . HYPERLIPIDEMIA 07/10/2007  . HYPERTENSION 07/10/2007  . Peripheral arterial disease (HCC)   . 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 RENAL INSUFFICIENCY 07/10/2007    Past Surgical History:  Procedure Laterality Date  . APPENDECTOMY    . CARDIAC CATHETERIZATION     stenting in the vein graft to the right coronary artery and had_DES 3x 26 mm Integrity stent inserted, post dilated to 3.4 mm  . CAROTID ENDARTERECTOMY Right    right  . CHOLECYSTECTOMY N/A 09/13/2017   Procedure: LAPAROSCOPIC CHOLECYSTECTOMY;  Surgeon: Kinsinger, De Blanch, MD;  Location: [facility];  Service: General;  Laterality: N/A;  . CORONARY ARTERY BYPASS GRAFT  02/2010   PTCA of his right coroary artery.   . ESOPHAGOGASTRODUODENOSCOPY N/A 09/11/2017   Procedure: ESOPHAGOGASTRODUODENOSCOPY (EGD);  Surgeon: Hilarie Fredrickson, MD;  Location: [facility];  Service: Endoscopy;  Laterality: N/A;    Social History    Socioeconomic History  . Marital status: Married    Spouse name: Not on file  . Number of children: Not on file  . Years of education: BS  . Highest education level: Not on file  Occupational History  . Occupation: retired    Comment: 14 yrs ago  Social Needs  . Financial resource strain: Not on file  . Food insecurity:    Worry: Not on file    Inability: Not on file  . Transportation needs:    Medical: Not on file    Non-medical: Not on file  Tobacco Use  . Smoking status: Former Games developer  . Smokeless tobacco: Never Used  Substance and Sexual Activity  . Alcohol use: No  . Drug use: No  . Sexual activity: Not on file  Lifestyle  . Physical activity:    Days per week: Not on file    Minutes per session: Not on file  . Stress: Not on file  Relationships  . Social connections:    Talks on phone: Not on file    Gets together: Not on file    Attends religious service: Not on file    Active member of club or organization: Not on file    Attends meetings of clubs or organizations: Not on file    Relationship status: Not on file  . Intimate partner violence:    Fear of current or ex partner: Not on file    Emotionally abused: Not on file  Physically abused: Not on file    Forced sexual activity: Not on file  Other Topics Concern  . Not on file  Social History Narrative   Says his diet and exercise are very good   Married lives with wife    Family History  Problem Relation Age of Onset  . Heart disease Mother   . Cancer Neg Hx      Immunization History  Administered Date(s) Administered  . Pneumococcal Conjugate-13 08/27/2014, 12/05/2017  . Pneumococcal Polysaccharide-23 06/09/2004  . Td 06/09/2004    Outpatient Encounter Medications as of 11/27/2018  Medication Sig  . acetaminophen (TYLENOL) 500 MG tablet Take 500 mg by mouth daily as needed (pain).  Marland Kitchen. aspirin EC 81 MG tablet Take 81 mg by mouth daily.  . B Complex-C-Folic Acid (FOLBEE PLUS) TABS Take 1  tablet by mouth daily.    . Cholecalciferol (VITAMIN D) 2000 UNITS CAPS Take 2,000 Units by mouth daily.   . furosemide (LASIX) 40 MG tablet Take 1 tablet (40 mg total) by mouth daily.  Marland Kitchen. gabapentin (NEURONTIN) 100 MG capsule Take 100 mg by mouth at bedtime.   . insulin NPH-regular Human (NOVOLIN 70/30) (70-30) 100 UNIT/ML injection 41 units with breakfast and 25 units with supper  . isosorbide mononitrate (IMDUR) 60 MG 24 hr tablet TAKE 1 TABLET BY MOUTH DAILY  . metoprolol succinate (TOPROL-XL) 25 MG 24 hr tablet 2 (two) times daily.  Marland Kitchen. omeprazole (PRILOSEC OTC) 20 MG tablet Take 20 mg by mouth daily.  Marland Kitchen. amLODipine (NORVASC) 2.5 MG tablet Take 2.5 mg by mouth daily.    No facility-administered encounter medications on file as of 11/27/2018.      ROS: Gen: no fever, chills  Skin: no rash, itching ENT: no ear pain, ear drainage, nasal congestion, rhinorrhea, sinus pressure, sore throat Eyes: no blurry vision, double vision Resp: no cough, wheeze,SOB CV: no CP, palpitations; + LE edema GI: no heartburn, n/v/d/c, abd pain GU: no dysuria, urgency, frequency, hematuria  MSK: no joint pain, myalgias, back pain Neuro: no headache, weakness; + occasional dizziness/lightheadedness Psych: no depression, anxiety, insomnia   Allergies  Allergen Reactions  . Hydralazine Swelling    Feet swelled so bad they were cracking Severe foot swelling  . Penicillins Other (See Comments)    Per allergy test. (pt states he was never given penicillin)  . Amlodipine Besylate Swelling  . Atorvastatin     Muscle aches/pain.   . Codeine Other (See Comments)    anxiety  . Contrast Media [Iodinated Diagnostic Agents] Other (See Comments)    Kidney issues when given  . Lipitor [Atorvastatin Calcium] Other (See Comments)    myalgia  . Other Other (See Comments)    X ray dye, kidney problem  . Valsartan Other (See Comments) and Swelling    Caused kidney damage  . Zetia [Ezetimibe] Other (See Comments)     Severe dizziness    BP 118/82 (BP Location: Left Arm, Patient Position: Sitting, Cuff Size: Normal)   Pulse 82   Temp 97.8 F (36.6 C) (Oral)   Ht 5\' 10"  (1.778 m)   Wt 167 lb 9.6 oz (76 kg)   SpO2 94%   BMI 24.05 kg/m  Wt Readings from Last 3 Encounters:  11/27/18 167 lb 9.6 oz (76 kg)  11/14/18 175 lb 3.2 oz (79.5 kg)  09/19/18 166 lb (75.3 kg)   BP Readings from Last 3 Encounters:  11/27/18 118/82  11/14/18 (!) 142/70  09/19/18 124/72  Physical Exam  Constitutional: He is oriented to person, place, and time. He appears well-developed. He is cooperative. No distress.  Neck: Neck supple. No JVD present. No thyromegaly present.  Cardiovascular: Normal rate and regular rhythm.  No murmur heard. Pulmonary/Chest: Breath sounds normal. He has no wheezes. He has no rhonchi.  Abdominal: Soft. Bowel sounds are normal. He exhibits no distension. There is no tenderness.  Musculoskeletal: Normal range of motion. He exhibits edema (B/L LE pitting edema).  Lymphadenopathy:    He has no cervical adenopathy.  Neurological: He is alert and oriented to person, place, and time.  Psychiatric: He has a normal mood and affect. His behavior is normal.     A/P:  1. Encounter to establish care with new doctor - f/u q6-12 mo or PRN  2. Essential hypertension - at goal today - cont current meds and regular f/u with cardio as scheduled  - CMP ordered  3. Atherosclerosis of native coronary artery of native heart without angina pectoris - stable - cont current meds and regular f/u with cardio as scheduled   4. 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 Everardo All - cont current regimen and regular f/u  5. CKD (chronic kidney disease), stage III (HCC) - nephro appt tomorrow  6. Pure hypercholesterolemia - lipid panel ordered - pt unable to tolerate statin

## 2018-11-28 DIAGNOSIS — E559 Vitamin D deficiency, unspecified: Secondary | ICD-10-CM | POA: Diagnosis not present

## 2018-11-28 DIAGNOSIS — I10 Essential (primary) hypertension: Secondary | ICD-10-CM | POA: Diagnosis not present

## 2018-11-28 DIAGNOSIS — N390 Urinary tract infection, site not specified: Secondary | ICD-10-CM | POA: Diagnosis not present

## 2018-11-28 DIAGNOSIS — N183 Chronic kidney disease, stage 3 (moderate): Secondary | ICD-10-CM | POA: Diagnosis not present

## 2018-11-29 ENCOUNTER — Telehealth: Payer: Self-pay | Admitting: Physician Assistant

## 2018-11-29 ENCOUNTER — Other Ambulatory Visit (HOSPITAL_COMMUNITY): Payer: Self-pay | Admitting: Internal Medicine

## 2018-11-29 DIAGNOSIS — Z79899 Other long term (current) drug therapy: Secondary | ICD-10-CM | POA: Diagnosis not present

## 2018-11-29 DIAGNOSIS — I10 Essential (primary) hypertension: Secondary | ICD-10-CM

## 2018-11-29 DIAGNOSIS — I110 Hypertensive heart disease with heart failure: Secondary | ICD-10-CM | POA: Diagnosis not present

## 2018-11-29 DIAGNOSIS — I2510 Atherosclerotic heart disease of native coronary artery without angina pectoris: Secondary | ICD-10-CM | POA: Diagnosis not present

## 2018-11-29 DIAGNOSIS — N183 Chronic kidney disease, stage 3 (moderate): Secondary | ICD-10-CM | POA: Diagnosis not present

## 2018-11-29 DIAGNOSIS — R600 Localized edema: Secondary | ICD-10-CM | POA: Diagnosis not present

## 2018-11-29 DIAGNOSIS — E785 Hyperlipidemia, unspecified: Secondary | ICD-10-CM | POA: Diagnosis not present

## 2018-11-29 NOTE — Telephone Encounter (Signed)
Paged by [facility] for critical glucose of 502 on CMP.  Lab work ordered by Dr. Tresa EndoKelly as well as his kidney doctor.  Patient states that he did not took his insulin last night because it required fasting labs this morning for lipid check.  His blood sugar is controlled today after starting insulin.  Final results to follow.  No change.

## 2018-11-30 LAB — COMPREHENSIVE METABOLIC PANEL
ALBUMIN: 3.9 g/dL (ref 3.5–4.8)
ALT: 21 IU/L (ref 0–44)
AST: 26 IU/L (ref 0–40)
Albumin/Globulin Ratio: 1.5 (ref 1.2–2.2)
Alkaline Phosphatase: 110 IU/L (ref 39–117)
BILIRUBIN TOTAL: 0.5 mg/dL (ref 0.0–1.2)
BUN / CREAT RATIO: 16 (ref 10–24)
BUN: 30 mg/dL — AB (ref 8–27)
CHLORIDE: 101 mmol/L (ref 96–106)
CO2: 20 mmol/L (ref 20–29)
CREATININE: 1.88 mg/dL — AB (ref 0.76–1.27)
Calcium: 8.8 mg/dL (ref 8.6–10.2)
GFR calc non Af Amer: 34 mL/min/{1.73_m2} — ABNORMAL LOW (ref >59)
GFR, EST AFRICAN AMERICAN: 39 mL/min/{1.73_m2} — AB (ref >59)
GLUCOSE: 502 mg/dL — AB (ref 65–99)
Globulin, Total: 2.6 g/dL (ref 1.5–4.5)
Potassium: 4.8 mmol/L (ref 3.5–5.2)
Sodium: 139 mmol/L (ref 134–144)
Total Protein: 6.5 g/dL (ref 6.0–8.5)

## 2018-11-30 LAB — LIPID PANEL
CHOLESTEROL TOTAL: 142 mg/dL (ref 100–199)
Chol/HDL Ratio: 3.9 ratio (ref 0.0–5.0)
HDL: 36 mg/dL — ABNORMAL LOW (ref >39)
LDL CALC: 90 mg/dL (ref 0–99)
TRIGLYCERIDES: 81 mg/dL (ref 0–149)
VLDL CHOLESTEROL CAL: 16 mg/dL (ref 5–40)

## 2018-11-30 LAB — BRAIN NATRIURETIC PEPTIDE: BNP: 700.9 pg/mL — ABNORMAL HIGH (ref 0.0–100.0)

## 2018-12-03 ENCOUNTER — Other Ambulatory Visit (HOSPITAL_BASED_OUTPATIENT_CLINIC_OR_DEPARTMENT_OTHER): Payer: Self-pay | Admitting: Internal Medicine

## 2018-12-03 ENCOUNTER — Ambulatory Visit (HOSPITAL_BASED_OUTPATIENT_CLINIC_OR_DEPARTMENT_OTHER)
Admission: RE | Admit: 2018-12-03 | Discharge: 2018-12-03 | Disposition: A | Discharge status: Home / Self Care | Payer: Medicare Other | Source: Ambulatory Visit | Attending: Internal Medicine | Admitting: Internal Medicine

## 2018-12-03 DIAGNOSIS — R0602 Shortness of breath: Secondary | ICD-10-CM

## 2018-12-04 ENCOUNTER — Ambulatory Visit (HOSPITAL_BASED_OUTPATIENT_CLINIC_OR_DEPARTMENT_OTHER)
Admission: RE | Admit: 2018-12-04 | Discharge: 2018-12-04 | Disposition: A | Discharge status: Home / Self Care | Payer: Medicare Other | Source: Ambulatory Visit | Attending: Internal Medicine | Admitting: Internal Medicine

## 2018-12-04 DIAGNOSIS — I10 Essential (primary) hypertension: Secondary | ICD-10-CM

## 2018-12-04 NOTE — Progress Notes (Signed)
  Echocardiogram 2D Echocardiogram has been performed.  Detra Bores T Donevin Sainsbury 12/04/2018, 10:58 AM

## 2018-12-09 ENCOUNTER — Encounter: Payer: Self-pay | Admitting: Podiatry

## 2018-12-09 ENCOUNTER — Ambulatory Visit (INDEPENDENT_AMBULATORY_CARE_PROVIDER_SITE_OTHER): Payer: Medicare Other | Admitting: Podiatry

## 2018-12-09 VITALS — Temp 98.0°F

## 2018-12-09 DIAGNOSIS — I6523 Occlusion and stenosis of bilateral carotid arteries: Secondary | ICD-10-CM | POA: Diagnosis not present

## 2018-12-09 DIAGNOSIS — L97921 Non-pressure chronic ulcer of unspecified part of left lower leg limited to breakdown of skin: Secondary | ICD-10-CM

## 2018-12-12 ENCOUNTER — Other Ambulatory Visit (HOSPITAL_COMMUNITY): Payer: Medicare Other

## 2018-12-12 DIAGNOSIS — E559 Vitamin D deficiency, unspecified: Secondary | ICD-10-CM | POA: Diagnosis not present

## 2018-12-12 DIAGNOSIS — N183 Chronic kidney disease, stage 3 (moderate): Secondary | ICD-10-CM | POA: Diagnosis not present

## 2018-12-12 DIAGNOSIS — I10 Essential (primary) hypertension: Secondary | ICD-10-CM | POA: Diagnosis not present

## 2018-12-12 DIAGNOSIS — N390 Urinary tract infection, site not specified: Secondary | ICD-10-CM | POA: Diagnosis not present

## 2018-12-15 NOTE — Progress Notes (Signed)
Subjective: 77 year old male presents the office today for follow-up evaluation of a wound to the left anterior leg.  He states he is doing much better and is almost fully healed.  Is been given antibiotic ointment dressing changes on the area daily.  He has no new concerns.  Denies any drainage or pus or any red streaks. Denies any systemic complaints such as fevers, chills, nausea, vomiting. No acute changes since last appointment, and no other complaints at this time.   Objective: AAO x3, NAD DP/PT pulses palpable bilaterally, CRT less than 3 seconds Wound on the anterior aspect the left leg is almost completely healed at this time there is no drainage or pus there is no fluctuation crepitation or malodor.  No ascending cellulitis. No open lesions or pre-ulcerative lesions.  No pain with calf compression, swelling, warmth, erythema  Assessment: Healing wound left foot  Plan: -All treatment options discussed with the patient including all alternatives, risks, complications.  -Continue daily dressing changes for now.  If not completely healed next 2 weeks to let me know.  Monitor for any signs or symptoms of infection.  He is doing much better. -He brought in blood work for me to review.  His creatinine is 1.88 glucose 2, BNP 700-continue to follow with his primary care physician. -Patient encouraged to call the office with any questions, concerns, change in symptoms.   Vivi BarrackMatthew R Wagoner DPM

## 2018-12-20 DIAGNOSIS — I10 Essential (primary) hypertension: Secondary | ICD-10-CM | POA: Diagnosis not present

## 2018-12-20 DIAGNOSIS — N183 Chronic kidney disease, stage 3 (moderate): Secondary | ICD-10-CM | POA: Diagnosis not present

## 2018-12-20 DIAGNOSIS — I110 Hypertensive heart disease with heart failure: Secondary | ICD-10-CM | POA: Diagnosis not present

## 2018-12-24 ENCOUNTER — Ambulatory Visit (INDEPENDENT_AMBULATORY_CARE_PROVIDER_SITE_OTHER): Payer: Medicare Other | Admitting: Physician Assistant

## 2018-12-24 ENCOUNTER — Encounter: Payer: Self-pay | Admitting: Physician Assistant

## 2018-12-24 VITALS — BP 125/67 | Ht 70.0 in | Wt 164.4 lb

## 2018-12-24 DIAGNOSIS — IMO0001 Reserved for inherently not codable concepts without codable children: Secondary | ICD-10-CM

## 2018-12-24 DIAGNOSIS — R0600 Dyspnea, unspecified: Secondary | ICD-10-CM | POA: Diagnosis not present

## 2018-12-24 DIAGNOSIS — I5032 Chronic diastolic (congestive) heart failure: Secondary | ICD-10-CM | POA: Diagnosis not present

## 2018-12-24 DIAGNOSIS — E785 Hyperlipidemia, unspecified: Secondary | ICD-10-CM | POA: Diagnosis not present

## 2018-12-24 DIAGNOSIS — I6523 Occlusion and stenosis of bilateral carotid arteries: Secondary | ICD-10-CM

## 2018-12-24 DIAGNOSIS — I10 Essential (primary) hypertension: Secondary | ICD-10-CM

## 2018-12-24 DIAGNOSIS — I25810 Atherosclerosis of coronary artery bypass graft(s) without angina pectoris: Secondary | ICD-10-CM | POA: Diagnosis not present

## 2018-12-24 DIAGNOSIS — Z794 Long term (current) use of insulin: Secondary | ICD-10-CM | POA: Diagnosis not present

## 2018-12-24 DIAGNOSIS — I340 Nonrheumatic mitral (valve) insufficiency: Secondary | ICD-10-CM | POA: Diagnosis not present

## 2018-12-24 DIAGNOSIS — E119 Type 2 diabetes mellitus without complications: Secondary | ICD-10-CM | POA: Diagnosis not present

## 2018-12-24 MED ORDER — TORSEMIDE 20 MG PO TABS: 20.0000 mg | ORAL_TABLET | Freq: Two times a day (BID) | ORAL | 2 refills | Status: DC

## 2018-12-24 NOTE — Patient Instructions (Signed)
Medication Instructions:  Start taking Torsemide 20 mg twice daily.  If you need a refill on your cardiac medications before your next appointment, please call your pharmacy.   Lab work: [facility] next [facility] If you have labs (blood work) drawn today and your tests are completely normal, you will receive your results only by: Marland Kitchen. MyChart Message (if you have MyChart) OR . A paper copy in the mail If you have any lab test that is abnormal or we need to change your treatment, we will call you to review the results.  Follow-Up: At [facility], you and your health needs are our priority.  As part of our continuing mission to provide you with exceptional heart care, we have created designated Provider Care Teams.  These Care Teams include your primary Cardiologist (physician) and Advanced Practice Providers (APPs -  Physician Assistants and Nurse Practitioners) who all work together to provide you with the care you need, when you need it. Marland Kitchen. Keep follow up with Azalee CourseHao Meng, PA-C

## 2018-12-24 NOTE — Progress Notes (Signed)
Cardiology Office Note    Date:  12/26/2018   ID:  Henry [name]., DOB 06/06/1941, MRN [id number]  PCP:  Overton Mam, DO  Cardiologist:  Dr. Tresa Endo   Chief Complaint  Patient presents with  . Follow-up    seen for Dr. Tresa Endo.     History of Present Illness:  Henry [name]. is a 77 y.o. male with PMH of type 1 DM, HTN, HLD, PAD, and CAD.  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  He underwent PTCA of his RCA.  Due to extensive coronary artery disease, he ultimately underwent elective CABG.  He also had a history of right carotid endarterectomy.  Cardiac catheterization in March 2011 showed 95% stenosis in SVG to RCA, he ultimately underwent successful stenting with 3.0 x 26 mm stent.  A bare metal stent was used in light of his significant diabetic retinopathy with significant bleeding history to reduce his need for long-term dual antiplatelet therapy. He had episodes of dizziness in the past concerning for autonomic neuropathy.  However he never had any syncope.  He uses gabapentin for peripheral neuropathy. There is also a possibility of hydralazine related swelling back in 2011 as well.  With regard to his peripheral arterial disease, he has occluded anterior tibial and posterior tibial artery on the right, early high-grade left SFA stenosis with occluded left posterior tibial artery.  He has chronic claudication symptoms, however there was no significant progression in his symptom.  Therefore Dr. Allyson Sabal recommended no further therapy.  He is intolerant of Crestor, Lipitor and Zocor. He is unable to tolerate ACE inhibitor or ARB.   I last saw the patient on 02/20/2018 for dyspnea on exertion.  He was noted to have a significant heart murmur on physical exam.  I recommended outpatient Lexiscan Myoview and echocardiogram.  Also referred the patient to the lipid clinic for consideration of PCSK9 inhibitor.  Myoview obtained on 03/08/2018 showed EF  46%, fixed medium sized intermediate intensity basal inferoseptal/inferior, and mid inferior perfusion defect to suggest prior infarct without evidence of significant ischemia, overall intermediate risk study primarily due to low EF.  Echocardiogram obtained on 03/14/2018 showed EF 50 to 55%, mild LVH, grade 2 DD, mild MR, PA peak pressure 44 mmHg.  He was seen by Dr. Gery Pray in May 2019, ABI revealed 0.73 on the left, 1.18 on the right.  Given his diabetes and the renal insufficiency, medical therapy was recommended.  Since the previous visit, patient was admitted to the hospital in August 2019 with dizziness.  MRI of the brain was negative for acute CVA.  He did have mild acute kidney injury that was treated with IV hydration.  He was last seen by Dr. Tresa Endo on 11/14/2018, he was noted to be mildly volume overloaded, he was started on 40 mg daily of Lasix.  However on follow-up labwork, his creatinine trended up to 1.88, glucose was 502 at the time.  Based on the phone note, he did not take the insulin tonight prior so he could obtain the fasting lab work.  Chest x-ray obtained on 12/03/2018 showed pulmonary vascular congestion with probable mild pulmonary edema, increased bibasilar pleural effusion and atelectasis.  Repeat echocardiogram obtained on 12/04/2018 showed mild LVH, EF 50 to 55%, moderate to severe functional eccentric mitral regurgitation, PA peak pressure 79 mmHg, small pericardial effusion noted posterior to the heart, left pleural effusion.  Patient presents today for cardiology office  visit.  According to the patient, his nephrologist has switched his 40 mg daily of Lasix to 20 mg of torsemide.  He was instructed to take 20 mg twice daily of torsemide if his weight is over 165 pounds and take 20 mg daily of torsemide if the weight is less than 165 pounds.  In the past week, he has taken twice a day of torsemide about half of the time.  He has lost roughly 10 pounds and breathing has improved as  well.  However he continued to have orthopnea at this time.  On physical exam, I did not hear significant crackles in his lung.  There was no evidence of large pleural effusion on physical exam either.  I have discussed the case with Dr. Tresa EndoKelly, given the complexity of his issues, I cannot say for certain if his worsening mitral regurgitation is ischemic in etiology.  EKG today does reveal worsening T wave inversion in inferolateral leads, however patient denies any obvious chest discomfort with normal activity.  He has been noticing chest discomfort with more strenuous activity, however this is chronic for him and he has not had any recent changes.  In the end, we recommended increase torsemide to 20 mg twice daily regardless of his weight.  He will need basic metabolic panel next Monday.  I will bring him back in 2 weeks for reassessment, if symptom is not improved, I will recommend left and right heart cath.  Prior to left heart cath, may need to rehydrate him to improve the kidney function.   Past Medical History:  Diagnosis Date  . ANEMIA-NOS 07/10/2007  . CORONARY ARTERY DISEASE 07/10/2007  . DIABETES MELLITUS, TYPE I 07/10/2007  . HYPERLIPIDEMIA 07/10/2007  . HYPERTENSION 07/10/2007  . Peripheral arterial disease (HCC)   . 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 RENAL INSUFFICIENCY 07/10/2007    Past Surgical History:  Procedure Laterality Date  . APPENDECTOMY    . CARDIAC CATHETERIZATION     stenting in the vein graft to the right coronary artery and had_DES 3x 26 mm Integrity stent inserted, post dilated to 3.4 mm  . CAROTID ENDARTERECTOMY Right    right  . CHOLECYSTECTOMY N/A 09/13/2017   Procedure: LAPAROSCOPIC CHOLECYSTECTOMY;  Surgeon: Kinsinger, De BlanchLuke Aaron, MD;  Location: [facility];  Service: General;  Laterality: N/A;  . CORONARY ARTERY BYPASS GRAFT  02/2010   PTCA of his right coroary artery.   . ESOPHAGOGASTRODUODENOSCOPY N/A 09/11/2017   Procedure:  ESOPHAGOGASTRODUODENOSCOPY (EGD);  Surgeon: Hilarie FredricksonPerry, John N, MD;  [facility];  Service: Endoscopy;  Laterality: N/A;    Current Medications: Outpatient Medications Prior to Visit  Medication Sig Dispense Refill  . acetaminophen (TYLENOL) 500 MG tablet Take 500 mg by mouth daily as needed (pain).    Marland Kitchen. amLODipine (NORVASC) 2.5 MG tablet Take 2.5 mg by mouth daily.     Marland Kitchen. aspirin EC 81 MG tablet Take 81 mg by mouth daily.    . B Complex-C-Folic Acid (FOLBEE PLUS) TABS Take 1 tablet by mouth daily.      . Cholecalciferol (VITAMIN D) 2000 UNITS CAPS Take 2,000 Units by mouth daily.     Marland Kitchen. gabapentin (NEURONTIN) 100 MG capsule Take 100 mg by mouth at bedtime.     . insulin NPH-regular Human (NOVOLIN 70/30) (70-30) 100 UNIT/ML injection 41 units with breakfast and 25 units with supper 20 mL 11  . isosorbide mononitrate (IMDUR) 60 MG 24 hr tablet TAKE 1 TABLET BY MOUTH DAILY 30 tablet 9  .  metoprolol succinate (TOPROL-XL) 25 MG 24 hr tablet 2 (two) times daily.    Marland Kitchen omeprazole (PRILOSEC OTC) 20 MG tablet Take 20 mg by mouth daily.    . furosemide (LASIX) 40 MG tablet Take 1 tablet (40 mg total) by mouth daily. 90 tablet 3  . torsemide (DEMADEX) 20 MG tablet   1   No facility-administered medications prior to visit.      Allergies:   Hydralazine; Penicillins; Amlodipine besylate; Atorvastatin; Codeine; Contrast media [iodinated diagnostic agents]; Lipitor [atorvastatin calcium]; Other; Valsartan; and Zetia [ezetimibe]   Social History   Socioeconomic History  . Marital status: Married    Spouse name: Not on file  . Number of children: Not on file  . Years of education: BS  . Highest education level: Not on file  Occupational History  . Occupation: retired    Comment: 14 yrs ago  Social Needs  . Financial resource strain: Not on file  . Food insecurity:    Worry: Not on file    Inability: Not on file  . Transportation needs:    Medical: Not on file    Non-medical: Not on file    Tobacco Use  . Smoking status: Former Games developer  . Smokeless tobacco: Never Used  Substance and Sexual Activity  . Alcohol use: No  . Drug use: No  . Sexual activity: Not on file  Lifestyle  . Physical activity:    Days per week: Not on file    Minutes per session: Not on file  . Stress: Not on file  Relationships  . Social connections:    Talks on phone: Not on file    Gets together: Not on file    Attends religious service: Not on file    Active member of club or organization: Not on file    Attends meetings of clubs or organizations: Not on file    Relationship status: Not on file  Other Topics Concern  . Not on file  Social History Narrative   Says his diet and exercise are very good   Married lives with wife     Family History:  The patient's family history includes Heart disease in his mother.   ROS:   Please see the history of present illness.    ROS All other systems reviewed and are negative.   PHYSICAL EXAM:   VS:  BP 125/67   Ht 5\' 10"  (1.778 m)   Wt 164 lb 6.4 oz (74.6 kg)   BMI 23.59 kg/m    GEN: Well nourished, well developed, in no acute distress  HEENT: normal  Neck: no JVD, carotid bruits, or masses Cardiac: RRR; no murmurs, rubs, or gallops,no edema  Respiratory:  clear to auscultation bilaterally, normal work of breathing GI: soft, nontender, nondistended, + BS MS: no deformity or atrophy  Skin: warm and dry, no rash Neuro:  Alert and Oriented x 3, Strength and sensation are intact Psych: euthymic mood, full affect  Wt Readings from Last 3 Encounters:  12/24/18 164 lb 6.4 oz (74.6 kg)  11/27/18 167 lb 9.6 oz (76 kg)  11/14/18 175 lb 3.2 oz (79.5 kg)      Studies/Labs Reviewed:   EKG:  EKG is ordered today.  The ekg ordered today demonstrates NSR with TWI in inferolateral leads  Recent Labs: 07/27/2018: Hemoglobin 10.5; Platelets 224 08/13/2018: TSH 2.37 11/29/2018: ALT 21; BNP 700.9; BUN 30; Creatinine, Ser 1.88; Potassium 4.8; Sodium 139    Lipid Panel    Component  Value Date/Time   CHOL 142 11/29/2018 0858   TRIG 81 11/29/2018 0858   HDL 36 (L) 11/29/2018 0858   CHOLHDL 3.9 11/29/2018 0858   CHOLHDL 5.4 07/27/2018 0459   VLDL 25 07/27/2018 0459   LDLCALC 90 11/29/2018 0858    Additional studies/ records that were reviewed today include:   Myoview 03/08/2018 Study Highlights     The left ventricular ejection fraction is mildly decreased (45-54%).  Nuclear stress EF: 46%.  There was no ST segment deviation noted during stress.  Findings consistent with prior myocardial infarction.  This is an intermediate risk study.   1. EF 46%, inferior hypokinesis.  2. Fixed, medium-sized, intermediate intensity basal inferoseptal/inferior and mid inferior perfusion defect.  This suggests prior infarction without evidence for significant ischemia.   Intermediate risk study primarily due to low EF.     Echo 03/14/2018 LV EF: 50% -   55% Study Conclusions  - Left ventricle: The cavity size was normal. Wall thickness was   increased in a pattern of mild LVH. Systolic function was normal.   The estimated ejection fraction was in the range of 50% to 55%.   There is hypokinesis of the basalinferoseptal myocardium.   Features are consistent with a pseudonormal left ventricular   filling pattern, with concomitant abnormal relaxation and   increased filling pressure (grade 2 diastolic dysfunction). - Mitral valve: Calcified annulus. Mildly thickened leaflets .   There was mild regurgitation. - Left atrium: The atrium was mildly dilated. - Pulmonary arteries: Systolic pressure was mildly to moderately   increased. PA peak pressure: 44 mm Hg (S).   Echo 12/04/2018 LV EF: 50% -   55% Study Conclusions  - Left ventricle: The cavity size was normal. Wall thickness was   increased in a pattern of mild LVH. Systolic function was normal.   The estimated ejection fraction was in the range of 50% to 55%.   Wall motion  was normal; there were no regional wall motion   abnormalities. Doppler parameters are consistent with restrictive   physiology, indicative of decreased left ventricular diastolic   compliance and/or increased left atrial pressure. Doppler   parameters are consistent with high ventricular filling pressure. - Aortic valve: Valve area (VTI): 1.81 cm^2. Valve area (Vmax):   1.57 cm^2. Valve area (Vmean): 1.4 cm^2. - Mitral valve: There was moderate to severe functional ecentric   regurgitation, PISA 9mm howevere the PV&'s were not evaluated for   reflux. Valve area by continuity equation (using LVOT flow): 1.7   cm^2. - Pulmonary arteries: PA peak pressure: 79 mm Hg (S). - Pericardium, extracardiac: A small pericardial effusion was   identified posterior to the heart and along the right atrial free   wall. There was a left pleural effusion. - Impressions: The right ventricular systolic pressure was   increased consistent with severe pulmonary hypertension.  Impressions:  - The right ventricular systolic pressure was increased consistent   with severe pulmonary hypertension.    ASSESSMENT:    1. Dyspnea, unspecified type   2. Essential hypertension   3. Chronic diastolic heart failure (HCC)   4. Nonrheumatic mitral valve regurgitation   5. Coronary artery disease involving coronary bypass graft of native heart without angina pectoris   6. Hyperlipidemia LDL goal <70   7. IDDM (insulin dependent diabetes mellitus) (HCC)      PLAN:  In order of problems listed above:  1. Dyspnea: Although he denies any worsening chest discomfort, there is some T  wave inversion on the EKG that is new when compared to previous EKG.  He had more diuresis on the torsemide, his breathing has improved slightly after switching from Lasix, however symptoms persist.  I recommend he increase torsemide to 20 mg twice daily as a trial.  He will need basic metabolic panel next Monday.  However if his symptom  increases or persist, may need left and right heart cath.  His worsening mitral regurgitation may be also ischemic in nature as well.  2. Chronic diastolic heart failure: He is close to euvolemic level, however given persistent dyspnea, I have increased his torsemide to 20 mg twice daily.  He will need a basic metabolic panel next Monday  3. Mitral valve regurgitation: I suspect that he might have severe MR when I saw him in early 2019 based on physical exam, however echocardiogram at that time only showed a mild MR.  Recent echocardiogram is more consistent with moderate to severe MR.  I have discussed this with Dr. Tresa Endo as well, there is a question whether or not his MR may be ischemic in nature  4. CAD s/p CABG: Previous stress test in early 2019 showed scar but no ischemia.  Recent symptom is concerning for anginal equivalent.  5. Type 1 diabetes mellitus: Continue insulin  6. Hyperlipidemia: History of intolerance to Lipitor and Zetia.  Recent LDL was 90 on 11/29/2018.  Will need to discuss PCSK9 inhibitor again    Medication Adjustments/Labs and Tests Ordered: Current medicines are reviewed at length with the patient today.  Concerns regarding medicines are outlined above.  Medication changes, Labs and Tests ordered today are listed in the Patient Instructions below. Patient Instructions  Medication Instructions:  Start taking Torsemide 20 mg twice daily.  If you need a refill on your cardiac medications before your next appointment, please call your pharmacy.   Lab work: BMET next [facility] If you have labs (blood work) drawn today and your tests are completely normal, you will receive your results only by: Marland Kitchen MyChart Message (if you have MyChart) OR . A paper copy in the mail If you have any lab test that is abnormal or we need to change your treatment, we will call you to review the results.  Follow-Up: At [facility], you and your health needs are our priority.  As part of our  continuing mission to provide you with exceptional heart care, we have created designated Provider Care Teams.  These Care Teams include your primary Cardiologist (physician) and Advanced Practice Providers (APPs -  Physician Assistants and Nurse Practitioners) who all work together to provide you with the care you need, when you need it. Marland Kitchen Keep follow up with Azalee Course, PA-C        Signed, Azalee Course, [place]  12/26/2018 11:30 PM    [facility] [address] [place], [place], [place]  [address] Phone: [phone]; Fax: [phone]

## 2018-12-27 DIAGNOSIS — I10 Essential (primary) hypertension: Secondary | ICD-10-CM | POA: Diagnosis not present

## 2018-12-27 DIAGNOSIS — N183 Chronic kidney disease, stage 3 (moderate): Secondary | ICD-10-CM | POA: Diagnosis not present

## 2018-12-27 DIAGNOSIS — E559 Vitamin D deficiency, unspecified: Secondary | ICD-10-CM | POA: Diagnosis not present

## 2018-12-27 DIAGNOSIS — N390 Urinary tract infection, site not specified: Secondary | ICD-10-CM | POA: Diagnosis not present

## 2018-12-30 ENCOUNTER — Telehealth: Payer: Self-pay | Admitting: Physician Assistant

## 2018-12-30 DIAGNOSIS — I10 Essential (primary) hypertension: Secondary | ICD-10-CM | POA: Diagnosis not present

## 2018-12-30 LAB — BASIC METABOLIC PANEL
BUN/Creatinine Ratio: 16 (ref 10–24)
BUN: 35 mg/dL — ABNORMAL HIGH (ref 8–27)
CALCIUM: 9.2 mg/dL (ref 8.6–10.2)
CO2: 24 mmol/L (ref 20–29)
Chloride: 95 mmol/L — ABNORMAL LOW (ref 96–106)
Creatinine, Ser: 2.21 mg/dL — ABNORMAL HIGH (ref 0.76–1.27)
GFR calc Af Amer: 32 mL/min/{1.73_m2} — ABNORMAL LOW (ref >59)
GFR, EST NON AFRICAN AMERICAN: 28 mL/min/{1.73_m2} — AB (ref >59)
GLUCOSE: 417 mg/dL — AB (ref 65–99)
POTASSIUM: 4.9 mmol/L (ref 3.5–5.2)
Sodium: 138 mmol/L (ref 134–144)

## 2018-12-30 NOTE — Telephone Encounter (Signed)
Received office note form [facility] on 12/30/18, Appt 01/02/19 @ 11:30AM.[place]

## 2019-01-02 ENCOUNTER — Ambulatory Visit
Admission: RE | Admit: 2019-01-02 | Discharge: 2019-01-02 | Disposition: A | Discharge status: Home / Self Care | Payer: Medicare Other | Source: Ambulatory Visit | Attending: Physician Assistant | Admitting: Physician Assistant

## 2019-01-02 ENCOUNTER — Encounter: Payer: Self-pay | Admitting: Physician Assistant

## 2019-01-02 ENCOUNTER — Ambulatory Visit (INDEPENDENT_AMBULATORY_CARE_PROVIDER_SITE_OTHER): Payer: Medicare Other | Admitting: Physician Assistant

## 2019-01-02 ENCOUNTER — Other Ambulatory Visit: Payer: Self-pay

## 2019-01-02 VITALS — BP 118/62 | HR 62 | Ht 70.0 in | Wt 166.2 lb

## 2019-01-02 DIAGNOSIS — N289 Disorder of kidney and ureter, unspecified: Secondary | ICD-10-CM

## 2019-01-02 DIAGNOSIS — I10 Essential (primary) hypertension: Secondary | ICD-10-CM

## 2019-01-02 DIAGNOSIS — Z794 Long term (current) use of insulin: Secondary | ICD-10-CM | POA: Diagnosis not present

## 2019-01-02 DIAGNOSIS — J90 Pleural effusion, not elsewhere classified: Secondary | ICD-10-CM | POA: Diagnosis not present

## 2019-01-02 DIAGNOSIS — N189 Chronic kidney disease, unspecified: Secondary | ICD-10-CM

## 2019-01-02 DIAGNOSIS — E785 Hyperlipidemia, unspecified: Secondary | ICD-10-CM

## 2019-01-02 DIAGNOSIS — R0602 Shortness of breath: Secondary | ICD-10-CM | POA: Diagnosis not present

## 2019-01-02 DIAGNOSIS — E119 Type 2 diabetes mellitus without complications: Secondary | ICD-10-CM

## 2019-01-02 DIAGNOSIS — IMO0001 Reserved for inherently not codable concepts without codable children: Secondary | ICD-10-CM

## 2019-01-02 DIAGNOSIS — I25119 Atherosclerotic heart disease of native coronary artery with unspecified angina pectoris: Secondary | ICD-10-CM

## 2019-01-02 MED ORDER — TORSEMIDE 20 MG PO TABS: 20.0000 mg | ORAL_TABLET | Freq: Every day | ORAL | 2 refills | Status: AC

## 2019-01-02 NOTE — Patient Instructions (Addendum)
Medication Instructions:  HOLD torsemide the remainder of today, all day tomorrow, all day Saturday.  RESUME torsemide on Sunday - take 1 tablet daily (instead of 2)  If you need a refill on your cardiac medications before your next appointment, please call your pharmacy.   Lab work: BMET on Tuesday January 14 If you have labs (blood work) drawn today and your tests are completely normal, you will receive your results only by: Marland Kitchen MyChart Message (if you have MyChart) OR . A paper copy in the mail If you have any lab test that is abnormal or we need to change your treatment, we will call you to review the results.  Testing/Procedures: Chest CT today @ [facility] ([address])  Follow-Up: At [facility], you and your health needs are our priority.  As part of our continuing mission to provide you with exceptional heart care, we have created designated Provider Care Teams.  These Care Teams include your primary Cardiologist (physician) and Advanced Practice Providers (APPs -  Physician Assistants and Nurse Practitioners) who all work together to provide you with the care you need, when you need it.  You will need a follow up appointment in 1 month with [facility]   You will need a follow up appointment in 3 months with Dr. Tresa Endo.  Please call our office 2 months in advance to schedule this appointment.  The following Advanced Practice Providers are on your designated Care Team: Azalee Course, [place] . Micah Flesher, PA-C  Any Other Special Instructions Will Be Listed Below (If Applicable).

## 2019-01-02 NOTE — H&P (View-Only) (Signed)
Cardiology Office Note    Date:  01/04/2019   ID:  Henry Less., DOB Jul 03, 1941, MRN [id number]  PCP:  Overton Mam, DO  Cardiologist:  Dr. Tresa Endo   Chief Complaint  Patient presents with  . Follow-up    seen for Dr. Tresa Endo    History of Present Illness:  Henry [name]. is a 78 y.o. 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 He underwent PTCA of his RCA. Due to extensive coronary artery disease, he ultimately underwent elective CABG. He also had a history of right carotid endarterectomy. Cardiac catheterization in March 2011 showed 95% stenosis in SVG to RCA, he ultimately underwent successful stenting with 3.0 x 26 mm stent. A bare metal stent was used in light of his significant diabetic retinopathy with significant bleeding history to reduce his need for long-term dual antiplatelet therapy. He had episodes of dizziness in the past concerning for autonomic neuropathy. However he never had any syncope. He uses gabapentin for peripheral neuropathy.There is also a possibility of hydralazine related swelling back in 2011 as well. With regard to his peripheral arterial disease, he has occluded anterior tibial and posterior tibial artery on the right, early high-grade left SFA stenosis with occluded left posterior tibial artery. He has chronic claudication symptoms, however there was no significant progression in his symptom. Therefore Dr. Allyson Sabal recommended no further therapy. He is intolerant of Crestor, Lipitor and Zocor. He is unable to tolerate ACE inhibitor or ARB.  Patient had a Myoview on 03/08/2018 which showed EF 46%, fixed medium sized intermediate intensity basal inferoseptal/inferior, and mid inferior perfusion defect suggestive of previous infarct without any evidence of significant ischemia, overall intermediate risk study primarily due to low EF.  Echocardiogram  obtained on 03/14/2018 showed EF 50 to 55%, mild LVH, grade 2 DD, mild MR, PA peak pressure 44 mmHg.  He was seen by Dr. Allyson Sabal in May 2019, ABI revealed 0.73 on the left, 1.18 on the right.  Given his diabetes and renal insufficiency, medical therapy was recommended.  Patient was admitted to the hospital in August 2019 with dizziness.  MRI of the brain was negative for acute CVA.  He did have mild acute kidney injury that was treated with IV hydration.  He was last seen by Dr. Tresa Endo on 11/14/2018, he was noted to be mildly volume overloaded, he was started on 40 mg daily of Lasix.  However on follow-up labwork, his creatinine trended up to 1.88, glucose was 502 at the time.  Based on the phone note, he did not take the insulin tonight prior so he could obtain the fasting lab work.  Chest x-ray obtained on 12/03/2018 showed pulmonary vascular congestion with probable mild pulmonary edema, increased bibasilar pleural effusion and atelectasis.  Repeat echocardiogram obtained on 12/04/2018 showed mild LVH, EF 50 to 55%, moderate to severe functional eccentric mitral regurgitation, PA peak pressure 79 mmHg, small pericardial effusion noted posterior to the heart, left pleural effusion.  His nephrologist switched his 40 mg daily of Lasix to 20 mg of torsemide.  I previously saw the patient on 12/23/2018, he was complaining of increasing shortness of breath and trouble laying down at the time.  After discussing with Dr. Tresa Endo, I opted to increase his torsemide to 20 mg twice daily.  Follow-up lab work shows worsening renal function.  Therefore I decreased torsemide to 20 mg  daily.  He presents today for cardiology office visit, he is still short of breath every time he lays down or with minimal exertion.  I discussed the case with Dr. Tresa EndoKelly again, we will proceed with left and right heart cath.  However, we will need to make sure his creatinine dropped below 2.0 before proceeding with the procedure.  I asked the patient  to hold torsemide tomorrow and Saturday before restarting on Sunday.  He will obtain lab work on Monday, if renal function is stable at that time, then we will proceed with left and  right cardiac catheterization.  If renal function is significantly down, we may have to consider overnight hydration prior to cardiac catheterization.  His nephrologist recommended 1200 mg of Mucomyst prior to the cardiac cath to help decrease probability of contrast nephropathy.  I did discuss with him risk and benefit of the procedure, he is willing to proceed once his renal function improves more.  He did mention there is a possibility that he may require eye surgery in the future, he has a history of retinal bleeding, however no issue.  I will defer to Dr. Tresa EndoKelly to decide if bare-metal stent will be more appropriate in this patient.   Past Medical History:  Diagnosis Date  . ANEMIA-NOS 07/10/2007  . CORONARY ARTERY DISEASE 07/10/2007  . DIABETES MELLITUS, TYPE I 07/10/2007  . HYPERLIPIDEMIA 07/10/2007  . HYPERTENSION 07/10/2007  . Peripheral arterial disease (HCC)   . 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 RENAL INSUFFICIENCY 07/10/2007    Past Surgical History:  Procedure Laterality Date  . APPENDECTOMY    . CARDIAC CATHETERIZATION     stenting in the vein graft to the right coronary artery and had_DES 3x 26 mm Integrity stent inserted, post dilated to 3.4 mm  . CAROTID ENDARTERECTOMY Right    right  . CHOLECYSTECTOMY N/A 09/13/2017   Procedure: LAPAROSCOPIC CHOLECYSTECTOMY;  Surgeon: Kinsinger, De BlanchLuke Aaron, MD;  Location: [facility];  Service: General;  Laterality: N/A;  . CORONARY ARTERY BYPASS GRAFT  02/2010   PTCA of his right coroary artery.   . ESOPHAGOGASTRODUODENOSCOPY N/A 09/11/2017   Procedure: ESOPHAGOGASTRODUODENOSCOPY (EGD);  Surgeon: Hilarie FredricksonPerry, John N, MD;  [facility];  Service: Endoscopy;  Laterality: N/A;    Current Medications: Outpatient Medications Prior to Visit    Medication Sig Dispense Refill  . acetaminophen (TYLENOL) 500 MG tablet Take 500 mg by mouth daily as needed (pain).    Marland Kitchen. amLODipine (NORVASC) 2.5 MG tablet Take 2.5 mg by mouth daily.     Marland Kitchen. aspirin EC 81 MG tablet Take 81 mg by mouth daily.    . B Complex-C-Folic Acid (FOLBEE PLUS) TABS Take 1 tablet by mouth daily.      . Cholecalciferol (VITAMIN D) 2000 UNITS CAPS Take 2,000 Units by mouth daily.     Marland Kitchen. gabapentin (NEURONTIN) 100 MG capsule Take 100 mg by mouth at bedtime.     . insulin NPH-regular Human (NOVOLIN 70/30) (70-30) 100 UNIT/ML injection 41 units with breakfast and 25 units with supper 20 mL 11  . isosorbide mononitrate (IMDUR) 60 MG 24 hr tablet TAKE 1 TABLET BY MOUTH DAILY 30 tablet 9  . metoprolol succinate (TOPROL-XL) 25 MG 24 hr tablet 2 (two) times daily.    Marland Kitchen. omeprazole (PRILOSEC OTC) 20 MG tablet Take 20 mg by mouth daily.    Marland Kitchen. torsemide (DEMADEX) 20 MG tablet Take 1 tablet (20 mg total) by mouth 2 (two) times daily. 60 tablet 2  No facility-administered medications prior to visit.      Allergies:   Hydralazine; Penicillins; Amlodipine besylate; Atorvastatin; Codeine; Contrast media [iodinated diagnostic agents]; Lipitor [atorvastatin calcium]; Other; Valsartan; and Zetia [ezetimibe]   Social History   Socioeconomic History  . Marital status: Married    Spouse name: Not on file  . Number of children: Not on file  . Years of education: BS  . Highest education level: Not on file  Occupational History  . Occupation: retired    Comment: 14 yrs ago  Social Needs  . Financial resource strain: Not on file  . Food insecurity:    Worry: Not on file    Inability: Not on file  . Transportation needs:    Medical: Not on file    Non-medical: Not on file  Tobacco Use  . Smoking status: Former Games developermoker  . Smokeless tobacco: Never Used  Substance and Sexual Activity  . Alcohol use: No  . Drug use: No  . Sexual activity: Not on file  Lifestyle  . Physical activity:     Days per week: Not on file    Minutes per session: Not on file  . Stress: Not on file  Relationships  . Social connections:    Talks on phone: Not on file    Gets together: Not on file    Attends religious service: Not on file    Active member of club or organization: Not on file    Attends meetings of clubs or organizations: Not on file    Relationship status: Not on file  Other Topics Concern  . Not on file  Social History Narrative   Says his diet and exercise are very good   Married lives with wife     Family History:  The patient's family history includes Heart disease in his mother.   ROS:   Please see the history of present illness.    ROS All other systems reviewed and are negative.   PHYSICAL EXAM:   VS:  BP 118/62   Pulse 62   Ht 5\' 10"  (1.778 m)   Wt 166 lb 3.2 oz (75.4 kg)   BMI 23.85 kg/m    GEN: Well nourished, well developed, in no acute distress  HEENT: normal  Neck: no JVD, carotid bruits, or masses Cardiac: RRR; no rubs, or gallops. 1+ ankle edema  3/6 systolic murmur in the mitral area Respiratory:  clear to auscultation bilaterally, normal work of breathing GI: soft, nontender, nondistended, + BS MS: no deformity or atrophy  Skin: warm and dry, no rash Neuro:  Alert and Oriented x 3, Strength and sensation are intact Psych: euthymic mood, full affect  Wt Readings from Last 3 Encounters:  01/02/19 166 lb 3.2 oz (75.4 kg)  12/24/18 164 lb 6.4 oz (74.6 kg)  11/27/18 167 lb 9.6 oz (76 kg)      Studies/Labs Reviewed:   EKG:  EKG is not ordered today.    Recent Labs: 07/27/2018: Hemoglobin 10.5; Platelets 224 08/13/2018: TSH 2.37 11/29/2018: ALT 21; BNP 700.9 12/30/2018: BUN 35; Creatinine, Ser 2.21; Potassium 4.9; Sodium 138   Lipid Panel    Component Value Date/Time   CHOL 142 11/29/2018 0858   TRIG 81 11/29/2018 0858   HDL 36 (L) 11/29/2018 0858   CHOLHDL 3.9 11/29/2018 0858   CHOLHDL 5.4 07/27/2018 0459   VLDL 25 07/27/2018 0459    LDLCALC 90 11/29/2018 0858    Additional studies/ records that were reviewed today include:  Echo 12/04/2018 LV EF: 50% -   55% Study Conclusions  - Left ventricle: The cavity size was normal. Wall thickness was   increased in a pattern of mild LVH. Systolic function was normal.   The estimated ejection fraction was in the range of 50% to 55%.   Wall motion was normal; there were no regional wall motion   abnormalities. Doppler parameters are consistent with restrictive   physiology, indicative of decreased left ventricular diastolic   compliance and/or increased left atrial pressure. Doppler   parameters are consistent with high ventricular filling pressure. - Aortic valve: Valve area (VTI): 1.81 cm^2. Valve area (Vmax):   1.57 cm^2. Valve area (Vmean): 1.4 cm^2. - Mitral valve: There was moderate to severe functional ecentric   regurgitation, PISA 9mm howevere the PV&'s were not evaluated for   reflux. Valve area by continuity equation (using LVOT flow): 1.7   cm^2. - Pulmonary arteries: PA peak pressure: 79 mm Hg (S). - Pericardium, extracardiac: A small pericardial effusion was   identified posterior to the heart and along the right atrial free   wall. There was a left pleural effusion. - Impressions: The right ventricular systolic pressure was   increased consistent with severe pulmonary hypertension.  Impressions:  - The right ventricular systolic pressure was increased consistent   with severe pulmonary hypertension.    ASSESSMENT:    1. Shortness of breath   2. Acute on chronic renal insufficiency   3. IDDM (insulin dependent diabetes mellitus) (HCC)   4. Essential hypertension   5. Hyperlipidemia LDL goal <70   6. Coronary artery disease involving native coronary artery of native heart with angina pectoris (HCC)      PLAN:  In order of problems listed above:  1. Worsening dyspnea: I try to uptitrate his torsemide to 20 mg twice daily, however this did  not improve his symptoms.  Given the recent worsening mitral regurgitation seen on echocardiogram and EKG changes, I discussed it with Dr. Tresa Endo, we plan to proceed with left and right heart cath.  However this will need to wait until his renal function improve.  I instructed the patient to hold torsemide for 2 days before restarting on Sunday, we plan to obtain lab work on Monday, if renal function is stable, I plan to set the patient up for cardiac catheterization next Wednesday with Dr. Tresa Endo is in the Cath Lab.  Note, patient is worried about his prior history of retinal bleeding, I will defer to Dr. Tresa Endo to consider if he would like to use a bare-metal stenting instead of drug-eluting stent to minimize the duration of dual antiplatelet therapy  -Obtain CT of chest to rule out other secondary causes for shortness of breath  2. CAD: Although he denies any recent chest pain, however his worsening dyspnea is concerning for anginal equivalent.  3. Mitral regurgitation: Although I suspected severe MR in the beginning of 2019 based on physical exam at the time, echocardiogram at the time did not support this diagnosis.  Most recent echocardiogram however does confirm moderate to severe MR.  I wonder if his recent symptom is due to severe MR, therefore we will proceed with left and right heart cath.  There is a question whether or not his severe MR is ischemic in nature.  4. Acute on chronic renal insufficiency: Change torsemide to 20 mg daily, hold diuretic for 2 days before restarting on Sunday.  Basic metabolic panel next Monday  5. Insulin dependent diabetes mellitus: Managed  by primary care provider  6. Hypertension: Blood pressure stable on current therapy  7. Hyperlipidemia: Continue statin    Medication Adjustments/Labs and Tests Ordered: Current medicines are reviewed at length with the patient today.  Concerns regarding medicines are outlined above.  Medication changes, Labs and Tests  ordered today are listed in the Patient Instructions below. Patient Instructions  Medication Instructions:  HOLD torsemide the remainder of today, all day tomorrow, all day Saturday.  RESUME torsemide on Sunday - take 1 tablet daily (instead of 2)  If you need a refill on your cardiac medications before your next appointment, please call your pharmacy.   Lab work: BMET on Tuesday January 14 If you have labs (blood work) drawn today and your tests are completely normal, you will receive your results only by: Marland Kitchen MyChart Message (if you have MyChart) OR . A paper copy in the mail If you have any lab test that is abnormal or we need to change your treatment, we will call you to review the results.  Testing/Procedures: Chest CT today @ [facility] ([address])  Follow-Up: At [facility], you and your health needs are our priority.  As part of our continuing mission to provide you with exceptional heart care, we have created designated Provider Care Teams.  These Care Teams include your primary Cardiologist (physician) and Advanced Practice Providers (APPs -  Physician Assistants and Nurse Practitioners) who all work together to provide you with the care you need, when you need it.  You will need a follow up appointment in 1 month with [facility]   You will need a follow up appointment in 3 months with Dr. Tresa Endo.  Please call our office 2 months in advance to schedule this appointment.  The following Advanced Practice Providers are on your designated Care Team: Azalee Course, [place] . Micah Flesher, PA-C  Any Other Special Instructions Will Be Listed Below (If Applicable).       Ramond Dial, [place]  01/04/2019 11:45 PM    [facility] [address] [place], [place], [place]  [address] Phone: [phone]; Fax: [phone]

## 2019-01-02 NOTE — Progress Notes (Addendum)
Cardiology Office Note    Date:  01/04/2019   ID:  [name], DOB 10-Aug-1941, MRN [id number]  PCP:  Overton Mam, DO  Cardiologist:  Dr. Tresa Endo   Chief Complaint  Patient presents with  . Follow-up    seen for Dr. Tresa Endo    History of Present Illness:  Christyan Reger. is a 78 y.o. 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 He underwent PTCA of his RCA. Due to extensive coronary artery disease, he ultimately underwent elective CABG. He also had a history of right carotid endarterectomy. Cardiac catheterization in March 2011 showed 95% stenosis in SVG to RCA, he ultimately underwent successful stenting with 3.0 x 26 mm stent. A bare metal stent was used in light of his significant diabetic retinopathy with significant bleeding history to reduce his need for long-term dual antiplatelet therapy. He had episodes of dizziness in the past concerning for autonomic neuropathy. However he never had any syncope. He uses gabapentin for peripheral neuropathy.There is also a possibility of hydralazine related swelling back in 2011 as well. With regard to his peripheral arterial disease, he has occluded anterior tibial and posterior tibial artery on the right, early high-grade left SFA stenosis with occluded left posterior tibial artery. He has chronic claudication symptoms, however there was no significant progression in his symptom. Therefore Dr. Allyson Sabal recommended no further therapy. He is intolerant of Crestor, Lipitor and Zocor. He is unable to tolerate ACE inhibitor or ARB.  Patient had a Myoview on 03/08/2018 which showed EF 46%, fixed medium sized intermediate intensity basal inferoseptal/inferior, and mid inferior perfusion defect suggestive of previous infarct without any evidence of significant ischemia, overall intermediate risk study primarily due to low EF.  Echocardiogram  obtained on 03/14/2018 showed EF 50 to 55%, mild LVH, grade 2 DD, mild MR, PA peak pressure 44 mmHg.  He was seen by Dr. Allyson Sabal in May 2019, ABI revealed 0.73 on the left, 1.18 on the right.  Given his diabetes and renal insufficiency, medical therapy was recommended.  Patient was admitted to the hospital in August 2019 with dizziness.  MRI of the brain was negative for acute CVA.  He did have mild acute kidney injury that was treated with IV hydration.  He was last seen by Dr. Tresa Endo on 11/14/2018, he was noted to be mildly volume overloaded, he was started on 40 mg daily of Lasix.  However on follow-up labwork, his creatinine trended up to 1.88, glucose was 502 at the time.  Based on the phone note, he did not take the insulin tonight prior so he could obtain the fasting lab work.  Chest x-ray obtained on 12/03/2018 showed pulmonary vascular congestion with probable mild pulmonary edema, increased bibasilar pleural effusion and atelectasis.  Repeat echocardiogram obtained on 12/04/2018 showed mild LVH, EF 50 to 55%, moderate to severe functional eccentric mitral regurgitation, PA peak pressure 79 mmHg, small pericardial effusion noted posterior to the heart, left pleural effusion.  His nephrologist switched his 40 mg daily of Lasix to 20 mg of torsemide.  I previously saw the patient on 12/23/2018, he was complaining of increasing shortness of breath and trouble laying down at the time.  After discussing with Dr. Tresa Endo, I opted to increase his torsemide to 20 mg twice daily.  Follow-up lab work shows worsening renal function.  Therefore I decreased torsemide to 20 mg  daily.  He presents today for cardiology office visit, he is still short of breath every time he lays down or with minimal exertion.  I discussed the case with Dr. Tresa EndoKelly again, we will proceed with left and right heart cath.  However, we will need to make sure his creatinine dropped below 2.0 before proceeding with the procedure.  I asked the patient  to hold torsemide tomorrow and Saturday before restarting on Sunday.  He will obtain lab work on Monday, if renal function is stable at that time, then we will proceed with left and  right cardiac catheterization.  If renal function is significantly down, we may have to consider overnight hydration prior to cardiac catheterization.  His nephrologist recommended 1200 mg of Mucomyst prior to the cardiac cath to help decrease probability of contrast nephropathy.  I did discuss with him risk and benefit of the procedure, he is willing to proceed once his renal function improves more.  He did mention there is a possibility that he may require eye surgery in the future, he has a history of retinal bleeding, however no issue.  I will defer to Dr. Tresa EndoKelly to decide if bare-metal stent will be more appropriate in this patient.   Past Medical History:  Diagnosis Date  . ANEMIA-NOS 07/10/2007  . CORONARY ARTERY DISEASE 07/10/2007  . DIABETES MELLITUS, TYPE I 07/10/2007  . HYPERLIPIDEMIA 07/10/2007  . HYPERTENSION 07/10/2007  . Peripheral arterial disease (HCC)   . 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 RENAL INSUFFICIENCY 07/10/2007    Past Surgical History:  Procedure Laterality Date  . APPENDECTOMY    . CARDIAC CATHETERIZATION     stenting in the vein graft to the right coronary artery and had_DES 3x 26 mm Integrity stent inserted, post dilated to 3.4 mm  . CAROTID ENDARTERECTOMY Right    right  . CHOLECYSTECTOMY N/A 09/13/2017   Procedure: LAPAROSCOPIC CHOLECYSTECTOMY;  Surgeon: Kinsinger, De BlanchLuke Aaron, MD;  Location: [facility];  Service: General;  Laterality: N/A;  . CORONARY ARTERY BYPASS GRAFT  02/2010   PTCA of his right coroary artery.   . ESOPHAGOGASTRODUODENOSCOPY N/A 09/11/2017   Procedure: ESOPHAGOGASTRODUODENOSCOPY (EGD);  Surgeon: Hilarie FredricksonPerry, John N, MD;  [facility];  Service: Endoscopy;  Laterality: N/A;    Current Medications: Outpatient Medications Prior to Visit    Medication Sig Dispense Refill  . acetaminophen (TYLENOL) 500 MG tablet Take 500 mg by mouth daily as needed (pain).    Marland Kitchen. amLODipine (NORVASC) 2.5 MG tablet Take 2.5 mg by mouth daily.     Marland Kitchen. aspirin EC 81 MG tablet Take 81 mg by mouth daily.    . B Complex-C-Folic Acid (FOLBEE PLUS) TABS Take 1 tablet by mouth daily.      . Cholecalciferol (VITAMIN D) 2000 UNITS CAPS Take 2,000 Units by mouth daily.     Marland Kitchen. gabapentin (NEURONTIN) 100 MG capsule Take 100 mg by mouth at bedtime.     . insulin NPH-regular Human (NOVOLIN 70/30) (70-30) 100 UNIT/ML injection 41 units with breakfast and 25 units with supper 20 mL 11  . isosorbide mononitrate (IMDUR) 60 MG 24 hr tablet TAKE 1 TABLET BY MOUTH DAILY 30 tablet 9  . metoprolol succinate (TOPROL-XL) 25 MG 24 hr tablet 2 (two) times daily.    Marland Kitchen. omeprazole (PRILOSEC OTC) 20 MG tablet Take 20 mg by mouth daily.    Marland Kitchen. torsemide (DEMADEX) 20 MG tablet Take 1 tablet (20 mg total) by mouth 2 (two) times daily. 60 tablet 2  No facility-administered medications prior to visit.      Allergies:   Hydralazine; Penicillins; Amlodipine besylate; Atorvastatin; Codeine; Contrast media [iodinated diagnostic agents]; Lipitor [atorvastatin calcium]; Other; Valsartan; and Zetia [ezetimibe]   Social History   Socioeconomic History  . Marital status: Married    Spouse name: Not on file  . Number of children: Not on file  . Years of education: BS  . Highest education level: Not on file  Occupational History  . Occupation: retired    Comment: 14 yrs ago  Social Needs  . Financial resource strain: Not on file  . Food insecurity:    Worry: Not on file    Inability: Not on file  . Transportation needs:    Medical: Not on file    Non-medical: Not on file  Tobacco Use  . Smoking status: Former Games developermoker  . Smokeless tobacco: Never Used  Substance and Sexual Activity  . Alcohol use: No  . Drug use: No  . Sexual activity: Not on file  Lifestyle  . Physical activity:     Days per week: Not on file    Minutes per session: Not on file  . Stress: Not on file  Relationships  . Social connections:    Talks on phone: Not on file    Gets together: Not on file    Attends religious service: Not on file    Active member of club or organization: Not on file    Attends meetings of clubs or organizations: Not on file    Relationship status: Not on file  Other Topics Concern  . Not on file  Social History Narrative   Says his diet and exercise are very good   Married lives with wife     Family History:  The patient's family history includes Heart disease in his mother.   ROS:   Please see the history of present illness.    ROS All other systems reviewed and are negative.   PHYSICAL EXAM:   VS:  BP 118/62   Pulse 62   Ht 5\' 10"  (1.778 m)   Wt 166 lb 3.2 oz (75.4 kg)   BMI 23.85 kg/m    GEN: Well nourished, well developed, in no acute distress  HEENT: normal  Neck: no JVD, carotid bruits, or masses Cardiac: RRR; no rubs, or gallops. 1+ ankle edema  3/6 systolic murmur in the mitral area Respiratory:  clear to auscultation bilaterally, normal work of breathing GI: soft, nontender, nondistended, + BS MS: no deformity or atrophy  Skin: warm and dry, no rash Neuro:  Alert and Oriented x 3, Strength and sensation are intact Psych: euthymic mood, full affect  Wt Readings from Last 3 Encounters:  01/02/19 166 lb 3.2 oz (75.4 kg)  12/24/18 164 lb 6.4 oz (74.6 kg)  11/27/18 167 lb 9.6 oz (76 kg)      Studies/Labs Reviewed:   EKG:  EKG is not ordered today.    Recent Labs: 07/27/2018: Hemoglobin 10.5; Platelets 224 08/13/2018: TSH 2.37 11/29/2018: ALT 21; BNP 700.9 12/30/2018: BUN 35; Creatinine, Ser 2.21; Potassium 4.9; Sodium 138   Lipid Panel    Component Value Date/Time   CHOL 142 11/29/2018 0858   TRIG 81 11/29/2018 0858   HDL 36 (L) 11/29/2018 0858   CHOLHDL 3.9 11/29/2018 0858   CHOLHDL 5.4 07/27/2018 0459   VLDL 25 07/27/2018 0459    LDLCALC 90 11/29/2018 0858    Additional studies/ records that were reviewed today include:  Echo 12/04/2018 LV EF: 50% -   55% Study Conclusions  - Left ventricle: The cavity size was normal. Wall thickness was   increased in a pattern of mild LVH. Systolic function was normal.   The estimated ejection fraction was in the range of 50% to 55%.   Wall motion was normal; there were no regional wall motion   abnormalities. Doppler parameters are consistent with restrictive   physiology, indicative of decreased left ventricular diastolic   compliance and/or increased left atrial pressure. Doppler   parameters are consistent with high ventricular filling pressure. - Aortic valve: Valve area (VTI): 1.81 cm^2. Valve area (Vmax):   1.57 cm^2. Valve area (Vmean): 1.4 cm^2. - Mitral valve: There was moderate to severe functional ecentric   regurgitation, PISA 9mm howevere the PV&'s were not evaluated for   reflux. Valve area by continuity equation (using LVOT flow): 1.7   cm^2. - Pulmonary arteries: PA peak pressure: 79 mm Hg (S). - Pericardium, extracardiac: A small pericardial effusion was   identified posterior to the heart and along the right atrial free   wall. There was a left pleural effusion. - Impressions: The right ventricular systolic pressure was   increased consistent with severe pulmonary hypertension.  Impressions:  - The right ventricular systolic pressure was increased consistent   with severe pulmonary hypertension.    ASSESSMENT:    1. Shortness of breath   2. Acute on chronic renal insufficiency   3. IDDM (insulin dependent diabetes mellitus) (HCC)   4. Essential hypertension   5. Hyperlipidemia LDL goal <70   6. Coronary artery disease involving native coronary artery of native heart with angina pectoris (HCC)      PLAN:  In order of problems listed above:  1. Worsening dyspnea: I try to uptitrate his torsemide to 20 mg twice daily, however this did  not improve his symptoms.  Given the recent worsening mitral regurgitation seen on echocardiogram and EKG changes, I discussed it with Dr. Tresa Endo, we plan to proceed with left and right heart cath.  However this will need to wait until his renal function improve.  I instructed the patient to hold torsemide for 2 days before restarting on Sunday, we plan to obtain lab work on Monday, if renal function is stable, I plan to set the patient up for cardiac catheterization next Wednesday with Dr. Tresa Endo is in the Cath Lab.  Note, patient is worried about his prior history of retinal bleeding, I will defer to Dr. Tresa Endo to consider if he would like to use a bare-metal stenting instead of drug-eluting stent to minimize the duration of dual antiplatelet therapy  -Obtain CT of chest to rule out other secondary causes for shortness of breath  2. CAD: Although he denies any recent chest pain, however his worsening dyspnea is concerning for anginal equivalent.  3. Mitral regurgitation: Although I suspected severe MR in the beginning of 2019 based on physical exam at the time, echocardiogram at the time did not support this diagnosis.  Most recent echocardiogram however does confirm moderate to severe MR.  I wonder if his recent symptom is due to severe MR, therefore we will proceed with left and right heart cath.  There is a question whether or not his severe MR is ischemic in nature.  4. Acute on chronic renal insufficiency: Change torsemide to 20 mg daily, hold diuretic for 2 days before restarting on Sunday.  Basic metabolic panel next Monday  5. Insulin dependent diabetes mellitus: Managed  by primary care provider  6. Hypertension: Blood pressure stable on current therapy  7. Hyperlipidemia: Continue statin    Medication Adjustments/Labs and Tests Ordered: Current medicines are reviewed at length with the patient today.  Concerns regarding medicines are outlined above.  Medication changes, Labs and Tests  ordered today are listed in the Patient Instructions below. Patient Instructions  Medication Instructions:  HOLD torsemide the remainder of today, all day tomorrow, all day Saturday.  RESUME torsemide on Sunday - take 1 tablet daily (instead of 2)  If you need a refill on your cardiac medications before your next appointment, please call your pharmacy.   Lab work: BMET on Tuesday January 14 If you have labs (blood work) drawn today and your tests are completely normal, you will receive your results only by: Marland Kitchen MyChart Message (if you have MyChart) OR . A paper copy in the mail If you have any lab test that is abnormal or we need to change your treatment, we will call you to review the results.  Testing/Procedures: Chest CT today @ [facility] ([address])  Follow-Up: At [facility], you and your health needs are our priority.  As part of our continuing mission to provide you with exceptional heart care, we have created designated Provider Care Teams.  These Care Teams include your primary Cardiologist (physician) and Advanced Practice Providers (APPs -  Physician Assistants and Nurse Practitioners) who all work together to provide you with the care you need, when you need it.  You will need a follow up appointment in 1 month with [facility]   You will need a follow up appointment in 3 months with Dr. Tresa Endo.  Please call our office 2 months in advance to schedule this appointment.  The following Advanced Practice Providers are on your designated Care Team: Azalee Course, [place] . Micah Flesher, PA-C  Any Other Special Instructions Will Be Listed Below (If Applicable).       Ramond Dial, [place]  01/04/2019 11:45 PM    [facility] [address] [place], [place], [place]  [address] Phone: [phone]; Fax: [phone]

## 2019-01-03 ENCOUNTER — Other Ambulatory Visit: Payer: Self-pay

## 2019-01-03 DIAGNOSIS — N183 Chronic kidney disease, stage 3 unspecified: Secondary | ICD-10-CM

## 2019-01-03 DIAGNOSIS — N170 Acute kidney failure with tubular necrosis: Secondary | ICD-10-CM

## 2019-01-03 NOTE — Progress Notes (Signed)
Patient aware to have labs Monday.

## 2019-01-03 NOTE — Progress Notes (Signed)
Small amount of fluid outside lung on both side. However nothing to explain the recent symptom. I discussed with Dr. Tresa Endo, we plan to proceed with left and right heart cath once kidney function improve. Dr. Tresa Endo is in the cath lab next Wednesday, instead of plan to obtain BMET next Tue, I recommend obtain the lab next Monday so if we can potentially set up the cath next Wednesday

## 2019-01-04 ENCOUNTER — Encounter: Payer: Self-pay | Admitting: Physician Assistant

## 2019-01-06 DIAGNOSIS — N189 Chronic kidney disease, unspecified: Secondary | ICD-10-CM | POA: Diagnosis not present

## 2019-01-06 DIAGNOSIS — N289 Disorder of kidney and ureter, unspecified: Secondary | ICD-10-CM | POA: Diagnosis not present

## 2019-01-06 LAB — BASIC METABOLIC PANEL
BUN/Creatinine Ratio: 18 (ref 10–24)
BUN: 38 mg/dL — ABNORMAL HIGH (ref 8–27)
CO2: 19 mmol/L — AB (ref 20–29)
Calcium: 9 mg/dL (ref 8.6–10.2)
Chloride: 102 mmol/L (ref 96–106)
Creatinine, Ser: 2.11 mg/dL — ABNORMAL HIGH (ref 0.76–1.27)
GFR calc Af Amer: 34 mL/min/{1.73_m2} — ABNORMAL LOW (ref >59)
GFR calc non Af Amer: 29 mL/min/{1.73_m2} — ABNORMAL LOW (ref >59)
GLUCOSE: 229 mg/dL — AB (ref 65–99)
Potassium: 4.4 mmol/L (ref 3.5–5.2)
Sodium: 139 mmol/L (ref 134–144)

## 2019-01-07 ENCOUNTER — Telehealth: Payer: Self-pay | Admitting: Physician Assistant

## 2019-01-07 NOTE — Telephone Encounter (Signed)
Tried to reach out the patient to discuss lab result, but phone is busy. Will attempt again tomorrow.

## 2019-01-08 ENCOUNTER — Telehealth: Payer: Self-pay | Admitting: Physician Assistant

## 2019-01-08 NOTE — Telephone Encounter (Signed)
Noted  

## 2019-01-08 NOTE — Progress Notes (Signed)
I have called the patient and instructed him to continue on current daily dosing of diuretic. He apparently had quite significant volume overload after holding torsemide for only 2 days this past weekend.

## 2019-01-08 NOTE — Telephone Encounter (Signed)
I will contact the patient around 1PM today to discuss lab result

## 2019-01-08 NOTE — Telephone Encounter (Signed)
New Message    Returning phone call about lab results.

## 2019-01-09 DIAGNOSIS — N390 Urinary tract infection, site not specified: Secondary | ICD-10-CM | POA: Diagnosis not present

## 2019-01-09 DIAGNOSIS — I10 Essential (primary) hypertension: Secondary | ICD-10-CM | POA: Diagnosis not present

## 2019-01-09 DIAGNOSIS — N183 Chronic kidney disease, stage 3 (moderate): Secondary | ICD-10-CM | POA: Diagnosis not present

## 2019-01-09 DIAGNOSIS — E559 Vitamin D deficiency, unspecified: Secondary | ICD-10-CM | POA: Diagnosis not present

## 2019-01-10 NOTE — Telephone Encounter (Signed)
Patient called, stating that he had the information you needed from [facility]. Patient states he faxed it yesterday. If it is here I will place in your box, unless you would like to see it before then and I can fax it to your attention.  Thanks!

## 2019-01-12 NOTE — Telephone Encounter (Signed)
Thanks Raynelle Fanning. Will take a look

## 2019-01-14 ENCOUNTER — Telehealth: Payer: Self-pay | Admitting: Physician Assistant

## 2019-01-14 NOTE — Telephone Encounter (Signed)
Returned call to patient.He stated he is upset cardiac cath has not been scheduled yet.Advised Azalee Course PA waiting to review labs from kidney Dr.Stated he has been waiting too long.Advised I will send message to [facility] PA.

## 2019-01-14 NOTE — Telephone Encounter (Signed)
New Message    Patient wants to know what's the status of him getting Catherization done.

## 2019-01-16 ENCOUNTER — Other Ambulatory Visit: Payer: Self-pay | Admitting: Physician Assistant

## 2019-01-16 ENCOUNTER — Encounter: Payer: Self-pay | Admitting: Cardiology

## 2019-01-16 DIAGNOSIS — I10 Essential (primary) hypertension: Secondary | ICD-10-CM | POA: Diagnosis not present

## 2019-01-16 DIAGNOSIS — N183 Chronic kidney disease, stage 3 (moderate): Secondary | ICD-10-CM | POA: Diagnosis not present

## 2019-01-16 NOTE — Telephone Encounter (Signed)
Henry [name] will discuss this matter with patient at todays office visit. (per Henry [name])

## 2019-01-17 ENCOUNTER — Telehealth: Payer: Self-pay | Admitting: Physician Assistant

## 2019-01-17 NOTE — Telephone Encounter (Signed)
Discussed with the patient regarding the current plan.  The plan was for the patient to undergo left and right heart cath, this procedure was delayed multiple times due to his poor renal function.  I discussed the case with his nephrologist who recommended Mucomyst treatment 2 hours prior to the cardiac catheterization and a 12 hours afterward.  I talked with Dr. Tresa Endo as well who is okay to proceed with cardiac catheterization at this point.  He understands the potential risk of contrast nephropathy which may lead to worsening renal function and willing to proceed despite knowing the risk.  I have arranged for the cardiac catheterization to be done on Monday.  He had lab work done by his nephrologist on 01/17/2019.  We will try to track down the lab work.  He may ended up needing a repeat CBC in the morning of cardiac catheterization.  Since his renal function is likely at baseline, Dr. Tresa Endo is okay with him arrived 4 hours early on Monday morning for IV hydration.   Monday morning's procedure will be diagnostic cath only without intervention, Dr. Tresa Endo recommend staged PCI on a different day if needed given his renal function.  Patient also has history of bleeding behind his eye and is quite concerned about using dual antiplatelet therapy long-term, he is agreeable to use short-term antiplatelet therapy.  May need to consider bare-metal stent if needed.  Also the purpose of the procedure is to assess the degree of mitral regurgitation as well.  Risk and benefit of procedure explained to the patient who display clear understanding and agree to proceed.  Discussed with patient possible procedural risk include bleeding, vascular injury, renal injury, arrythmia, MI, stroke and loss of limb or life.   I have also discussed the case with clinical pharmacist in the hospital as well to help dose the Mucomyst.  We will also do sodium bicarb as well.  His nephrologist strongly recommend Mucomyst either  way.   Ramond Dial PA Pager: [phone]   His lab has been reviewed received from [facility], date of the lab is 01/16/2019. Glucose 156 BUN 39 Creatinine 2.35 BUN to creatinine ratio 17 GFR for non-African-American 26 Sodium 139, potassium 4.6 Chloride 102 Carbon dioxide total 21 Calcium 9.1 Uric acid 10.6  Will need CBC in AM of cath

## 2019-01-20 ENCOUNTER — Other Ambulatory Visit: Payer: Self-pay

## 2019-01-20 ENCOUNTER — Other Ambulatory Visit: Payer: Self-pay | Admitting: Physician Assistant

## 2019-01-20 ENCOUNTER — Ambulatory Visit: Payer: Medicare Other | Admitting: Endocrinology

## 2019-01-20 ENCOUNTER — Inpatient Hospital Stay (HOSPITAL_COMMUNITY)
Admission: RE | Admit: 2019-01-20 | Discharge: 2019-02-23 | DRG: 286 | Disposition: E | Payer: Medicare Other | Attending: Internal Medicine | Admitting: Internal Medicine

## 2019-01-20 ENCOUNTER — Inpatient Hospital Stay (HOSPITAL_COMMUNITY): Admission: RE | Disposition: E | Payer: Self-pay | Source: Home / Self Care | Attending: Internal Medicine

## 2019-01-20 DIAGNOSIS — I340 Nonrheumatic mitral (valve) insufficiency: Secondary | ICD-10-CM | POA: Diagnosis present

## 2019-01-20 DIAGNOSIS — I480 Paroxysmal atrial fibrillation: Secondary | ICD-10-CM | POA: Diagnosis not present

## 2019-01-20 DIAGNOSIS — E875 Hyperkalemia: Secondary | ICD-10-CM | POA: Diagnosis present

## 2019-01-20 DIAGNOSIS — N183 Chronic kidney disease, stage 3 unspecified: Secondary | ICD-10-CM | POA: Diagnosis present

## 2019-01-20 DIAGNOSIS — E1142 Type 2 diabetes mellitus with diabetic polyneuropathy: Secondary | ICD-10-CM | POA: Diagnosis not present

## 2019-01-20 DIAGNOSIS — I130 Hypertensive heart and chronic kidney disease with heart failure and stage 1 through stage 4 chronic kidney disease, or unspecified chronic kidney disease: Principal | ICD-10-CM | POA: Diagnosis present

## 2019-01-20 DIAGNOSIS — E1042 Type 1 diabetes mellitus with diabetic polyneuropathy: Secondary | ICD-10-CM | POA: Diagnosis present

## 2019-01-20 DIAGNOSIS — IMO0002 Reserved for concepts with insufficient information to code with codable children: Secondary | ICD-10-CM | POA: Diagnosis present

## 2019-01-20 DIAGNOSIS — E1065 Type 1 diabetes mellitus with hyperglycemia: Secondary | ICD-10-CM | POA: Diagnosis present

## 2019-01-20 DIAGNOSIS — E103599 Type 1 diabetes mellitus with proliferative diabetic retinopathy without macular edema, unspecified eye: Secondary | ICD-10-CM | POA: Diagnosis present

## 2019-01-20 DIAGNOSIS — E1051 Type 1 diabetes mellitus with diabetic peripheral angiopathy without gangrene: Secondary | ICD-10-CM | POA: Diagnosis present

## 2019-01-20 DIAGNOSIS — Z951 Presence of aortocoronary bypass graft: Secondary | ICD-10-CM | POA: Diagnosis not present

## 2019-01-20 DIAGNOSIS — J449 Chronic obstructive pulmonary disease, unspecified: Secondary | ICD-10-CM | POA: Diagnosis present

## 2019-01-20 DIAGNOSIS — I739 Peripheral vascular disease, unspecified: Secondary | ICD-10-CM | POA: Diagnosis present

## 2019-01-20 DIAGNOSIS — E785 Hyperlipidemia, unspecified: Secondary | ICD-10-CM | POA: Diagnosis present

## 2019-01-20 DIAGNOSIS — N179 Acute kidney failure, unspecified: Secondary | ICD-10-CM | POA: Diagnosis not present

## 2019-01-20 DIAGNOSIS — E1029 Type 1 diabetes mellitus with other diabetic kidney complication: Secondary | ICD-10-CM | POA: Diagnosis present

## 2019-01-20 DIAGNOSIS — I25119 Atherosclerotic heart disease of native coronary artery with unspecified angina pectoris: Secondary | ICD-10-CM | POA: Diagnosis present

## 2019-01-20 DIAGNOSIS — I252 Old myocardial infarction: Secondary | ICD-10-CM

## 2019-01-20 DIAGNOSIS — Z794 Long term (current) use of insulin: Secondary | ICD-10-CM

## 2019-01-20 DIAGNOSIS — E1022 Type 1 diabetes mellitus with diabetic chronic kidney disease: Secondary | ICD-10-CM | POA: Diagnosis present

## 2019-01-20 DIAGNOSIS — I5033 Acute on chronic diastolic (congestive) heart failure: Secondary | ICD-10-CM | POA: Diagnosis present

## 2019-01-20 DIAGNOSIS — I2510 Atherosclerotic heart disease of native coronary artery without angina pectoris: Secondary | ICD-10-CM | POA: Diagnosis present

## 2019-01-20 DIAGNOSIS — Z515 Encounter for palliative care: Secondary | ICD-10-CM | POA: Diagnosis present

## 2019-01-20 DIAGNOSIS — R0600 Dyspnea, unspecified: Secondary | ICD-10-CM | POA: Diagnosis present

## 2019-01-20 DIAGNOSIS — I272 Pulmonary hypertension, unspecified: Secondary | ICD-10-CM | POA: Diagnosis present

## 2019-01-20 DIAGNOSIS — Z66 Do not resuscitate: Secondary | ICD-10-CM | POA: Diagnosis not present

## 2019-01-20 DIAGNOSIS — Z9089 Acquired absence of other organs: Secondary | ICD-10-CM

## 2019-01-20 DIAGNOSIS — J9691 Respiratory failure, unspecified with hypoxia: Secondary | ICD-10-CM | POA: Diagnosis present

## 2019-01-20 DIAGNOSIS — I5043 Acute on chronic combined systolic (congestive) and diastolic (congestive) heart failure: Secondary | ICD-10-CM | POA: Diagnosis present

## 2019-01-20 DIAGNOSIS — I10 Essential (primary) hypertension: Secondary | ICD-10-CM | POA: Diagnosis present

## 2019-01-20 DIAGNOSIS — I425 Other restrictive cardiomyopathy: Secondary | ICD-10-CM | POA: Diagnosis present

## 2019-01-20 DIAGNOSIS — Z79899 Other long term (current) drug therapy: Secondary | ICD-10-CM

## 2019-01-20 DIAGNOSIS — Z7982 Long term (current) use of aspirin: Secondary | ICD-10-CM

## 2019-01-20 DIAGNOSIS — I509 Heart failure, unspecified: Secondary | ICD-10-CM | POA: Diagnosis not present

## 2019-01-20 DIAGNOSIS — R570 Cardiogenic shock: Secondary | ICD-10-CM | POA: Diagnosis not present

## 2019-01-20 DIAGNOSIS — Z8249 Family history of ischemic heart disease and other diseases of the circulatory system: Secondary | ICD-10-CM

## 2019-01-20 DIAGNOSIS — Z87891 Personal history of nicotine dependence: Secondary | ICD-10-CM

## 2019-01-20 HISTORY — PX: RIGHT/LEFT HEART CATH AND CORONARY/GRAFT ANGIOGRAPHY: CATH118267

## 2019-01-20 LAB — POCT I-STAT 7, (LYTES, BLD GAS, ICA,H+H)
Acid-base deficit: 2 mmol/L (ref 0.0–2.0)
Bicarbonate: 21.9 mmol/L (ref 20.0–28.0)
CALCIUM ION: 1.14 mmol/L — AB (ref 1.15–1.40)
HCT: 35 % — ABNORMAL LOW (ref 39.0–52.0)
Hemoglobin: 11.9 g/dL — ABNORMAL LOW (ref 13.0–17.0)
O2 Saturation: 88 %
PH ART: 7.408 (ref 7.350–7.450)
Potassium: 3.4 mmol/L — ABNORMAL LOW (ref 3.5–5.1)
Sodium: 143 mmol/L (ref 135–145)
TCO2: 23 mmol/L (ref 22–32)
pCO2 arterial: 34.7 mmHg (ref 32.0–48.0)
pO2, Arterial: 53 mmHg — ABNORMAL LOW (ref 83.0–108.0)

## 2019-01-20 LAB — POCT I-STAT EG7
Acid-base deficit: 1 mmol/L (ref 0.0–2.0)
Bicarbonate: 23.9 mmol/L (ref 20.0–28.0)
Calcium, Ion: 1.19 mmol/L (ref 1.15–1.40)
HCT: 35 % — ABNORMAL LOW (ref 39.0–52.0)
Hemoglobin: 11.9 g/dL — ABNORMAL LOW (ref 13.0–17.0)
O2 Saturation: 48 %
POTASSIUM: 3.5 mmol/L (ref 3.5–5.1)
Sodium: 142 mmol/L (ref 135–145)
TCO2: 25 mmol/L (ref 22–32)
pCO2, Ven: 39.3 mmHg — ABNORMAL LOW (ref 44.0–60.0)
pH, Ven: 7.392 (ref 7.250–7.430)
pO2, Ven: 26 mmHg — CL (ref 32.0–45.0)

## 2019-01-20 LAB — CBC
HCT: 35.8 % — ABNORMAL LOW (ref 39.0–52.0)
Hemoglobin: 11.3 g/dL — ABNORMAL LOW (ref 13.0–17.0)
MCH: 28.6 pg (ref 26.0–34.0)
MCHC: 31.6 g/dL (ref 30.0–36.0)
MCV: 90.6 fL (ref 80.0–100.0)
NRBC: 0 % (ref 0.0–0.2)
Platelets: 243 10*3/uL (ref 150–400)
RBC: 3.95 MIL/uL — ABNORMAL LOW (ref 4.22–5.81)
RDW: 13.8 % (ref 11.5–15.5)
WBC: 7.9 10*3/uL (ref 4.0–10.5)

## 2019-01-20 LAB — BASIC METABOLIC PANEL
Anion gap: 11 (ref 5–15)
BUN: 28 mg/dL — ABNORMAL HIGH (ref 8–23)
CO2: 17 mmol/L — ABNORMAL LOW (ref 22–32)
Calcium: 8.8 mg/dL — ABNORMAL LOW (ref 8.9–10.3)
Chloride: 109 mmol/L (ref 98–111)
Creatinine, Ser: 2.02 mg/dL — ABNORMAL HIGH (ref 0.61–1.24)
GFR calc Af Amer: 36 mL/min — ABNORMAL LOW (ref >60)
GFR calc non Af Amer: 31 mL/min — ABNORMAL LOW (ref >60)
Glucose, Bld: 349 mg/dL — ABNORMAL HIGH (ref 70–99)
Potassium: 4 mmol/L (ref 3.5–5.1)
SODIUM: 137 mmol/L (ref 135–145)

## 2019-01-20 LAB — GLUCOSE, CAPILLARY
GLUCOSE-CAPILLARY: 209 mg/dL — AB (ref 70–99)
Glucose-Capillary: 319 mg/dL — ABNORMAL HIGH (ref 70–99)
Glucose-Capillary: 346 mg/dL — ABNORMAL HIGH (ref 70–99)
Glucose-Capillary: 218 mg/dL — ABNORMAL HIGH (ref 70–99)
Glucose-Capillary: 132 mg/dL — ABNORMAL HIGH (ref 70–99)
Glucose-Capillary: 274 mg/dL — ABNORMAL HIGH (ref 70–99)

## 2019-01-20 SURGERY — RIGHT/LEFT HEART CATH AND CORONARY/GRAFT ANGIOGRAPHY
Anesthesia: LOCAL

## 2019-01-20 MED ORDER — VITAMIN D 50 MCG (2000 UT) PO CAPS: 2000.0000 [IU] | ORAL_CAPSULE | Freq: Every day | ORAL | Status: DC

## 2019-01-20 MED ORDER — ACETAMINOPHEN 325 MG PO TABS: 650.0000 mg | ORAL_TABLET | ORAL | Status: DC | PRN

## 2019-01-20 MED ORDER — SODIUM CHLORIDE 0.9% FLUSH: 3.0000 mL | INTRAVENOUS | Status: DC | PRN

## 2019-01-20 MED ORDER — MIDAZOLAM HCL 2 MG/2ML IJ SOLN
INTRAMUSCULAR | Status: DC | PRN
  Administered 2019-01-20: 1 mg via INTRAVENOUS

## 2019-01-20 MED ORDER — SODIUM CHLORIDE 0.9 % WEIGHT BASED INFUSION: 1.0000 mL/kg/h | INTRAVENOUS | Status: DC

## 2019-01-20 MED ORDER — LIDOCAINE HCL (PF) 1 % IJ SOLN
INTRAMUSCULAR | Status: AC
  Filled 2019-01-20: qty 30

## 2019-01-20 MED ORDER — SODIUM CHLORIDE 0.9 % WEIGHT BASED INFUSION
3.0000 mL/kg/h | INTRAVENOUS | Status: DC
  Administered 2019-01-20: 3 mL/kg/h via INTRAVENOUS

## 2019-01-20 MED ORDER — SODIUM CHLORIDE 0.9 % IV SOLN: 250.0000 mL | INTRAVENOUS | Status: DC | PRN

## 2019-01-20 MED ORDER — ASPIRIN 81 MG PO CHEW: 81.0000 mg | CHEWABLE_TABLET | ORAL | Status: DC

## 2019-01-20 MED ORDER — FENTANYL CITRATE (PF) 100 MCG/2ML IJ SOLN
INTRAMUSCULAR | Status: AC
  Filled 2019-01-20: qty 2

## 2019-01-20 MED ORDER — FUROSEMIDE 10 MG/ML IJ SOLN
INTRAMUSCULAR | Status: AC
  Filled 2019-01-20: qty 8

## 2019-01-20 MED ORDER — SODIUM CHLORIDE 0.9% FLUSH
3.0000 mL | Freq: Two times a day (BID) | INTRAVENOUS | Status: DC
  Administered 2019-01-21: 3 mL via INTRAVENOUS

## 2019-01-20 MED ORDER — SODIUM CHLORIDE 0.9% FLUSH: 3.0000 mL | Freq: Two times a day (BID) | INTRAVENOUS | Status: DC

## 2019-01-20 MED ORDER — FUROSEMIDE 10 MG/ML IJ SOLN
INTRAMUSCULAR | Status: DC | PRN
  Administered 2019-01-20: 80 mg via INTRAVENOUS

## 2019-01-20 MED ORDER — ACETYLCYSTEINE 20 % IN SOLN
1200.0000 mg | Freq: Once | RESPIRATORY_TRACT | Status: DC
  Filled 2019-01-20: qty 8

## 2019-01-20 MED ORDER — IOHEXOL 350 MG/ML SOLN
INTRAVENOUS | Status: DC | PRN
  Administered 2019-01-20: 55 mL via INTRACARDIAC

## 2019-01-20 MED ORDER — ENOXAPARIN SODIUM 40 MG/0.4ML ~~LOC~~ SOLN
40.0000 mg | SUBCUTANEOUS | Status: DC
  Administered 2019-01-20: 40 mg via SUBCUTANEOUS
  Filled 2019-01-20: qty 0.4

## 2019-01-20 MED ORDER — FOLBEE PLUS PO TABS: 1.0000 | ORAL_TABLET | Freq: Every day | ORAL | Status: DC

## 2019-01-20 MED ORDER — FENTANYL CITRATE (PF) 100 MCG/2ML IJ SOLN
INTRAMUSCULAR | Status: DC | PRN
  Administered 2019-01-20: 25 ug via INTRAVENOUS

## 2019-01-20 MED ORDER — ACETYLCYSTEINE 20 % IN SOLN
1200.0000 mg | Freq: Once | RESPIRATORY_TRACT | Status: AC
  Administered 2019-01-20: 1200 mg via ORAL
  Filled 2019-01-20: qty 8

## 2019-01-20 MED ORDER — HEPARIN (PORCINE) IN NACL 1000-0.9 UT/500ML-% IV SOLN
INTRAVENOUS | Status: AC
  Filled 2019-01-20: qty 1000

## 2019-01-20 MED ORDER — ONDANSETRON HCL 4 MG/2ML IJ SOLN
4.0000 mg | Freq: Four times a day (QID) | INTRAMUSCULAR | Status: DC | PRN
  Administered 2019-01-25 (×2): 4 mg via INTRAVENOUS
  Filled 2019-01-20 (×2): qty 2

## 2019-01-20 MED ORDER — HEPARIN (PORCINE) IN NACL 1000-0.9 UT/500ML-% IV SOLN
INTRAVENOUS | Status: DC | PRN
  Administered 2019-01-20 (×2): 500 mL

## 2019-01-20 MED ORDER — POTASSIUM CHLORIDE CRYS ER 20 MEQ PO TBCR
20.0000 meq | EXTENDED_RELEASE_TABLET | Freq: Two times a day (BID) | ORAL | Status: DC
  Administered 2019-01-20 – 2019-01-21 (×3): 20 meq via ORAL
  Filled 2019-01-20 (×3): qty 1

## 2019-01-20 MED ORDER — INSULIN ASPART 100 UNIT/ML ~~LOC~~ SOLN
0.0000 [IU] | Freq: Three times a day (TID) | SUBCUTANEOUS | Status: DC
  Administered 2019-01-20: 11 [IU] via SUBCUTANEOUS
  Administered 2019-01-21: 15 [IU] via SUBCUTANEOUS

## 2019-01-20 MED ORDER — B COMPLEX-C PO TABS
1.0000 | ORAL_TABLET | Freq: Every day | ORAL | Status: DC
  Administered 2019-01-21 – 2019-01-23 (×3): 1 via ORAL
  Filled 2019-01-20 (×5): qty 1

## 2019-01-20 MED ORDER — ENOXAPARIN SODIUM 40 MG/0.4ML ~~LOC~~ SOLN
40.0000 mg | SUBCUTANEOUS | Status: DC
  Administered 2019-01-21: 40 mg via SUBCUTANEOUS
  Filled 2019-01-20: qty 0.4

## 2019-01-20 MED ORDER — HYDRALAZINE HCL 20 MG/ML IJ SOLN
INTRAMUSCULAR | Status: AC
  Filled 2019-01-20: qty 1

## 2019-01-20 MED ORDER — MILRINONE LACTATE IN DEXTROSE 20-5 MG/100ML-% IV SOLN
0.2500 ug/kg/min | INTRAVENOUS | Status: DC
  Administered 2019-01-20 – 2019-01-22 (×4): 0.25 ug/kg/min via INTRAVENOUS
  Filled 2019-01-20: qty 200
  Filled 2019-01-20: qty 100

## 2019-01-20 MED ORDER — LIDOCAINE HCL (PF) 1 % IJ SOLN
INTRAMUSCULAR | Status: DC | PRN
  Administered 2019-01-20: 15 mL

## 2019-01-20 MED ORDER — FUROSEMIDE 10 MG/ML IJ SOLN
80.0000 mg | Freq: Two times a day (BID) | INTRAMUSCULAR | Status: DC
  Administered 2019-01-21 – 2019-01-22 (×3): 80 mg via INTRAVENOUS
  Filled 2019-01-20 (×3): qty 8

## 2019-01-20 MED ORDER — SODIUM BICARBONATE 8.4 % IV SOLN
INTRAVENOUS | Status: AC
  Administered 2019-01-20: 12:00:00 via INTRAVENOUS
  Filled 2019-01-20: qty 1000

## 2019-01-20 MED ORDER — ASPIRIN EC 81 MG PO TBEC
81.0000 mg | DELAYED_RELEASE_TABLET | Freq: Every day | ORAL | Status: DC
  Administered 2019-01-21 – 2019-01-23 (×3): 81 mg via ORAL
  Filled 2019-01-20 (×3): qty 1

## 2019-01-20 MED ORDER — MIDAZOLAM HCL 2 MG/2ML IJ SOLN
INTRAMUSCULAR | Status: AC
  Filled 2019-01-20: qty 2

## 2019-01-20 MED ORDER — GABAPENTIN 100 MG PO CAPS
100.0000 mg | ORAL_CAPSULE | Freq: Every day | ORAL | Status: DC
  Administered 2019-01-20 – 2019-01-22 (×3): 100 mg via ORAL
  Filled 2019-01-20 (×4): qty 1

## 2019-01-20 MED ORDER — SODIUM CHLORIDE 0.9% FLUSH
3.0000 mL | Freq: Two times a day (BID) | INTRAVENOUS | Status: DC
  Administered 2019-01-20 – 2019-01-21 (×2): 3 mL via INTRAVENOUS

## 2019-01-20 MED ORDER — ISOSORBIDE MONONITRATE ER 60 MG PO TB24
60.0000 mg | ORAL_TABLET | Freq: Every day | ORAL | Status: DC
  Administered 2019-01-21 – 2019-01-23 (×3): 60 mg via ORAL
  Filled 2019-01-20 (×3): qty 1

## 2019-01-20 MED ORDER — VITAMIN D 25 MCG (1000 UNIT) PO TABS
2000.0000 [IU] | ORAL_TABLET | Freq: Every day | ORAL | Status: DC
  Administered 2019-01-21 – 2019-01-23 (×3): 2000 [IU] via ORAL
  Filled 2019-01-20 (×5): qty 2

## 2019-01-20 MED ORDER — SODIUM BICARBONATE BOLUS VIA INFUSION
INTRAVENOUS | Status: DC
  Filled 2019-01-20: qty 1

## 2019-01-20 SURGICAL SUPPLY — 11 items
CATH INFINITI 5 FR LCB (CATHETERS) ×2 IMPLANT
CATH INFINITI 5FR MULTPACK ANG (CATHETERS) ×2 IMPLANT
CATH SWAN GANZ 7F STRAIGHT (CATHETERS) ×2 IMPLANT
KIT HEART LEFT (KITS) ×2 IMPLANT
PACK CARDIAC CATHETERIZATION (CUSTOM PROCEDURE TRAY) ×2 IMPLANT
SHEATH PINNACLE 5F 10CM (SHEATH) ×2 IMPLANT
SHEATH PINNACLE 7F 10CM (SHEATH) ×2 IMPLANT
SHEATH PROBE COVER 6X72 (BAG) ×2 IMPLANT
TRANSDUCER W/STOPCOCK (MISCELLANEOUS) ×2 IMPLANT
TUBING CIL FLEX 10 FLL-RA (TUBING) ×2 IMPLANT
WIRE EMERALD 3MM-J .035X150CM (WIRE) ×2 IMPLANT

## 2019-01-20 NOTE — Progress Notes (Signed)
Janne Napoleon, Rn sent text by Clarice Pole, Rn to inform of [facility] reading

## 2019-01-20 NOTE — Progress Notes (Signed)
Spoke with Janne Napoleon, Rn about orders for bicarb and mucomyst. Orders held for now. Victorino Dike to speak with Dr [place] to clarify.

## 2019-01-20 NOTE — Interval H&P Note (Signed)
History and Physical Interval Note:  01/23/2019 1:39 PM  Henry [name].  has presented today for surgery, with the diagnosis of chest pain  The various methods of treatment have been discussed with the patient and family. After consideration of risks, benefits and other options for treatment, the patient has consented to  Procedure(s): RIGHT/LEFT HEART CATH AND CORONARY ANGIOGRAPHY (N/A) as a surgical intervention .  The patient's history has been reviewed, patient examined, no change in status, stable for surgery.  I have reviewed the patient's chart and labs.  Questions were answered to the patient's satisfaction.     Theron Arista [facility] 12/25/2018 1:39 PM

## 2019-01-20 NOTE — Progress Notes (Signed)
[facility] Janne Napoleon called and informed

## 2019-01-20 NOTE — Progress Notes (Addendum)
Spoke with Janne Napoleon, Rn states she spoke with Dr [place] and ok to go ahead with Bicab and Mucomyst  States she will call me when its time to give bicab and draw blood

## 2019-01-20 NOTE — Consult Note (Signed)
Advanced Heart Failure Team Consult Note   Primary Physician: Overton Mamirigliano, Mary K, DO PCP-Cardiologist:  Nicki Guadalajarahomas Kelly, MD  Reason for Consultation: Acute on chronic diastolic CHF/ Pulmonary HTN/ Low output CHF  HPI:    Henry LessGlenn T Ellinwood Jr. is seen today for evaluation of Acute on chronic diastolic CHF/ Pulmonary HTN/ Low output CHF at the request of Dr. SwazilandJordan.   Henry LessGlenn T Behne Jr. is a 78 y.o. male with PMH of type 1 DM, HTN, HLD, PAD, and CAD.  [facility] 01/02/2019. Recently had torsemide increased. He remained orthopneic and SOB with minimal exertion. Torsemide held to give kidneys room for [facility]  Pt presented today for planned [facility] which showed severe 3v disease with no interventional targets, severe pulmonary HTN, and moderately low cardiac output as below.   CHF team asked to see for further evaluation and treatment.   Pertinent labs include Cr 2.02, K 4.0. Other labs pending.   Feeling OK currently. He is usually orthopneic and is surprised he isn't right now. He states his breathing and functional status has gotten gradually worse since August. He has had to start sleeping in a recliner, and is SOB with minimal exertion, including bathing and changing clothes. He lives at home with his wife. He reports compliance with his medications and states his BP runs 130-140s mostly. He denies snoring or history of sleep apnea to his knowledge. He denies exertional chest pain. He feels he had better UOP and less swelling on HCTZ compared to torsemide.   Echo 12/04/2018 LVEF 50-55%, ? Restrictive physiology, Mod/Sev Functional MR, PA peak pressure 79 mm Hg, + Left pleural effusion   [facility] Dec 15, 2019  Ost LM lesion is 85% stenosed.  Prox LAD to Mid LAD lesion is 100% stenosed.  Ost Cx to Prox Cx lesion is 100% stenosed.  Ost RCA to Dist RCA lesion is 100% stenosed.  LIMA graft was visualized by angiography and is normal in caliber.  Origin to Prox Graft lesion before 2nd Mrg is  100% stenosed.  SVG graft was visualized by angiography and is normal in caliber.  Origin to Prox Graft lesion is 100% stenosed.  LV end diastolic pressure is severely elevated.  Hemodynamic findings consistent with severe pulmonary hypertension. Hemodynamics (mmHg) RA mean 19 RV 80/11 PA 82/31 (53) PCWP 34 AO 151/70 Cardiac Output (Fick) 4.19 Cardiac Index (Fick) 2.16 MV sat 48% PVR 5 WU PAPI 2.68  Review of Systems: [y] = yes, [ ]  = no   General: Weight gain [ ] ; Weight loss [ ] ; Anorexia [ ] ; Fatigue [y]; Fever [ ] ; Chills [ ] ; Weakness [ ]   Cardiac: Chest pain/pressure [ ] ; Resting SOB [ ] ; Exertional SOB [y]; Orthopnea [y]; Pedal Edema [y]; Palpitations [ ] ; Syncope [ ] ; Presyncope [ ] ; Paroxysmal nocturnal dyspnea[ ]   Pulmonary: Cough [y]; Wheezing[ ] ; Hemoptysis[ ] ; Sputum [ ] ; Snoring [ ]   GI: Vomiting[ ] ; Dysphagia[ ] ; Melena[ ] ; Hematochezia [ ] ; Heartburn[ ] ; Abdominal pain [ ] ; Constipation [ ] ; Diarrhea [ ] ; BRBPR [ ]   GU: Hematuria[ ] ; Dysuria [ ] ; Nocturia[ ]   Vascular: Pain in legs with walking [ ] ; Pain in feet with lying flat [ ] ; Non-healing sores [ ] ; Stroke [ ] ; TIA [ ] ; Slurred speech [ ] ;  Neuro: Headaches[ ] ; Vertigo[ ] ; Seizures[ ] ; Paresthesias[ ] ;Blurred vision [ ] ; Diplopia [ ] ; Vision changes [ ]   Ortho/Skin: Arthritis [y]; Joint pain [y]; Muscle pain [ ] ; Joint swelling [ ] ;  Back Pain [ ] ; Rash [ ]   Psych: Depression[ ] ; Anxiety[ ]   Heme: Bleeding problems [ ] ; Clotting disorders [ ] ; Anemia [ ]   Endocrine: Diabetes [ ] ; Thyroid dysfunction[ ]   Home Medications Prior to Admission medications   Medication Sig Start Date End Date Taking? Authorizing Provider  acetaminophen (TYLENOL) 500 MG tablet Take 500 mg by mouth every 6 (six) hours as needed for moderate pain or headache.    Yes [provider]  amLODipine (NORVASC) 2.5 MG tablet Take 2.5 mg by mouth daily.  09/18/18  Yes [provider]  aspirin EC 81 MG tablet Take 81 mg by  mouth daily.   Yes [provider]  B Complex-C-Folic Acid (FOLBEE PLUS) TABS Take 1 tablet by mouth daily.     Yes [provider]  Cholecalciferol (VITAMIN D) 2000 UNITS CAPS Take 2,000 Units by mouth daily.    Yes [provider]  gabapentin (NEURONTIN) 100 MG capsule Take 100 mg by mouth at bedtime.    Yes [provider]  insulin NPH-regular Human (NOVOLIN 70/30) (70-30) 100 UNIT/ML injection 41 units with breakfast and 25 units with supper Patient taking differently: Inject 25-41 Units into the skin See admin instructions. Inject 41 units SQ with breakfast and inject 25 units SQ with supper 09/19/18  Yes Romero Belling, MD  isosorbide mononitrate (IMDUR) 60 MG 24 hr tablet TAKE 1 TABLET BY MOUTH DAILY Patient taking differently: Take 60 mg by mouth daily.  05/28/18  Yes Lennette Bihari, MD  metoprolol succinate (TOPROL-XL) 25 MG 24 hr tablet Take 25 mg by mouth 2 (two) times daily.  09/18/18  Yes [provider]  omeprazole (PRILOSEC OTC) 20 MG tablet Take 20 mg by mouth daily.   Yes [provider]  torsemide (DEMADEX) 20 MG tablet Take 1 tablet (20 mg total) by mouth daily. 01/02/19  Yes Azalee Course, PA    Past Medical History: Past Medical History:  Diagnosis Date  . ANEMIA-NOS 07/10/2007  . CORONARY ARTERY DISEASE 07/10/2007  . DIABETES MELLITUS, TYPE I 07/10/2007  . HYPERLIPIDEMIA 07/10/2007  . HYPERTENSION 07/10/2007  . Peripheral arterial disease (HCC)   . 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 RENAL INSUFFICIENCY 07/10/2007    Past Surgical History: Past Surgical History:  Procedure Laterality Date  . APPENDECTOMY    . CARDIAC CATHETERIZATION     stenting in the vein graft to the right coronary artery and had_DES 3x 26 mm Integrity stent inserted, post dilated to 3.4 mm  . CAROTID ENDARTERECTOMY Right    right  . CHOLECYSTECTOMY N/A 09/13/2017   Procedure: LAPAROSCOPIC CHOLECYSTECTOMY;  Surgeon: Kinsinger, De Blanch, MD;  Location: [facility];  Service: General;  Laterality: N/A;  . CORONARY ARTERY BYPASS GRAFT  02/2010   PTCA of his right coroary artery.   . ESOPHAGOGASTRODUODENOSCOPY N/A 09/11/2017   Procedure: ESOPHAGOGASTRODUODENOSCOPY (EGD);  Surgeon: Hilarie Fredrickson, MD;  Location: [facility];  Service: Endoscopy;  Laterality: N/A;    Family History: Family History  Problem Relation Age of Onset  . Heart disease Mother   . Cancer Neg Hx     Social History: Social History   Socioeconomic History  . Marital status: Married    Spouse name: Not on file  . Number of children: Not on file  . Years of education: BS  . Highest education level: Not on file  Occupational History  . Occupation: retired    Comment: 14 yrs ago  Social Needs  .  Financial resource strain: Not on file  . Food insecurity:    Worry: Not on file    Inability: Not on file  . Transportation needs:    Medical: Not on file    Non-medical: Not on file  Tobacco Use  . Smoking status: Former Games developer  . Smokeless tobacco: Never Used  Substance and Sexual Activity  . Alcohol use: No  . Drug use: No  . Sexual activity: Not on file  Lifestyle  . Physical activity:    Days per week: Not on file    Minutes per session: Not on file  . Stress: Not on file  Relationships  . Social connections:    Talks on phone: Not on file    Gets together: Not on file    Attends religious service: Not on file    Active member of club or organization: Not on file    Attends meetings of clubs or organizations: Not on file    Relationship status: Not on file  Other Topics Concern  . Not on file  Social History Narrative   Says his diet and exercise are very good   Married lives with wife    Allergies:  Allergies  Allergen Reactions  . Hydralazine Swelling and Other (See Comments)    Feet swelled so bad they were cracking Severe foot swelling  . Penicillins Other (See Comments)    Per allergy test. (pt states he was never  given penicillin)  . Amlodipine Besylate Swelling  . Atorvastatin Other (See Comments)    Muscle aches/pain.   . Codeine Other (See Comments)    anxiety  . Contrast Media [Iodinated Diagnostic Agents] Other (See Comments)    Kidney issues when given  . Lipitor [Atorvastatin Calcium] Other (See Comments)    myalgia  . Other Other (See Comments)    X ray dye, kidney problem  . Valsartan Other (See Comments) and Swelling    Caused kidney damage  . Zetia [Ezetimibe] Other (See Comments)    Severe dizziness    Objective:    Vital Signs:   Temp:  [97.4 F (36.3 C)] 97.4 F (36.3 C) (01/27 0651) Pulse Rate:  [76-100] 96 (01/27 1638) Resp:  [17-31] 18 (01/27 1638) BP: (155-177)/(80-95) 168/89 (01/27 1638) SpO2:  [88 %-96 %] 89 % (01/27 1638) Weight:  [76.2 kg] 76.2 kg (01/27 0651)    Weight change: Filed Weights   01/14/2019 0651  Weight: 76.2 kg    Intake/Output:  No intake or output data in the 24 hours ending 01/24/2019 1649    Physical Exam    General:  NAD.  HEENT: normal Neck: supple. JVP to ear. Carotids 2+ bilat; no bruits. No lymphadenopathy or thyromegaly appreciated. Cor: PMI nondisplaced. Regular rate & rhythm. 3/6 Henry Lungs: basilar crackles  Abdomen: soft, nontender, + distended. No hepatosplenomegaly. No bruits or masses. Good bowel sounds. Extremities: no cyanosis, clubbing, rash, 3+ edema Neuro: alert & orientedx3, cranial nerves grossly intact. moves all 4 extremities w/o difficulty. Affect pleasant   Telemetry   NSR 60-70s, personally reviewed  EKG    12/24/2018 NSR 66 bpm with TWI in II, III, and AVF with right axis deviation  Labs   Basic Metabolic Panel: Recent Labs  Lab 12/30/2018 1020  NA 137  K 4.0  CL 109  CO2 17*  GLUCOSE 349*  BUN 28*  CREATININE 2.02*  CALCIUM 8.8*    Liver Function Tests: No results for input(s): AST, ALT, ALKPHOS, BILITOT, PROT, ALBUMIN in  the last 168 hours. No results for input(s): LIPASE, AMYLASE in the  last 168 hours. No results for input(s): AMMONIA in the last 168 hours.  CBC: Recent Labs  Lab 01/11/2019 1020  WBC 7.9  HGB 11.3*  HCT 35.8*  MCV 90.6  PLT 243    Cardiac Enzymes: No results for input(s): CKTOTAL, CKMB, CKMBINDEX, TROPONINI in the last 168 hours.  BNP: BNP (last 3 results) Recent Labs    11/29/18 0858  BNP 700.9*    ProBNP (last 3 results) No results for input(s): PROBNP in the last 8760 hours.   CBG: Recent Labs  Lab 01/22/2019 0828 12/28/2018 1003 01/08/2019 1340 01/02/2019 1446  GLUCAP 319* 346* 218* 209*    Coagulation Studies: No results for input(s): LABPROT, INR in the last 72 hours.   Imaging    No results found.   Medications:     Current Medications: . acetylcysteine  1,200 mg Oral Once  . aspirin  81 mg Oral Pre-Cath  . insulin aspart  0-15 Units Subcutaneous TID WC  . sodium bicarbonate   Intravenous Pre-Cath  . sodium chloride flush  3 mL Intravenous Q12H     Infusions: . sodium chloride    . sodium chloride 0.133 mL/kg/hr (12/29/2018 1608)       Patient Profile   Henry LessGlenn T Person Jr. is a 78 y.o. male with PMH of type 1 DM, HTN, HLD, PAD, and CAD.  Admitted from cath lab with acute on chronic diastolic CHF, pulmonary hypertension, and moderately low cardiac output. CHF team asked to consult for further evaluation and treatment.   Assessment/Plan   1. Acute on chronic diastolic CHF with restrictive physiology - Echo 12/04/2018 LVEF 50-55%, ? Restrictive physiology, Mod/Sev Functional MR, PA peak pressure 79 mm Hg, + Left pleural effusion - Volume status overloaded on exam and by cath.  - Agree with 80 mg IV lasix, will start on 80 mg BID and follow renal function closely.  - Would resume home amlodipine, imdur, and toprol for BP control.   2. Pulmonary HTN - Suspect primarily WHO group II and III in setting of prolonged HTN.   - Had normal VQ scan 12/2002, nothing more recent.  - For now will focus on diuresis.    3. CAD - Cath as above.  - No s/s of ischemia.   - Continue ASA and statin  4. CKD III - Baseline Cr runs 2.0 - 2.2  5. Severe Henry  - Mod to severe by Echo 11/2018  6. DM type I - Insulin dependent.   7. HTN - Meds as above.   8. HLD - Per [facility].  - Total Chol 142, TGs 81, HDL 36, LDL 90.  9. Neuropathy - ? From DM, or if could have component of amyloid.   Medication concerns reviewed with patient and pharmacy team. Barriers identified: None at this time.   Length of Stay: 0  Luane SchoolMichael Andrew Tillery, PA-C  01/09/2019, 4:49 PM  Advanced Heart Failure Team Pager [phone] (M-F; 7a - 4p)  Please contact [facility] for night-coverage after hours (4p -7a ) and weekends on [email or web address]   Agree with above.     Henry [name] is a 78 y/o male with h/o CAD s/p CABG, long-standing DM1, severe HTN and CKD 3 whom we are asked to see by Dr. SwazilandJordan after cath revealed markedly elevated filling pressures and low output concerning for cardiogenic shock.   He reports several month h/o progressive DOE and LE edema.  Now NYHA IIIB. Recent echo showed EF 50-55% with restrictive physiology and mild to moderate RV hypokineses. + severe Henry. Underwent cath today with numbers as above. 3/4 bypass grafts are patent. MV sat 48%  On exam Elderly frail. 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 This is complicated by hypoxic respiratory failure and CKD 3. Options very limited. Will admit to ICU. Place PICC (not HD candidate) will diurese with milrinone support. Once fully diuresed will plan TEE to evaluate MV more closely to see if he may be candidate for MitraClip. I quoted him 50% one year mortality. Will send myeloma panel.   CRITICAL CARE Performed by: Arvilla Meres  Total critical care time: 35 minutes  Critical care time was  exclusive of separately billable procedures and treating other patients.  Critical care was necessary to treat or prevent imminent or life-threatening deterioration.  Critical care was time spent personally by me (independent of midlevel providers or residents) on the following activities: development of treatment plan with patient and/or surrogate as well as nursing, discussions with consultants, evaluation of patient's response to treatment, examination of patient, obtaining history from patient or surrogate, ordering and performing treatments and interventions, ordering and review of laboratory studies, ordering and review of radiographic studies, pulse oximetry and re-evaluation of patient's condition.  Arvilla Meres, MD  6:52 PM

## 2019-01-20 NOTE — Progress Notes (Signed)
Site area: Right groin 5 french arterial and 7 french venous sheaths were removed by Shauna Hugh RN  Site Prior to Removal:  Level 0  Pressure Applied For 20 MINUTES    Bedrest Beginning at 1740pm  Manual:   Yes.    Patient Status During Pull:  stable  Post Pull Groin Site:  Level 0  Post Pull Instructions Given:  Yes.    Post Pull Pulses Present:  Yes.    Dressing Applied:  Yes.    Comments:  Dr Gala Romney in to see pt.

## 2019-01-21 ENCOUNTER — Inpatient Hospital Stay (HOSPITAL_COMMUNITY): Payer: Medicare Other

## 2019-01-21 ENCOUNTER — Inpatient Hospital Stay: Payer: Self-pay

## 2019-01-21 ENCOUNTER — Encounter (HOSPITAL_COMMUNITY): Payer: Self-pay | Admitting: Cardiology

## 2019-01-21 DIAGNOSIS — I340 Nonrheumatic mitral (valve) insufficiency: Secondary | ICD-10-CM

## 2019-01-21 DIAGNOSIS — N183 Chronic kidney disease, stage 3 (moderate): Secondary | ICD-10-CM

## 2019-01-21 LAB — BASIC METABOLIC PANEL
Anion gap: 17 — ABNORMAL HIGH (ref 5–15)
BUN: 27 mg/dL — ABNORMAL HIGH (ref 8–23)
CHLORIDE: 101 mmol/L (ref 98–111)
CO2: 18 mmol/L — ABNORMAL LOW (ref 22–32)
Calcium: 8.6 mg/dL — ABNORMAL LOW (ref 8.9–10.3)
Creatinine, Ser: 2.03 mg/dL — ABNORMAL HIGH (ref 0.61–1.24)
GFR calc Af Amer: 36 mL/min — ABNORMAL LOW (ref >60)
GFR calc non Af Amer: 31 mL/min — ABNORMAL LOW (ref >60)
Glucose, Bld: 353 mg/dL — ABNORMAL HIGH (ref 70–99)
Potassium: 4.2 mmol/L (ref 3.5–5.1)
Sodium: 136 mmol/L (ref 135–145)

## 2019-01-21 LAB — CBC WITH DIFFERENTIAL/PLATELET
Abs Immature Granulocytes: 0.04 10*3/uL (ref 0.00–0.07)
Basophils Absolute: 0.1 10*3/uL (ref 0.0–0.1)
Basophils Relative: 1 %
Eosinophils Absolute: 0.1 10*3/uL (ref 0.0–0.5)
Eosinophils Relative: 1 %
HCT: 34.6 % — ABNORMAL LOW (ref 39.0–52.0)
Hemoglobin: 10.9 g/dL — ABNORMAL LOW (ref 13.0–17.0)
IMMATURE GRANULOCYTES: 0 %
LYMPHS PCT: 11 %
Lymphs Abs: 1.1 10*3/uL (ref 0.7–4.0)
MCH: 27.8 pg (ref 26.0–34.0)
MCHC: 31.5 g/dL (ref 30.0–36.0)
MCV: 88.3 fL (ref 80.0–100.0)
Monocytes Absolute: 0.7 10*3/uL (ref 0.1–1.0)
Monocytes Relative: 7 %
NEUTROS ABS: 8 10*3/uL — AB (ref 1.7–7.7)
NEUTROS PCT: 80 %
Platelets: 250 10*3/uL (ref 150–400)
RBC: 3.92 MIL/uL — ABNORMAL LOW (ref 4.22–5.81)
RDW: 13.9 % (ref 11.5–15.5)
WBC: 9.9 10*3/uL (ref 4.0–10.5)
nRBC: 0 % (ref 0.0–0.2)

## 2019-01-21 LAB — GLUCOSE, CAPILLARY
Glucose-Capillary: 334 mg/dL — ABNORMAL HIGH (ref 70–99)
Glucose-Capillary: 341 mg/dL — ABNORMAL HIGH (ref 70–99)
Glucose-Capillary: 396 mg/dL — ABNORMAL HIGH (ref 70–99)
Glucose-Capillary: 369 mg/dL — ABNORMAL HIGH (ref 70–99)
Glucose-Capillary: 389 mg/dL — ABNORMAL HIGH (ref 70–99)
Glucose-Capillary: 306 mg/dL — ABNORMAL HIGH (ref 70–99)
Glucose-Capillary: 221 mg/dL — ABNORMAL HIGH (ref 70–99)

## 2019-01-21 LAB — MRSA PCR SCREENING: MRSA by PCR: NEGATIVE

## 2019-01-21 MED ORDER — INSULIN ASPART PROT & ASPART (70-30 MIX) 100 UNIT/ML ~~LOC~~ SUSP: 5.0000 [IU] | SUBCUTANEOUS | Status: DC

## 2019-01-21 MED ORDER — INSULIN ASPART 100 UNIT/ML ~~LOC~~ SOLN
3.0000 [IU] | Freq: Three times a day (TID) | SUBCUTANEOUS | Status: DC
  Administered 2019-01-21 – 2019-01-23 (×6): 3 [IU] via SUBCUTANEOUS

## 2019-01-21 MED ORDER — INSULIN ASPART PROT & ASPART (70-30 MIX) 100 UNIT/ML ~~LOC~~ SUSP
5.0000 [IU] | Freq: Once | SUBCUTANEOUS | Status: AC
  Administered 2019-01-21: 5 [IU] via SUBCUTANEOUS
  Filled 2019-01-21: qty 10

## 2019-01-21 MED ORDER — CHLORHEXIDINE GLUCONATE CLOTH 2 % EX PADS
6.0000 | MEDICATED_PAD | Freq: Every day | CUTANEOUS | Status: DC
  Administered 2019-01-24: 6 via TOPICAL

## 2019-01-21 MED ORDER — AMLODIPINE BESYLATE 5 MG PO TABS
2.5000 mg | ORAL_TABLET | Freq: Every day | ORAL | Status: DC
  Administered 2019-01-22: 2.5 mg via ORAL
  Filled 2019-01-21: qty 1

## 2019-01-21 MED ORDER — INSULIN ASPART 100 UNIT/ML ~~LOC~~ SOLN
10.0000 [IU] | Freq: Once | SUBCUTANEOUS | Status: AC
  Administered 2019-01-21: 10 [IU] via SUBCUTANEOUS

## 2019-01-21 MED ORDER — SODIUM CHLORIDE 0.9% FLUSH
10.0000 mL | INTRAVENOUS | Status: DC | PRN
  Administered 2019-01-25: 10 mL
  Filled 2019-01-21: qty 40

## 2019-01-21 MED ORDER — AMLODIPINE BESYLATE 5 MG PO TABS
2.5000 mg | ORAL_TABLET | Freq: Every day | ORAL | Status: DC
  Administered 2019-01-21: 2.5 mg via ORAL
  Filled 2019-01-21: qty 1

## 2019-01-21 MED ORDER — INSULIN ASPART 100 UNIT/ML ~~LOC~~ SOLN: 3.0000 [IU] | Freq: Three times a day (TID) | SUBCUTANEOUS | Status: DC

## 2019-01-21 MED ORDER — SODIUM CHLORIDE 0.9% FLUSH
10.0000 mL | Freq: Two times a day (BID) | INTRAVENOUS | Status: DC
  Administered 2019-01-21 (×2): 10 mL

## 2019-01-21 MED ORDER — TRAMADOL HCL 50 MG PO TABS
50.0000 mg | ORAL_TABLET | Freq: Two times a day (BID) | ORAL | Status: DC | PRN
  Administered 2019-01-21: 50 mg via ORAL
  Filled 2019-01-21: qty 1

## 2019-01-21 MED ORDER — INSULIN GLARGINE 100 UNIT/ML ~~LOC~~ SOLN
22.0000 [IU] | Freq: Every day | SUBCUTANEOUS | Status: DC
  Administered 2019-01-21 – 2019-01-22 (×2): 22 [IU] via SUBCUTANEOUS
  Filled 2019-01-21 (×2): qty 0.22

## 2019-01-21 MED ORDER — INSULIN GLARGINE 100 UNIT/ML ~~LOC~~ SOLN
36.0000 [IU] | Freq: Every day | SUBCUTANEOUS | Status: DC
  Filled 2019-01-21: qty 0.36

## 2019-01-21 MED ORDER — TECHNETIUM TC 99M PYROPHOSPHATE
20.8000 | Freq: Once | INTRAVENOUS | Status: AC
  Administered 2019-01-21: 20.8 via INTRAVENOUS

## 2019-01-21 MED ORDER — NOREPINEPHRINE-SODIUM CHLORIDE 4-0.9 MG/250ML-% IV SOLN: 0.0000 ug/min | INTRAVENOUS | Status: DC

## 2019-01-21 MED ORDER — INSULIN ASPART 100 UNIT/ML ~~LOC~~ SOLN
0.0000 [IU] | SUBCUTANEOUS | Status: DC
  Administered 2019-01-21: 7 [IU] via SUBCUTANEOUS
  Administered 2019-01-21: 3 [IU] via SUBCUTANEOUS
  Administered 2019-01-22 (×2): 2 [IU] via SUBCUTANEOUS
  Administered 2019-01-22: 1 [IU] via SUBCUTANEOUS
  Administered 2019-01-22 (×2): 2 [IU] via SUBCUTANEOUS

## 2019-01-21 MED ORDER — INSULIN GLARGINE 100 UNIT/ML ~~LOC~~ SOLN
10.0000 [IU] | Freq: Every day | SUBCUTANEOUS | Status: DC
  Filled 2019-01-21: qty 0.1

## 2019-01-21 MED FILL — Hydralazine HCl Inj 20 MG/ML: INTRAMUSCULAR | Qty: 1 | Status: AC

## 2019-01-21 NOTE — Progress Notes (Addendum)
Advanced Heart Failure Rounding Note  PCP-Cardiologist: Nicki Guadalajara, MD   Subjective:    Cr stable at 2.02 -> 2.03 post cath and with diuresis. Weight shows down 4 lbs with 1 dose 80 mg IV lasix. PICC line pending.  Started on milrinone 0.25 mcg/kg/min.   Feeling fatigued and anxious this am. Started having R sided chest pain about "5 minutes ago" describes it as a pressure, and not similar to pain he's previously had. He remains SOB with exertion and lying flat. Appetite is very poor.   Echo 12/04/2018 LVEF 50-55%, ? Restrictive physiology, Mod/Sev Functional MR, PA peak pressure 79 mm Hg, + Left pleural effusion   [facility] January 29, 2019  Ost LM lesion is 85% stenosed.  Prox LAD to Mid LAD lesion is 100% stenosed.  Ost Cx to Prox Cx lesion is 100% stenosed.  Ost RCA to Dist RCA lesion is 100% stenosed.  LIMA graft was visualized by angiography and is normal in caliber.  Origin to Prox Graft lesion before 2nd Mrg is 100% stenosed.  SVG graft was visualized by angiography and is normal in caliber.  Origin to Prox Graft lesion is 100% stenosed.  LV end diastolic pressure is severely elevated.  Hemodynamic findings consistent with severe pulmonary hypertension. Hemodynamics (mmHg) RA mean 19 RV 80/11 PA 82/31 (53) PCWP 34 AO 151/70 Cardiac Output (Fick) 4.19 Cardiac Index (Fick) 2.16 MV sat 48% PVR 5 WU PAPI 2.68  Objective:   Weight Range: 74.6 kg Body mass index is 23.6 kg/m.   Vital Signs:   Temp:  [98.2 F (36.8 C)-98.9 F (37.2 C)] 98.9 F (37.2 C) (01/28 0400) Pulse Rate:  [76-102] 94 (01/28 0600) Resp:  [17-34] 26 (01/28 0600) BP: (109-191)/(45-120) 126/49 (01/28 0600) SpO2:  [88 %-96 %] 96 % (01/28 0600) Weight:  [74.6 kg] 74.6 kg (01/28 0500)    Weight change: Filed Weights   January 29, 2019 0651 01/21/19 0500  Weight: 76.2 kg 74.6 kg    Intake/Output:   Intake/Output Summary (Last 24 hours) at 01/21/2019 0707 Last data filed at 01/21/2019  0600 Gross per 24 hour  Intake 50.49 ml  Output 1000 ml  Net -949.51 ml      Physical Exam    General: Elderly. NAD HEENT: Normal Neck: Supple. JVP to ear. Carotids 2+ bilat; no bruits. No lymphadenopathy or thyromegaly appreciated. Cor: PMI nondisplaced. Regular rate & rhythm. 3/6 MR Lungs: Bibasilar crackles.  Abdomen: Soft, nontender, nondistended. No hepatosplenomegaly. No bruits or masses. Good bowel sounds. Extremities: No cyanosis, clubbing, or rash. 2-3+ edema.  Neuro: Alert & orientedx3, cranial nerves grossly intact. moves all 4 extremities w/o difficulty. Affect pleasant  Telemetry   Sinus tachy 100s, personally reviewed.   EKG    No new tracings.    Labs    CBC Recent Labs    January 29, 2019 1020  29-Jan-2019 1611 01/21/19 0304  WBC 7.9  --   --  9.9  NEUTROABS  --   --   --  8.0*  HGB 11.3*   < > 11.9* 10.9*  HCT 35.8*   < > 35.0* 34.6*  MCV 90.6  --   --  88.3  PLT 243  --   --  250   < > = values in this interval not displayed.   Basic Metabolic Panel Recent Labs    36/46/80 1020  01/29/2019 1611 01/21/19 0304  NA 137   < > 143 136  K 4.0   < > 3.4* 4.2  CL  109  --   --  101  CO2 17*  --   --  18*  GLUCOSE 349*  --   --  353*  BUN 28*  --   --  27*  CREATININE 2.02*  --   --  2.03*  CALCIUM 8.8*  --   --  8.6*   < > = values in this interval not displayed.   Liver Function Tests No results for input(s): AST, ALT, ALKPHOS, BILITOT, PROT, ALBUMIN in the last 72 hours. No results for input(s): LIPASE, AMYLASE in the last 72 hours. Cardiac Enzymes No results for input(s): CKTOTAL, CKMB, CKMBINDEX, TROPONINI in the last 72 hours.  BNP: BNP (last 3 results) Recent Labs    11/29/18 0858  BNP 700.9*    ProBNP (last 3 results) No results for input(s): PROBNP in the last 8760 hours.   D-Dimer No results for input(s): DDIMER in the last 72 hours. Hemoglobin A1C No results for input(s): HGBA1C in the last 72 hours. Fasting Lipid Panel No  results for input(s): CHOL, HDL, LDLCALC, TRIG, CHOLHDL, LDLDIRECT in the last 72 hours. Thyroid Function Tests No results for input(s): TSH, T4TOTAL, T3FREE, THYROIDAB in the last 72 hours.  Invalid input(s): FREET3  Other results:   Imaging     No results found.   Medications:     Scheduled Medications: . amLODipine  2.5 mg Oral Daily  . aspirin EC  81 mg Oral Daily  . B-complex with vitamin C  1 tablet Oral Daily  . cholecalciferol  2,000 Units Oral Daily  . enoxaparin (LOVENOX) injection  40 mg Subcutaneous Q24H  . furosemide  80 mg Intravenous BID  . gabapentin  100 mg Oral QHS  . insulin aspart  0-15 Units Subcutaneous TID WC  . isosorbide mononitrate  60 mg Oral Daily  . potassium chloride  20 mEq Oral BID  . sodium chloride flush  3 mL Intravenous Q12H  . sodium chloride flush  3 mL Intravenous Q12H     Infusions: . sodium chloride    . sodium chloride    . milrinone 0.25 mcg/kg/min (01/21/19 0600)     PRN Medications:  sodium chloride, sodium chloride, acetaminophen, ondansetron (ZOFRAN) IV, sodium chloride flush, sodium chloride flush   Patient Profile   Henry LessGlenn T Golphin Jr. is a 78 y.o. male with PMH of type 1 DM, HTN, HLD, PAD, and CAD.  Admitted from cath lab with acute on chronic diastolic CHF, pulmonary hypertension, and moderately low cardiac output. CHF team asked to consult for further evaluation and treatment.   Assessment/Plan   1. Acute on chronic diastolic CHF with restrictive physiology - Echo 12/04/2018 LVEF 50-55%, ? Restrictive physiology, Mod/Sev Functional MR, PA peak pressure 79 mm Hg, + Left pleural effusion -> Per Dr. Gala RomneyBensimhon at least Mild to Moderate RV HK.  - Volume status overloaded on exam and by cath.  - Continue lasix 80 mg IV BID.  - Continue milrinone 0.25 mcg/kg/min. PICC line pending for CVP and Coox.  - Will order myeloma panel.  2. 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  - Had normal VQ scan 12/2002, nothing more recent.  - For now will focus on diuresis.   3. CAD s/p CABG - Cath as above. 2/4 grafts patent. No interventional targets.  - No s/s of ischemia.  Having atypical chest pain this am, at rest. Will give tramadol and follow. Will check  CXR.  - Continue ASA.  - Intolerant to atorvastatin and zetia. Could try crestor or pravastatin.   4. CKD III - Baseline Cr runs 2.0 - 2.2 - Likely not candidate for HD with end-stage restrictive CMP  5. 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   6. DM type I - Insulin dependent.  - SSI while in house.   7. HTN - Meds as above.  - Continue amlodipine 2.5 mg daily - Continue imdur 60 mg daily - Continue BB as above.   8. HLD - Per [facility]. No change.  - Total Chol 142, TGs 81, HDL 36, LDL 90.  Medication concerns reviewed with patient and pharmacy team. Barriers identified: None at this time.   Length of Stay: 1  Luane School  01/21/2019, 7:07 AM  Advanced Heart Failure Team Pager [phone] (M-F; 7a - 4p)  Please contact [facility] for night-coverage after hours (4p -7a ) and weekends on [email or web address]  Patient seen and examined with the above-signed Advanced Practice Provider and/or Housestaff. I personally reviewed laboratory data, imaging studies and relevant notes. I independently examined the patient and formulated the important aspects of the plan. I have edited the note to reflect any of my changes or salient points. I have personally discussed the plan with the patient and/or family.  He has diuresed well overnight on IV lasix. Renal function stable post cath. Remains markedly volume overloaded. Continue milrinone and IV lasix. PICC line pending. Agree with checking myeloma panel and will check PYP testing. (Not candidate for cMRI with CKD). Once fully diuresed will need TEE to further evaluate MV for possible Clip though I suspect MR will improve some with  optimization of his HF and volume overload. CAD is stable. Doubt CP is ischemic.   CBGs very high this am 350-400 range. Home NPH has been on hold. Suspect we will need to restart but has gotten Lantus already this am. Have contacted DM coordinator and asked them to see him early. Bicarb 18. Will need to watch closely for development of DKA. Will give another 10u insulin now.   Arvilla Meres, MD  8:37 AM

## 2019-01-21 NOTE — Plan of Care (Signed)
  Problem: Clinical Measurements: Goal: Cardiovascular complication will be avoided Outcome: Progressing   

## 2019-01-21 NOTE — Progress Notes (Signed)
Peripherally Inserted Central Catheter/Midline Placement  The IV Nurse has discussed with the patient and/or persons authorized to consent for the patient, the purpose of this procedure and the potential benefits and risks involved with this procedure.  The benefits include less needle sticks, lab draws from the catheter, and the patient may be discharged home with the catheter. Risks include, but not limited to, infection, bleeding, blood clot (thrombus formation), and puncture of an artery; nerve damage and irregular heartbeat and possibility to perform a PICC exchange if needed/ordered by physician.  Alternatives to this procedure were also discussed.  Bard Power PICC patient education guide, fact sheet on infection prevention and patient information card has been provided to patient /or left at bedside.    PICC/Midline Placement Documentation  PICC Double Lumen 01/21/19 PICC Right Brachial 38 cm 0 cm (Active)  Indication for Insertion or Continuance of Line Vasoactive infusions 01/21/2019 11:00 AM  Exposed Catheter (cm) 0 cm 01/21/2019 11:00 AM  Site Assessment Clean;Dry;Intact 01/21/2019 11:00 AM  Lumen #1 Status Flushed;Blood return noted 01/21/2019 11:00 AM  Lumen #2 Status Flushed;Blood return noted 01/21/2019 11:00 AM  Dressing Type Transparent 01/21/2019 11:00 AM  Dressing Status Clean;Dry;Intact;Antimicrobial disc in place 01/21/2019 11:00 AM  Dressing Change Due 01/28/19 01/21/2019 11:00 AM       Stacie GlazeJoyce, Jashad Depaula Horton 01/21/2019, 11:20 AM

## 2019-01-21 NOTE — Progress Notes (Addendum)
Inpatient Diabetes Program Recommendations  AACE/ADA: New Consensus Statement on Inpatient Glycemic Control (2015)  Target Ranges:  Prepandial:   less than 140 mg/dL      Peak postprandial:   less than 180 mg/dL (1-2 hours)      Critically ill patients:  140 - 180 mg/dL   Lab Results  Component Value Date   GLUCAP 396 (H) 01/21/2019   HGBA1C 10.2 (H) 07/27/2018    Review of Glycemic Control Results for BRIA, HUR (MRN [id number]) as of 01/21/2019 09:43  Ref. Range 2019/01/26 17:06 01/26/19 21:45 01/21/2019 04:00 01/21/2019 06:33 01/21/2019 08:31  Glucose-Capillary Latest Ref Range: 70 - 99 mg/dL 643 (H) 329 (H) 518 (H) 341 (H) 396 (H)   Diabetes history: DM1 Outpatient Diabetes medications: Novolin 70/30 insulin mix 46 units am + 20 units pm Current orders for Inpatient glycemic control: Lantus 10 units qd + Novolog moderate correction tid  Inpatient Diabetes Program Recommendations:   -Lantus 22 units daily  -Novolog 3 units tid if eats 50% (increase as appetite increases) -Decrease Novolog correction scale to sensitive q 4 hrs. Patient sees Dr. Everardo All has endocrinologist. Noted ordered additional Novolog 10 units, please observe patient closely and give po liquids to assure patient's CBG does not drop too quickly. Spoke with RN Irving Burton and reviewed plan and orders received from [facility].  Thank you, Billy Fischer. Shaana Acocella, RN, MSN, CDE  Diabetes Coordinator Inpatient Glycemic Control Team Team Pager [phone] (8am-5pm) 01/21/2019 9:53 AM

## 2019-01-22 ENCOUNTER — Other Ambulatory Visit: Payer: Self-pay

## 2019-01-22 ENCOUNTER — Telehealth: Payer: Self-pay | Admitting: Endocrinology

## 2019-01-22 DIAGNOSIS — R570 Cardiogenic shock: Secondary | ICD-10-CM

## 2019-01-22 DIAGNOSIS — N179 Acute kidney failure, unspecified: Secondary | ICD-10-CM

## 2019-01-22 LAB — GLUCOSE, CAPILLARY
Glucose-Capillary: 133 mg/dL — ABNORMAL HIGH (ref 70–99)
Glucose-Capillary: 169 mg/dL — ABNORMAL HIGH (ref 70–99)
Glucose-Capillary: 170 mg/dL — ABNORMAL HIGH (ref 70–99)
Glucose-Capillary: 187 mg/dL — ABNORMAL HIGH (ref 70–99)
Glucose-Capillary: 187 mg/dL — ABNORMAL HIGH (ref 70–99)
Glucose-Capillary: 215 mg/dL — ABNORMAL HIGH (ref 70–99)

## 2019-01-22 LAB — BASIC METABOLIC PANEL
Anion gap: 13 (ref 5–15)
BUN: 47 mg/dL — ABNORMAL HIGH (ref 8–23)
CHLORIDE: 101 mmol/L (ref 98–111)
CO2: 21 mmol/L — ABNORMAL LOW (ref 22–32)
CREATININE: 3.33 mg/dL — AB (ref 0.61–1.24)
Calcium: 8.6 mg/dL — ABNORMAL LOW (ref 8.9–10.3)
GFR calc Af Amer: 20 mL/min — ABNORMAL LOW (ref >60)
GFR calc non Af Amer: 17 mL/min — ABNORMAL LOW (ref >60)
Glucose, Bld: 183 mg/dL — ABNORMAL HIGH (ref 70–99)
Potassium: 3.9 mmol/L (ref 3.5–5.1)
Sodium: 135 mmol/L (ref 135–145)

## 2019-01-22 LAB — COOXEMETRY PANEL
Carboxyhemoglobin: 1.5 % (ref 0.5–1.5)
Methemoglobin: 1.1 % (ref 0.0–1.5)
O2 Saturation: 56.5 %
Total hemoglobin: 10.7 g/dL — ABNORMAL LOW (ref 12.0–16.0)

## 2019-01-22 MED ORDER — AMIODARONE HCL IN DEXTROSE 360-4.14 MG/200ML-% IV SOLN
60.0000 mg/h | INTRAVENOUS | Status: DC
  Administered 2019-01-22 – 2019-01-23 (×5): 60 mg/h via INTRAVENOUS
  Administered 2019-01-23: 36 mg/h via INTRAVENOUS
  Administered 2019-01-24 – 2019-01-25 (×5): 60 mg/h via INTRAVENOUS
  Filled 2019-01-22 (×14): qty 200

## 2019-01-22 MED ORDER — ENOXAPARIN SODIUM 30 MG/0.3ML ~~LOC~~ SOLN
30.0000 mg | SUBCUTANEOUS | Status: DC
  Administered 2019-01-22: 30 mg via SUBCUTANEOUS

## 2019-01-22 MED ORDER — AMIODARONE HCL IN DEXTROSE 360-4.14 MG/200ML-% IV SOLN: 60.0000 mg/h | INTRAVENOUS | Status: DC

## 2019-01-22 MED ORDER — ENOXAPARIN SODIUM 30 MG/0.3ML ~~LOC~~ SOLN
30.0000 mg | SUBCUTANEOUS | Status: DC
  Filled 2019-01-22: qty 0.3

## 2019-01-22 MED ORDER — AMIODARONE LOAD VIA INFUSION
150.0000 mg | Freq: Once | INTRAVENOUS | Status: AC
  Administered 2019-01-22: 150 mg via INTRAVENOUS
  Filled 2019-01-22: qty 83.34

## 2019-01-22 MED ORDER — AMIODARONE HCL IN DEXTROSE 360-4.14 MG/200ML-% IV SOLN
INTRAVENOUS | Status: AC
  Administered 2019-01-22: 150 mg via INTRAVENOUS
  Filled 2019-01-22: qty 200

## 2019-01-22 MED ORDER — AMIODARONE HCL IN DEXTROSE 360-4.14 MG/200ML-% IV SOLN: 30.0000 mg/h | INTRAVENOUS | Status: DC

## 2019-01-22 NOTE — Progress Notes (Signed)
Pt rhythm atrial fib with rapid ventricle response. Confirmed with 12 lead EKG.  Pt not aware and denies chest pain. Vitals per frequent flow sheet. MD  Notified of rhythm and am labs. No orders received.

## 2019-01-22 NOTE — Telephone Encounter (Signed)
Patients wife called to advise that patient has been admitted to [facility].

## 2019-01-22 NOTE — Plan of Care (Signed)
  Problem: Education: Goal: Knowledge of disease or condition will improve Outcome: Progressing Goal: Understanding of medication regimen will improve Outcome: Progressing   Problem: Activity: Goal: Ability to tolerate increased activity will improve Outcome: Not Progressing   Problem: Cardiac: Goal: Ability to achieve and maintain adequate cardiopulmonary perfusion will improve Outcome: Not Progressing   Patient remains in atrial fibrillation with RVR. Patient is feeling fatigued.

## 2019-01-22 NOTE — Telephone Encounter (Signed)
FYI

## 2019-01-22 NOTE — Progress Notes (Addendum)
Advanced Heart Failure Rounding Note  PCP-Cardiologist: Nicki Guadalajara, MD   Subjective:    Coox 56.5% on milrinone 0.25 mcg/kg/min.   Cr stable at 2.02 -> 2.03 -> 3.33 post cath and with diuresis. Weight shows up 5 lbs.  CXR yesterday am showed vascular congestion with bilateral effusions.   Noted to go into Afib with RVR with rates in 140-150s ~ 0515 this am.  He is feeling better overall this am. Chest pain resolved. He denies palpitations or SOB. His main complaint is fatigue. Noted UOP significantly dropped off throughout the day yesterday.   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    Echo 12/04/2018 LVEF 50-55%, ? Restrictive physiology, Mod/Sev Functional MR, PA peak pressure 79 mm Hg, + Left pleural effusion   [facility] 01/27/2019  Ost LM lesion is 85% stenosed.  Prox LAD to Mid LAD lesion is 100% stenosed.  Ost Cx to Prox Cx lesion is 100% stenosed.  Ost RCA to Dist RCA lesion is 100% stenosed.  LIMA graft was visualized by angiography and is normal in caliber.  Origin to Prox Graft lesion before 2nd Mrg is 100% stenosed.  SVG graft was visualized by angiography and is normal in caliber.  Origin to Prox Graft lesion is 100% stenosed.  LV end diastolic pressure is severely elevated.  Hemodynamic findings consistent with severe pulmonary hypertension. Hemodynamics (mmHg) RA mean 19 RV 80/11 PA 82/31 (53) PCWP 34 AO 151/70 Cardiac Output (Fick) 4.19 Cardiac Index (Fick) 2.16 MV sat 48% PVR 5 WU PAPI 2.68  Objective:   Weight Range: 76.8 kg Body mass index is 24.29 kg/m.   Vital Signs:   Temp:  [97.1 F (36.2 C)-98.9 F (37.2 C)] 98.9 F (37.2 C) (01/29 0751) Pulse Rate:  [42-110] 110 (01/29 0400) Resp:  [17-28] 23 (01/29 0400) BP: (109-142)/(49-101) 132/59 (01/29 0400) SpO2:  [86 %-100 %] 93 % (01/29 0400) Weight:  [76.8 kg] 76.8 kg (01/29 0500) Last BM Date: (pta)  Weight change: Filed Weights   01-27-19 0651 01/21/19 0500 01/22/19 0500  Weight: 76.2 kg 74.6 kg 76.8 kg    Intake/Output:   Intake/Output Summary (Last 24 hours) at 01/22/2019 0757 Last data filed at 01/22/2019 0751 Gross per 24 hour  Intake 720.11 ml  Output 250 ml  Net 470.11 ml     Physical Exam    General: NAD HEENT: Normal Neck: Supple. JVP to ear. Carotids 2+ bilat; no bruits. No thyromegaly or nodule noted. Cor: PMI nondisplaced. Irregularly irregular, tachy. 3/6 MR Lungs: Basilar crackles Abdomen: Soft, non-tender, non-distended, no HSM. No bruits or masses. +BS  Extremities: No cyanosis, clubbing, or rash. 2-3+ edema.  Neuro: Alert & orientedx3, cranial nerves grossly intact. moves all 4 extremities w/o difficulty. Affect pleasant   Telemetry   Afib 140s-150s starting at 0515 this am. Prior to that, Sinus tach 100-110s, personally reviewed.   EKG    01/22/2019 am, Afib RVR 130 bpm, personally reviewed.   Labs    CBC Recent Labs    01/27/2019 1020  27-Jan-2019 1611 01/21/19 0304  WBC 7.9  --   --  9.9  NEUTROABS  --   --   --  8.0*  HGB 11.3*   < > 11.9* 10.9*  HCT 35.8*   < > 35.0* 34.6*  MCV 90.6  --   --  88.3  PLT 243  --   --  250   < > = values in this interval  not displayed.   Basic Metabolic Panel Recent Labs    14/38/88 0304 01/22/19 0406  NA 136 135  K 4.2 3.9  CL 101 101  CO2 18* 21*  GLUCOSE 353* 183*  BUN 27* 47*  CREATININE 2.03* 3.33*  CALCIUM 8.6* 8.6*   Liver Function Tests No results for input(s): AST, ALT, ALKPHOS, BILITOT, PROT, ALBUMIN in the last 72 hours. No results for input(s): LIPASE, AMYLASE in the last 72 hours. Cardiac Enzymes No results for input(s): CKTOTAL, CKMB, CKMBINDEX, TROPONINI in the last 72 hours.  BNP: BNP (last 3 results) Recent Labs    11/29/18 0858  BNP 700.9*    ProBNP (last 3 results) No results for input(s): PROBNP in the last 8760 hours.   D-Dimer No results for input(s): DDIMER in the last 72 hours. Hemoglobin  A1C No results for input(s): HGBA1C in the last 72 hours. Fasting Lipid Panel No results for input(s): CHOL, HDL, LDLCALC, TRIG, CHOLHDL, LDLDIRECT in the last 72 hours. Thyroid Function Tests No results for input(s): TSH, T4TOTAL, T3FREE, THYROIDAB in the last 72 hours.  Invalid input(s): FREET3  Other results:   Imaging    Nm Tumor Localization W Spect  Result Date: 01/21/2019 CLINICAL DATA:  HEART FAILURE. CONCERN FOR CARDIAC AMYLOIDOSIS. EXAM: NUCLEAR MEDICINE TUMOR LOCALIZATION. PYP CARDIAC AMYLOIDOSIS SCAN WITH SPECT TECHNIQUE: Following intravenous administration of radiopharmaceutical, anterior planar images of the chest were obtained. Regions of interest were placed on the heart and contralateral chest wall for quantitative assessment. Additional SPECT imaging of the chest was obtained. RADIOPHARMACEUTICALS:  20.8 mCi TECHNETIUM 99 PYROPHOSPHATE FINDINGS: Planar Visual assessment: Anterior planar imaging demonstrates radiotracer uptake within the heart similar to than uptake within the adjacent ribs (Grade 1 / 2). Quantitative assessment : Quantitative assessment of the cardiac uptake compared to the contralateral chest wall is equal to 1.56 (H/CL = 1.56). SPECT assessment: SPECT imaging of the chest demonstrates moderate radiotracer accumulation within the LEFT ventricle. IMPRESSION: Visual and quantitative assessment (grade 1 to 2, H/CL equal 1.56) are suggestive of transthyretin amyloidosis. Consider repeat exam (1 to 3 months) in this borderline positive study. Electronically Signed   By: Genevive Bi M.D.   On: 01/21/2019 14:36   Dg Chest Port 1 View  Result Date: 01/21/2019 CLINICAL DATA:  Congestive failure EXAM: PORTABLE CHEST 1 VIEW COMPARISON:  01/02/2019 FINDINGS: Bilateral pleural effusions are again seen but increased particularly on the left when compared with the prior exam. Postsurgical changes are again noted. Bi basilar atelectatic changes are seen. Mild vascular  congestion is noted. IMPRESSION: Vascular congestion with bilateral effusions which appear enlarged when compared with the previous CT. Bibasilar atelectatic changes are noted. Electronically Signed   By: Alcide Clever M.D.   On: 01/21/2019 09:29   [place] Ekg Site Rite  Result Date: 01/21/2019 If Site Rite image not attached, placement could not be confirmed due to current cardiac rhythm.    Medications:     Scheduled Medications: . amLODipine  2.5 mg Oral Daily  . aspirin EC  81 mg Oral Daily  . B-complex with vitamin C  1 tablet Oral Daily  . Chlorhexidine Gluconate Cloth  6 each Topical Daily  . cholecalciferol  2,000 Units Oral Daily  . enoxaparin (LOVENOX) injection  30 mg Subcutaneous Q24H  . gabapentin  100 mg Oral QHS  . insulin aspart  0-9 Units Subcutaneous Q4H  . insulin aspart  3 Units Subcutaneous TID WC  . insulin glargine  22 Units Subcutaneous QHS  .  isosorbide mononitrate  60 mg Oral Daily  . sodium chloride flush  10-40 mL Intracatheter Q12H  . sodium chloride flush  3 mL Intravenous Q12H  . sodium chloride flush  3 mL Intravenous Q12H    Infusions: . sodium chloride    . sodium chloride    . milrinone 0.25 mcg/kg/min (01/22/19 0400)    PRN Medications: sodium chloride, sodium chloride, acetaminophen, ondansetron (ZOFRAN) IV, sodium chloride flush, sodium chloride flush, sodium chloride flush, traMADol   Patient Profile   Henry LessGlenn T Amenta Jr. is a 78 y.o. male with PMH of type 1 DM, HTN, HLD, PAD, and CAD.  Admitted from cath lab with acute on chronic diastolic CHF, pulmonary hypertension, and moderately low cardiac output. CHF team asked to consult for further evaluation and treatment.   Assessment/Plan   1. Acute on chronic diastolic CHF with restrictive physiology - Echo 12/04/2018 LVEF 50-55%, ? Restrictive physiology, Mod/Sev Functional MR, PA peak pressure 79 mm Hg, + Left pleural effusion -> Per Dr. Gala RomneyBensimhon at least Mild to Moderate RV HK.  - Volume  status elevated, but now with ARF - Hold lasix and K. CVP 13-14 - Coox 56.5% on milrinone 0.25 mcg/kg/min. - Myeloma panel pending.  - PYP scan grade "1 to 2", H/CL 1.56.  "Suggestive" of TTR-amyloidosis.    2. Afib RVR - New this am.  - Will start on amiodarone with bolus.  - This patients CHA2DS2-VASc is at least 6.  - He has proliferative diabetic retinopathy, so will hold off on blood thinners for now.   3. 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  - Had normal VQ scan 12/2002, nothing more recent.  - Focusing on diuresis.   4. CAD s/p CABG - Cath as above. 2/4 grafts patent. No interventional targets.  - No s/s of ischemia.  Had atypical CP 01/21/2019, resolved.  - Continue ASA.  - Intolerant to atorvastatin and zetia. Could try crestor or pravastatin.   5. ARF on CKD III - Baseline Cr runs 2.0 - 2.2 - Cr up to 3.33 this am.  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 No change.  - Will need TEE once stable.   7. DM type I - Insulin dependent. Sugars improved. Appreciate diabetes coordinator input.  - SSI while in house.   8. HTN - Meds as above.  - Continue amlodipine 2.5 mg daily - Continue imdur 60 mg daily - Continue BB as above.   9. HLD - Per [facility]. No change.  - Total Chol 142, TGs 81, HDL 36, LDL 90.  Patient is critically ill and in multiple system organ failure.  Prognosis guarded. If he continues to worsen, low threshold to involve palliative care team to discuss GOC.   Medication concerns reviewed with patient and pharmacy team. Barriers identified: None at this time.   Length of Stay: 2  Luane SchoolMichael Andrew Tillery, PA-C  01/22/2019, 7:57 AM  Advanced Heart Failure Team Pager [phone] (M-F; 7a - 4p)  Please contact [facility] for night-coverage after hours (4p -7a ) and weekends on [email or web address]  Agree with above.   He is  much worse today. Now in AF with RVR. Very weak. Co-ox ok on milrinone but renal function going up. CBGs better controlled. PVP scan equivocal for amyloid  Weak Elderly NAD JVP to ear Cor IRR tachy  Lungs basilar crackles Ab soft NT Ext 1+ edema  He is worse today. Will start amio for AF. Unable to anticoagulate due to extensive diabetic retinopathy with previous hemorrhage. Will continue milrinone. Hold diuretics. PYP scan reviewed personally - no clear amyloid. He may be nearing point for Palliative Care involvement   CRITICAL CARE Performed by: Arvilla MeresBensimhon, Anton Cheramie  Total critical care time: 35 minutes  Critical care time was exclusive of separately billable procedures and treating other patients.  Critical care was necessary to treat or prevent imminent or life-threatening deterioration.  Critical care was time spent personally by me (independent of midlevel providers or residents) on the following activities: development of treatment plan with patient and/or surrogate as well as nursing, discussions with consultants, evaluation of patient's response to treatment, examination of patient, obtaining history from patient or surrogate, ordering and performing treatments and interventions, ordering and review of laboratory studies, ordering and review of radiographic studies, pulse oximetry and re-evaluation of patient's condition.  Arvilla Meresaniel Elmira Olkowski, MD  9:59 AM

## 2019-01-23 ENCOUNTER — Ambulatory Visit: Payer: Medicare Other | Admitting: Podiatry

## 2019-01-23 ENCOUNTER — Encounter (HOSPITAL_COMMUNITY): Payer: Self-pay

## 2019-01-23 LAB — BASIC METABOLIC PANEL
Anion gap: 16 — ABNORMAL HIGH (ref 5–15)
BUN: 58 mg/dL — ABNORMAL HIGH (ref 8–23)
CO2: 18 mmol/L — ABNORMAL LOW (ref 22–32)
Calcium: 8.4 mg/dL — ABNORMAL LOW (ref 8.9–10.3)
Chloride: 96 mmol/L — ABNORMAL LOW (ref 98–111)
Creatinine, Ser: 4.65 mg/dL — ABNORMAL HIGH (ref 0.61–1.24)
GFR calc Af Amer: 13 mL/min — ABNORMAL LOW (ref >60)
GFR calc non Af Amer: 11 mL/min — ABNORMAL LOW (ref >60)
Glucose, Bld: 360 mg/dL — ABNORMAL HIGH (ref 70–99)
Potassium: 4.9 mmol/L (ref 3.5–5.1)
Sodium: 130 mmol/L — ABNORMAL LOW (ref 135–145)

## 2019-01-23 LAB — MULTIPLE MYELOMA PANEL, SERUM
ALPHA 1: 0.3 g/dL (ref 0.0–0.4)
Albumin SerPl Elph-Mcnc: 3.5 g/dL (ref 2.9–4.4)
Albumin/Glob SerPl: 1.2 (ref 0.7–1.7)
Alpha2 Glob SerPl Elph-Mcnc: 0.9 g/dL (ref 0.4–1.0)
B-Globulin SerPl Elph-Mcnc: 0.9 g/dL (ref 0.7–1.3)
GAMMA GLOB SERPL ELPH-MCNC: 1 g/dL (ref 0.4–1.8)
Globulin, Total: 3 g/dL (ref 2.2–3.9)
IgA: 242 mg/dL (ref 61–437)
IgG (Immunoglobin G), Serum: 1209 mg/dL (ref 700–1600)
IgM (Immunoglobulin M), Srm: 33 mg/dL (ref 15–143)
Total Protein ELP: 6.5 g/dL (ref 6.0–8.5)

## 2019-01-23 LAB — CBC
HCT: 31.2 % — ABNORMAL LOW (ref 39.0–52.0)
Hemoglobin: 9.9 g/dL — ABNORMAL LOW (ref 13.0–17.0)
MCH: 27.8 pg (ref 26.0–34.0)
MCHC: 31.7 g/dL (ref 30.0–36.0)
MCV: 87.6 fL (ref 80.0–100.0)
Platelets: 226 10*3/uL (ref 150–400)
RBC: 3.56 MIL/uL — ABNORMAL LOW (ref 4.22–5.81)
RDW: 13.8 % (ref 11.5–15.5)
WBC: 11.1 10*3/uL — ABNORMAL HIGH (ref 4.0–10.5)
nRBC: 0 % (ref 0.0–0.2)

## 2019-01-23 LAB — COOXEMETRY PANEL
Carboxyhemoglobin: 1.3 % (ref 0.5–1.5)
Carboxyhemoglobin: 1.1 % (ref 0.5–1.5)
Methemoglobin: 1.6 % — ABNORMAL HIGH (ref 0.0–1.5)
Methemoglobin: 1.5 % (ref 0.0–1.5)
O2 SAT: 83.9 %
O2 SAT: 64.9 %
Total hemoglobin: 10.4 g/dL — ABNORMAL LOW (ref 12.0–16.0)
Total hemoglobin: 12.7 g/dL (ref 12.0–16.0)

## 2019-01-23 LAB — GLUCOSE, CAPILLARY
GLUCOSE-CAPILLARY: 375 mg/dL — AB (ref 70–99)
Glucose-Capillary: 340 mg/dL — ABNORMAL HIGH (ref 70–99)
Glucose-Capillary: 372 mg/dL — ABNORMAL HIGH (ref 70–99)
Glucose-Capillary: 434 mg/dL — ABNORMAL HIGH (ref 70–99)

## 2019-01-23 MED ORDER — AMIODARONE LOAD VIA INFUSION
150.0000 mg | Freq: Once | INTRAVENOUS | Status: AC
  Administered 2019-01-23: 150 mg via INTRAVENOUS
  Filled 2019-01-23: qty 83.34

## 2019-01-23 MED ORDER — INSULIN ASPART 100 UNIT/ML ~~LOC~~ SOLN
0.0000 [IU] | Freq: Three times a day (TID) | SUBCUTANEOUS | Status: DC
  Administered 2019-01-23: 10 [IU] via SUBCUTANEOUS
  Administered 2019-01-24: 5 [IU] via SUBCUTANEOUS
  Administered 2019-01-24: 15 [IU] via SUBCUTANEOUS
  Administered 2019-01-24: 11 [IU] via SUBCUTANEOUS
  Administered 2019-01-25: 8 [IU] via SUBCUTANEOUS
  Administered 2019-01-25: 5 [IU] via SUBCUTANEOUS

## 2019-01-23 MED ORDER — HEPARIN SODIUM (PORCINE) 5000 UNIT/ML IJ SOLN
5000.0000 [IU] | Freq: Three times a day (TID) | INTRAMUSCULAR | Status: DC
  Administered 2019-01-23 (×2): 5000 [IU] via SUBCUTANEOUS
  Filled 2019-01-23: qty 1

## 2019-01-23 MED ORDER — MORPHINE SULFATE (PF) 2 MG/ML IV SOLN
2.0000 mg | INTRAVENOUS | Status: DC | PRN
  Administered 2019-01-23 – 2019-01-24 (×4): 2 mg via INTRAVENOUS
  Administered 2019-01-24: 1 mg via INTRAVENOUS
  Administered 2019-01-24 – 2019-01-25 (×3): 2 mg via INTRAVENOUS
  Filled 2019-01-23 (×9): qty 1

## 2019-01-23 MED ORDER — HEPARIN SODIUM (PORCINE) 5000 UNIT/ML IJ SOLN: 5000.0000 [IU] | Freq: Three times a day (TID) | INTRAMUSCULAR | Status: DC

## 2019-01-23 MED ORDER — INSULIN GLARGINE 100 UNIT/ML ~~LOC~~ SOLN
26.0000 [IU] | Freq: Every day | SUBCUTANEOUS | Status: DC
  Administered 2019-01-23 – 2019-01-24 (×2): 26 [IU] via SUBCUTANEOUS
  Filled 2019-01-23 (×3): qty 0.26

## 2019-01-23 NOTE — Progress Notes (Addendum)
Inpatient Diabetes Program Recommendations  AACE/ADA: New Consensus Statement on Inpatient Glycemic Control (2015)  Target Ranges:  Prepandial:   less than 140 mg/dL      Peak postprandial:   less than 180 mg/dL (1-2 hours)      Critically ill patients:  140 - 180 mg/dL   Lab Results  Component Value Date   GLUCAP 372 (H) 01/23/2019   HGBA1C 10.2 (H) 07/27/2018    Review of Glycemic Control  Inpatient Diabetes Program Recommendations:   Noted Novolog correction scale discontinued.  Thank you, Billy Fischer. Adella Manolis, RN, MSN, CDE  Diabetes Coordinator Inpatient Glycemic Control Team Team Pager [phone] (8am-5pm) 01/23/2019 2:24 PM

## 2019-01-23 NOTE — Progress Notes (Signed)
Patient c/o tightness and closing of the throat; thought to be related to fluid build up. Per Bensimhon's note PRN morphine and versed to be ordered. Cardiology fellow paged. Will continue to closely monitor and assess patient.

## 2019-01-23 NOTE — Progress Notes (Signed)
Sliding Scale Insulin coverage restarted per suggestion of Diabetic Coordinator.  CBG 434 - PA aware. Planned to administer 15 U per scale however pt. Requested only 10U be given since he has experienced episodes of quick glucose drop in past -  10 U given per patients request.

## 2019-01-23 NOTE — Progress Notes (Addendum)
Advanced Heart Failure Rounding Note  PCP-Cardiologist: Nicki Guadalajarahomas Kelly, MD   Subjective:    Coox 83.9% (? Accuracy) on milrinone 0.25 mcg/kg/min.   Cr 2.02 -> 2.03 -> 3.33 -> 4.65.  Weight up 2 more lbs.   Started on amiodarone 01/22/2019 with AFib RVR. Rates remain 110-130s. Personally reviewed.   Remains fatigued. Denies palpitations or CP. No lightheadedness or dizziness. He is SOB moving from bed to chair.   Myeloma panel pending.   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    Echo 12/04/2018 LVEF 50-55%, ? Restrictive physiology, Mod/Sev Functional MR, PA peak pressure 79 mm Hg, + Left pleural effusion   [facility] 02/27/19  Ost LM lesion is 85% stenosed.  Prox LAD to Mid LAD lesion is 100% stenosed.  Ost Cx to Prox Cx lesion is 100% stenosed.  Ost RCA to Dist RCA lesion is 100% stenosed.  LIMA graft was visualized by angiography and is normal in caliber.  Origin to Prox Graft lesion before 2nd Mrg is 100% stenosed.  SVG graft was visualized by angiography and is normal in caliber.  Origin to Prox Graft lesion is 100% stenosed.  LV end diastolic pressure is severely elevated.  Hemodynamic findings consistent with severe pulmonary hypertension. Hemodynamics (mmHg) RA mean 19 RV 80/11 PA 82/31 (53) PCWP 34 AO 151/70 Cardiac Output (Fick) 4.19 Cardiac Index (Fick) 2.16 MV sat 48% PVR 5 WU PAPI 2.68  Objective:   Weight Range: 77.9 kg Body mass index is 24.64 kg/m.   Vital Signs:   Temp:  [98.6 F (37 C)-99.2 F (37.3 C)] 98.7 F (37.1 C) (01/30 0700) Pulse Rate:  [36-165] 49 (01/30 0645) Resp:  [17-37] 23 (01/30 0645) BP: (86-141)/(54-97) 131/63 (01/30 0645) SpO2:  [88 %-100 %] 95 % (01/30 0645) Weight:  [77.9 kg] 77.9 kg (01/30 0547) Last BM Date: (PTA.)  Weight change: Filed Weights   01/21/19 0500 01/22/19 0500 01/23/19 0547  Weight: 74.6 kg 76.8 kg 77.9 kg    Intake/Output:   Intake/Output  Summary (Last 24 hours) at 01/23/2019 0710 Last data filed at 01/23/2019 0600 Gross per 24 hour  Intake 848.59 ml  Output 750 ml  Net 98.59 ml     Physical Exam    CVP 16  General: Chronically ill appearing.  HEENT: Normal Neck: Supple. JVP to jaw. Carotids 2+ bilat; no bruits. No thyromegaly or nodule noted. Cor: PMI nondisplaced. Irregularly irregular, tachy. 3/6 MR.  Lungs: Basilar crackles Abdomen: Soft, non-tender, non-distended, no HSM. No bruits or masses. +BS  Extremities: No cyanosis, clubbing, or rash. 2-3+ edema.  Neuro: Alert & orientedx3, cranial nerves grossly intact. moves all 4 extremities w/o difficulty. Affect pleasant   Telemetry   Remains in Afib 110-130s, personally reviewed.   EKG   No new tracings.    Labs    CBC Recent Labs    01/21/19 0304 01/23/19 0443  WBC 9.9 11.1*  NEUTROABS 8.0*  --   HGB 10.9* 9.9*  HCT 34.6* 31.2*  MCV 88.3 87.6  PLT 250 226   Basic Metabolic Panel Recent Labs    16/09/9600/29/20 0406 01/23/19 0443  NA 135 130*  K 3.9 4.9  CL 101 96*  CO2 21* 18*  GLUCOSE 183* 360*  BUN 47* 58*  CREATININE 3.33* 4.65*  CALCIUM 8.6* 8.4*   Liver Function Tests No results for input(s): AST, ALT, ALKPHOS, BILITOT, PROT, ALBUMIN in the last 72 hours. No results for input(s): LIPASE, AMYLASE  in the last 72 hours. Cardiac Enzymes No results for input(s): CKTOTAL, CKMB, CKMBINDEX, TROPONINI in the last 72 hours.  BNP: BNP (last 3 results) Recent Labs    11/29/18 0858  BNP 700.9*    ProBNP (last 3 results) No results for input(s): PROBNP in the last 8760 hours.   D-Dimer No results for input(s): DDIMER in the last 72 hours. Hemoglobin A1C No results for input(s): HGBA1C in the last 72 hours. Fasting Lipid Panel No results for input(s): CHOL, HDL, LDLCALC, TRIG, CHOLHDL, LDLDIRECT in the last 72 hours. Thyroid Function Tests No results for input(s): TSH, T4TOTAL, T3FREE, THYROIDAB in the last 72 hours.  Invalid  input(s): FREET3  Other results:   Imaging    No results found.   Medications:     Scheduled Medications: . amLODipine  2.5 mg Oral Daily  . aspirin EC  81 mg Oral Daily  . B-complex with vitamin C  1 tablet Oral Daily  . Chlorhexidine Gluconate Cloth  6 each Topical Daily  . cholecalciferol  2,000 Units Oral Daily  . enoxaparin (LOVENOX) injection  30 mg Subcutaneous Q24H  . gabapentin  100 mg Oral QHS  . insulin aspart  3 Units Subcutaneous TID WC  . insulin glargine  22 Units Subcutaneous QHS  . isosorbide mononitrate  60 mg Oral Daily    Infusions: . amiodarone 60 mg/hr (01/23/19 0600)  . milrinone 0.25 mcg/kg/min (01/23/19 0600)    PRN Medications: acetaminophen, ondansetron (ZOFRAN) IV, sodium chloride flush, traMADol   Patient Profile   Henry [name]. is a 78 y.o. male with PMH of type 1 DM, HTN, HLD, PAD, and CAD.  Admitted from cath lab with acute on chronic diastolic CHF, pulmonary hypertension, and moderately low cardiac output. CHF team asked to consult for further evaluation and treatment.   Assessment/Plan   1. Acute on chronic diastolic CHF with restrictive physiology - Echo 12/04/2018 LVEF 50-55%, ? Restrictive physiology, Mod/Sev Functional MR, PA peak pressure 79 mm Hg, + Left pleural effusion -> Per Dr. Gala Romney at least Mild to Moderate RV HK.  - Volume status elevated, but now with ARF - Holding lasix and K with ARF/CIN. CVP 15-16  - Coox 83.9% but ? Accuracy on milrinone 0.25 mcg/kg/min. Hold for now with afib worsening his clinical picture.  - Myeloma panel pending.  - PYP scan grade "1 to 2", H/CL 1.56.  "Suggestive" of TTR-amyloidosis.    2. Afib RVR - Unchanged on amiodarone gtt.  - Will plan urgent DCCV today.  - This patients CHA2DS2-VASc is at least 6.  - He has proliferative diabetic retinopathy, so will hold off on blood thinners for now.   3. 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  - Had normal VQ scan 12/2002, nothing more recent.  - Holding on diuresis as above.   4. CAD s/p CABG - Cath as above. 2/4 grafts patent. No interventional targets.  - No s/s of ischemia. Had atypical CP 01/21/2019, resolved.  - Continue ASA.  - Intolerant to atorvastatin and zetia. Could try crestor or pravastatin.   5. ARF on CKD III - Baseline Cr runs 2.0 - 2.2 - Cr up to 4.65 this am. Likely contrast induced, but complicated by low output and AFib RVR.  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 No change.   -  Will need TEE once stable.   7. DM type I - Insulin dependent. Sugars improved Appreciate diabetes coordinator input.  - SSI while in house.   8. HTN - Meds as above.   - Continue amlodipine 2.5 mg daily - Continue imdur 60 mg daily - Continue BB as above.   9. HLD - Per [facility]. No change.  - Total Chol 142, TGs 81, HDL 36, LDL 90.  10. Goals of Care - In discussion this am, he declares that he would not want CPR or intubation, but would want things to take their natural course. Change made to chart.  - Will consult palliative care.  He understands that with his ARF, he may need HD, but with his end stage cardiomyopathy, he would likely not tolerate, and at that point, we would have no way to escalate his care.   Patient is critically ill and in multiple system organ failure.  Prognosis guarded. GOC discussion as above.   Medication concerns reviewed with patient and pharmacy team. Barriers identified: None at this time.   Length of Stay: 3  Luane School  01/23/2019, 7:10 AM  Advanced Heart Failure Team Pager [phone] (M-F; 7a - 4p)  Please contact [facility] for night-coverage after hours (4p -7a ) and weekends on [email or web address]  Agree with above.  He continues to deteriorate. Remains in AF with RVR despite IV amio. More SOB. Renal function now worse with  creatinine 4.65 despite adequate co-ox on milrinone. 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 He is very weak and SOB.   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 Failing treatment with amiodarone. Not candidate for anticoagulation due to severe DM retinopathy.   I had long talk with him and his wife today about his situation. If we want to proceed with aggressive care he will need DC-CV today followed by initiation of CRRT. He states that his QOL has not been what he has wanted it to be for quite some time. And he is not sure he would want to proceed and is more interested in a comfort approach. We will discuss further with his daughter later today and make a decision. He is DNR for now.   CRITICAL CARE Performed by: Arvilla Meres  Total critical care time: 45 minutes  Critical care time was exclusive of separately billable procedures and treating other patients.  Critical care was necessary to treat or prevent imminent or life-threatening deterioration.  Critical care was time spent personally by me (independent of midlevel providers or residents) on the following activities: development of treatment plan with patient and/or surrogate as well as nursing, discussions with consultants, evaluation of patient's response to treatment, examination of patient, obtaining history from patient or surrogate, ordering and performing treatments and interventions, ordering and review of laboratory studies, ordering and review of radiographic studies, pulse oximetry and re-evaluation of patient's condition.  Arvilla Meres, MD  10:35 AM

## 2019-01-23 NOTE — Progress Notes (Signed)
PMT consult received and chart reviewed. PMT provider will see patient in AM. Thank you.   NO CHARGE  Vennie Homans, FNP-C Palliative Medicine Team  Phone: [phone] Fax: [phone]

## 2019-01-23 NOTE — Progress Notes (Signed)
   Family meeting with patient and his family.   He feels more comfortable this afternoon though he notes more swelling in his arms.  Urine output sluggish off lasix.   He has opted for comfort care.   As he is comfortable now will not start comfort drips yet. Suspect he will have more dyspnea as renal function worsens and fluid overload increases.    Start morphine and versed as needed. Will resume IV lasix in am.   Additional CCT 30 mins.   Arvilla Meres, MD  3:48 PM

## 2019-01-24 DIAGNOSIS — R0600 Dyspnea, unspecified: Secondary | ICD-10-CM

## 2019-01-24 DIAGNOSIS — Z515 Encounter for palliative care: Secondary | ICD-10-CM

## 2019-01-24 DIAGNOSIS — E1142 Type 2 diabetes mellitus with diabetic polyneuropathy: Secondary | ICD-10-CM

## 2019-01-24 DIAGNOSIS — N179 Acute kidney failure, unspecified: Secondary | ICD-10-CM

## 2019-01-24 LAB — BASIC METABOLIC PANEL
Anion gap: 17 — ABNORMAL HIGH (ref 5–15)
BUN: 76 mg/dL — ABNORMAL HIGH (ref 8–23)
CO2: 19 mmol/L — ABNORMAL LOW (ref 22–32)
Calcium: 8.6 mg/dL — ABNORMAL LOW (ref 8.9–10.3)
Chloride: 92 mmol/L — ABNORMAL LOW (ref 98–111)
Creatinine, Ser: 6.28 mg/dL — ABNORMAL HIGH (ref 0.61–1.24)
GFR calc Af Amer: 9 mL/min — ABNORMAL LOW (ref >60)
GFR calc non Af Amer: 8 mL/min — ABNORMAL LOW (ref >60)
Glucose, Bld: 374 mg/dL — ABNORMAL HIGH (ref 70–99)
Potassium: 5.3 mmol/L — ABNORMAL HIGH (ref 3.5–5.1)
Sodium: 128 mmol/L — ABNORMAL LOW (ref 135–145)

## 2019-01-24 LAB — COOXEMETRY PANEL
Carboxyhemoglobin: 0.9 % (ref 0.5–1.5)
Methemoglobin: 1.6 % — ABNORMAL HIGH (ref 0.0–1.5)
O2 Saturation: 53 %
Total hemoglobin: 11.4 g/dL — ABNORMAL LOW (ref 12.0–16.0)

## 2019-01-24 LAB — HEPATIC FUNCTION PANEL
ALT: 18 U/L (ref 0–44)
AST: 20 U/L (ref 15–41)
Albumin: 3.2 g/dL — ABNORMAL LOW (ref 3.5–5.0)
Alkaline Phosphatase: 88 U/L (ref 38–126)
Bilirubin, Direct: 0.2 mg/dL (ref 0.0–0.2)
Indirect Bilirubin: 0.3 mg/dL (ref 0.3–0.9)
TOTAL PROTEIN: 6.5 g/dL (ref 6.5–8.1)
Total Bilirubin: 0.5 mg/dL (ref 0.3–1.2)

## 2019-01-24 LAB — GLUCOSE, CAPILLARY
Glucose-Capillary: 384 mg/dL — ABNORMAL HIGH (ref 70–99)
Glucose-Capillary: 339 mg/dL — ABNORMAL HIGH (ref 70–99)
Glucose-Capillary: 240 mg/dL — ABNORMAL HIGH (ref 70–99)

## 2019-01-24 LAB — TSH: TSH: 5.455 u[IU]/mL — ABNORMAL HIGH (ref 0.350–4.500)

## 2019-01-24 MED ORDER — CHLORHEXIDINE GLUCONATE CLOTH 2 % EX PADS: 6.0000 | MEDICATED_PAD | Freq: Every day | CUTANEOUS | Status: DC

## 2019-01-24 MED ORDER — GLYCOPYRROLATE 0.2 MG/ML IJ SOLN: 0.2000 mg | INTRAMUSCULAR | Status: DC | PRN

## 2019-01-24 MED ORDER — SODIUM CHLORIDE 0.9 % IV SOLN
INTRAVENOUS | Status: DC | PRN
  Administered 2019-01-24 – 2019-01-25 (×2): 250 mL via INTRAVENOUS

## 2019-01-24 MED ORDER — FUROSEMIDE 10 MG/ML IJ SOLN
160.0000 mg | Freq: Once | INTRAVENOUS | Status: AC
  Administered 2019-01-24: 160 mg via INTRAVENOUS
  Filled 2019-01-24: qty 16

## 2019-01-24 MED ORDER — SODIUM ZIRCONIUM CYCLOSILICATE 10 G PO PACK
10.0000 g | PACK | Freq: Once | ORAL | Status: AC
  Administered 2019-01-24: 10 g via ORAL
  Filled 2019-01-24: qty 1

## 2019-01-24 MED ORDER — MIDAZOLAM HCL 2 MG/2ML IJ SOLN
1.0000 mg | INTRAMUSCULAR | Status: DC | PRN
  Administered 2019-01-24: 1 mg via INTRAVENOUS
  Filled 2019-01-24: qty 2

## 2019-01-24 NOTE — Progress Notes (Addendum)
Advanced Heart Failure Rounding Note  PCP-Cardiologist: Nicki Guadalajara, MD   Subjective:    Coox 53%. Milrinone stopped.   Cr 2.02 -> 2.03 -> 3.33 -> 4.65 -> 6.28. K 5.3.  Started on amiodarone 01/22/2019 with AFib RVR. Remains in AFib 100-110s.   He is comfortable this am with prn morphine. He has family coming in today. He denies SOB at rest.   Made comfort care after long discussion with family 01/23/2019.  Myeloma panel unremarkable.   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    Echo 12/04/2018 LVEF 50-55%, ? Restrictive physiology, Mod/Sev Functional MR, PA peak pressure 79 mm Hg, + Left pleural effusion   [facility] 01/13/2019  Ost LM lesion is 85% stenosed.  Prox LAD to Mid LAD lesion is 100% stenosed.  Ost Cx to Prox Cx lesion is 100% stenosed.  Ost RCA to Dist RCA lesion is 100% stenosed.  LIMA graft was visualized by angiography and is normal in caliber.  Origin to Prox Graft lesion before 2nd Mrg is 100% stenosed.  SVG graft was visualized by angiography and is normal in caliber.  Origin to Prox Graft lesion is 100% stenosed.  LV end diastolic pressure is severely elevated.  Hemodynamic findings consistent with severe pulmonary hypertension. Hemodynamics (mmHg) RA mean 19 RV 80/11 PA 82/31 (53) PCWP 34 AO 151/70 Cardiac Output (Fick) 4.19 Cardiac Index (Fick) 2.16 MV sat 48% PVR 5 WU PAPI 2.68  Objective:   Weight Range: 77.9 kg Body mass index is 24.64 kg/m.   Vital Signs:   Temp:  [97.9 F (36.6 C)-99.2 F (37.3 C)] 98.7 F (37.1 C) (01/31 0650) Pulse Rate:  [85-146] 110 (01/31 0700) Resp:  [17-36] 26 (01/31 0700) BP: (119-146)/(61-80) 138/71 (01/31 0700) SpO2:  [89 %-97 %] 91 % (01/31 0700) Last BM Date: (PTA.)  Weight change: Filed Weights   01/21/19 0500 01/22/19 0500 01/23/19 0547  Weight: 74.6 kg 76.8 kg 77.9 kg    Intake/Output:   Intake/Output Summary (Last 24 hours) at 01/24/2019  0737 Last data filed at 01/24/2019 0700 Gross per 24 hour  Intake 881.14 ml  Output -  Net 881.14 ml     Physical Exam    General: Chronically ill appearing.  HEENT: Normal Neck: Supple. JVP to jaw. Carotids 2+ bilat; no bruits. No thyromegaly or nodule noted. Cor: PMI nondisplaced. Irregularly irregular, tachy. 3/6 MR.  Lungs: Basilar crackles.  Abdomen: Soft, non-tender, non-distended, no HSM. No bruits or masses. +BS  Extremities: No cyanosis, clubbing, or rash. 2-3+ edema.  Neuro: Alert & orientedx3, cranial nerves grossly intact. moves all 4 extremities w/o difficulty. Affect pleasant   Telemetry   Remains in Afib 100/110s, personally reviewed.   EKG   No new tracings.    Labs    CBC Recent Labs    01/23/19 0443  WBC 11.1*  HGB 9.9*  HCT 31.2*  MCV 87.6  PLT 226   Basic Metabolic Panel Recent Labs    95/63/87 0443 01/24/19 0550  NA 130* 128*  K 4.9 5.3*  CL 96* 92*  CO2 18* 19*  GLUCOSE 360* 374*  BUN 58* 76*  CREATININE 4.65* 6.28*  CALCIUM 8.4* 8.6*   Liver Function Tests Recent Labs    01/24/19 0550  AST 20  ALT 18  ALKPHOS 88  BILITOT 0.5  PROT 6.5  ALBUMIN 3.2*   No results for input(s): LIPASE, AMYLASE in the last 72 hours. Cardiac Enzymes No results  for input(s): CKTOTAL, CKMB, CKMBINDEX, TROPONINI in the last 72 hours.  BNP: BNP (last 3 results) Recent Labs    11/29/18 0858  BNP 700.9*    ProBNP (last 3 results) No results for input(s): PROBNP in the last 8760 hours.   D-Dimer No results for input(s): DDIMER in the last 72 hours. Hemoglobin A1C No results for input(s): HGBA1C in the last 72 hours. Fasting Lipid Panel No results for input(s): CHOL, HDL, LDLCALC, TRIG, CHOLHDL, LDLDIRECT in the last 72 hours. Thyroid Function Tests Recent Labs    01/24/19 0550  TSH 5.455*    Other results:   Imaging    No results found.   Medications:     Scheduled Medications: . aspirin EC  81 mg Oral Daily  .  B-complex with vitamin C  1 tablet Oral Daily  . Chlorhexidine Gluconate Cloth  6 each Topical Daily  . Chlorhexidine Gluconate Cloth  6 each Topical Daily  . cholecalciferol  2,000 Units Oral Daily  . gabapentin  100 mg Oral QHS  . heparin injection (subcutaneous)  5,000 Units Subcutaneous Q8H  . insulin aspart  0-15 Units Subcutaneous TID WC  . insulin aspart  3 Units Subcutaneous TID WC  . insulin glargine  26 Units Subcutaneous QHS  . isosorbide mononitrate  60 mg Oral Daily    Infusions: . amiodarone 60 mg/hr (01/24/19 0722)    PRN Medications: acetaminophen, morphine injection, ondansetron (ZOFRAN) IV, sodium chloride flush, traMADol   Patient Profile   Henry LessGlenn T Artist Jr. is a 78 y.o. male with PMH of type 1 DM, HTN, HLD, PAD, and CAD.  Admitted from cath lab with acute on chronic diastolic CHF, pulmonary hypertension, and moderately low cardiac output. CHF team asked to consult for further evaluation and treatment.   Assessment/Plan   1. Acute on chronic diastolic CHF with restrictive physiology - Echo 12/04/2018 LVEF 50-55%, ? Restrictive physiology, Mod/Sev Functional MR, PA peak pressure 79 mm Hg, + Left pleural effusion -> Per Dr. Gala RomneyBensimhon at least Mild to Moderate RV HK.  - Volume status elevated, but now with ARF - Holding lasix and K with ARF/CIN. CVP 15-16  - Coox 53.0% this am. Off milrinone.  - Myeloma panel unremarkable.  - PYP scan grade "1 to 2", H/CL 1.56.  "Suggestive" of TTR-amyloidosis.    2. Afib RVR - Unchanged on amiodarone gtt. - He is now comfort care. Can consider DCCV for palliation. Unlikely to change course.  - This patients CHA2DS2-VASc is at least 6.  - He has proliferative diabetic retinopathy, so will hold off on blood thinners for now.   3. 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  - Had normal VQ scan 12/2002, nothing more recent.  - Holding on diuresis as above.   4. CAD s/p  CABG - Cath as above. 2/4 grafts patent. No interventional targets.  - No s/s of ischemia. Had atypical CP 01/21/2019, resolved.  - Continue ASA.  - Intolerant to atorvastatin and zetia. Could try crestor or pravastatin.   5. ARF on CKD III - Baseline Cr runs 2.0 - 2.2 - Cr up to 6.3 this am. Not candidate for HD.   6. 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  - No change. He is now comfort care.   7. DM type I - Insulin dependent. Sugars improved Appreciate diabetes coordinator input.  - SSI while in house.  - With  worsening AKI, be careful not to overshoot his sugars.   8. HTN - Meds as above.  - Continue amlodipine 2.5 mg daily - Continue imdur 60 mg daily - Continue BB as above.   9. HLD - Per [facility]. No change.  - Total Chol 142, TGs 81, HDL 36, LDL 90.  10. Hyperkalemia - In setting of ARF. K 5.3 this am.  - Will give dose of Lokelma. Not HD candidate.   11. Goals of Care - He is now comfort care. Will try large dose of lasix this am and follow closely.   Patient is critically ill and in multiple system organ failure.  He has been made comfort care.   Medication concerns reviewed with patient and pharmacy team. Barriers identified: None at this time.   Length of Stay: 4  Luane School  01/24/2019, 7:37 AM  Advanced Heart Failure Team Pager [phone] (M-F; 7a - 4p)  Please contact [facility] for night-coverage after hours (4p -7a ) and weekends on [email or web address]  Agree with above. He has continued to decline overnight. More SOB. Renal function worse and poor urine output. Remains in AF with RVR on amio. Milrinone off. Co-ox back down. Creatinine up to 6.3. K 5.3  Sitting up in bed NAD JVP to ear Cor IRR tachy Lungs + crackles Ab soft NT Ext mild edema  He continues to deteriorate. We are transitioning to comfort care but will continue to watch renal function. More family will arrive today and I would like him to be clear to visit with  them. Can start morphine and versed drips as needed. Palliative care seeing now.   CRITICAL CARE Performed by: Arvilla Meres  Total critical care time: 35 minutes  Critical care time was exclusive of separately billable procedures and treating other patients.  Critical care was necessary to treat or prevent imminent or life-threatening deterioration.  Critical care was time spent personally by me (independent of midlevel providers or residents) on the following activities: development of treatment plan with patient and/or surrogate as well as nursing, discussions with consultants, evaluation of patient's response to treatment, examination of patient, obtaining history from patient or surrogate, ordering and performing treatments and interventions, ordering and review of laboratory studies, ordering and review of radiographic studies, pulse oximetry and re-evaluation of patient's condition. Arvilla Meres, MD  7:48 AM

## 2019-01-24 NOTE — Progress Notes (Signed)
   01/24/19 2100  Clinical Encounter Type  Visited With Patient not available;Family  Visit Type Initial;Patient actively dying  Referral From Physician  Consult/Referral To Chaplain  Spiritual Encounters  Spiritual Needs Emotional;Grief support  Stress Factors  Patient Stress Factors Exhausted  Family Stress Factors Major life changes   Responded to Epic consult to see whether EOL support needed tonight or if preferrable to visit during day tomorrow.  Pt appeared to be sleeping and SIL was bedside.  He said the rest of the family had left for the night and that he stayed to spend a bit of quiet time w/ pt.  I left to allow that and let him know that I will refer on to the Sat day chaplain, but to ask to have chaplain paged if return desired before then.  Also asked another RN to convey same msg to pt's current care RN.    Margretta Sidle resident, [phone]

## 2019-01-24 NOTE — Consult Note (Addendum)
Consultation Note Date: 01/24/2019   Patient Name: Henry [name].  DOB: [birth year]  MRN: [id number]  Age / Sex: 78 y.o., male  PCP: Henry Nian, DO Referring Physician: Jolaine Artist, MD  Reason for Consultation: Establishing goals of care  HPI/Patient Profile: 78 y.o. male  with past medical history of acute on chronic diastolic CHF, pulmonary hypertension, CAD, MI, CABG, HTN, HLD, type 1 DM, diabetic retinopathy, COPD, PAD admitted on 12/26/2018 with shortness of breath. Patient critically ill with acute on chronic diastolic CHF, end-stage restrictive cardiomyopathy, afib RVR, and AKI. Failing treatment with amiodarone. Not a candidate for anticoagulation due to severe DM retinopathy. [place] discussion with HF team and patient/family 1/30. Patient does not wish to pursue cardioversion or initiation of CRRT but requesting comfort approach. Palliative medicine consultation for goals of care/end of life care.   Clinical Assessment and Goals of Care:  I have reviewed medical records, discussed with care team, and initially met with patient at bedside to discuss diagnosis prognosis, GOC, EOL wishes, disposition and options. This afternoon, follow-up with patient, wife Henry [name]), and two daughter Henry [name] and Henry [name].   Patient recently had an episode of choking/dyspnea relieved by prn morphine.   Introduced Palliative Medicine as specialized medical care for people living with serious illness. It focuses on providing relief from the symptoms and stress of a serious illness.  We discussed a brief life review of the patient. Henry [name] and Henry [name] have been married for 71 years. They have two daughters and two grandchildren. He is a retired Optometrist. Diagnosed with type 1 DM when he was 78 years old. He feels lucky that he has lived 20+ years after initial cardiac event (MI in 1999).   Henry [name] shares that his  quality of life has declined in the last year, but especially in the last few weeks. He can no longer sleep in his bed, but only in a recliner due to orthopnea. He jokes of being [name] he wouldn't have to deal with his health conditions for much longer.   Discussed course of hospitalization including diagnoses and interventions. Discussed their conversation with Dr. Haroldine [name] and CHF team. Patient and family confirm understanding of multi-organ failure with heart and lung conditions and Henry [name]'s decision to focus on comfort and symptom management. He does not wish to pursue dialysis and shares that dialysis would not help his quality of life.   The natural disease trajectory and expectations at EOL were discussed. Discussed poor prognosis with increasing creatinine, poor urine output, and beginning symptoms of uremia. Discussed goal of comfort, peace, and dignity at EOL. Discussed symptom management medications to ensure relief from pain and suffering. Explained initiation of continuous infusions if he is requiring frequent prns for comfort. Patient states relief from prn morphine doses and is trying not to take frequently so he is not "foggy." He wishes to spend time awake and alert with family. We discussed that symptoms may change quickly and if so, will start infusions if needed. Patient/family understand.   Further discussed  patient's hx of type 1 DM and ongoing CBG/insulin. He starts having generalized pain and discomfort if CBG's are over 400. He prefers to continue to check sugars ACHS. Patient/family understand they can decline CBG check if they desire. Discussed comfort feeds.   Patient shares that he feels "calm" and "at peace" with his decision. His family is appropriately tearful but also very respectful of his decision for comfort measure and allow nature to take course.   Spiritual family. Their minister has visited today. They are agreeable for visit from chaplain as well.   We discussed  transfer to 6N for EOL care if HF team approves. Explained comfort measures and discontinuation of interventions not aimed at comfort. Also further explained checking vitals about twice a day, but more focus on his symptoms by assessment. Explained non-verbal signs of discomfort. We briefly discussed transfer to hospice facility if he remained stable for transfer. Patient and family very appreciative of care received on 2H and hope to stay on 2H as long as HF team will allow. They understand he may be moved out of ICU in the next day or two.   Questions and concerns were addressed. PMT contact information given. Therapeutic listening and emotional/spiritual support provided.    SUMMARY OF RECOMMENDATIONS    Patient/family confirm focus on comfort measures. Patient does not wish to continue aggressive medical interventions and declining initiation of dialysis, understanding poor prognosis.   Discussed utilization of medications to ensure comfort and relief from pain and suffering. Patient comfortable on current prn medication. Low threshold for initiating continuous infusion. Patient/family understand if symptoms worsen, care team will further evaluate and initiate drips if needed.   Patient wishes to continue CBG checks. If his blood sugar >400, he becomes symptomatic with generalized pain and discomfort.   Educated on EOL expectations and transfer to [facility] for comfort care per HF team. Discussed with [place], [place]. Family understands if he is transferred to 6N, interventions not aimed at comfort will be discontinued including cardiac monitor and amio infusion.   Briefly introduced hospice role and philosophy. PMT will have further discussions with family if appropriate. At this time, patient and family wish to stay on [place] as long as possible. Will defer transfer to attending.   Spiritual consult for EOL care.   Comfort feeds per patient/family request.  Unrestricted visitor access.   Code  Status/Advance Care Planning:  DNR  Symptom Management:   Morphine '2mg'$  IV q2h prn pain/dyspnea--Consider changing to dilaudid or fentanyl with worsening creatinine. For now, patient states relief from prn morphine doses.   Versed '1mg'$  IV q2h prn anxiety/agitation  Robinul 0.'2mg'$  IV q4h prn secretions  Palliative Prophylaxis:   Aspiration, Delirium Protocol, Frequent Pain Assessment, Oral Care and Turn Reposition  Additional Recommendations (Limitations, Scope, Preferences):  Comfort focused care. DNR/DNI. NO dialysis.  Psycho-social/Spiritual:   Desire for further Chaplaincy support:yes  Additional Recommendations: Caregiving  Support/Resources, Compassionate Wean Education and Education on Hospice  Prognosis:   Poor prognosis likely days with end-stage restrictive cardiomyopathy, advanced heart failure, AKI with worsening renal function, PAF with RVR. Patient requesting comfort measures only.   Discharge Planning: To Be Determined      Primary Diagnoses: Present on Admission: . Dyslipidemia . Essential hypertension . Coronary atherosclerosis . 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 Diabetic peripheral neuropathy ([place]) . Peripheral arterial disease ([place]) . CKD (chronic kidney disease), stage III ([place]) . Mitral insufficiency . Dyspnea . Acute on chronic combined systolic (congestive) and  diastolic (congestive) heart failure ([place]) . Acute on chronic diastolic (congestive) heart failure ([place])   I have reviewed the medical record, interviewed the patient and family, and examined the patient. The following aspects are pertinent.  Past Medical History:  Diagnosis Date  . ANEMIA-NOS 07/10/2007  . CORONARY ARTERY DISEASE 07/10/2007  . DIABETES MELLITUS, TYPE I 07/10/2007  . HYPERLIPIDEMIA 07/10/2007  . HYPERTENSION 07/10/2007  . Peripheral arterial disease ([place])   . 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 RENAL  INSUFFICIENCY 07/10/2007   Social History   Socioeconomic History  . Marital status: Married    Spouse name: Not on file  . Number of children: Not on file  . Years of education: BS  . Highest education level: Not on file  Occupational History  . Occupation: retired    Comment: 14 yrs ago  Social Needs  . Financial resource strain: Not on file  . Food insecurity:    Worry: Not on file    Inability: Not on file  . Transportation needs:    Medical: Not on file    Non-medical: Not on file  Tobacco Use  . Smoking status: Former Research scientist (life sciences)  . Smokeless tobacco: Never Used  Substance and Sexual Activity  . Alcohol use: No  . Drug use: No  . Sexual activity: Not on file  Lifestyle  . Physical activity:    Days per week: Not on file    Minutes per session: Not on file  . Stress: Not on file  Relationships  . Social connections:    Talks on phone: Not on file    Gets together: Not on file    Attends religious service: Not on file    Active member of club or organization: Not on file    Attends meetings of clubs or organizations: Not on file    Relationship status: Not on file  Other Topics Concern  . Not on file  Social History Narrative   Says his diet and exercise are very good   Married lives with wife   Family History  Problem Relation Age of Onset  . Heart disease Mother   . Cancer Neg Hx    Scheduled Meds: . aspirin EC  81 mg Oral Daily  . gabapentin  100 mg Oral QHS  . insulin aspart  0-15 Units Subcutaneous TID WC  . insulin aspart  3 Units Subcutaneous TID WC  . insulin glargine  26 Units Subcutaneous QHS  . isosorbide mononitrate  60 mg Oral Daily   Continuous Infusions: . sodium chloride Stopped (01/24/19 1135)  . amiodarone 60 mg/hr (01/24/19 1500)   PRN Meds:.sodium chloride, acetaminophen, midazolam, morphine injection, ondansetron (ZOFRAN) IV, sodium chloride flush, traMADol Medications Prior to Admission:  Prior to Admission medications     Medication Sig Start Date End Date Taking? Authorizing Provider  acetaminophen (TYLENOL) 500 MG tablet Take 500 mg by mouth every 6 (six) hours as needed for moderate pain or headache.    Yes [provider]  amLODipine (NORVASC) 2.5 MG tablet Take 2.5 mg by mouth daily.  09/18/18  Yes [provider]  aspirin EC 81 MG tablet Take 81 mg by mouth daily.   Yes [provider]  B Complex-C-Folic Acid (FOLBEE PLUS) TABS Take 1 tablet by mouth daily.     Yes [provider]  Cholecalciferol (VITAMIN D) 2000 UNITS CAPS Take 2,000 Units by mouth daily.    Yes [provider]  gabapentin (NEURONTIN) 100 MG capsule Take  100 mg by mouth at bedtime.    Yes [provider]  insulin NPH-regular Human (NOVOLIN 70/30) (70-30) 100 UNIT/ML injection 41 units with breakfast and 25 units with supper Patient taking differently: Inject 25-41 Units into the skin See admin instructions. Inject 41 units SQ with breakfast and inject 25 units SQ with supper 09/19/18  Yes Renato Shin, MD  isosorbide mononitrate (IMDUR) 60 MG 24 hr tablet TAKE 1 TABLET BY MOUTH DAILY Patient taking differently: Take 60 mg by mouth daily.  05/28/18  Yes Troy Sine, MD  metoprolol succinate (TOPROL-XL) 25 MG 24 hr tablet Take 25 mg by mouth 2 (two) times daily.  09/18/18  Yes [provider]  omeprazole (PRILOSEC OTC) 20 MG tablet Take 20 mg by mouth daily.   Yes [provider]  torsemide (DEMADEX) 20 MG tablet Take 1 tablet (20 mg total) by mouth daily. 01/02/19  Yes Almyra Deforest, PA   Allergies  Allergen Reactions  . Hydralazine Swelling and Other (See Comments)    Feet swelled so bad they were cracking Severe foot swelling  . Penicillins Other (See Comments)    Per allergy test. (pt states he was never given penicillin)  . Amlodipine Besylate Swelling  . Atorvastatin Other (See Comments)    Muscle aches/pain.   . Codeine Other (See Comments)    anxiety  .  Contrast Media [Iodinated Diagnostic Agents] Other (See Comments)    Kidney issues when given  . Lipitor [Atorvastatin Calcium] Other (See Comments)    myalgia  . Other Other (See Comments)    X ray dye, kidney problem  . Valsartan Other (See Comments) and Swelling    Caused kidney damage  . Zetia [Ezetimibe] Other (See Comments)    Severe dizziness   Review of Systems  Constitutional: Positive for activity change and appetite change.  Respiratory: Positive for shortness of breath.   Neurological: Positive for weakness.   Physical Exam Vitals signs and nursing note reviewed.  Constitutional:      General: He is awake.     Appearance: He is ill-appearing.  HENT:     Head: Normocephalic and atraumatic.  Cardiovascular:     Rate and Rhythm: Rhythm irregularly irregular.     Comments: afib RVR Pulmonary:     Effort: No tachypnea, accessory muscle usage or respiratory distress.     Comments: Dyspnea at rest Abdominal:     Tenderness: There is no abdominal tenderness.  Skin:    General: Skin is warm and dry.     Coloration: Skin is pale.  Neurological:     Mental Status: He is alert and oriented to person, place, and time.  Psychiatric:        Mood and Affect: Mood normal.        Speech: Speech normal.        Behavior: Behavior normal.        Cognition and Memory: Cognition normal.    Vital Signs: BP 124/79   Pulse (!) 114   Temp 97.9 F (36.6 C) (Oral)   Resp (!) 24   Ht '5\' 10"'$  (1.778 m)   Wt 77.9 kg   SpO2 93%   BMI 24.64 kg/m  Pain Scale: 0-10   Pain Score: 0-No pain   SpO2: SpO2: 93 % O2 Device:SpO2: 93 % O2 Flow Rate: .O2 Flow Rate (L/min): 5 L/min  IO: Intake/output summary:   Intake/Output Summary (Last 24 hours) at 01/24/2019 1622 Last data filed at 01/24/2019 1500 Gross  per 24 hour  Intake 949.15 ml  Output -  Net 949.15 ml    LBM: Last BM Date: (PTA.) Baseline Weight: Weight: 76.2 kg Most recent weight: Weight: 77.9 kg     Palliative  Assessment/Data: PPS 30%   Flowsheet Rows     Most Recent Value  Intake Tab  Referral Department  Cardiology  Unit at Time of Referral  ICU  Palliative Care Primary Diagnosis  Cardiac  Palliative Care Type  New Palliative care  Date first seen by Palliative Care  01/24/19  Clinical Assessment  Palliative Performance Scale Score  30%  Psychosocial & Spiritual Assessment  Palliative Care Outcomes  Patient/Family meeting held?  Yes  Who was at the meeting?  patient, wife, daughters  Palliative Care Outcomes  Improved pain interventions, Improved non-pain symptom therapy, Clarified goals of care, Counseled regarding hospice, Provided end of life care assistance, Provided psychosocial or spiritual support, ACP counseling assistance      Time In/Out: 0730-0800, 1300-1400 Time Total: 65mn Greater than 50%  of this time was spent counseling and coordinating care related to the above assessment and plan.  Signed by:  MIhor Dow FNP-C Palliative Medicine Team  Phone: [phone]: [phone]  Please contact Palliative Medicine Team phone at [phone] questions and concerns.  For individual provider: See AShea Evans

## 2019-01-25 LAB — GLUCOSE, CAPILLARY
GLUCOSE-CAPILLARY: 205 mg/dL — AB (ref 70–99)
Glucose-Capillary: 266 mg/dL — ABNORMAL HIGH (ref 70–99)
Glucose-Capillary: 233 mg/dL — ABNORMAL HIGH (ref 70–99)

## 2019-01-25 LAB — BASIC METABOLIC PANEL
ANION GAP: 22 — AB (ref 5–15)
BUN: 87 mg/dL — ABNORMAL HIGH (ref 8–23)
CO2: 18 mmol/L — ABNORMAL LOW (ref 22–32)
Calcium: 8.4 mg/dL — ABNORMAL LOW (ref 8.9–10.3)
Chloride: 86 mmol/L — ABNORMAL LOW (ref 98–111)
Creatinine, Ser: 7.44 mg/dL — ABNORMAL HIGH (ref 0.61–1.24)
GFR, EST AFRICAN AMERICAN: 7 mL/min — AB (ref >60)
GFR, EST NON AFRICAN AMERICAN: 6 mL/min — AB (ref >60)
Glucose, Bld: 360 mg/dL — ABNORMAL HIGH (ref 70–99)
Potassium: 5.9 mmol/L — ABNORMAL HIGH (ref 3.5–5.1)
Sodium: 126 mmol/L — ABNORMAL LOW (ref 135–145)

## 2019-01-25 MED ORDER — MIDAZOLAM 50MG/50ML (1MG/ML) PREMIX INFUSION
5.0000 mg/h | INTRAVENOUS | Status: DC
  Administered 2019-01-25: 5 mg/h via INTRAVENOUS
  Filled 2019-01-25: qty 50

## 2019-01-25 MED ORDER — MORPHINE SULFATE (PF) 2 MG/ML IV SOLN: 2.0000 mg | INTRAVENOUS | Status: AC

## 2019-01-25 MED ORDER — MIDAZOLAM BOLUS VIA INFUSION
5.0000 mg | Freq: Once | INTRAVENOUS | Status: AC
  Administered 2019-01-25: 5 mg via INTRAVENOUS
  Filled 2019-01-25: qty 5

## 2019-01-25 MED ORDER — MORPHINE SULFATE (PF) 4 MG/ML IV SOLN
5.0000 mg | Freq: Once | INTRAVENOUS | Status: AC
  Administered 2019-01-25: 5 mg via INTRAVENOUS

## 2019-01-25 MED ORDER — MORPHINE 100MG IN NS 100ML (1MG/ML) PREMIX INFUSION
10.0000 mg/h | INTRAVENOUS | Status: DC
  Administered 2019-01-25: 10 mg/h via INTRAVENOUS
  Filled 2019-01-25 (×2): qty 100

## 2019-01-25 DEATH — deceased

## 2019-01-26 IMAGING — MR MR CERVICAL SPINE W/O CM
5 series · 29 of 48 positions shown · non-contrast
Comparison: None.

CLINICAL DATA: 76 y/o M; off balance for 9 months. Three fall since
[DATE].

EXAM:
MRI CERVICAL SPINE WITHOUT CONTRAST
TECHNIQUE: Multiplanar, multisequence MR imaging of the cervical spine was
performed. No intravenous contrast was administered.

[Series 2: T1 · sagittal · 3.0mm · 0.41mm/px · 6 of 13 slices shown]
[im 1/13]
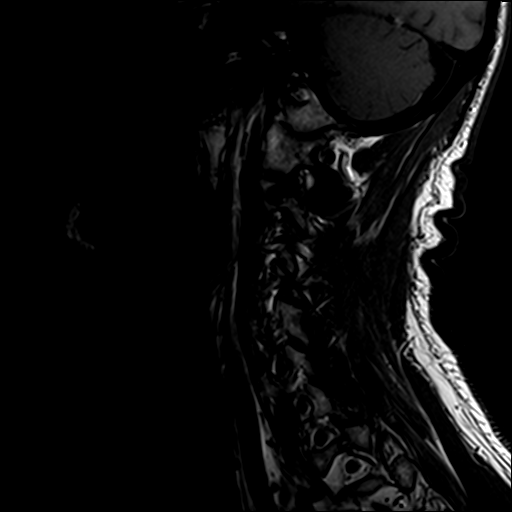
[im 3/13]
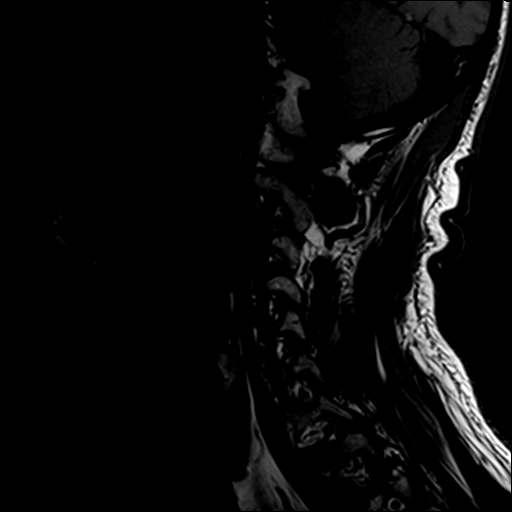
[im 5/13]
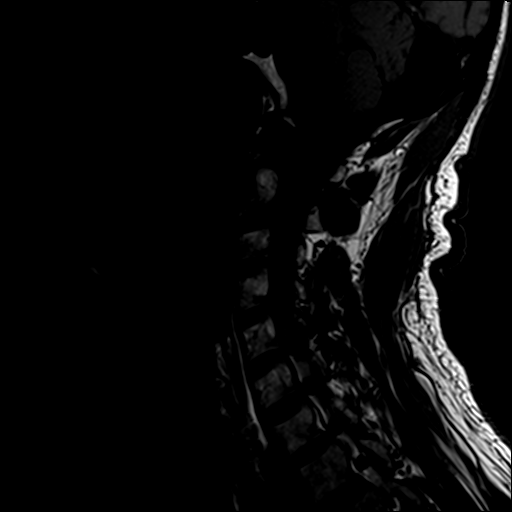
[im 8/13]
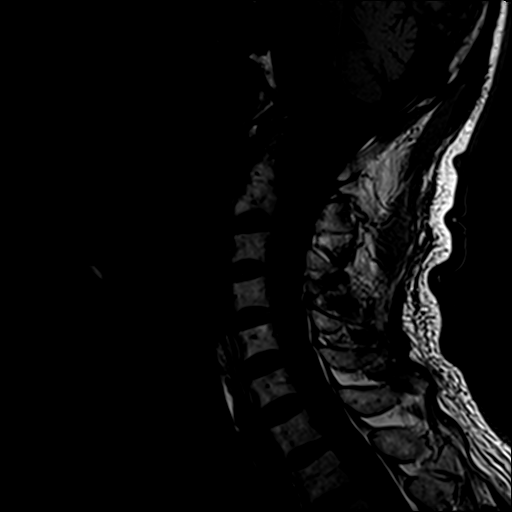
[im 10/13]
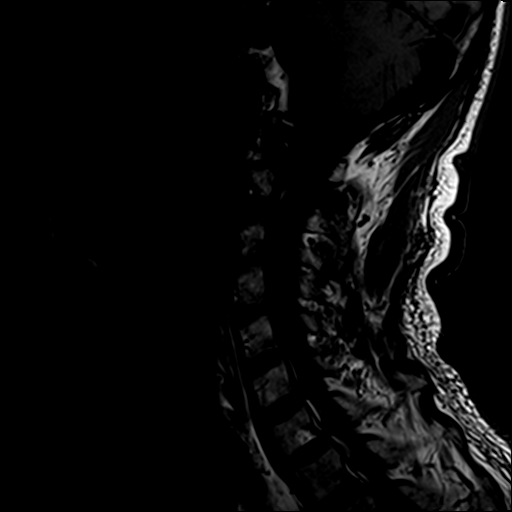
[im 13/13]
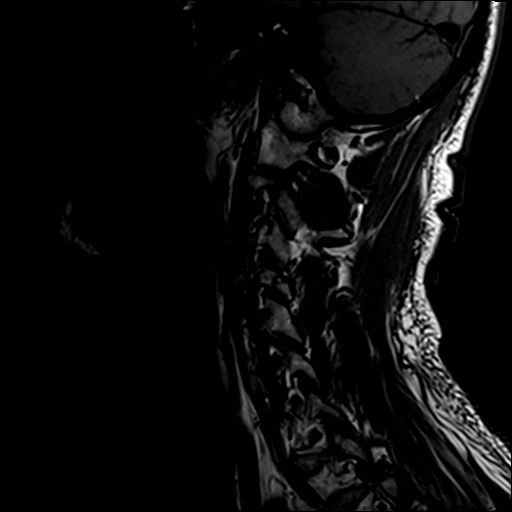

[Series 3: T2 · sagittal · 3.0mm · 0.41mm/px · 6 of 13 slices shown (1 of 2)]
[im 1/13]
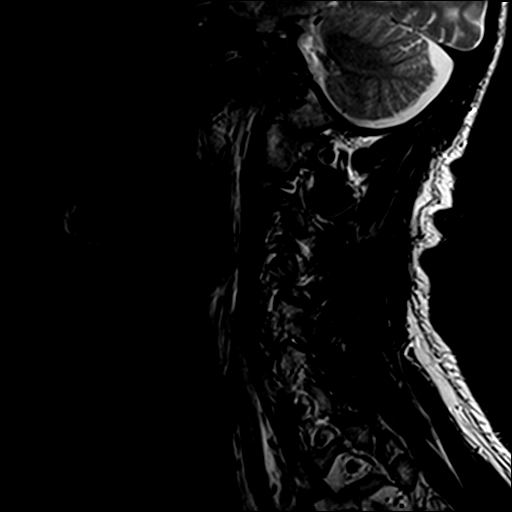
[im 3/13]
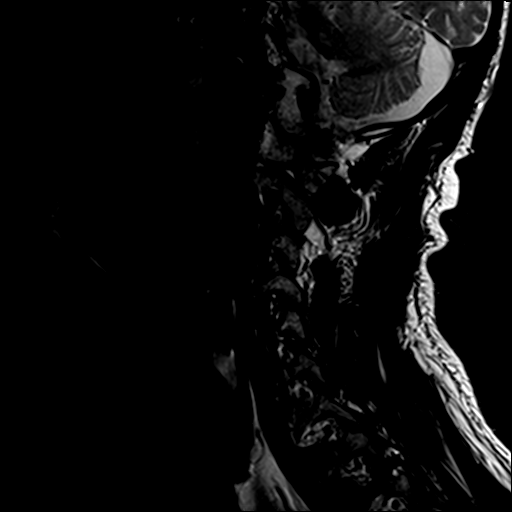
[im 5/13]
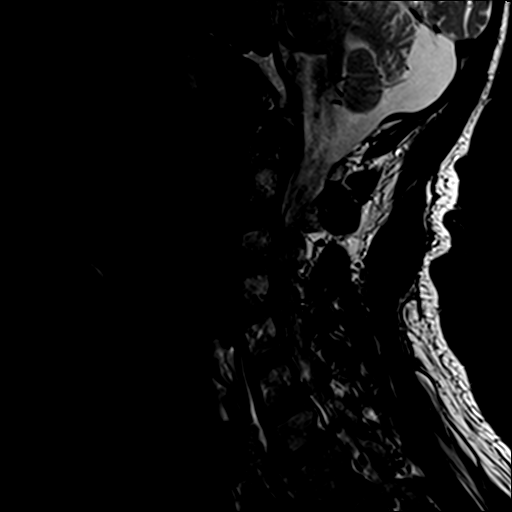
[im 8/13]
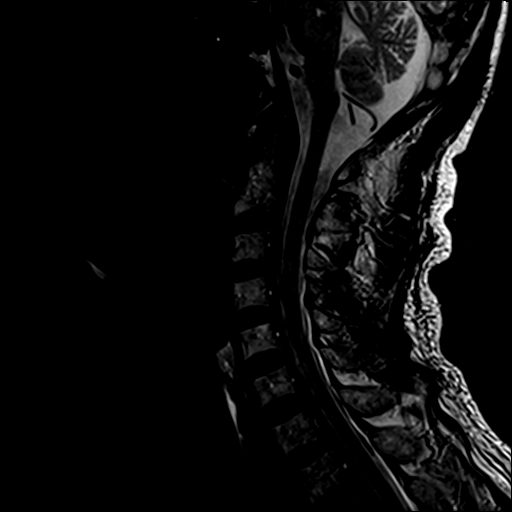
[im 10/13]
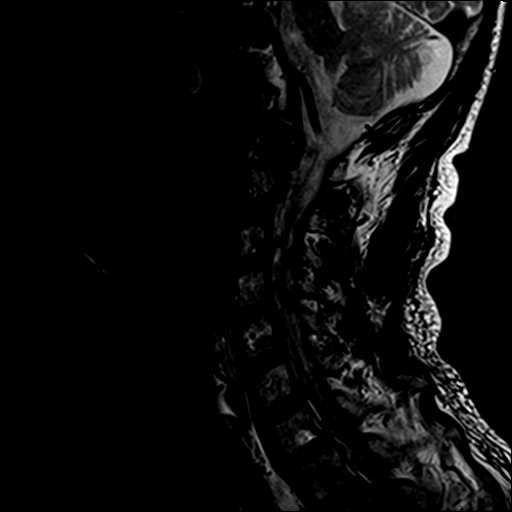
[im 13/13]
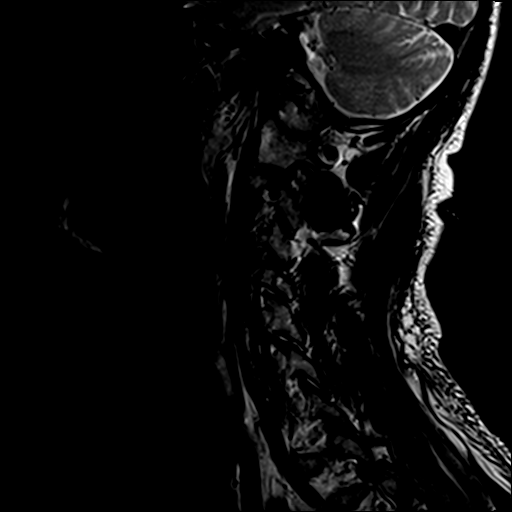

[Series 4: STIR · sagittal · 3.0mm · 0.82mm/px · 6 of 13 slices shown]
[im 1/13]
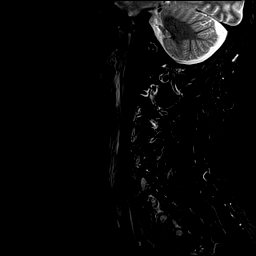
[im 3/13]
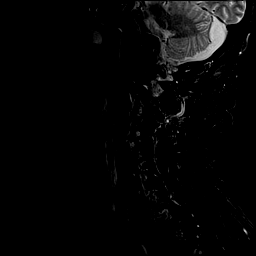
[im 5/13]
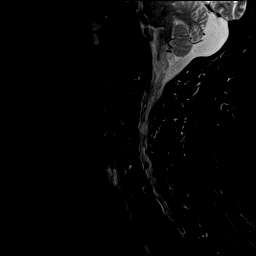
[im 8/13]
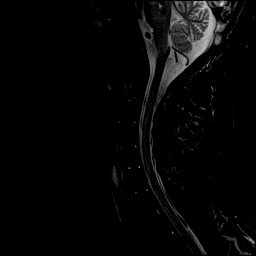
[im 10/13]
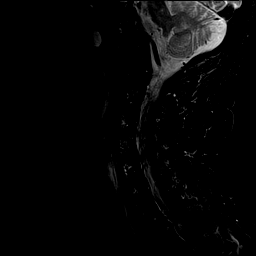
[im 13/13]
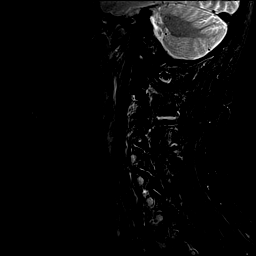

[Series 5: GRE · axial · 3.0mm · 0.35mm/px · z∈[-48,-33]mm · 2 of 31 slices shown]
[im 1/31]
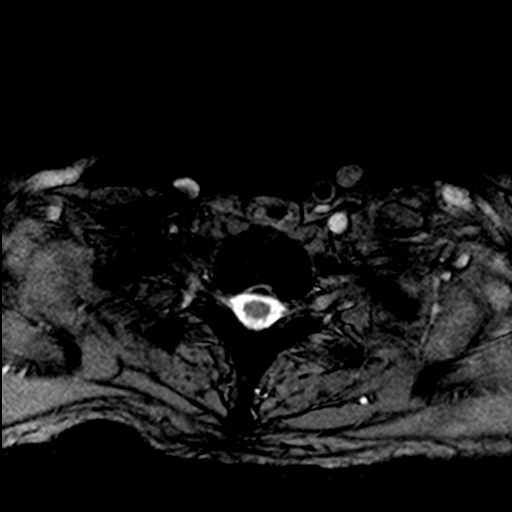
[im 5/31]
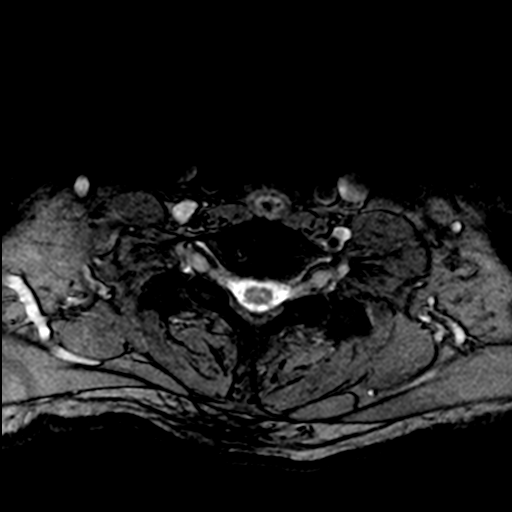

[Series 6: T2 · axial · 3.0mm · 0.70mm/px · z∈[-48,+64]mm · 9 of 31 slices shown (2 of 2)]
[im 1/31]
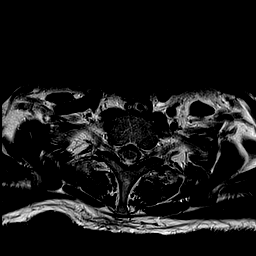
[im 5/31]
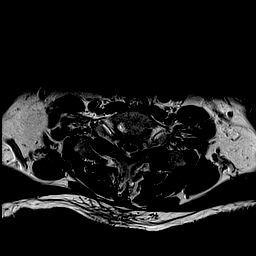
[im 9/31]
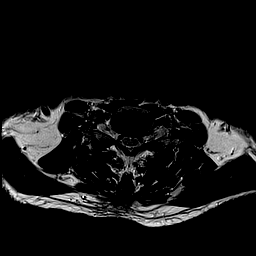
[im 13/31]
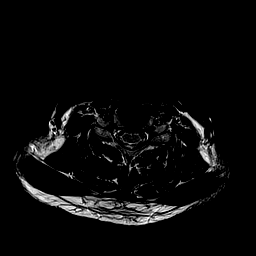
[im 16/31]
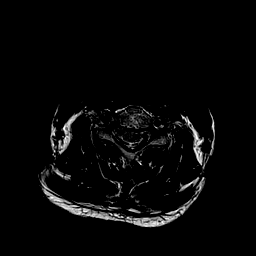
[im 18/31]
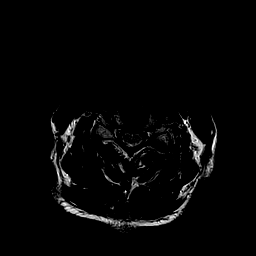
[im 22/31]
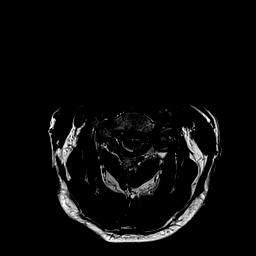
[im 26/31]
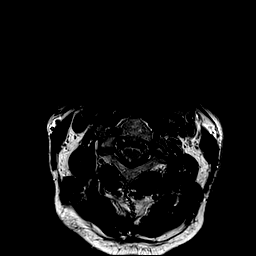
[im 31/31]
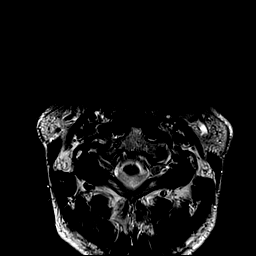

[29 of 48 positions shown; findings below may reference images not displayed]

FINDINGS: Alignment: Physiologic.

Vertebrae: No fracture, evidence of discitis, or bone lesion.

Cord: Normal signal and morphology.

Posterior Fossa, vertebral arteries, paraspinal tissues: Stable
small chronic infarction within the pons.

Disc levels:

C2-3: No significant disc displacement, foraminal stenosis, or canal
stenosis.

C3-4: No significant disc displacement, foraminal stenosis, or canal
stenosis.

C4-5: No significant disc displacement, foraminal stenosis, or canal
stenosis.

C5-6: Small central disc protrusion with ventral thecal sac
effacement. No significant foraminal or canal stenosis.

C6-7: Minimal disc bulge. No significant foraminal or canal
stenosis.

C7-T1: No significant disc displacement, foraminal stenosis, or
canal stenosis. Mild bilateral facet hypertrophy.
IMPRESSION: 1. No acute osseous or cord signal abnormality.
2. Mild discogenic degenerative changes at the C5-6 and C6-7 levels.
No significant foraminal or canal stenosis. No cord impingement.

By: Adamadam Radi M.D.

## 2019-01-29 ENCOUNTER — Encounter (HOSPITAL_COMMUNITY): Payer: Self-pay | Admitting: *Deleted

## 2019-01-29 NOTE — Progress Notes (Signed)
[employer] brought by pt's DC to be completed.  Dr Gala Romney completed and signed form, given to [facility], copy sent to MR to be scanned in

## 2019-01-30 ENCOUNTER — Ambulatory Visit: Payer: Medicare Other | Admitting: Physician Assistant

## 2019-02-10 ENCOUNTER — Ambulatory Visit: Payer: Medicare Other | Admitting: Podiatry

## 2019-02-20 ENCOUNTER — Ambulatory Visit: Payer: Medicare Other | Admitting: Endocrinology

## 2019-02-23 NOTE — Progress Notes (Signed)
   02/12/19 1800  Clinical Encounter Type  Visited With Patient and family together  Visit Type Spiritual support  Referral From Family  Responded to page for Pt. Actively dying. Met with patient, family and doctor present. Read sacred text and prayed. Provided emotional and spiritual support.

## 2019-02-23 NOTE — Progress Notes (Signed)
Advanced Heart Failure Rounding Note  PCP-Cardiologist: Nicki Guadalajara, MD   Subjective:    Has switched to comfort care. Getting intermittent doses morphine. Creatinine up to 7.4. k 5.6. Weight up 2 pounds    Sugars remain 240-360  [place] today. Nauseated. Appetite poor. Bad taste in mouth.  No urine output  Myeloma panel unremarkable.   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    Echo 12/04/2018 LVEF 50-55%, ? Restrictive physiology, Mod/Sev Functional MR, PA peak pressure 79 mm Hg, + Left pleural effusion   [facility] 02-07-19  Ost LM lesion is 85% stenosed.  Prox LAD to Mid LAD lesion is 100% stenosed.  Ost Cx to Prox Cx lesion is 100% stenosed.  Ost RCA to Dist RCA lesion is 100% stenosed.  LIMA graft was visualized by angiography and is normal in caliber.  Origin to Prox Graft lesion before 2nd Mrg is 100% stenosed.  SVG graft was visualized by angiography and is normal in caliber.  Origin to Prox Graft lesion is 100% stenosed.  LV end diastolic pressure is severely elevated.  Hemodynamic findings consistent with severe pulmonary hypertension. Hemodynamics (mmHg) RA mean 19 RV 80/11 PA 82/31 (53) PCWP 34 AO 151/70 Cardiac Output (Fick) 4.19 Cardiac Index (Fick) 2.16 MV sat 48% PVR 5 WU PAPI 2.68  Objective:   Weight Range: 77.9 kg Body mass index is 24.64 kg/m.   Vital Signs:   Temp:  [97.7 F (36.5 C)-98.8 F (37.1 C)] 97.7 F (36.5 C) (02/01 0815) Pulse Rate:  [89-116] 89 (02/01 0800) Resp:  [16-34] 22 (02/01 0800) BP: (114-138)/(69-82) 130/70 (02/01 0800) SpO2:  [87 %-95 %] 92 % (02/01 0800) Last BM Date: (PTA.)  Weight change: Filed Weights   01/21/19 0500 01/22/19 0500 01/23/19 0547  Weight: 74.6 kg 76.8 kg 77.9 kg    Intake/Output:   Intake/Output Summary (Last 24 hours) at 01/29/2019 1024 Last data filed at 01/29/2019 0800 Gross per 24 hour  Intake 878.18 ml  Output 0 ml  Net 878.18 ml      Physical Exam    General:  Elderly sitting up in bed. Weak  Lethargic.  HEENT: normal Neck: supple. JVP to ear Carotids 2+ bilat; no bruits. No lymphadenopathy or thryomegaly appreciated. Cor: PMI nondisplaced. Irr . No rubs, gallops or murmurs. Lungs: +crackles Abdomen: soft, nontender, nondistended. No hepatosplenomegaly. No bruits or masses. Good bowel sounds. Extremities: no cyanosis, clubbing, rash, 1+ edema Neuro: alert & orientedx3, cranial nerves grossly intact. moves all 4 extremities w/o difficulty. Affect pleasant  Telemetry   AF 90s Personally reviewed  EKG   No new tracings.    Labs    CBC Recent Labs    01/23/19 0443  WBC 11.1*  HGB 9.9*  HCT 31.2*  MCV 87.6  PLT 226   Basic Metabolic Panel Recent Labs    42/70/62 0550 02/17/2019 0535  NA 128* 126*  K 5.3* 5.9*  CL 92* 86*  CO2 19* 18*  GLUCOSE 374* 360*  BUN 76* 87*  CREATININE 6.28* 7.44*  CALCIUM 8.6* 8.4*   Liver Function Tests Recent Labs    01/24/19 0550  AST 20  ALT 18  ALKPHOS 88  BILITOT 0.5  PROT 6.5  ALBUMIN 3.2*   No results for input(s): LIPASE, AMYLASE in the last 72 hours. Cardiac Enzymes No results for input(s): CKTOTAL, CKMB, CKMBINDEX, TROPONINI in the last 72 hours.  BNP: BNP (last 3 results) Recent Labs  11/29/18 0858  BNP 700.9*    ProBNP (last 3 results) No results for input(s): PROBNP in the last 8760 hours.   D-Dimer No results for input(s): DDIMER in the last 72 hours. Hemoglobin A1C No results for input(s): HGBA1C in the last 72 hours. Fasting Lipid Panel No results for input(s): CHOL, HDL, LDLCALC, TRIG, CHOLHDL, LDLDIRECT in the last 72 hours. Thyroid Function Tests Recent Labs    01/24/19 0550  TSH 5.455*    Other results:   Imaging    No results found.   Medications:     Scheduled Medications: . aspirin EC  81 mg Oral Daily  . gabapentin  100 mg Oral QHS  . insulin aspart  0-15 Units Subcutaneous TID WC  . insulin  aspart  3 Units Subcutaneous TID WC  . insulin glargine  26 Units Subcutaneous QHS  . isosorbide mononitrate  60 mg Oral Daily    Infusions: . sodium chloride Stopped (01/30/2019 0534)  . amiodarone 60 mg/hr (01/30/2019 0800)    PRN Medications: sodium chloride, acetaminophen, glycopyrrolate, midazolam, morphine injection, ondansetron (ZOFRAN) IV, sodium chloride flush, traMADol   Patient Profile   Henry [name]. is a 78 y.o. male with PMH of type 1 DM, HTN, HLD, PAD, and CAD.  Admitted from cath lab with acute on chronic diastolic CHF, pulmonary hypertension, and moderately low cardiac output. CHF team asked to consult for further evaluation and treatment.   Assessment/Plan   1. Acute on chronic diastolic CHF with restrictive physiology -> cardiogenic shock 2. Afib RVR - on amiodarone gtt. 3. AKI now with uremia - refusing HD 4. CAD s/p CABG - Cath as above. 2/4 grafts patent. No interventional targets.  5. 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  - No change. He is now comfort care.  6. DM type I - Insulin dependent.  7. Hyperkalemia - In setting of ARF.  He is actively dying with uremia and end-stage restrictive CM. Once family arrives, will start comfort drips once family arrives. Suspect he will pass today.   CRITICAL CARE Performed by: Arvilla Meres  Total critical care time: 35 minutes  Critical care time was exclusive of separately billable procedures and treating other patients.  Critical care was necessary to treat or prevent imminent or life-threatening deterioration.  Critical care was time spent personally by me (independent of midlevel providers or residents) on the following activities: development of treatment plan with patient and/or surrogate as well as nursing, discussions with consultants, evaluation of patient's response to treatment, examination of patient, obtaining history from patient or surrogate, ordering and performing treatments and  interventions, ordering and review of laboratory studies, ordering and review of radiographic studies, pulse oximetry and re-evaluation of patient's condition.    Length of Stay: 5  Arvilla Meres, MD  02/11/2019, 10:24 AM  Advanced Heart Failure Team Pager [phone] (M-F; 7a - 4p)  Please contact [facility] for night-coverage after hours (4p -7a ) and weekends on [email or web address]

## 2019-02-23 NOTE — Progress Notes (Signed)
Versed and Morphine gtts started with boluses at approximately 1700h.  Patient appears comfortable / respirations even.   Will provide maximum support for family and patient, both emotionally, and physically.

## 2019-02-23 NOTE — Progress Notes (Signed)
Patient given MSO4 IV for dyspnea with good effect.  States his greatest source of discomfort was his increasing nausea, dyspnea, and pain.  Palliative care NP contacted for suggestions for increasing comfort.  Family very supportive.

## 2019-02-23 NOTE — Progress Notes (Signed)
Respirations ceased.  Patient pulseless.  Family present and spiritual / emotional support provided.  Morphine and Versed IV gtts wasted in waste container and witnessed by Bea LauraJenna Bullins, RN.

## 2019-02-23 NOTE — Progress Notes (Signed)
   Patient more dyspneic and diaphoretics throughout the day. Says he is uncomfortable and requesting to be made comfortable.   Family in room and in agreement with comfort care.   I discussed what the process of starting comfort drips would look like and patient wants to be sure that he will not wake up and suffer as this is what he is most worried about.   Palliative care team help appreciated.   Additional CCT 30 mins.  Arvilla Meres, MD  4:33 PM

## 2019-02-23 NOTE — Death Summary Note (Addendum)
  Advanced Heart Failure Death Summary  Death Summary   Patient ID: Henry [name]. MRN: [id number], DOB/AGE: 78-Jan-1942 78 y.o. 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 Cardiogenic shock 3. Afib RVR 4. Acute renal failure 5. CAD s/p CABG 6. Severe MR  7. DM typeI 8. Hyperkalemia  Hospital Course:   Henry [name]. was a 78 y.o. male with a complex medical history including a restrictive cardiomyopathy, Type 1 diabetes, HTN, HLD, PAD, severe Mitral regurgitation, and CAD s/p CABG.  Admitted from cath lab 12/26/2018 with acute on chronic diastolic CHF and PAH due to severe restrictive CM. Cath also showed moderately low cardiac output.   Pt was started on milrinone and taken to ICU. PYP scan suggestive of TTR-amyloid, but thought negative on review by CHF team. Myeloma panel was unremarkable.   Attempts at diuresis were complicated by worsening renal function. Initially, thought to be secondary to contrast-induced nephropathy +/- low output CHF. Renal function continued to worsen daily. Goals of care were discussed at length with the patient and his family. Renal initially had been consulted, but patient refused HD, with the knowledge he was not a candidate for long term HD.   Course additionally complicated by hyperglycemia in the setting of DM1. Diabetes coordinator input appreciated.   After discussions 01/23/2019, pt and family decided to move towards comfort care.  He became increasingly fatigued and uremic throughout the weekend. Pt then became more dyspneic throughout the day February 22, 2019. Comfort measures started per patient request, on which he quickly passed away.   Duration of Discharge Encounter: Greater than 35 minutes   Signed, Graciella Freer, PA-C 01/27/2019, 7:19 AM   Agree with above.  Arvilla Meres, MD  1:20 PM

## 2019-02-23 DEATH — deceased

## 2019-02-26 ENCOUNTER — Telehealth (HOSPITAL_COMMUNITY): Payer: Self-pay

## 2019-02-26 NOTE — Telephone Encounter (Signed)
Death cert signed by DB mailed to [facility] Dept attn: Vital records [address] [address] [place] [address]

## 2019-04-23 ENCOUNTER — Encounter (HOSPITAL_COMMUNITY): Payer: Medicare Other
